# Patient Record
Sex: Female | Born: 1945 | Race: White | Hispanic: No | Marital: Married | Smoking: Former smoker
Health system: Southern US, Community
[De-identification: ages and names within clinical notes are randomized; demographics above are authoritative.]

## PROBLEM LIST (undated history)

## (undated) DIAGNOSIS — Z9911 Dependence on respirator [ventilator] status: Secondary | ICD-10-CM

## (undated) DIAGNOSIS — H409 Unspecified glaucoma: Secondary | ICD-10-CM

## (undated) DIAGNOSIS — J4489 Other specified chronic obstructive pulmonary disease: Secondary | ICD-10-CM

## (undated) DIAGNOSIS — Z8709 Personal history of other diseases of the respiratory system: Secondary | ICD-10-CM

## (undated) DIAGNOSIS — R0602 Shortness of breath: Secondary | ICD-10-CM

## (undated) DIAGNOSIS — I10 Essential (primary) hypertension: Secondary | ICD-10-CM

## (undated) DIAGNOSIS — J449 Chronic obstructive pulmonary disease, unspecified: Secondary | ICD-10-CM

## (undated) DIAGNOSIS — I4819 Other persistent atrial fibrillation: Secondary | ICD-10-CM

## (undated) DIAGNOSIS — H3530 Unspecified macular degeneration: Secondary | ICD-10-CM

## (undated) DIAGNOSIS — J189 Pneumonia, unspecified organism: Secondary | ICD-10-CM

## (undated) HISTORY — DX: Chronic obstructive pulmonary disease, unspecified: J44.9

## (undated) HISTORY — DX: Essential (primary) hypertension: I10

## (undated) HISTORY — DX: Other specified chronic obstructive pulmonary disease: J44.89

## (undated) HISTORY — DX: Other persistent atrial fibrillation: I48.19

---

## 1969-05-22 HISTORY — PX: DILATION AND CURETTAGE OF UTERUS: SHX78

## 1976-09-21 HISTORY — PX: VAGINAL HYSTERECTOMY: SUR661

## 1984-09-21 HISTORY — PX: OOPHORECTOMY: SHX86

## 1999-06-09 ENCOUNTER — Other Ambulatory Visit: Admission: RE | Admit: 1999-06-09 | Discharge: 1999-06-09 | Payer: Self-pay | Admitting: *Deleted

## 2000-08-16 ENCOUNTER — Other Ambulatory Visit: Admission: RE | Admit: 2000-08-16 | Discharge: 2000-08-16 | Payer: Self-pay | Admitting: *Deleted

## 2001-11-23 ENCOUNTER — Other Ambulatory Visit: Admission: RE | Admit: 2001-11-23 | Discharge: 2001-11-23 | Payer: Self-pay | Admitting: *Deleted

## 2003-03-19 ENCOUNTER — Other Ambulatory Visit: Admission: RE | Admit: 2003-03-19 | Discharge: 2003-03-19 | Payer: Self-pay | Admitting: *Deleted

## 2003-04-16 ENCOUNTER — Inpatient Hospital Stay (HOSPITAL_COMMUNITY): Admission: EM | Admit: 2003-04-16 | Discharge: 2003-04-27 | Payer: Self-pay | Admitting: Emergency Medicine

## 2003-04-16 ENCOUNTER — Encounter: Payer: Self-pay | Admitting: Emergency Medicine

## 2003-04-17 ENCOUNTER — Encounter (INDEPENDENT_AMBULATORY_CARE_PROVIDER_SITE_OTHER): Payer: Self-pay | Admitting: *Deleted

## 2003-04-20 ENCOUNTER — Encounter: Payer: Self-pay | Admitting: Thoracic Surgery

## 2003-04-20 ENCOUNTER — Encounter (INDEPENDENT_AMBULATORY_CARE_PROVIDER_SITE_OTHER): Payer: Self-pay | Admitting: Specialist

## 2003-04-24 ENCOUNTER — Encounter: Payer: Self-pay | Admitting: Pulmonary Disease

## 2004-03-25 ENCOUNTER — Other Ambulatory Visit: Admission: RE | Admit: 2004-03-25 | Discharge: 2004-03-25 | Payer: Self-pay | Admitting: *Deleted

## 2004-12-18 ENCOUNTER — Ambulatory Visit: Payer: Self-pay | Admitting: Critical Care Medicine

## 2004-12-23 ENCOUNTER — Ambulatory Visit: Payer: Self-pay | Admitting: Internal Medicine

## 2005-02-24 ENCOUNTER — Ambulatory Visit: Payer: Self-pay | Admitting: Internal Medicine

## 2005-07-06 ENCOUNTER — Ambulatory Visit: Payer: Self-pay | Admitting: Internal Medicine

## 2005-11-03 ENCOUNTER — Ambulatory Visit: Payer: Self-pay | Admitting: Internal Medicine

## 2006-05-04 ENCOUNTER — Ambulatory Visit: Payer: Self-pay | Admitting: Internal Medicine

## 2006-11-11 ENCOUNTER — Ambulatory Visit: Payer: Self-pay | Admitting: Internal Medicine

## 2007-03-23 ENCOUNTER — Ambulatory Visit: Payer: Self-pay | Admitting: Internal Medicine

## 2007-03-31 ENCOUNTER — Ambulatory Visit: Payer: Self-pay | Admitting: Internal Medicine

## 2007-04-03 ENCOUNTER — Ambulatory Visit: Payer: Self-pay | Admitting: Cardiovascular Disease

## 2007-04-03 ENCOUNTER — Ambulatory Visit: Payer: Self-pay | Admitting: Cardiology

## 2007-04-03 ENCOUNTER — Inpatient Hospital Stay (HOSPITAL_COMMUNITY): Admission: EM | Admit: 2007-04-03 | Discharge: 2007-04-11 | Payer: Self-pay | Admitting: Emergency Medicine

## 2007-04-03 ENCOUNTER — Ambulatory Visit: Payer: Self-pay | Admitting: Internal Medicine

## 2007-04-06 ENCOUNTER — Encounter (INDEPENDENT_AMBULATORY_CARE_PROVIDER_SITE_OTHER): Payer: Self-pay | Admitting: *Deleted

## 2007-04-06 ENCOUNTER — Encounter: Payer: Self-pay | Admitting: Internal Medicine

## 2007-04-18 ENCOUNTER — Ambulatory Visit: Payer: Self-pay | Admitting: Internal Medicine

## 2007-06-17 ENCOUNTER — Ambulatory Visit: Payer: Self-pay | Admitting: Internal Medicine

## 2007-09-05 ENCOUNTER — Telehealth: Payer: Self-pay | Admitting: Internal Medicine

## 2007-09-09 ENCOUNTER — Telehealth: Payer: Self-pay | Admitting: Internal Medicine

## 2007-09-12 ENCOUNTER — Ambulatory Visit: Payer: Self-pay | Admitting: Internal Medicine

## 2007-09-12 DIAGNOSIS — F17201 Nicotine dependence, unspecified, in remission: Secondary | ICD-10-CM

## 2007-09-12 DIAGNOSIS — J449 Chronic obstructive pulmonary disease, unspecified: Secondary | ICD-10-CM

## 2007-10-14 DIAGNOSIS — J9611 Chronic respiratory failure with hypoxia: Secondary | ICD-10-CM | POA: Insufficient documentation

## 2007-10-14 DIAGNOSIS — R942 Abnormal results of pulmonary function studies: Secondary | ICD-10-CM

## 2007-10-17 ENCOUNTER — Ambulatory Visit: Payer: Self-pay | Admitting: Internal Medicine

## 2008-01-08 ENCOUNTER — Emergency Department (HOSPITAL_COMMUNITY): Admission: EM | Admit: 2008-01-08 | Discharge: 2008-01-08 | Payer: Self-pay | Admitting: Family Medicine

## 2008-01-09 ENCOUNTER — Telehealth (INDEPENDENT_AMBULATORY_CARE_PROVIDER_SITE_OTHER): Payer: Self-pay | Admitting: *Deleted

## 2008-02-08 ENCOUNTER — Ambulatory Visit: Payer: Self-pay | Admitting: Internal Medicine

## 2008-03-24 ENCOUNTER — Telehealth: Payer: Self-pay | Admitting: Internal Medicine

## 2008-08-10 ENCOUNTER — Ambulatory Visit: Payer: Self-pay | Admitting: Internal Medicine

## 2008-09-10 ENCOUNTER — Telehealth (INDEPENDENT_AMBULATORY_CARE_PROVIDER_SITE_OTHER): Payer: Self-pay | Admitting: *Deleted

## 2008-10-19 ENCOUNTER — Encounter: Admission: RE | Admit: 2008-10-19 | Discharge: 2008-10-19 | Payer: Self-pay | Admitting: Family Medicine

## 2008-11-27 ENCOUNTER — Ambulatory Visit: Payer: Self-pay | Admitting: Internal Medicine

## 2008-12-06 IMAGING — CR DG CHEST 2V
2 series · 2 of 2 positions shown · non-contrast
Comparison: 05/04/06.

CLINICAL DATA: COPD.
 CHEST ? 2 VIEW:

[view not recorded (1 of 2)]
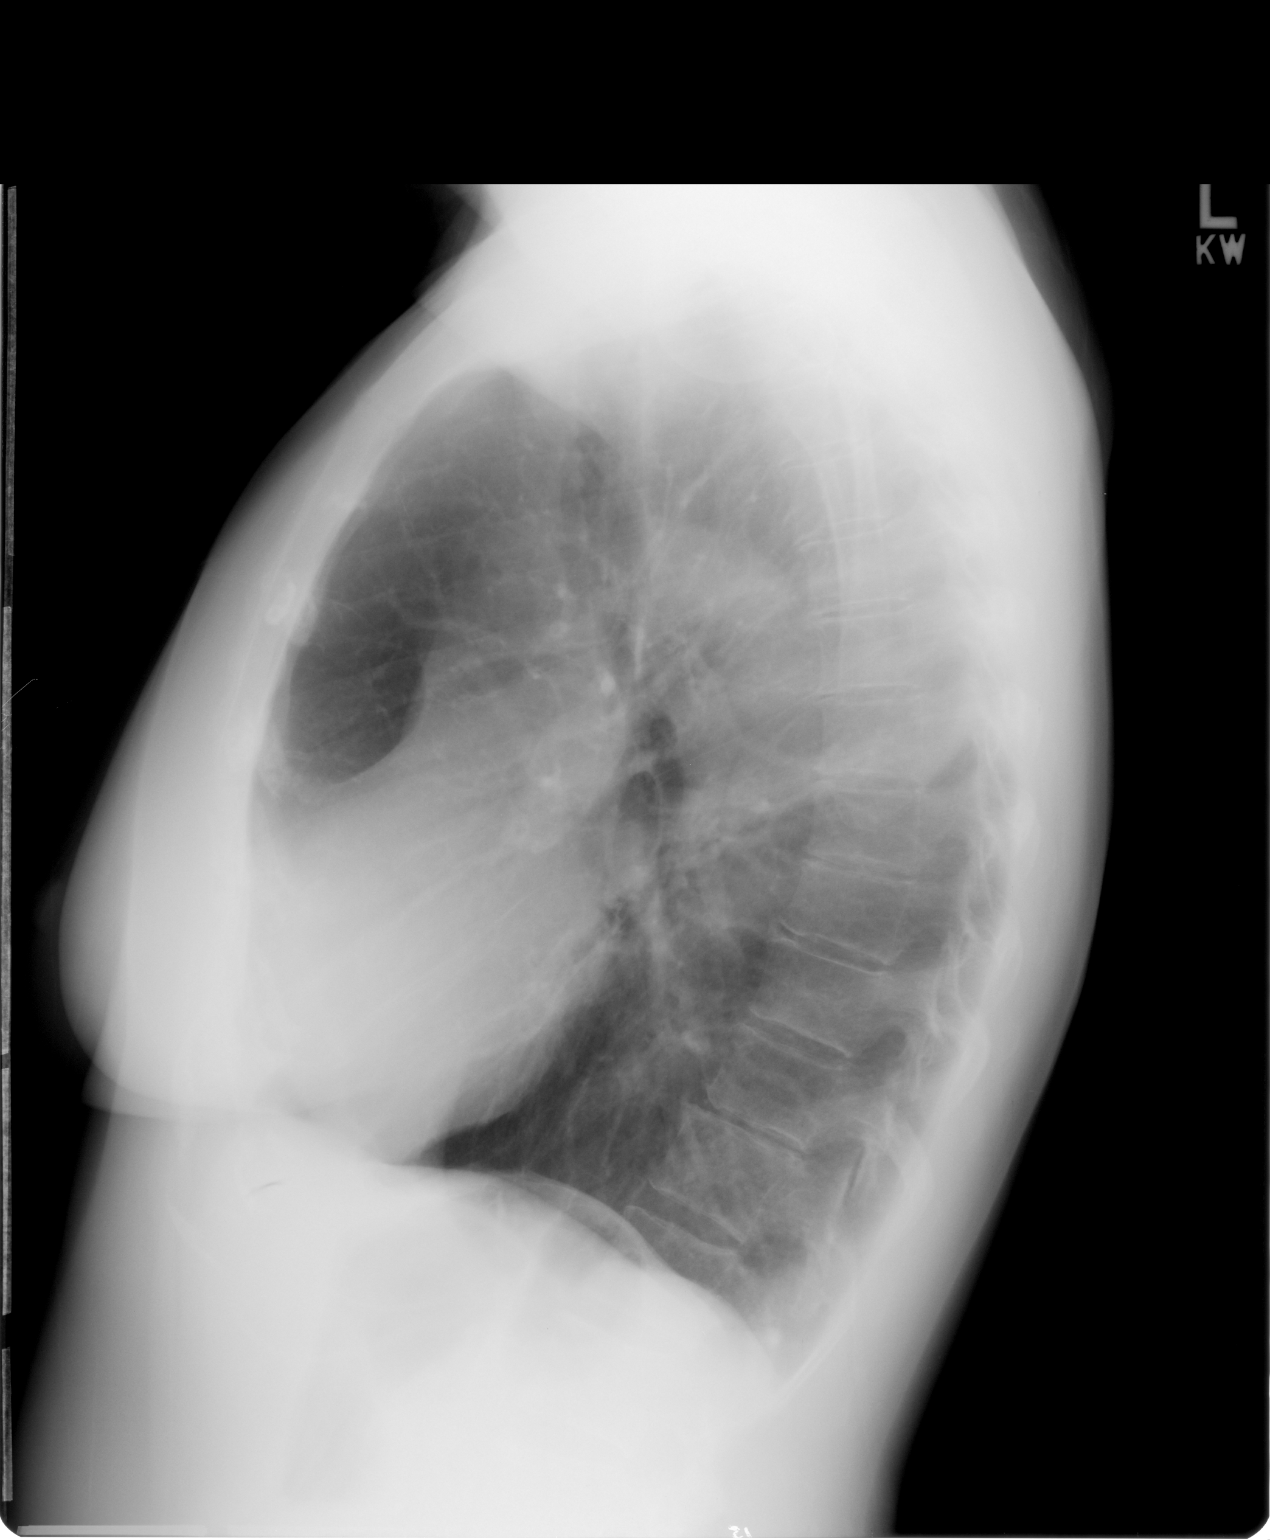

[view not recorded (2 of 2)]
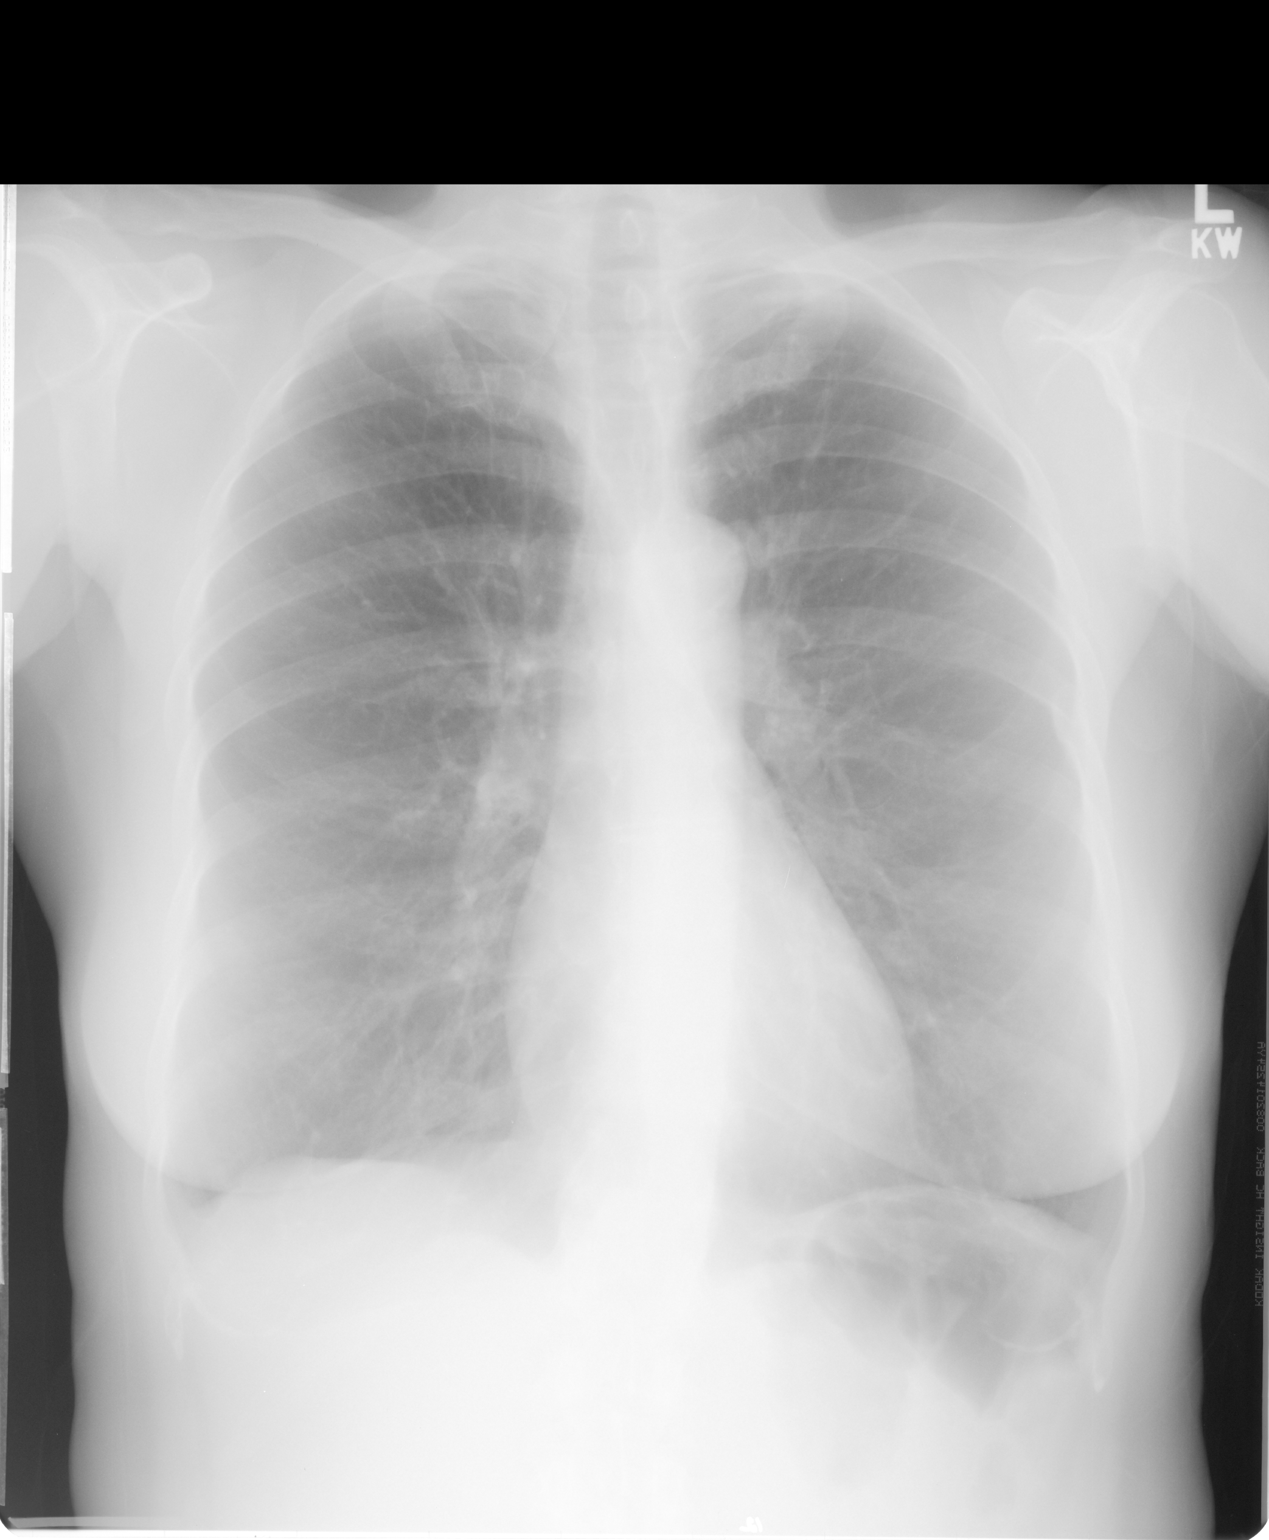

[2 of 2 positions shown; findings below may reference images not displayed]

FINDINGS: Stable changes of COPD.  No active process.  Heart is normal.  Old healed left seventh rib fracture again noted.
IMPRESSION: COPD ? no active disease.

## 2009-02-08 ENCOUNTER — Ambulatory Visit: Payer: Self-pay | Admitting: Internal Medicine

## 2009-02-08 LAB — CONVERTED CEMR LAB
Basophils Absolute: 0 10*3/uL (ref 0.0–0.1)
Eosinophils Absolute: 0.3 10*3/uL (ref 0.0–0.7)
Hemoglobin: 16.7 g/dL — ABNORMAL HIGH (ref 12.0–15.0)
Lymphocytes Relative: 26.9 % (ref 12.0–46.0)
MCHC: 36 g/dL (ref 30.0–36.0)
Neutro Abs: 5.9 10*3/uL (ref 1.4–7.7)
Platelets: 245 10*3/uL (ref 150.0–400.0)
RDW: 11.4 % — ABNORMAL LOW (ref 11.5–14.6)

## 2009-02-12 ENCOUNTER — Ambulatory Visit: Payer: Self-pay | Admitting: Internal Medicine

## 2009-02-12 ENCOUNTER — Telehealth (INDEPENDENT_AMBULATORY_CARE_PROVIDER_SITE_OTHER): Payer: Self-pay | Admitting: *Deleted

## 2009-03-21 ENCOUNTER — Ambulatory Visit: Payer: Self-pay | Admitting: Internal Medicine

## 2009-03-21 DIAGNOSIS — I10 Essential (primary) hypertension: Secondary | ICD-10-CM | POA: Insufficient documentation

## 2009-06-14 ENCOUNTER — Telehealth: Payer: Self-pay | Admitting: Internal Medicine

## 2009-06-17 ENCOUNTER — Ambulatory Visit: Payer: Self-pay | Admitting: Internal Medicine

## 2009-08-03 ENCOUNTER — Ambulatory Visit: Payer: Self-pay | Admitting: Internal Medicine

## 2009-08-03 ENCOUNTER — Inpatient Hospital Stay (HOSPITAL_COMMUNITY): Admission: EM | Admit: 2009-08-03 | Discharge: 2009-08-08 | Payer: Self-pay | Admitting: Emergency Medicine

## 2009-08-08 ENCOUNTER — Telehealth: Payer: Self-pay | Admitting: Internal Medicine

## 2009-08-09 ENCOUNTER — Telehealth: Payer: Self-pay | Admitting: Internal Medicine

## 2009-08-22 ENCOUNTER — Ambulatory Visit: Payer: Self-pay | Admitting: Internal Medicine

## 2009-08-23 ENCOUNTER — Telehealth (INDEPENDENT_AMBULATORY_CARE_PROVIDER_SITE_OTHER): Payer: Self-pay | Admitting: *Deleted

## 2009-09-25 ENCOUNTER — Telehealth: Payer: Self-pay | Admitting: Internal Medicine

## 2010-02-10 ENCOUNTER — Ambulatory Visit: Payer: Self-pay | Admitting: Internal Medicine

## 2010-02-14 ENCOUNTER — Telehealth (INDEPENDENT_AMBULATORY_CARE_PROVIDER_SITE_OTHER): Payer: Self-pay | Admitting: *Deleted

## 2010-05-08 ENCOUNTER — Telehealth: Payer: Self-pay | Admitting: Internal Medicine

## 2010-05-08 ENCOUNTER — Ambulatory Visit: Payer: Self-pay | Admitting: Internal Medicine

## 2010-05-09 ENCOUNTER — Telehealth (INDEPENDENT_AMBULATORY_CARE_PROVIDER_SITE_OTHER): Payer: Self-pay | Admitting: *Deleted

## 2010-06-10 ENCOUNTER — Ambulatory Visit: Payer: Self-pay | Admitting: Internal Medicine

## 2010-08-12 ENCOUNTER — Ambulatory Visit: Payer: Self-pay | Admitting: Internal Medicine

## 2010-09-11 ENCOUNTER — Telehealth (INDEPENDENT_AMBULATORY_CARE_PROVIDER_SITE_OTHER): Payer: Self-pay | Admitting: *Deleted

## 2010-10-02 ENCOUNTER — Telehealth (INDEPENDENT_AMBULATORY_CARE_PROVIDER_SITE_OTHER): Payer: Self-pay | Admitting: *Deleted

## 2010-10-03 ENCOUNTER — Ambulatory Visit
Admission: RE | Admit: 2010-10-03 | Discharge: 2010-10-03 | Payer: Self-pay | Source: Home / Self Care | Attending: Internal Medicine | Admitting: Internal Medicine

## 2010-10-14 ENCOUNTER — Ambulatory Visit: Admit: 2010-10-14 | Payer: Self-pay | Admitting: Internal Medicine

## 2010-10-21 NOTE — Progress Notes (Signed)
Summary: rx  Phone Note Call from Patient Call back at Home Phone [phone]   Caller: Patient Call For: young Reason for Call: Talk to Nurse Summary of Call: ? bronchitis, sob, coughing up plegm, thick clear, no fever.  Can you call something in? [facility] Initial call taken by: Eugene Gavia,  May 08, 2010 1:09 PM  Follow-up for Phone Call        called and spoke with pt and she is aware  of appt today with  MW at 4:15.

## 2010-10-21 NOTE — Assessment & Plan Note (Signed)
Summary: 4 months/apc   Primary Provider/Referring Provider:  [name]  CC:  Follow up , pt was given samples of benicar and dulera by Dr. Sherene Sires, and finished and pt started back on Advair and lisinopri;.  History of Present Illness: Feb 10, 2010- COPD, tobacco Last week had fever x 2 days. Had soreness left back for a couple of days, now easing off.   Occasional spells of coughing, less than before, but more over past 2 weeks.., She took aspirin, then ibuprofen. Uses Combivent about twice daily and contnues Advair. Denies nodes, rash, GI upset, new or different leg cramps, swelling, or blood. Denies hx DVT.  May 08, 2010 ov, still smoking  The patient c/o increased SOB and cough with thick clear to white mucus x1 week. never completely better from prev ov  now much worse not responding to rescue. Pt denies any significant sore throat, dysphagia, itching, sneezing,  nasal congestion or excess secretions,  fever, chills, sweats, unintended wt loss, pleuritic or exertional cp, hempoptysis,  orthopnea pnd or leg swelling   June 10, 2010- COPD, tobacco Saw Dr Sherene Sires in my absence in August for an acute visit. He changed her off lisnopril to Benicar samples and gave her Dulera 220-5 sample. She ran out of samples and went back on Lisinopril and Advair. she kept a cough through the Benicar sample without difference, but continued to smoke. She does notice and care about the cough. She feels better as she gets over the acute bronchitis. She is interested in trying Benicar and Dulera longer.     Preventive Screening-Counseling & Management  Alcohol-Tobacco     Smoking Status: current     Smoking Cessation Counseling: yes     Smoke Cessation Stage: precontemplative     Packs/Day: 1.0     Tobacco Counseling: to quit use of tobacco products  Current Medications (verified): 1)  Adult Aspirin Low Strength 81 Mg  Tbdp (Aspirin) .... Take 1 By Mouth Once Daily 2)  Celexa 20 Mg Tabs  (Citalopram Hydrobromide) .... Take 1 By Mouth Once Daily 3)  Combivent 103-18 Mcg/act Aero (Ipratropium-Albuterol) .... 2 Puffs Four Times A Day As Needed 4)  Lisinopril 20 Mg Tabs (Lisinopril) .Marland Kitchen.. 1 Once Daily 5)  Advair Diskus 250-50 Mcg/dose Aepb (Fluticasone-Salmeterol) .Marland Kitchen.. 1 Puff Two Times A Day  Allergies (verified): No Known Drug Allergies  Past History:  Past Medical History: Last updated: 05/08/2010 C O P D    - HFA 75% p coaching May 08, 2010  Bronchoscopy 04/06/07 for lavage of mucus plugs Acute ventilator dependent respiratory failure- copd exacerbation- 04/03/07 Hypertension   - Try off ACE May 08, 2010   Past Surgical History: Last updated: 10/17/2007 Hysterectomy Oophorectomy  Family History: Last updated: 02/28/2008 Mother liver cancer deceased Father emphysema deceased  Social History: Last updated: 06/17/2009 Patient is a current smoker.  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  The patient denies shortness of breath at rest, productive cough, coughing up blood, chest pain, irregular heartbeats, acid heartburn, indigestion, loss of appetite, weight change, abdominal pain, difficulty swallowing, sore throat, tooth/dental problems, headaches, nasal congestion/difficulty breathing through nose, and sneezing.  Dry cough wakes her and is worst on first rising.  Vital Signs:  Patient profile:   65 year old female Height:      62 inches Weight:      124.6 pounds BMI:     22.87 O2 Sat:      91 % on Room air Pulse rate:   86 / minute BP sitting:   138 / 76  (left arm)  Vitals Entered By: Renold Genta RCP, LPN (June 10, 2010 1:34 PM)  O2 Flow:   Room air CC: Follow up , pt was given samples of benicar and dulera by Dr. Sherene Sires, finished and pt started back on Advair and lisinopri; Comments Medications reviewed with patient Renold Genta RCP, LPN  June 10, 2010 1:35 PM    Physical Exam  Additional Exam:  SKIN: no rash, lesions NODES: no lymphadenopathy HEENT: [place]/AT, EOM- WNL, Conjuctivae- clear, PERRLA, TM-WNL, Nose- clear, Throat- clear and wnl, Mallampati  II, minimal geographic tongue NECK: Supple w/ fair ROM, JVD- none, normal carotid impulses w/o bruits Thyroid-  No stridor CHEST: distant w/ very minor wheeze across midback. 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 She continues to smoke. We again addressed the lisinopril issue and will let her try again with samples of Dulera and Benicar. Dr Gerri Spore treated for hyponatremia and changed Lisinopril HCT to plain Lisinopril. Flu vax discussed.  Problem # 2:  TOBACCO USER (ICD-305.1)  Encouraged to quit. She watched her husband die of tobacco disease, but can't make the step herself. Will remain supportive.  Orders: Est. Patient Level III (28413)  Medications Added to Medication List This Visit: 1)  Lisinopril 20 Mg Tabs (Lisinopril) .Marland Kitchen.. 1 once daily 2)  Advair Diskus 250-50 Mcg/dose Aepb (Fluticasone-salmeterol) .Marland Kitchen.. 1 puff two times a day  Other Orders: Flu Vaccine 70yrs + MEDICARE PATIENTS (K4401) Administration Flu vaccine - MCR (U2725)  Patient Instructions: 1)  Please schedule a follow-up appointment in 2 months. 2)  Try again to replace Lisinopril with Benicar 20 mg, 1 daily x 4 weeks. see if you notice a fall-off in the dry cough. 3)  Try again sample Dulera 200-5, 2 puffs and rinse mouth, twice daily. 4)  Flu vax     Flu Vaccine  Consent Questions     Do you have a history of severe allergic reactions to this vaccine? no    Any prior history of allergic reactions to egg and/or gelatin? no    Do you have a sensitivity to the preservative Thimersol? no    Do you have a past history of Guillan-Barre Syndrome? no    Do you currently have an acute febrile illness? no    Have you ever had a severe reaction to latex? no    Vaccine information given and explained to patient? yes    Are you currently pregnant? no    Lot Number:[id number]   Exp Date:03/21/2011   Site Given  Left Deltoid IMedflu  Boone Master CNA/MA  June 10, 2010 2:20 PM

## 2010-10-21 NOTE — Assessment & Plan Note (Signed)
Summary: rov 2 months///kp   Primary Provider/Referring Provider:  [name]  CC:  2 month follow up visit-COPD; using inhalers but still having SOB at times..  History of Present Illness: May 08, 2010 ov, still smoking  The patient c/o increased SOB and cough with thick clear to white mucus x1 week. never completely better from prev ov  now much worse not responding to rescue. Pt denies any significant sore throat, dysphagia, itching, sneezing,  nasal congestion or excess secretions,  fever, chills, sweats, unintended wt loss, pleuritic or exertional cp, hempoptysis,  orthopnea pnd or leg swelling   June 10, 2010- COPD, tobacco Saw Dr Sherene Sires in my absence in August for an acute visit. He changed her off lisnopril to Benicar samples and gave her Dulera 220-5 sample. She ran out of samples and went back on Lisinopril and Advair. she kept a cough through the Benicar sample without difference, but continued to smoke. She does notice and care about the cough. She feels better as she gets over the acute bronchitis. She is interested in trying Benicar and Dulera longer.   August 12, 2010- COPD, tobacco Nurse-CC: 2 month follow up visit-COPD; using inhalers but still having SOB at times. Morning cough with phlegm. Uses her Combivent about twice daily. She isn't sure Advair helps much- Dulera was no better. Combivent helps better than anything. Neb treatments don't help much.    Preventive Screening-Counseling & Management  Alcohol-Tobacco     Smoking Status: current     Smoking Cessation Counseling: yes     Smoke Cessation Stage: precontemplative     Packs/Day: 1.0     Year Started: 1975     Tobacco Counseling: to quit use of tobacco products  Current Medications (verified): 1)  Adult Aspirin Low Strength 81 Mg  Tbdp (Aspirin) .... Take 1 By Mouth Once Daily 2)  Combivent 103-18 Mcg/act Aero (Ipratropium-Albuterol) .... 2 Puffs Four Times A Day As Needed 3)  Lisinopril 20 Mg  Tabs (Lisinopril) .Marland Kitchen.. 1 Once Daily 4)  Advair Diskus 250-50 Mcg/dose Aepb (Fluticasone-Salmeterol) .Marland Kitchen.. 1 Puff Two Times A Day  Allergies (verified): No Known Drug Allergies  Past History:  Past Surgical History: Last updated: 10/17/2007 Hysterectomy Oophorectomy  Family History: Last updated: 02/11/08 Mother liver cancer deceased Father emphysema deceased  Social History: Last updated: 06/17/2009 Patient is a current smoker.  Husband was a smoker died - lung cancer She works as Lobbyist for a Customer service manager  Risk Factors: Smoking Status: current (08/12/2010) Packs/Day: 1.0 (08/12/2010)  Past Medical History: C Erskin Burnet D    - HFA 75% p coaching May 08, 2010  Bronchoscopy 04/06/07 for lavage of mucus plugs Acute ventilator dependent respiratory failure- copd exacerbation- 04/03/07 Hypertension   - Try off ACE May 08, 2010  Glaucoma  Review of Systems      See HPI       The patient complains of shortness of breath with activity and non-productive cough.  The patient denies shortness of breath at rest, productive cough, coughing up blood, chest pain, irregular heartbeats, acid heartburn, indigestion, loss of appetite, weight change, abdominal pain, difficulty swallowing, sore throat, tooth/dental problems, headaches, nasal congestion/difficulty breathing through nose, and sneezing.    Vital Signs:  Patient profile:   65 year old female Height:      62 inches Weight:      126.25 pounds BMI:     23.17 O2 Sat:      92 % on Room air Pulse rate:  99 / minute BP sitting:   122 / 74  (left arm) Cuff size:   regular  Vitals Entered By: Reynaldo Minium CMA (August 12, 2010 1:37 PM)  O2 Flow:  Room air CC: 2 month follow up visit-COPD; using inhalers but still having SOB at times.   Physical Exam  Additional Exam:  SKIN: no rash, lesions NODES: no lymphadenopathy HEENT: [place]/AT, EOM- WNL, Conjuctivae- clear, PERRLA, TM-WNL, Nose- clear, Throat- clear and wnl,  Mallampati  II,  NECK: Supple w/ fair ROM, JVD- none, normal carotid impulses w/o bruits Thyroid-  No stridor CHEST: distant , unlabored. Initial cough on arrival cleared with sips of water.  HEART: RRR, no m/g/r heard ABDOMEN trim ZOX:WRUE, nl pulses, no edema  NEURO: Grossly intact to observation      Impression & Recommendations:  Problem # 1:  C O P D (ICD-496) We are refilling xanax for anxiety. We will try oral albuterol tabs and we talked about this in detail. I hope we can get better drug delivery going through her bloodstream at times, to reach distal airways.   Problem # 2:  TOBACCO USER (ICD-305.1) She has not mad the effort to seriously attmept smoking cessation. We have discussed this again.  Problem # 3:  CT, CHEST, ABNORMAL (ICD-794.2)  Discussed last CXR in May. We will update.   Orders: Est. Patient Level IV (45409) T-2 View CXR (71020TC)  Medications Added to Medication List This Visit: 1)  Albuterol Sulfate 2 Mg Tabs (Albuterol sulfate) .Marland Kitchen.. 1 three times a day as needed 2)  Alprazolam 0.5 Mg Tabs (Alprazolam) .Marland Kitchen.. 1 three times a day as needed nervousness  Patient Instructions: 1)  Please schedule a follow-up appointment in 2 months. 2)  Try albuterol tabs as an extra help when chest is tight and breathing bad. 3)  Scripts for albuterol tabs and for alprazolam/ xanax 4)  A chest x-ray has been recommended.  Your imaging study may require preauthorization.  Prescriptions: ALPRAZOLAM 0.5 MG TABS (ALPRAZOLAM) 1 three times a day as needed nervousness  #50 x 5   Entered and Authorized by:   Waymon Budge MD   Signed by:   Waymon Budge MD on 08/12/2010   Method used:   Print then Give to Patient   RxID:   [id number] ALBUTEROL SULFATE 2 MG TABS (ALBUTEROL SULFATE) 1 three times a day as needed  #20 x prn   Entered and Authorized by:   Waymon Budge MD   Signed by:   Waymon Budge MD on 08/12/2010   Method used:   Print then Give to Patient    RxID:   [id number]

## 2010-10-21 NOTE — Assessment & Plan Note (Signed)
Summary: Pulmonary/ acute ov with hfa 75% p coaching   Visit Type:  Acute visit Primary Provider/Referring Provider:  [name]  CC:  Acute visit.  Dr. Maple Hudson patient.  The patient c/o increased SOB and cough with thick clear to white mucus x1 week.Marland Kitchen  History of Present Illness: 03/21/09- COPD, tobacco After last here, prednisone, antibiotics- she has done much better. Some nights her cough keeps her awake. Mostly dry cough without fever, chest pain, palpitation. Using combivent 2-3 x/ daily along with Advair. Has been on Lisinopril/ HCT  this brand since the winter We discussed substitution trial pending her return to Dr Gerri Spore. Very easily dyspneic with exertion.  06/17/09- COPD, tobacco Acute visit. Began nasal congestion and nose blowing 6 days ago. Progressive dyspnea over next few days.  Taking a sjower smothers her.Maybe one day of fever. Blowing out mostly clear from nose and chest. We called in augmentin and prednisone 3 days ago- still on those.   August 22, 2009- COPD, tobacco Post hosp f/u. Hosp for copd exacerb 11/13-11/18. She had some pneumonia cleared on cxr f/u. Discharged on Avelox, prednisone taper, and she was given some oxygen. She uses the oxygen still but off and on.  Hasn't smoked since hosp. Had flu vax. Back at work. CXR/CT had shown small area of apparent pneumonia sup seg LLL. On f/u cxr 11/17, just before discharge was no longer seen.  Feb 10, 2010- COPD, tobacco Last week had fever x 2 days. Had soreness left back for a couple of days, now easing off.   Occasional spells of coughing, less than before, but more over past 2 weeks.., She took aspirin, then ibuprofen. Uses Combivent about twice daily and contnues Advair. Denies nodes, rash, GI upset, new or different leg cramps, swelling, or blood. Denies hx DVT.  May 08, 2010 ov, still smoking  The patient c/o increased SOB and cough with thick clear to white mucus x1 week. never completely better from  prev ov  now much worse not responding to rescue. Pt denies any significant sore throat, dysphagia, itching, sneezing,  nasal congestion or excess secretions,  fever, chills, sweats, unintended wt loss, pleuritic or exertional cp, hempoptysis,  orthopnea pnd or leg swelling   Current Medications (verified): 1)  Advair Diskus 250-50 Mcg/dose  Misc (Fluticasone-Salmeterol) .... Inhale 1 Puff Two Times A Day 2)  Adult Aspirin Low Strength 81 Mg  Tbdp (Aspirin) .... Take 1 By Mouth Once Daily 3)  Combivent 103-18 Mcg/act Aero (Ipratropium-Albuterol) .... 2 Puffs Four Times A Day As Needed 4)  Lisinopril-Hydrochlorothiazide 20-12.5 Mg Tabs (Lisinopril-Hydrochlorothiazide) .... Take 1 By Mouth Once Daily 5)  Celexa 20 Mg Tabs (Citalopram Hydrobromide) .... Take 1 By Mouth Once Daily  Allergies (verified): No Known Drug Allergies  Past History:  Past Medical History: C Erskin Burnet D    - HFA 75% p coaching May 08, 2010  Bronchoscopy 04/06/07 for lavage of mucus plugs Acute ventilator dependent respiratory failure- copd exacerbation- 04/03/07 Hypertension   - Try off ACE May 08, 2010   Vital Signs:  Patient profile:   65 year old female Height:      62 inches (157.48 cm) Weight:      120.38 pounds (54.72 kg) BMI:     22.10 O2 Sat:      92 % on Room air Temp:     98.2 degrees F (36.78 degrees C) oral Pulse rate:   95 / minute BP sitting:   124 / 72  (  left arm) Cuff size:   regular  Vitals Entered By: Michel Bickers CMA (May 08, 2010 4:29 PM)  O2 Sat at Rest %:  92 O2 Flow:  Room air CC: Acute visit.  Dr. Maple Hudson patient.  The patient c/o increased SOB and cough with thick clear to white mucus x1 week. Comments Medications reviewed. Daytime phone verified. Michel Bickers CMA  May 08, 2010 4:29 PM   Physical Exam  Additional Exam:  amb anxious wf with congested sounding upper airway cough and classic pseudowheeze resolves with purse lip maneuver  SKIN: no rash, lesions NODES: no  lymphadenopathy HEENT: [place]/AT, EOM- WNL, Conjuctivae- clear, PERRLA, TM-WNL, Nose- clear, Throat- clear and wnl, Mallampati  II,  NECK: Supple w/ fair ROM, JVD- none, normal carotid impulses w/o bruits Thyroid-  No stridor CHEST: bilateral exp rhonchi with lots of transmitted upper airway "wheezing" HEART: RRR, no m/g/r heard ABDOMEN VHQ:IONG, nl pulses, no edema  NEURO: Grossly intact to observation      Impression & Recommendations:  Problem # 1:  C O P D (ICD-496) Never cleared after last ov now worse on ace and advair refractory to combivent   DDX of  difficult airways managment all start with A and  include Adherence, Ace Inhibitors, Acid Reflux, Active Sinus Disease, Alpha 1 Antitripsin deficiency, Anxiety masquerading as Airways dz,  ABPA,  allergy(esp in young), Aspiration (esp in elderly), Adverse effects of DPI,  Active smokers, plus one B  = Beta blocker use..    ? Ace see #2 ? Adverse effect of DPI > try  hfa.  I spent extra time with the patient today explaining optimal mdi  technique.  This improved from 50-75% so try dulera 200 x 1 month sample ?Active  Smoking see #3 ?Acid reflux: diet reviewed  Problem # 2:  HYPERTENSION (ICD-401.9)  . The following medications were removed from the medication list:    Lisinopril-hydrochlorothiazide 20-12.5 Mg Tabs (Lisinopril-hydrochlorothiazide) .Marland Kitchen... Take 1 by mouth once daily Her updated medication list for this problem includes:    Benicar 20 Mg Tabs (Olmesartan medoxomil) ..... One tablet by mouth daily   ACE inhibitors are problematic in  pts with airway complaints because  even experienced pulmonologists can't always distinguish ace effects from copd/asthma.  By themselves they don't actually cause a problem, much like oxygen can't by itself start a fire, but they certainly serve as a powerful catalyst or enhancer for any "fire"  or inflammatory process in the upper airway, be it caused by an ET  tube or more commonly reflux  (especially in the obese or pts with known GERD or who are on biphoshonates).  In the era of ARB near equivalency until we have a better handle on the reversibility of the airway problem, it just makes sense to avoid ace entirely in the short run and then decide later, having established a level of airway control using a reasonable limited regimen, whether to add back ace but even then being very careful to observe the pt for worsening airway control and number of meds used/ needed to control symptoms.    Orders: Est. Patient Level IV (29528)  Problem # 3:  TOBACCO USER (ICD-305.1)   I emphasized that although we never turn away smokers from the pulmonary clinic, we do ask that they understand that the recommendations that were made won't work nearly as well in the presence of continued cigarette exposure and we may reach a point where we can't help the patient if he/she  can't quit smoking.     I took this opportunity to emphasize to the pt  the consequences of smoking in airway disorders based on all the data we have from the multiple national lung health studies indicating that smoking cessation, not choice of inhalers or physicians, is the most important aspect of care.    Orders: Est. Patient Level IV (08657)  Medications Added to Medication List This Visit: 1)  Benicar 20 Mg Tabs (Olmesartan medoxomil) .... One tablet by mouth daily 2)  Dulera 200-5 Mcg/act Aero (Mometasone furo-formoterol fum) .... 2 puffs first thing  in am and 2 puffs again in pm about 12 hours later  Patient Instructions: 1)  Prednisone 4 each am x 2days, 2x2days, 1x2days and stop  2)  Stop advair 3)  Stop Lisnopril 4)  Dulera 200 2 puffs first thing  in am and 2 puffs again in pm about 12 hours later  5)  GERD (REFLUX)  is a common cause of respiratory symptoms. It commonly presents without heartburn and can be treated with medication, but also with lifestyle changes including avoidance of late meals, excessive  alcohol, smoking cessation, and avoid fatty foods, chocolate, peppermint, colas, red wine, and acidic juices such as orange juice. NO MINT OR MENTHOL PRODUCTS SO NO COUGH DROPS  6)  USE SUGARLESS CANDY INSTEAD (jolley ranchers/  7)  NO OIL BASED VITAMINS  8)  See Dr Maple Hudson in 4 weeks to check your response to these medication changes and PFT's  9)  NEEDS CXR ON RETURN 55  Appended Document: Pulmonary/ acute ov with hfa 75% p coaching Chart reviewed after she left indicating cxr needed to f/u last ov - see if she can come in for this today if not be sure one is done at the next ov  Appended Document: Pulmonary/ acute ov with hfa 75% p coaching called and spoke with pt and pt states she would like to wait until she comes back ins eptember to have a cxr done with her apt with Dr. Maple Hudson.

## 2010-10-21 NOTE — Progress Notes (Signed)
Summary: Prednisone x 10 mg x 14 pills taper off  Phone Note Call from Patient   Caller: Patient Call For: WERT Summary of Call: WOULD LIKE PREDNISONE CALLED TO PHARMACY FOR COUGH AND SOB [facility] Initial call taken by: Rickard Patience,  May 09, 2010 3:19 PM  Follow-up for Phone Call        Dr. Sherene Sires, per the OV note from yesterday patient is to have a pred taper but it was not sent.   would just like to verifiy the dosing.  prednisone 10mg  4 each am x 2days, 2x2days, 1x2days and stop, #14 no refills.  thanks! Follow-up by: Boone Master CNA/MA,  May 09, 2010 3:27 PM  Additional Follow-up for Phone Call Additional follow up Details #1::        that is correct Additional Follow-up by: Nyoka Cowden MD,  May 09, 2010 3:36 PM    Additional Follow-up for Phone Call Additional follow up Details #2::    RX sent and Mindy is making pt aware.Reynaldo Minium CMA  May 09, 2010 3:54 PM   New/Updated Medications: PREDNISONE 10 MG TABS (PREDNISONE) take 4 each am x 2 days, 2 each am x 2 days, 1 each am x 2 days Prescriptions: PREDNISONE 10 MG TABS (PREDNISONE) take 4 each am x 2 days, 2 each am x 2 days, 1 each am x 2 days  #14 x 0   Entered by:   Reynaldo Minium CMA   Authorized by:   Nyoka Cowden MD   Signed by:   Reynaldo Minium CMA on 05/09/2010   Method used:   Electronically to        [facility]  [phone]* (retail)       [address]       [place], [place]  [address]       Ph: [phone] or [phone]       Fax: [phone]   RxID:   [id number]

## 2010-10-21 NOTE — Progress Notes (Signed)
Summary: sick-LMCTB  Phone Note Call from Patient Call back at Work Phone [phone] Call back at [phone]   Caller: Patient Call For: Perfecto Purdy Summary of Call: pt is wanting a antibodic called in for a cold she just got out of the hosp 11/18 [facility] [address] Initial call taken by: Lacinda Axon,  September 25, 2009 12:16 PM  Follow-up for Phone Call        [facility]. Carron Curie CMA  September 25, 2009 12:39 PM  Pt c/o nasal congestion, sinus pressure, productive cough with yellow phlegm x 2 days. Please advise. Allergies: NKDA.Carron Curie CMA  September 25, 2009 3:11 PM   called and spoke with pt  and she is aware per CY that we will send into her pharmacy. Randell Loop CMA  September 25, 2009 3:54 PM     New/Updated Medications: DOXYCYCLINE HYCLATE 100 MG TABS (DOXYCYCLINE HYCLATE) take 2 tablets by mouth today then 1 daily until gone. Prescriptions: DOXYCYCLINE HYCLATE 100 MG TABS (DOXYCYCLINE HYCLATE) take 2 tablets by mouth today then 1 daily until gone.  #8 x 0   Entered by:   Randell Loop CMA   Authorized by:   Waymon Budge MD   Signed by:   Randell Loop CMA on 09/25/2009   Method used:   Electronically to        [facility]  [phone]* (retail)       [address]       [place], [place]  [address]       Ph: [phone] or [phone]       Fax: [phone]   RxID:   [id number]

## 2010-10-21 NOTE — Progress Notes (Signed)
Summary: cxr results- possible PNA, tx with zithromax  Phone Note Call from Patient   Caller: Patient Call For: young Summary of Call: calling for xray results Initial call taken by: Rickard Patience,  Feb 14, 2010 11:02 AM  Follow-up for Phone Call        CXR- possible pneumonia in left lung.  Recommend- Go ahead and take the Z pak script I gave yesterday. If not feeling better in a few days be sure to let me know.   Signed by Waymon Budge MD on 02/11/2010 at 9:08 AM  The above is append on cxr.  ATC pt x 2 and line was busy, WCB Vernie Murders  Feb 14, 2010 11:18 AM   Additional Follow-up for Phone Call Additional follow up Details #1::        Spoke with pt and advised of the above results/recs per Dr Maple Hudson.  Pt verbalized understanding and will go ahead and get zpack. Additional Follow-up by: Vernie Murders,  Feb 14, 2010 11:21 AM

## 2010-10-21 NOTE — Assessment & Plan Note (Signed)
Summary: rov 6 months///kp   Primary Provider/Referring Provider:  Carolin Ibarra  CC:  6 month follow up visit-COPD; "breathing okay and have good and bad days lately".Fever couple times last week and pain in Left upper back since Tuesday last week-pain with deep breathing. Marland Kitchen  History of Present Illness: 03/21/09- COPD, tobacco After last here, prednisone, antibiotics- she has done much better. Some nights her cough keeps her awake. Mostly dry cough without fever, chest pain, palpitation. Using combivent 2-3 x/ daily along with Advair. Has been on Lisinopril/ HCT  this brand since the winter We discussed substitution trial pending her return to Dr Gerri Spore. Very easily dyspneic with exertion.  06/17/09- COPD, tobacco Acute visit. Began nasal congestion and nose blowing 6 days ago. Progressive dyspnea over next few days.  Taking a sjower smothers her.Maybe one day of fever. Blowing out mostly clear from nose and chest. We called in augmentin and prednisone 3 days ago- still on those.   August 22, 2009- COPD, tobacco Post hosp f/u. Hosp for copd exacerb 11/13-11/18. She had some pneumonia cleared on cxr f/u. Discharged on Avelox, prednisone taper, and she was given some oxygen. She uses the oxygen still but off and on.  Hasn't smoked since hosp. Had flu vax. Back at work. CXR/CT had shown small area of apparent pneumonia sup seg LLL. On f/u cxr 11/17, just before discharge was no longer seen.  Feb 10, 2010- COPD, tobacco Last week had fever x 2 days. Had soreness left back for a couple of days, now easing off.   Occasional spells of coughing, less than before, but more over past 2 weeks.., She took aspirin, then ibuprofen. Uses Combivent about twice daily and contnues Advair. Denies nodes, rash, GI upset, new or different leg cramps, swelling, or blood. Denies hx DVT.  Current Medications (verified): 1)  Advair Diskus 250-50 Mcg/dose  Misc (Fluticasone-Salmeterol) .... Inhale 1 Puff Two  Times A Day 2)  Adult Aspirin Low Strength 81 Mg  Tbdp (Aspirin) .... Take 1 By Mouth Once Daily 3)  Combivent 103-18 Mcg/act Aero (Ipratropium-Albuterol) .... 2 Puffs Four Times A Day As Needed 4)  Lisinopril-Hydrochlorothiazide 20-12.5 Mg Tabs (Lisinopril-Hydrochlorothiazide) .... Take 1 By Mouth Once Daily 5)  Celexa 20 Mg Tabs (Citalopram Hydrobromide) .... Take 1 By Mouth Once Daily  Allergies (verified): No Known Drug Allergies  Past History:  Past Medical History: Last updated: 03/21/2009 C O P D Bronchoscopy 7/16/08for lavage of mucus plugs Acute ventilator dependent respiratory failure- copd exacerbation- 04/03/07 Hypertension  Past Surgical History: Last updated: 10/17/2007 Hysterectomy Oophorectomy  Family History: Last updated: 2008-02-10 Mother liver cancer deceased Father emphysema deceased  Social History: Last updated: 06/17/2009 Patient is a current smoker.  Husband was a smoker died - lung cancer She works as Lobbyist for a Customer service manager  Risk Factors: Smoking Status: current (03/21/2009) Packs/Day: 0.5 (03/21/2009)  Review of Systems      See HPI       The patient complains of shortness of breath with activity and productive cough.  The patient denies shortness of breath at rest, non-productive cough, coughing up blood, chest pain, irregular heartbeats, acid heartburn, indigestion, loss of appetite, weight change, abdominal pain, difficulty swallowing, sore throat, tooth/dental problems, headaches, nasal congestion/difficulty breathing through nose, and sneezing.    Vital Signs:  Patient profile:   66 year old female Height:      62 inches Weight:      125 pounds BMI:     22.95 O2  Sat:      95 % on Room air Pulse rate:   84 / minute BP sitting:   118 / 66  (left arm) Cuff size:   regular  Vitals Entered By: Reynaldo Minium CMA (Feb 10, 2010 2:27 PM)  O2 Flow:  Room air  Physical Exam  Additional Exam:  General: A/Ox3; pleasant and  cooperative, NAD, Resting saturation is 97%. SKIN: no rash, lesions NODES: no lymphadenopathy HEENT: [place]/AT, EOM- WNL, Conjuctivae- clear, PERRLA, TM-WNL, Nose- clear, Throat- clear and wnl, Mallampati  II,  NECK: Supple w/ fair ROM, JVD- none, normal carotid impulses w/o bruits Thyroid-  No stridor CHEST:clear, quiet and unlabored today. Mild shock tenederness left infrascapular area with no rales, rub or dullness HEART: RRR, no m/g/r heard ABDOMEN EAV:WUJW, nl pulses, no edema  NEURO: Grossly intact to observation      Impression & Recommendations:  Problem # 1:  C O P D (ICD-496) Acute bronchitis exacerbation- nonspecific. She will continue ibuprofen and heating pad as needed. We will give Z pak to hold. Tussive musculoskeletal back pain. Plan CXR  Problem # 2:  TOBACCO USER (ICD-305.1)  Averaging still about a pack per day. Encouraged again to stop. The following medications were removed from the medication list:    Nicotine 21 Mg/24hr Pt24 (Nicotine) .Marland Kitchen... Transderm daily, remove at night  Medications Added to Medication List This Visit: 1)  Lisinopril-hydrochlorothiazide 20-12.5 Mg Tabs (Lisinopril-hydrochlorothiazide) .... Take 1 by mouth once daily 2)  Zithromax Z-pak 250 Mg Tabs (Azithromycin) .... 2 today then one daily  Other Orders: Est. Patient Level IV (11914) Tobacco use cessation intermediate 3-10 minutes (78295) Prescription Created Electronically [phone]) T-2 View CXR (71020TC)  Patient Instructions: 1)  Please schedule a follow-up appointment in 4 months. 2)  A chest x-ray has been recommended.  Your imaging study may require preauthorization.  3)  heating pad and ibuprofen as needed for back pain 4)  Script to hold for Z pak in case you need antibiotic 5)  Refills sent for Advair and Combivent 6)  Please try to stop smoking. Make that your present to yourself. Prescriptions: ADVAIR DISKUS 250-50 MCG/DOSE  MISC (FLUTICASONE-SALMETEROL) Inhale 1 puff two  times a day  #60 Each x prn   Entered and Authorized by:   Waymon Budge MD   Signed by:   Waymon Budge MD on 02/10/2010   Method used:   Electronically to        [facility]  [phone]* (retail)       [address]       [place], [place]  [address]       Ph: [phone] or [phone]       Fax: [phone]   RxID:   [id number] COMBIVENT 103-18 MCG/ACT AERO (IPRATROPIUM-ALBUTEROL) 2 puffs four times a day as needed  #1 x prn   Entered and Authorized by:   Waymon Budge MD   Signed by:   Waymon Budge MD on 02/10/2010   Method used:   Electronically to        [facility]  [phone]* (retail)       [address]       [place], [place]  [address]       Ph: [phone] or [phone]       Fax: [phone]   RxID:   [id number] ZITHROMAX Z-PAK 250 MG  TABS (AZITHROMYCIN) 2 today then one daily  #1 pak x 0   Entered and Authorized by:   Waymon Budge MD   Signed by:   Waymon Budge MD on 02/10/2010   Method used:   Print then Give to Patient   RxID:   [id number]

## 2010-10-23 NOTE — Assessment & Plan Note (Signed)
Summary: acute visit/kcw   Primary Provider/Referring Provider:  [name]  CC:  Acute visit0-cough, Increased SOB, and was having some right side Back pain.Marland Kitchen  History of Present Illness: June 10, 2010- COPD, tobacco Saw Dr Sherene Sires in my absence in August for an acute visit. He changed her off lisnopril to Benicar samples and gave her Dulera 220-5 sample. She ran out of samples and went back on Lisinopril and Advair. she kept a cough through the Benicar sample without difference, but continued to smoke. She does notice and care about the cough. She feels better as she gets over the acute bronchitis. She is interested in trying Benicar and Dulera longer.   August 12, 2010- COPD, tobacco Nurse-CC: 2 month follow up visit-COPD; using inhalers but still having SOB at times. Morning cough with phlegm. Uses her Combivent about twice daily. She isn't sure Advair helps much- Dulera was no better. Combivent helps better than anything. Neb treatments don't help much.   October 03, 2010- COPD, tobacco Nurse-CC: Acute visit0-cough, Increased SOB, was having some right side Back pain. CXR 07/2010 showed clear COPD w/ reesolution of earlier pneumonia. We called in Z pak late December Acute visit- Starting 6 days ago malaise, fever, cough. Went to [facility]. Got Ceftin , Hycodan CS. Fever stopped 4 days ago. Cough remains very productive thick white, short of breath. Started prednisone taper 10/02/10. Sleeping in recliner. Had right sided back pain, now gone. Took mucinex 2 days.    Preventive Screening-Counseling & Management  Alcohol-Tobacco     Smoking Status: current     Smoking Cessation Counseling: yes     Smoke Cessation Stage: precontemplative     Packs/Day: 1.0     Year Started: 1975     Tobacco Counseling: to quit use of tobacco products  Current Medications (verified): 1)  Adult Aspirin Low Strength 81 Mg  Tbdp (Aspirin) .... Take 1 By Mouth Once Daily 2)  Combivent  103-18 Mcg/act Aero (Ipratropium-Albuterol) .... 2 Puffs Four Times A Day As Needed 3)  Lisinopril 20 Mg Tabs (Lisinopril) .Marland Kitchen.. 1 Once Daily 4)  Advair Diskus 250-50 Mcg/dose Aepb (Fluticasone-Salmeterol) .Marland Kitchen.. 1 Puff Two Times A Day 5)  Alprazolam 0.5 Mg Tabs (Alprazolam) .Marland Kitchen.. 1 Three Times A Day As Needed Nervousness 6)  Prednisone 10 Mg Tabs (Prednisone) .... Take 4 Tablets X 2 Days, 3 Tablets X 2 Days, 2 Tablets X 2days, 1 Tablet X 2 Days, Then Stop  Allergies (verified): No Known Drug Allergies  Past History:  Past Medical History: Last updated: 08/12/2010 C O P D    - HFA 75% p coaching May 08, 2010  Bronchoscopy 04/06/07 for lavage of mucus plugs Acute ventilator dependent respiratory failure- copd exacerbation- 04/03/07 Hypertension   - Try off ACE May 08, 2010  Glaucoma  Past Surgical History: Last updated: 10/17/2007 Hysterectomy Oophorectomy  Family History: Last updated: 22-Feb-2008 Mother liver cancer deceased Father emphysema deceased  Social History: Last updated: 10/03/2010 Patient is a current smoker.  Husband was a smoker died - lung cancer Retired  Lobbyist for a Customer service manager  Risk Factors: Smoking Status: current (10/03/2010) Packs/Day: 1.0 (10/03/2010)  Social History: Patient is a current smoker.  Husband was a smoker died - lung cancer Retired  Lobbyist for a Customer service manager  Review of Systems      See HPI       The patient complains of shortness of breath with activity, productive cough, and chest pain.  The patient denies shortness of  breath at rest, non-productive cough, coughing up blood, irregular heartbeats, acid heartburn, indigestion, loss of appetite, weight change, abdominal pain, difficulty swallowing, sore throat, tooth/dental problems, headaches, nasal congestion/difficulty breathing through nose, and sneezing.    Vital Signs:  Patient profile:   65 year old female Height:      62 inches Weight:      123.25  pounds BMI:     22.62 O2 Sat:      97 % on Room air Pulse rate:   86 / minute BP sitting:   138 / 88  (left arm) Cuff size:   regular  Vitals Entered By: Reynaldo Minium CMA (October 03, 2010 11:10 AM)  O2 Flow:  Room air CC: Acute visit0-cough, Increased SOB, was having some right side Back pain.   Physical Exam  Additional Exam:  SKIN: no rash, lesions NODES: no lymphadenopathy HEENT: [place]/AT, EOM- WNL, Conjuctivae- clear, PERRLA, TM-WNL, Nose- clear, Throat- clear and wnl, Mallampati  II,  NECK: Supple w/ fair ROM, JVD- none, normal carotid impulses w/o bruits Thyroid-  No stridor CHEST: distant , unlabored. Initial cough on arrival cleared with sips of water.  HEART: RRR, no m/g/r heard ABDOMEN trim ZOX:WRUE, nl pulses, no edema  NEURO: Grossly intact to observation      Impression & Recommendations:  Problem # 1:  C O P D (ICD-496) Acute exacerbation Suggested continue pred taper  Add doxy while finsishing the ceftin  neb today  Consider home neb  Problem # 2:  TOBACCO USER (ICD-305.1) We again took the opportunity to push her for some effort at smoking cessation.  Medications Added to Medication List This Visit: 1)  Doxycycline Hyclate 100 Mg Caps (Doxycycline hyclate) .... 2 today then one daily  Other Orders: Est. Patient Level III (45409) Albuterol Sulfate Sol 1mg  unit dose (W1191) Nebulizer Tx (47829)  Patient Instructions: 1)  Keep appointment as scheduled, but call sooner as needed.  2)  Script sent for doxycycline antibiotic. Start this on top of the cefuroxime antibiotic you are already taking. Suggest you take them after meals.  3)  neb a today 4)  Finish prednisone taper 5)  Lots of fluids and rest as much as you can.  Prescriptions: DOXYCYCLINE HYCLATE 100 MG CAPS (DOXYCYCLINE HYCLATE) 2 today then one daily  #8 x 0   Entered and Authorized by:   Waymon Budge MD   Signed by:   Waymon Budge MD on 10/03/2010   Method used:   Electronically  to        [facility]  [phone]* (retail)       [address]       [place], [place]  [address]       Ph: [phone] or [phone]       Fax: [phone]   RxID:   [id number]      Medication Administration  Medication # 1:    Medication: Albuterol Sulfate Sol 1mg  unit dose    Diagnosis: C O P D (ICD-496)    Dose: 1 vial    Route: inhaled    Exp Date: 02/13    Lot #: [id number]    Mfr: [employer]    Patient tolerated medication without complications    Given by: Zackery Barefoot CMA (October 03, 2010 12:07 PM)  Orders Added: 1)  Est. Patient Level III [84166] 2)  Albuterol Sulfate Sol 1mg  unit dose [A6301] 3)  Nebulizer Tx [  94640]  

## 2010-10-23 NOTE — Progress Notes (Signed)
Summary: requesting abx asap----rx for z pak   Phone Note Call from Patient Call back at [facility] Phone [phone] Call back at DR. YOUNG   Caller: Patient Summary of Call: Patient phoned and wanted to know if Dr. Maple Hudson would call her an antibiotic into [facility] [address] [phone]. She is wheezing and coughing up mucas. Patient can be reached at [phone] Initial call taken by: Vedia Coffer,  September 11, 2010 9:15 AM  Follow-up for Phone Call        Called, spoke with pt.  c/o wheezing, prod cough with thick white mucus and some yellow mixed in, some chest tightness, and increased SOB at rest and with activity.  Denies f/c/s.  [facility] [address] nkda  Requesting abx ASAP.  Dr. Maple Hudson, pls advise.  Thanks! Follow-up by: Gweneth Dimitri RN,  September 11, 2010 11:16 AM  Additional Follow-up for Phone Call Additional follow up Details #1::        per Cy, offer z pak.  Marland KitchenMarland KitchenMarland KitchenAundra Millet Reynolds LPN  September 11, 2010 11:38 AM   called and spoke with pt.  pt aware rx sent to pharmacy. Marland KitchenArman Filter LPN  September 11, 2010 11:39 AM     New/Updated Medications: ZITHROMAX Z-PAK 250 MG TABS (AZITHROMYCIN) take as directed Prescriptions: ZITHROMAX Z-PAK 250 MG TABS (AZITHROMYCIN) take as directed  #1 x 0   Entered by:   Arman Filter LPN   Authorized by:   Waymon Budge MD   Signed by:   Arman Filter LPN on 57/84/6962   Method used:   Electronically to        [facility]  [phone]* (retail)       [address]       [place], [place]  [address]       Ph: [phone] or [phone]       Fax: [phone]   RxID:   [id number]

## 2010-10-23 NOTE — Progress Notes (Signed)
Summary: appointmnet today  Phone Note Call from Patient Call back at Home Phone [phone]   Caller: Patient Call For: Dr. Maple Hudson Summary of Call: Patient phoned and thinks that she may have pneumonia she went to the [facility] clinic on Saturday and was given an antibiotic but she isn't any better. She is having a hard time breathing. She doesn't have a fever but doesn't think the antibiotic is doing any good. She would like to be seen today. She can be reached [phone] Initial call taken by: Vedia Coffer,  October 02, 2010 9:02 AM  Follow-up for Phone Call        Pt seen at [facility] clinic on Sat 09-27-10.  Given Ceftin 500 mg two times a day and Hycodan cough syrup.  Pt still c/o constant cough with thick white sputum, increased SOB, back hurting on right side, diff sleeping due to cough.  Fever is gone now but was running 103.  Not taking Mucinex or Hycodan now because she did not want to mask any symptoms becaused she has been hospitalized for mucus plugs in the past.  Please advise. Abigail Miyamoto RN  October 02, 2010 9:48 AM   Additional Follow-up for Phone Call Additional follow up Details #1::        Per CDY-worked in on Friday 10-03-10 at 1045am for acute vist and called Prednisone 10mg  #20 4 x 2days, 3 x 2 days, 2 x 2days, 1 x 2 days then stop no refills to [facility] [address] at patients request.Katie [facility] CMA  October 02, 2010 11:50 AM     New/Updated Medications: PREDNISONE 10 MG TABS (PREDNISONE) take 4 tablets x 2 days, 3 tablets x 2 days, 2 tablets x 2days, 1 tablet x 2 days, then stop Prescriptions: PREDNISONE 10 MG TABS (PREDNISONE) take 4 tablets x 2 days, 3 tablets x 2 days, 2 tablets x 2days, 1 tablet x 2 days, then stop  #20 x 0   Entered by:   Reynaldo Minium CMA   Authorized by:   Waymon Budge MD   Signed by:   Reynaldo Minium CMA on 10/02/2010   Method used:   Electronically to        [facility]  [phone]* (retail)       [address]  [place], [place]  [address]       Ph: [phone] or [phone]       Fax: [phone]   RxID:   [id number]

## 2010-12-09 ENCOUNTER — Encounter: Payer: Self-pay | Admitting: Internal Medicine

## 2010-12-16 ENCOUNTER — Ambulatory Visit: Payer: Self-pay | Admitting: Internal Medicine

## 2010-12-19 ENCOUNTER — Encounter: Payer: Self-pay | Admitting: Internal Medicine

## 2010-12-19 ENCOUNTER — Ambulatory Visit (INDEPENDENT_AMBULATORY_CARE_PROVIDER_SITE_OTHER): Payer: Medicare Other | Admitting: Internal Medicine

## 2010-12-19 VITALS — BP 116/62 | HR 87 | Ht 61.0 in | Wt 119.6 lb

## 2010-12-19 DIAGNOSIS — J449 Chronic obstructive pulmonary disease, unspecified: Secondary | ICD-10-CM

## 2010-12-19 DIAGNOSIS — F17200 Nicotine dependence, unspecified, uncomplicated: Secondary | ICD-10-CM

## 2010-12-19 DIAGNOSIS — R942 Abnormal results of pulmonary function studies: Secondary | ICD-10-CM

## 2010-12-19 MED ORDER — BUDESONIDE-FORMOTEROL FUMARATE 160-4.5 MCG/ACT IN AERO: 2.0000 | INHALATION_SPRAY | Freq: Two times a day (BID) | RESPIRATORY_TRACT | Status: DC

## 2010-12-19 MED ORDER — LEVALBUTEROL HCL 0.63 MG/3ML IN NEBU
0.6300 mg | INHALATION_SOLUTION | Freq: Once | RESPIRATORY_TRACT | Status: AC
  Administered 2010-12-19: 0.63 mg via RESPIRATORY_TRACT

## 2010-12-19 MED ORDER — METHYLPREDNISOLONE ACETATE 80 MG/ML IJ SUSP
80.0000 mg | Freq: Once | INTRAMUSCULAR | Status: AC
  Administered 2010-12-19: 80 mg via INTRAMUSCULAR

## 2010-12-19 MED ORDER — CEFDINIR 300 MG PO CAPS: 300.0000 mg | ORAL_CAPSULE | Freq: Two times a day (BID) | ORAL | Status: AC

## 2010-12-19 NOTE — Patient Instructions (Addendum)
Neb xop 0.63  Depo 80  Sample and script Symbicort 160 to try instead of Advair   2 puffs and rinse mouth, twice daily  Script to hold for antibiotic cefdinir. Use this if you aren't getting better or feel that you have an infection.   [facility] Holiday representative

## 2010-12-19 NOTE — Assessment & Plan Note (Addendum)
This will be managed initially as an exacerbation of COPD with bronchitis. I am not sure the mold exposure issue fits, and would be difficult to prove. Since abatement is being done, treatment will be nonspecific anyway. Seasonal pollens may be important, suggested by time of year, but without rhinitis. She questions efficacy of Advair so I will take opportunity to try replacing with Symbicort 160. I emphasized that as long as she continues to smoke, she can expect progressively more problems with time.  CXR 08/10/10 showed COPD with resolved pneumonia. I will repeat for update if cough persists. We will need to find opportunity to get PFT.

## 2010-12-19 NOTE — Progress Notes (Signed)
  Subjective:    Patient ID: [name], female    DOB: [birth year], 65 y.o.   MRN: [id number]  HPI 39 yoF smoker followed for COPD, tobacco dependence and cough. [employer] EMR reviewed for conversion to Epic.Has had extensive efforts at smoking counseling (husband died of tobacco induced lung cancer).  Comes today with increased cough since March 2, with concerns of relation to mold abatement being done in home. Refrigerator line leaked, later floor buckled and on removal, mold found beneath flooring. Thinks she may have coughed more since recent water leak onto kitchen floor. Denies fever, sore throat or obvious infection. Denies reflux. Has been more wheezey and short of breath Using her Advair and using Combivent 3 x/ day.   Review of Systems See HPI Constitutional:   No weight loss, night sweats,  Fevers, chills, fatigue, lassitude. HEENT:   No headaches,  Difficulty swallowing,  Tooth/dental problems,  Sore throat,                Some sneezing, itching, nasal congestion, post nasal drip,   CV:  No chest pain,  Orthopnea, PND, swelling in lower extremities, anasarca, dizziness, palpitations  GI  No heartburn, indigestion, abdominal pain, nausea, vomiting, diarrhea, change in bowel habits, loss of appetite  Resp:Increased shortness of breath with exertion and at rest.  No excess mucus, no productive cough,  Increased non-productive cough,  No coughing up of blood.  No change in color of mucus.  Some wheezing.   Skin: no rash or lesions.  GU: no dysuria, change in color of urine, no urgency or frequency.  No flank pain.  MS:  No joint pain or swelling.  No decreased range of motion.  No back pain.  Psych:  No change in mood or affect. No depression or anxiety.  No memory loss.     Objective:   Physical Exam General- Alert, Oriented, Affect-appropriate, Distress- none acute  Skin- rash-none, lesions- none, excoriation- none.    Small bruises on arms  Lymphadenopathy-  none  Head- atraumatic  Eyes- Gross vision intact, PERRLA, conjunctivae clear, secretions  Ears- Normal-earing, canals, Tm L ,   R ,  Nose- Clear, No- Septal dev, mucus, polyps, erosion, perforation   Throat- Mallampati III , mucosa clear , drainage- none, tonsils- atrophic  Neck- flexible , trachea midline, no stridor , thyroid nl, carotid no bruit  Chest - symmetrical excursion , unlabored     Heart/CV- RRR , no murmur , no gallop  , no rub, nl s1 s2                     - JVD- none , edema- none, stasis changes- none, varices- none     Lung- Slow bilateral expiratory wheeze, cough- none , dullness-none, rub- none, unlabored    Chest wall-   Abd- not obese  Br/ Gen/ Rectal- Not done, not indicated  Extrem- cyanosis- none, clubbing, none, atrophy- none, strength- nl  Neuro- grossly intact to observation        Assessment & Plan:

## 2010-12-21 ENCOUNTER — Encounter: Payer: Self-pay | Admitting: Internal Medicine

## 2010-12-21 NOTE — Assessment & Plan Note (Signed)
I spoke with her again about motivation to try quitting, available support. She is not really interested in trying.

## 2010-12-24 LAB — BASIC METABOLIC PANEL
BUN: 9 mg/dL (ref 6–23)
BUN: 6 mg/dL (ref 6–23)
BUN: 8 mg/dL (ref 6–23)
CO2: 23 mEq/L (ref 19–32)
CO2: 22 mEq/L (ref 19–32)
Calcium: 8.8 mg/dL (ref 8.4–10.5)
Calcium: 9.1 mg/dL (ref 8.4–10.5)
Creatinine, Ser: 0.66 mg/dL (ref 0.4–1.2)
Creatinine, Ser: 0.56 mg/dL (ref 0.4–1.2)
GFR calc Af Amer: >60 mL/min (ref >60)
GFR calc non Af Amer: >60 mL/min (ref >60)
GFR calc non Af Amer: >60 mL/min (ref >60)
Glucose, Bld: 151 mg/dL — ABNORMAL HIGH (ref 70–99)
Glucose, Bld: 106 mg/dL — ABNORMAL HIGH (ref 70–99)
Glucose, Bld: 133 mg/dL — ABNORMAL HIGH (ref 70–99)
Potassium: 4 mEq/L (ref 3.5–5.1)
Potassium: 4.3 mEq/L (ref 3.5–5.1)
Potassium: 4.6 mEq/L (ref 3.5–5.1)
Sodium: 134 mEq/L — ABNORMAL LOW (ref 135–145)

## 2010-12-24 LAB — CBC
Hemoglobin: 15 g/dL (ref 12.0–15.0)
MCHC: 35.2 g/dL (ref 30.0–36.0)
MCHC: 35.2 g/dL (ref 30.0–36.0)
MCV: 90.8 fL (ref 78.0–100.0)
MCV: 92.3 fL (ref 78.0–100.0)
Platelets: 333 10*3/uL (ref 150–400)
Platelets: 276 10*3/uL (ref 150–400)
RBC: 4.1 MIL/uL (ref 3.87–5.11)
RBC: 4.68 MIL/uL (ref 3.87–5.11)
RDW: 12.4 % (ref 11.5–15.5)
WBC: 14.5 10*3/uL — ABNORMAL HIGH (ref 4.0–10.5)
WBC: 13.8 10*3/uL — ABNORMAL HIGH (ref 4.0–10.5)

## 2010-12-24 LAB — COMPREHENSIVE METABOLIC PANEL
ALT: 16 U/L (ref 0–35)
AST: 21 U/L (ref 0–37)
Albumin: 3.6 g/dL (ref 3.5–5.2)
Calcium: 9.1 mg/dL (ref 8.4–10.5)
Creatinine, Ser: 0.64 mg/dL (ref 0.4–1.2)
GFR calc Af Amer: >60 mL/min (ref >60)
GFR calc non Af Amer: >60 mL/min (ref >60)
Sodium: 127 mEq/L — ABNORMAL LOW (ref 135–145)
Total Protein: 6.2 g/dL (ref 6.0–8.3)

## 2010-12-24 LAB — LEGIONELLA ANTIGEN, URINE: Legionella Antigen, Urine: NEGATIVE

## 2010-12-24 LAB — DIFFERENTIAL
Basophils Absolute: 0 10*3/uL (ref 0.0–0.1)
Basophils Relative: 1 % (ref 0–1)
Eosinophils Absolute: 0 10*3/uL (ref 0.0–0.7)
Eosinophils Absolute: 0.4 10*3/uL (ref 0.0–0.7)
Eosinophils Relative: 0 % (ref 0–5)
Lymphocytes Relative: 5 % — ABNORMAL LOW (ref 12–46)
Lymphocytes Relative: 11 % — ABNORMAL LOW (ref 12–46)
Lymphs Abs: 0.7 10*3/uL (ref 0.7–4.0)
Lymphs Abs: 0.8 10*3/uL (ref 0.7–4.0)
Lymphs Abs: 1.6 10*3/uL (ref 0.7–4.0)
Monocytes Absolute: 0.2 10*3/uL (ref 0.1–1.0)
Monocytes Absolute: 1.3 10*3/uL — ABNORMAL HIGH (ref 0.1–1.0)
Monocytes Relative: 4 % (ref 3–12)
Monocytes Relative: 9 % (ref 3–12)
Neutro Abs: 13.1 10*3/uL — ABNORMAL HIGH (ref 1.7–7.7)

## 2010-12-24 LAB — SODIUM, URINE, RANDOM: Sodium, Ur: <10 mEq/L

## 2010-12-24 LAB — GLUCOSE, CAPILLARY
Glucose-Capillary: 112 mg/dL — ABNORMAL HIGH (ref 70–99)
Glucose-Capillary: 118 mg/dL — ABNORMAL HIGH (ref 70–99)
Glucose-Capillary: 120 mg/dL — ABNORMAL HIGH (ref 70–99)
Glucose-Capillary: 121 mg/dL — ABNORMAL HIGH (ref 70–99)
Glucose-Capillary: 138 mg/dL — ABNORMAL HIGH (ref 70–99)
Glucose-Capillary: 134 mg/dL — ABNORMAL HIGH (ref 70–99)
Glucose-Capillary: 153 mg/dL — ABNORMAL HIGH (ref 70–99)
Glucose-Capillary: 153 mg/dL — ABNORMAL HIGH (ref 70–99)

## 2010-12-24 LAB — OSMOLALITY, URINE: Osmolality, Ur: 129 mOsm/kg — ABNORMAL LOW (ref 390–1090)

## 2010-12-24 LAB — CULTURE, BLOOD (ROUTINE X 2): Culture: NO GROWTH

## 2010-12-24 LAB — TSH: TSH: 0.4 u[IU]/mL (ref 0.350–4.500)

## 2010-12-24 LAB — POCT CARDIAC MARKERS
CKMB, poc: 1.6 ng/mL (ref 1.0–8.0)
Myoglobin, poc: 102 ng/mL (ref 12–200)

## 2011-02-03 NOTE — Assessment & Plan Note (Signed)
[facility]                             PULMONARY OFFICE NOTE   REEGAN, MCTIGHE                     MRN:          [id number]  DATE:04/18/2007                            DOB:          1946-04-20    PROBLEM:  1. Chronic obstructive pulmonary disease.  2. Tobacco abuse.  3. History of mucus plug with respiratory failure.   HISTORY:  She was hospitalized July 13th through 21st for ventilator-  dependent respiratory failure, secondary to exacerbation of COPD and  probably again had mucus plugs.  There were some left lower lobe density  on CT scan, and she needed bronchoscopy.  She is feeling  very much  better and wants to return to work tomorrow and feels able.  Spiriva was  stopped because of the mucus plugging.  She has not smoked in two weeks.  She wants to wait on cardiology followup for dobutamine stress test,  which was suggested in hospital, because of cost.  She feels  nervous/anxious and asks help for something with sleep.  We completed  her family leave act form.   MEDICATIONS:  Discharge medications:  1. Advair 250/50, one puff b.i.d.  2. Aspirin 81 mg.  3. Mucinex.  4. Albuterol inhaler, two puffs q.i.d. p.r.n.  5. Prednisone taper over 8 days.  6. Chantix 1 mg b.i.d.  7. Stool softener p.r.n.  8. Avelox to complete hospital course, then stop.  She is to stop      taking Spiriva as noted.   OBJECTIVE:  VITAL SIGNS:  Weight 118 pounds, BP 122/82, pulse 84, room  air saturation 99%.  CHEST:  Sounds clear today, with equal breath sounds, unlabored.  HEART:  Sounds regular without murmur.  No neck vein distention or  stridor.  NECK:  No adenopathy.  EXTREMITIES:  She is trim, no ankle edema.   IMPRESSION:  Chronic obstructive pulmonary disease, status post acute  exacerbation.  She will need a follow-up CT or chest x-ray to look again  at the left lower lobe, which may have been another mucus plug.   PLAN:  She is to remain  off of cigarettes, stay on Advair 250 and off of  Spiriva.  She will follow up with cardiology when able.  Try Temazepam  15 mg h.s. p.r.n for sleep with discussion.  Schedule return 6 weeks,  earlier p.r.n.     Clinton D. Maple Hudson, MD, Tonny Bollman, FACP  Electronically Signed    CDY/MedQ  DD: 04/21/2007  DT: 04/22/2007  Job #: [id number]   cc:   Otilio Connors. Gerri Spore, M.D.

## 2011-02-03 NOTE — Assessment & Plan Note (Signed)
[name]                             PULMONARY OFFICE NOTE   Kaitlyn, Ibarra                     MRN:          [id number]  DATE:03/23/2007                            DOB:          23-Sep-1945    PULMONARY FOLLOWUP:   PROBLEM LIST:  1. Chronic obstructive pulmonary disease.  2. Tobacco abuse.  3. History of mucus plug with respiratory failure.   HISTORY:  Her husband is our patient and now starting chemotherapy for  lung cancer.  She is concerned that she has increased scratchy cough,  and she does not want to risk giving him anything.  She is using Spiriva  daily, and it does seem to help.  She used up Foradil sample but never  got the prescription filled, and we discussed use of that as an  additional bronchodilator if needed.  She denies chest pain, fever,  blood, or adenopathy.   MEDICATIONS:  Multivitamin and calcium, Spiriva, Xalatan eye drops,  Fosamax, Foradil b.i.d. p.r.n. at this point, Mucinex.   No medication allergy.   OBJECTIVE:  VITAL SIGNS:  Weight 117 pounds, BP 124/68, pulse 93, room  air saturation 96%.  LUNGS:  Congested cough, without dullness or focal findings.  LYMPHATIC:  I do not find adenopathy.  HEART SOUNDS:  Regular, without murmur.  EXTREMITIES:  There is no edema.   IMPRESSION:  Chronic obstructive pulmonary disease exacerbation.  I  suspect that this an air quality problem currently.   PLAN:  1. Heavy emphasis on smoking cessation, particularly in light of her      husband's difficulty.  2. Z-PAK to hold.  3. Depo-Medrol 80 mg IM.  4. Xopenex 1.25 mg nebulizer treatment here.  5. Chest x-ray.  6. She has an appointment in August 2008 and will keep that, earlier      p.r.n.     Clinton D. Maple Hudson, MD, Tonny Bollman, FACP  Electronically Signed    CDY/MedQ  DD: 03/23/2007  DT: 03/24/2007  Job #: [id number]   cc:   Otilio Connors. Gerri Spore, M.D.

## 2011-02-03 NOTE — Op Note (Signed)
[name], [name]              ACCOUNT NO.:  [id number]   MEDICAL RECORD NO.:  [id number]          PATIENT TYPE:  INP   LOCATION:  1230                         FACILITY:  [facility]   PHYSICIAN:  Charlaine Dalton. Sherene Sires, MD, FCCPDATE OF BIRTH:  09-04-1946   DATE OF PROCEDURE:  04/06/2007  DATE OF DISCHARGE:                               OPERATIVE REPORT   PROCEDURE:  Therapeutic bronchoscopy with lavage.   HISTORY/INDICATIONS:  Please see progress notes.   The patient underwent an emergent bronchoscopy yesterday, which showed  copious retained secretions.  She has persistent mild atelectasis on  chest CT following bronchoscopy and is a candidate for extubation today.  Prior to extubation, I want to make sure all of the major plugs had been  removed, as she had not been able to cough this up prior to intubation.   The patient agreed to the procedure, therefore, after a full discussion  of risks, benefits and alternatives.   The procedure was performed in the ICU while the patient was on 100%  FiO2 through mechanical ventilation, and the bronchoscope was introduced  through an adaptor through her present endotracheal tube.   The patient had already been sedated by nursing and did not require  additional sedation throughout the procedure and maintained adequate  saturation throughout the procedure in sinus rhythm.   FINDINGS:  1. The endotracheal tube was in excellent position, about 4 cm above      the carina.  2. There was diffuse mild airway erythema.  3. There were copious mucoid secretions present through the left upper      lobe and lower lobe, but these were suctioned easily free.  There      were no true mucus plugs or casts seen.  4. All of the right-sided airways were unremarkable except for mild      erythema.   IMPRESSION:  1. Copious retained secretions have been removed, and the patient is      now a candidate for weaning and extubation if tolerated.  2. Hypoxemic  respiratory failure secondary to mucus plugs, resolved.      Charlaine Dalton. Sherene Sires, MD, [facility]  Electronically Signed     MBW/MEDQ  D:  04/08/2007  T:  04/08/2007  Job:  [id number]

## 2011-02-03 NOTE — Discharge Summary (Signed)
NAMELOIE, JAHR              ACCOUNT NO.:  [id number]   MEDICAL RECORD NO.:  [id number]          PATIENT TYPE:  INP   LOCATION:  1324                         FACILITY:  [facility]   PHYSICIAN:  Clinton D. Maple Hudson, MD, FCCP, FACPDATE OF BIRTH:  08-05-1946   DATE OF ADMISSION:  04/03/2007  DATE OF DISCHARGE:  04/11/2007                               DISCHARGE SUMMARY   DISCHARGE DIAGNOSIS:  1. Vent dependent hypoxic respiratory failure secondary to chronic      obstructive pulmonary disease exacerbation.  2. Acute tracheobronchitis with mucociliary dysfunction secondary to      smoking.  3. Atypical chest pain with negative cardiac workup.  4. Left lower lobe pulmonary density on CT scan, will need outpatient      CT follow up in six to eight weeks.   PROCEDURES:  1. Therapeutic bronchoscopy with lavage on July 16 by Dr. Nelda Bucks and on July 17 by Dr. Sandrea Hughs.  2. Right upper extremity PICC on July 16.  3. Oral intubation on July 16 with extubation on July 17.   BRIEF HISTORY OF PRESENT ILLNESS:  This is a 65 year old white female  patient of Dr. Maple Hudson who has a known history of COPD who continues to  smoke..  The patient presented to the emergency room with a 2-week  history of progressively worsening history of increased congestion and  shortness of breath.  One hour prior to arrival to the emergency room,  the patient complained of left-sided chest pain with radiation to the  left arm.  Previously the patient had been seen on an outpatient basis;  and treated with a Z-Pak and Omnicef  without any improvement in her  symptoms.   On arrival to the emergency room initial cardiac enzymes were negative.  The patient was hypoxic with ambulation; and the patient was admitted by  Dr. Sherene Sires.   LABS:  Chest x-ray showed left basilar atelectasis.  A subsequent CT  chest scan showed mucous plugging of the left lower lobe with a  parenchymal density in the left lower  lobe.  White blood cell count was  14,500, hemoglobin 12.8, hematocrit 37.2, platelets 246,000.  Sodium  139, potassium 3.7, chloride 110, carbon dioxide 25, BUN 16, creatinine  0.64, glucose 142.  Cardiac enzymes were negative x3.  Total cholesterol  222, LDL 96, HDL 87, triglycerides 197.  TSH was 0.248.  Sputum culture  revealed no organism seen with rare fungus isolated, felt to be possible  contaminant.  Sputum Gram stain was negative.  AFB negative to date --  final culture is pending.  Fungal smear showed rare yeast with  pseudohyphae.   HOSPITAL COURSE:  The patient is a 65 year old white female who was  admitted with present worsening dyspnea with activity and left-sided  chest pain.  Cardiac enzymes were negative x3. Initial chest x-ray  showed some left basilar atelectasis with no definite infiltrate.  The  patient was started on IV steroids and nebulized bronchodilators and  oral Avelox for suspected COPD exacerbation.  The patient was seen by  [facility]  cardiology, Dr. Excell Seltzer, and felt to have noncardiac chest pain.  But due to her risk factors with smoking, she was recommended to have an  outpatient follow-up.  A 2-D echo showed a normal left ventricular  function with an ejection fraction of 60-65%.  There was possible  hypokinesis in the inferior-posterior wall with some mild diastolic  dysfunction and dilated inferior vena cava..  The patient had had some  initial improvement; however, on 07/16 the patient's respiratory status  declined with hypoxemia; and the patient required oral intubation.  CT  scan showed some mucus plugging; and the patient underwent a  bronchoscopy with lavage on July 16 by Dr. Tyson Alias; and then repeated  on July 17 by Dr. Sherene Sires.   The patient was maintained on ventilatory support and was successfully  weaned on July 17.  The patient continued to improve and maintained  normal O2 saturations on room air with ambulation.  The patient reached   maximum hospital benefit on April 11, 2007, and was transitioned over to  oral steroids at 40 mg of prednisone.  During the patient's  hospitalization she was started on Chantix for smoking cessation. The  patient was discontinued off of her anticholinergic due to her repeated  mucous plugging and will need to stop her Spiriva on discharge.   DISCHARGE MEDICATIONS:  1. Advair 250/50 b.i.d.  2. Aspirin 81 mg daily.  3. Mucinex DM 2 b.i.d.  4. Prednisone 40 mg times two days, 30 mg times two days, 20 mg times      two days, 10 mg times two days and then stop.  5. Chantix 1 mg b.i.d.  6. Stool softener as needed  7. Avelox 400 mg times one day for a total of 7 days of therapy after      her last dose is completed today.  8. As above, the patient has been instructed to STOP her Spiriva and      avoid anticholinergics, in the future, due to her mucus plugging.   DISPOSITION:  The patient has been made an appointment with Dr. Jetty Duhamel at [facility] on April 18, 2007 for a 1 week follow up at  11:10 a.m.  She is instructed to call the office sooner if symptoms  worsen after discharge.  The patient also has been recommended to call  Dr. Excell Seltzer at [facility] for followup.  I have given her the  contact information for outpatient followup in the next 2 weeks as  recommended.  The patient is to increase her activity slowly; and do not  return back to work until seen back in the office by Dr. Maple Hudson.  Also  the patient will need to be set up an outpatient basis for a CT chest  for followup of her left lower lobe density in 6-8 weeks.  This will be  taken care of on her return to the office next week with Dr. Maple Hudson on  July 28.      Rubye Oaks, NP      Clinton D. Maple Hudson, MD, Tonny Bollman, FACP  Electronically Signed    TP/MEDQ  D:  04/11/2007  T:  04/11/2007  Job:  [id number]   cc:   Joni Fears D. Maple Hudson, MD, FCCP, FACP  [facility]-Pulmonary Dept  [address]  [address]  [place]  [place] [address]

## 2011-02-03 NOTE — H&P (Signed)
[name], [name]              ACCOUNT NO.:  [id number]   MEDICAL RECORD NO.:  [id number]          PATIENT TYPE:  EMS   LOCATION:  ED                           FACILITY:  [facility]   PHYSICIAN:  Charlaine Dalton. Sherene Sires, MD, FCCPDATE OF BIRTH:  22-Nov-1945   DATE OF ADMISSION:  04/03/2007  DATE OF DISCHARGE:                              HISTORY & PHYSICAL   CHIEF COMPLAINTS:  Dyspnea and cough.   HISTORY:  This is a 65 year old white female active smoker who says at  baseline she just takes Spiriva and occasional Foradil, with minimal  cough and dyspnea only with exertion.  However, about 2 weeks ago she  began having increasing cough that felt congested but actually never  coughed up purulent sputum.  In fact, she never coughed up excess mucus  at all.  The cough worsened, despite treatment as an outpatient with  Zithromax and then Omnicef.  She never ran fever, had pleuritic pain or  sputum production, but gradually became more short of breath and started  taking Foradil more regularly, with no benefit.  She came to the  emergency room overnight with an hour and a half episode of chest  discomfort radiating to her left arm, with a negative cardiac workup and  documented hypoxemia with walking, and I was asked to admit her by the  emergency room doctors.   The patient denies any overt sinus or reflux symptoms.  She says that  none of her inhalers are doing her any good. ( I do not believe she  actually has a rescue inhaler per se.)  She was very vague about her  medicines, and it took multiple questions to get the full story about  what exactly she is taking (see comments below).   PAST MEDICAL HISTORY:  Denies any major operations or hospitalizations.   ALLERGIES:  None.   MEDICATIONS:  Omnicef, prednisone, Spiriva, Mucinex, Tussionex, and  Foradil.  She was very vague about how she took any of these medicines,  and vague and indirect about disclosing all of these medicines.  She  denies having any rescue inhalers.   SOCIAL HISTORY:  She is an active smoker, as is her husband, who says,  but I smoke outside.  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   FAMILY HISTORY:  Positive for emphysema in her father and the fact that  both her brother and sister smoke but are healthy.  The mother had  liver cancer but she never smoked.Marland Kitchen   REVIEW OF SYSTEMS:  Taken in detail. Negative except as above.   PHYSICAL EXAMINATION:  GENERAL:  This is an anxious and depressed-  appearing white female, with a hopeless, helpless affect and attitude,  who failed to answer any questions asked her in a straightforward  manner.  VITAL SIGNS:  She is afebrile, with normal vital signs.  HEENT:  Unremarkable.  Oropharynx is clear.  NECK:  Supple without cervical adenopathy or tenderness.  Trachea is  midline, with no thyromegaly.  LUNG FIELDS:  Reveal pan-expiratory rhonchi, with prominent pseudo-  wheeze as well,  and severe coughing on expiratory maneuvers.  HEART:  There was a regular rate rhythm, without murmur, gallop, or rub.  S1 and S2 were diminished.  ABDOMEN:  Soft, benign with no palpable organomegaly, mass, or  tenderness.  EXTREMITIES:  Without calf tenderness, cyanosis, clubbing, or edema.   Chest x-ray showed no infiltrates or effusions.  WBC was 24,000 (on  prednisone).  Remainder of the CBC was normal.  Troponin was negative,  and BNP was only 36.  Chemistry profile was normal, except for  nonfasting blood sugar of 177.   IMPRESSION:  1. Atypical chronic obstructive pulmonary disease exacerbation, with      hypoxemic respiratory failure.  I am not sure if she actually does      have excess mucus, as she does not appear end-stage and yet cannot      cough up any of her secretions.  I think we are dealing with excess      inflammation in the upper and lower airway rather than excess      secretions or purulent secretions at this point.  Therefore, I  am      going to hold antibiotics and treat her vigorously with around-the-      clock bronchodilators and IV Solu-Medrol.  I am also going to      optimize treatment for cough with a flutter valve and Protonix as      well as Mucinex DM.  2. Fleeting chest pain of unclear etiology.  Will do another cycle of      enzymes and a 12-lead EKG.  She had never had exertional chest pain      before but is at risk based on her smoking history, if not her      family history, for ischemic heart disease.  3. Anxiety and depression may be making it difficult to both help her      stop smoking and also manage her as an outpatient, based on have      difficulty was to extract a history of medication use from this      patient.  I suspect she is noncompliant both with medications and      obviously with the cigarette issue.      Charlaine Dalton. Sherene Sires, MD, [facility]  Electronically Signed     MBW/MEDQ  D:  04/03/2007  T:  04/04/2007  Job:  [id number]

## 2011-02-03 NOTE — Assessment & Plan Note (Signed)
[facility]                             PULMONARY OFFICE NOTE   [name], [name]                     MRN:          [id number]  DATE:06/17/2007                            DOB:          [birth year]    PROBLEM LIST:  1. Chronic obstructive pulmonary disease.  2. Tobacco abuse.  3. History of mucous plug with respiratory failure.  4. Abnormal CT.   HISTORY:  She is smoking less because she only smokes at home.  Husband  is a continual strain for her because of his bad rheumatoid arthritis  and ongoing treatment for lung cancer.  She had tried Chantix but it  caused insomnia and nightmares and was discontinued.  No phlegm, no  chest pain or palpitation.   MEDICATIONS:  1. Advair 250/50.  2. Aspirin 81 mg.  3. Occasional temazepam.   No medication allergy.  Question intolerance to [facility].   She continues Xalatan eye drops.   OBJECTIVE:  Weight 125 pounds, BP 120/78, pulse 72, room air saturation  98%.  CHEST:  Sounds quiet and clear and she does not look labored.  There is  no cough.  Heart sounds are regular without murmur.  I find no adenopathy, no edema.   Chest CT July 17 raised concern of a mucous plug in the posterior  basilar segment of the left lower lobe, and showed some parenchymal  density in the left lower lobe which was nonspecific recommending  followup.  Chest x-ray is obtained today and shows hyperinflation  without acute or superimposed abnormality.  There is an old left 7th rib  fracture, heart size normal.   IMPRESSION:  1. Chronic obstructive pulmonary disease.  2. Tobacco use.   PLAN:  Flu vaccine was discussed and given.  Smoking cessation was  reinforced.  Schedule return 4 months, earlier p.r.n.    Clinton D. Maple Hudson, MD, Tonny Bollman, FACP  Electronically Signed   CDY/MedQ  DD: 06/17/2007  DT: 06/18/2007  Job #: [id number]

## 2011-02-03 NOTE — Assessment & Plan Note (Signed)
[facility]                             PULMONARY OFFICE NOTE   LAURAL, EILAND                     MRN:          [id number]  DATE:04/11/2007                            DOB:          1946/02/19    PULMONARY OFFICE FOLLOWUP   PROBLEMS:  1. Chronic obstructive pulmonary disease.  2. Tobacco abuse.  3. History of mucus plug with respiratory failure.   HISTORY:  She had been seen July 2, but says she really has not been  able to sleep because of cough with orthopnea over the past week.  Phenergan with codeine not strong enough for cough suppression.  Hard  coughing.  Dry weather bothers her, but not enough for her to quit  smoking, unfortunately, despite our counseling.  She did not try  Mucinex.   MEDICATIONS:  Multivitamins.  Calcium.  Spiriva.  Xalatan eye drops.  Fosamax.  Foradil used occasionally.  Mucinex.   OBJECTIVE:  Weight 117 pounds, BP 112/ 68, pulse 94, room air saturation  98%.  Diminished breath sounds.  Only a little coughing on end expiration  here.  No focal findings or dullness.  Heart sounds are regular.  Best heard at the xyphoid without murmur or  gallop.  I do not find adenopathy, cyanosis, or edema.   IMPRESSION:  Chronic obstructive pulmonary disease with exacerbation.   PLAN:  1. Smoking cessation.  2. Prednisone 8-day taper with steroid talk.  Tussionex 300 ml to use      5 ml q.12h p.r.n. occasional use for cough.  3. Omnicef 300 mg b.i.d. x7 days.  4. Keep scheduled appointment, earlier p.r.n.     Clinton D. Maple Hudson, MD, Tonny Bollman, FACP  Electronically Signed    CDY/MedQ  DD: 04/11/2007  DT: 04/12/2007  Job #: [id number]   cc:   Otilio Connors. Gerri Spore, M.D.

## 2011-02-03 NOTE — Op Note (Signed)
[name], [name]              ACCOUNT NO.:  [id number]   MEDICAL RECORD NO.:  [id number]          PATIENT TYPE:  INP   LOCATION:  1230                         FACILITY:  [facility]   PHYSICIAN:  Nelda Bucks, MD DATE OF BIRTH:  10/31/1945   DATE OF PROCEDURE:  DATE OF DISCHARGE:                               OPERATIVE REPORT   PROCEDURE:  Bronchoscopy with bronchoalveolar lavage.   CONSENT:  Consent was obtained from the husband.   ESTIMATED BLOOD LOSS:  Blood loss throughout the procedure is less than  0.1 mL.   MEDICATIONS USED:  Total of 4 of Versed in divided doses, 200 mcg of  fentanyl in divided doses and 90 mg of propofol in divided doses.  Also  used 3 mL of 1% lidocaine down the endotracheal tube.   INDICATIONS FOR PROCEDURE:  Left main collapse, total left lung  collapse, refractory hypoxemia.   This was an emergency procedure secondary to hypoxemia.  The patient has  a prior history of having a white mucus plug in the left main  approximately 4 years ago taken care of by Dr. Francella Solian and Dr. Dewayne Shorter.  She now presented with similar complaints.  The PEEP was  reduced to 5 to limit mean airway pressure throughout the procedure.  The patient was saturating 84% prior to the procedure on maximal 100%  FiO2.   ENDOSCOPIST:  Nelda Bucks, M.D.   DESCRIPTION OF PROCEDURE:  The bronchoscope was introduced and the  following findings were noted:  1. Complete left main collapse from a thick white hard mucus plug from      the left main all throughout the upper division including the      lingula and left lower lobe.  2. The right lung was not instrumented on purpose to avoid      contamination if this was an infectious process.  3. Post suctioning, extensive suctioning with approximately 120 of      normal saline throughout her bronchoscopy, there were no      endobronchial lesions noted that I could see on the mucosa,      although visualization was not  perfect throughout this procedure      given the emergency and limited bronchoscopic tool.  4. Status post bronchoalveolar lavage in the left lower lobe, chosen      secondary to the extensive amount of mucous plugging noted, knowing      that yield may be low in the left lower lobe.  Then 10 mL of normal      saline was instilled in the left lower lobe and approximately after      15 seconds suctioning intermittently took place into a trap.  The      bronchoalveolar lavage specimen appeared to be clear with hard      yellow sediment mucous plugging throughout.  5. Mild bronchoscopy trauma noted throughout the upper division with      some edema noted post procedure but no active bleeding.   Post procedure the entire left main upper division and left lower lobe,  lingula  and left upper lobe were all cleared from mucous plugging.  Most  of the saline instilled, except from the BAL was all suctioned back.  I  did instill approximately 8 mL of 20% 1:1 dilution in normal saline  Mucomyst into this area and then suctioned back after approximately 1  minute.  The patient will remain on a PEEP of 8 and portable chest x-ray  is pending at this time for my review.  The patient's saturation  improved drastically after the left main mucus plug was suctioned clear  and pretty much saturated 100% throughout the procedure.  The patient  remained normotension and tolerated the propofol and instrumenting  without any significant hemodynamic compromise, as well as her heart  rate improving.  I did speak to her husband and entire family post  procedure and they are aware of the findings that I have listed here.  I  will be sending a large number of particulate, hard mucus plug material  in formalin to pathology and cytopathology, as well as sending the  bronchoalveolar lavage for Gram stain culture and sensitivity, fungal  cultures, viral cultures, AFB, cell count and differential.      Nelda Bucks, MD  Electronically Signed     DJF/MEDQ  D:  04/06/2007  T:  04/07/2007  Job:  [id number]   cc:   Ines Bloomer, M.D.  [address]  [place]  [place] [address]   Charlaine Dalton. Sherene Sires, MD, FCCP  [address]  [place] [address]

## 2011-02-03 NOTE — Consult Note (Signed)
[name], [name]              ACCOUNT NO.:  [id number]   MEDICAL RECORD NO.:  [id number]          PATIENT TYPE:  INP   LOCATION:  1230                         FACILITY:  [facility]   PHYSICIAN:  Veverly Fells. Excell Seltzer, MD  DATE OF BIRTH:  Apr 07, 1946   DATE OF CONSULTATION:  04/06/2007  DATE OF DISCHARGE:                                 CONSULTATION   REQUESTING PHYSICIAN:  Charlaine Dalton. Sherene Sires, MD, FCCP.   REASON FOR CONSULTATION:  Chest pain.   HISTORY OF PRESENT ILLNESS:  [name] is a 65 year old woman who we  were asked to see by Dr. Sherene Sires for recurrent chest pain.  She was  admitted on April 03, 2007, with shortness of breath, a feeling of  congestion and an episode of chest discomfort.  She has been treated  for an atypical COPD exacerbation with hypoxemic respiratory failure.  She is receiving bronchodilators and IV Solu-Medrol as well as Protonix.  Early this morning, she developed a second episode of substernal chest  pain that radiated to the left arm.  The entire episode lasted 30  minutes.  This was similar to an episode that occurred on April 02, 2007.  Both episodes occurred at rest.  She denies any exertional symptoms.  She has no history of coronary disease or cardiac problems.  She is now  chest pain-free and has no other acute complaints.  Her breathing is a  little bit better but she continues to be dyspneic.  She has had no  orthopnea, PND or edema.  She has had no palpitations or syncope.   PAST MEDICAL HISTORY:  Includes the following.  1. History of the ventilatory dependent respiratory failure in 2004.  2. COPD.  3. Anxiety and depression.  4. Ongoing tobacco use abuse.  5. Glaucoma.  6. Remote hysterectomy.   MEDICATIONS:  Include Lovenox, Solu-Medrol, Mucinex, Ventolin, and  Atrovent nebulizer treatments, Protonix, Chantix and Avelox.   ALLERGIES:  NO KNOWN DRUG ALLERGIES.   SOCIAL HISTORY:  The patient lives in [place], [place], with  her husband.   She works as a Orthoptist.  She is a one-pack  per day smoker and has been for many years.  She drank alcohol on  occasion.  Her regular activities include yard work and walking without  much trouble.   FAMILY HISTORY:  Pertinent for COPD in siblings and her father.  Her  mother died of cancer.  There is no coronary artery disease in the  family.   REVIEW OF SYSTEMS:  Pertinent positives included headache, depression  and a recent upper respiratory tract infection.  All other systems were  reviewed and were negative.  A 12-point review of systems was performed.   PHYSICAL EXAMINATION:  GENERAL APPEARANCE:  The patient is alert and  oriented.  She is an anxious appearing white female in no acute  distress.  VITAL SIGNS:  Heart rate 86, respiratory rates 20, blood pressure  127/60.  Oxygen saturations 91% on 2 liters of oxygen per nasal cannula.  HEENT:  Normal.  NECK:  Normal carotid upstroke without bruits.  Jugular  venous pressure  was normal.  No thyromegaly or thyroid nodules.  LUNGS:  Clear to auscultation bilaterally.  CARDIOVASCULAR:  The apex is discreet and nondisplaced.  HEART:  Regular rate and rhythm without murmurs or gallops.  There is no  right ventricular heave or lift.  BACK:  There is no CVA tenderness.  ABDOMEN:  Soft, nontender, no organomegaly.  No abdominal bruits.  No  masses.  EXTREMITIES:  No clubbing, cyanosis or edema.  Peripheral pulses are 2+  and equal throughout.  There are no femoral artery bruits.  MUSCULOSKELETAL:  There is no joint deformities.  SKIN:  There is no rash or lesions.  NEUROLOGIC:  Cranial nerves II-XII are intact.  Strength 5/5 and equal  in the arms and legs.   Chest x-ray shows stable COPD pattern with streaking atelectasis.   EKG from this morning shows normal sinus rhythm with an unusual P axis  that I suspect is a ectopic atrial rhythm.  There are no ST-segment or T-  wave changes.   Cardiac biomarkers are  negative with troponin of 0.03, followed by 0.02.  D-dimer was less than 0.22.  BNP was normal at 36.  CK-MB was 2.8.  Creatinine 0.7.   ASSESSMENT:  This is a 65 year old female with a cardiovascular risk  factor of cigarette smoking, who presents with chest pain in the setting  of underlying chronic obstructive pulmonary disease with an acute  exacerbation.  She has now had two episodes of chest pain both at rest.  She has no objective findings of ischemia with an unremarkable EKG and  normal cardiac markers.  I do not think she has an acute coronary  syndrome.  I think it is worthwhile to start her on 81 mg aspirin but  would not treat her otherwise.  She has received nitroglycerin with no  symptomatic benefit.  After she recovers from her chronic obstructive  pulmonary disease exacerbation and hospitalization, would favor a  dobutamine Myoview stress study to rule out any significant underlying  ischemic heart disease.  We will make arrangements offer this to be  performed as an outpatient.  Will follow-up with another set of enzymes  in the morning as she had a recurrent episode of pain earlier today.  If  these are negative, then I would not pursue any further inpatient  cardiac workup.   Thank you for the opportunity to see Ms. Schiller.  Please feel free to  call at any time with questions regarding her care.      Veverly Fells. Excell Seltzer, MD  Electronically Signed     MDC/MEDQ  D:  04/06/2007  T:  04/07/2007  Job:  [id number]

## 2011-02-06 NOTE — Discharge Summary (Signed)
[name], [name]                        ACCOUNT NO.:  [id number]   MEDICAL RECORD NO.:  [id number]                   PATIENT TYPE:  INP   LOCATION:  5704                                 FACILITY:  [facility]   PHYSICIAN:  Charlaine Dalton. Sherene Sires, M.D. [facility]           DATE OF BIRTH:  1946-08-25   DATE OF ADMISSION:  04/16/2003  DATE OF DISCHARGE:  04/27/2003                                 DISCHARGE SUMMARY   DISCHARGE DIAGNOSES:  1. Acute hypoxemic respiratory failure requiring mechanical ventilation     secondary to mucous obstruction of the left mainstem bronchus.     a. Status post bronchoscopy on April 17, 2003.     b. Status post diagnostic/therapeutic repeat rigid and flexible        bronchoscopy on April 20, 2003, per Dr. Karle Plumber; diagnostic of a        mucous cast.  2. Chronic obstructive pulmonary disease, actively smoking prior to     admission.  3. Chronic anxiety.  4. Glaucoma.  5. Rash this admission, probably due to Protonix, resolved.   HISTORY OF PRESENT ILLNESS:  Please see dictated H&P. This is a 65 year old  white female, active smoker, with refractory symptoms of cough and dyspnea,  dating back at least several months.  She was last seen in our office by Dr.  Shelle Iron in May who recommended a two week followup visit.  She said she could  never schedule it when she could be off the same day that I was in the  office and had been seen in several walk-in clinics and given several rounds  of antibiotics (she could not remember the names) prior to presenting to the  emergency room on July 26, with worsening dyspnea and cough, but no sputum  production, fever, chills, or pleuritic pain.   Her chest x-ray suggested air space disease in the left lower lobe, but on  CT scan, she appeared to have obstruction of the left main stem bronchus  with postobstructive changes in the left lung. Because she had so many  different antibiotics, I elected not to treat her initially  with antibiotic  therapy, but rather pursue the reason that her left lower lobe appeared  obstructed.  She, therefore, agreed to bronchoscopy on the second hospital  day, July 27, which indicated what appeared to me to be a necrotic mass  almost completely obstructing the left lower lobe. However, biopsies simply  returned inflammatory debris.   HOSPITAL COURSE:  She deteriorated postbronchoscopy and required mechanical  ventilation on July 29, with new air space disease on the right consistent  with a hospital-acquired pneumonia.  She was treated initially with Avalox  on July 28, and then switched over on July 29, to Maxipime and vancomycin to  cover all of the possible hospital-acquired organisms.  However, all of the  cultures throughout hospitalization remained negative except for the late  growth of Aspergillus  from the therapeutic bronchoscopy by Dr. Edwyna Shell on  July 30.  These were felt to be saprophytic organisms, but could ABPA (see  below).   The patient had somewhat of a stormy course and required very heavy sedation  while on the ventilator, but eventually was weaned and extubated on August  3, and then moved to the floor where she was seen by physical therapy and  occupational therapy.  It was felt that she needed to be in a short care  rehabilitation unit or home with PT and she elected to go home. Prior to  discharge, she was ambulated on room air with saturations consistently above  88%. She remained weak and needing assistance to go to the bathroom, but  able to void on her own and did not require around-the-clock nebulizer  therapy.   In fact, this patient really never had significant bronchospasms with  wheezing documented; therefore, the possibility of ABDA with the culture  result may not be clinically relevant, but will be addressed as an  outpatient with followup IgE and eosinophil levels.  For now, she was  advised absolutely not to smoke and she will be seen in  the office within a  week for followup chest x-ray.   Chest x-ray done at the time of discharge showed marked improvement in  aeration in the bases.  There was significant, however, underlying residual  parenchymal change plus a possible pleural density seen on the lateral view.  This will be followed up with a chest x-ray in a week and perhaps a CT scan  if indicated.   DISCHARGE MEDICATIONS:  1. Nicotine patch 14 mg daily.  2. Pepcid 20 mg b.i.d.  3. Advair  250, one b.i.d.  4. Prednisone 20 mg x3 days and then 10 mg daily thereafter until seen in     the office.  5. Mucinex 600 mg two q.12 h. for wheezing.  6. She can use albuterol and Atrovent two puffs of each q.4 h.  7. She will continue to use her eyedrops as before.   CONDITION ON DISCHARGE:  Improved.   DISCHARGE INSTRUCTIONS:  She is to maintain a normal diet and see Dr. Sherene Sires  in a week for followup.                                                Charlaine Dalton. Sherene Sires, M.D. [facility]    MBW/MEDQ  D:  04/28/2003  T:  04/28/2003  Job:  [id number]

## 2011-02-06 NOTE — Op Note (Signed)
   NAMEADEN, YOUNGMAN                        ACCOUNT NO.:  [id number]   MEDICAL RECORD NO.:  [id number]                   PATIENT TYPE:  INP   LOCATION:  2316                                 FACILITY:  [facility]   PHYSICIAN:  Ines Bloomer, M.D.              DATE OF BIRTH:  [birth year]   DATE OF PROCEDURE:  04/20/2003  DATE OF DISCHARGE:                                 OPERATIVE REPORT   PREOPERATIVE DIAGNOSIS:  Respiratory distress, left bronchus, with mainstem  bronchus mass.   POSTOPERATIVE DIAGNOSIS:  Fibrotic plug, left mainstem bronchus, left lower  lobe and left superior segment of left lower lobe.   OPERATION PERFORMED:  Laser bronchoscopy.   After general anesthesia, the fiberoptic bronchoscope was passed through the  endotracheal tube.  This patient was in respiratory distress and was already  on a respirator.  The right upper lobe, right middle lobe and right lower  lobe orifices were normal.  The carina was in the midline.  The left  mainstem bronchus along with the distal left mainstem bronchus were almost  completely occluded with a whitish-looking tumor.  Biopsies were taken from  this area, and they seemed to be very necrotic.  Frozen section also  revealed necrotic debris.  Using the biopsy forceps, the tumor was gradually  removed and broken up with the biopsy forceps and with suction until the  left upper lobe was opened and then the debris lazed with the laser using 25  watts per second and the fiber stuck to the debris and further broke the  cast up such that we were able to move a long 2 cm cast of the left lower  lobe.  There was still a lot debris in the superior segment, and this was  gradually removed with the biopsy forceps.  I also pulled out with the #5  Fogarty balloon and multiple irrigations and suctioned until at least 95% of  all the necrotic debris was removed.  Her sats improved from 88% to 100% and  pictures were taken to document this and  then she returned to the recovery  room in stable condition.                                                Ines Bloomer, M.D.    DPB/MEDQ  D:  04/20/2003  T:  04/21/2003  Job:  [id number]   cc:   Casimiro Needle B. Sherene Sires, M.D. [facility]

## 2011-02-06 NOTE — Op Note (Signed)
NAMEAILED, DEFIBAUGH                        ACCOUNT NO.:  [id number]   MEDICAL RECORD NO.:  [id number]                   PATIENT TYPE:  INP   LOCATION:  5709                                 FACILITY:  [facility]   PHYSICIAN:  Charlaine Dalton. Sherene Sires, M.D. [facility]           DATE OF BIRTH:  Aug 06, 1946   DATE OF PROCEDURE:  04/17/2003  DATE OF DISCHARGE:                                 OPERATIVE REPORT   PROCEDURE PERFORMED:  Fiberoptic bronchoscopy with endobronchial biopsy of  the left mainstem bronchus.   HISTORY:  Please see dictated history and physical.  The patient agreed to  the procedure after full discussion of the risks, benefits and alternatives.   DESCRIPTION OF PROCEDURE:  The procedure was done in the bronchoscopy suite  with continuous monitoring by surface electrocardiogram and oximetry.  The  patient maintained oxygen saturations throughout the procedure and sinus  rhythm wearing nasal oxygen.  She received a total of 5 mg of IV Versed and  50 mg of IV Demerol for adequate sedation and cough suppression.   The right naris and oropharynx were liberally anesthetized with 10%  lidocaine spray and the right naris was additionally prepared with 2%  lidocaine jelly.  Using a standard flexible fiberoptic bronchoscope, the  right naris was easily cannulated with good visualization of the oropharynx  and larynx.  The cords moved normally and there were no apparent upper  airway lesions.   Using additional 1% lidocaine was needed, the entire tracheobronchial tree  was explored bilaterally with the following findings.   1. Trachea, carina and the right-sided airways were unremarkable.  2. The left-sided airways were markedly abnormal with a fungating mass     obstructing the distal third of the left mainstem bronchus not allowing     the bronchoscope to be passed.  There were mucoid secretions as well.  I     could not lavage any of them completely free to be able to see the airway  distally.   Using endobronchial forceps technique, four adequate endobronchial biopsies  were obtained with minimal bleeding.  Additionally, the  left mainstem  bronchus was lavaged.   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   RECOMMENDATIONS:  Await tissue confirmation.   SPECIMENS:  Samples were sent as follows:  1. Endobronchial biopsy of the left mainstem bronchus for histology and     special stains if indicated.  2. Bronchial lavage for cytology and Gram stain and culture.                                               Charlaine Dalton. Sherene Sires, M.D. [facility]    MBW/MEDQ  D:  04/17/2003  T:  04/18/2003  Job:  [id number]

## 2011-02-06 NOTE — Assessment & Plan Note (Signed)
[name]                             PULMONARY OFFICE NOTE   Kaitlyn, Ibarra                     MRN:          [id number]  DATE:11/11/2006                            DOB:          1945-10-18    PROBLEMS:  1. Chronic obstructive pulmonary disease.  2. Tobacco abuse.  3. History of mucus plug with respiratory failure.   HISTORY:  She returns for scheduled followup still smoking.  She has  gotten through this winter so far with no significant infection, but she  remains worried in the current flu outbreak.  She did not get a flu  vaccine.  She feels tired by the end of the day, and blames it on age.  Cough is occasionally a little productive but has not changed in  character and sputum is only white.  Mucinex sometimes helps and Spiriva  seems to help.  There have been no acute events.   MEDICATIONS:  1. Multivitamins and calcium.  2. Spiriva.  3. Xalatan eye drops.  4. Mucinex.   ALLERGIES:  No medication allergy.   OBJECTIVE:  Weight 118 pounds, BP 132/78, pulse regular 79, room air  saturation 98%.  Breath sounds are somewhat diminished, expiratory phase is slow but  there are no rales or rhonchi and no use of accessory muscles.  I do not find adenopathy, neck vein distention, stridor or edema.  Heart sounds are regular without murmur.   IMPRESSION:  Clinically stable chronic obstructive pulmonary disease.   PLAN:  1. Heavy emphasis on our discussion on smoking cessation, options,      assistance and encouragement.  2. Try again with Foradil 1 b.i.d. while continuing Spiriva.  3. Doxycycline prescription to hold, 2 for the first day then 1 daily      for a week.  4. Schedule return in 6 months, earlier p.r.n.     Clinton D. Maple Hudson, MD, Tonny Bollman, FACP  Electronically Signed   CDY/MedQ  DD: 11/11/2006  DT: 11/12/2006  Job #: [id number]   cc:   Otilio Connors. Gerri Spore, M.D.

## 2011-02-06 NOTE — H&P (Signed)
NAMERELENA, [name]                        ACCOUNT NO.:  [id number]   MEDICAL RECORD NO.:  [id number]                   PATIENT TYPE:  INP   LOCATION:  5709                                 FACILITY:  [facility]   PHYSICIAN:  Charlaine Dalton. Sherene Sires, M.D. [facility]           DATE OF BIRTH:  05/03/1946   DATE OF ADMISSION:  04/16/2003  DATE OF DISCHARGE:                                HISTORY & PHYSICAL   CHIEF COMPLAINT:  Cough and dyspnea.   HISTORY OF PRESENT ILLNESS:  This is a 65 year old white female, a long-term  smoker, whom I last apparently saw in the office in 2001, but has been seen  in our office twice in the last three months complaining of cough and  dyspnea for five days.  She was last seen by Dr. Shelle Iron on Feb 07, 2003,  and was supposed to see me in two weeks for a followup lung function test.  She was actively smoking at that time.  She states that she had trouble  getting a follow-up appointment and has been seen several times since that  time in several walk-in clinics, always with a complaint of cough, which she  says in retrospect really has been present for three months on a steady  basis, but does wax and wane depending on whether or not she receives a  course of antibiotics or prednisone.  Today she came to the emergency room  with coughing associated with worsening dyspnea and also chest discomfort  with coughing paroxysms located in the left parasternal and substernal  distribution.  A chest x-ray suggested left lower lobe pneumonia, but a CT  scan suggested possible obstruction of the left lower lobe bronchus.   The patient denies any recent purulent sputum or hemoptysis.  She complains  of dyspnea now just walking room to room, but no difficulty swallowing,  fever, chills, sweats, or unintended weight loss.   PAST MEDICAL HISTORY:  1. Intermittent asthmatic bronchitis attributed previously to smoking.  2. Possible history of depression.  3. History of hysterectomy  in 1997.   ALLERGIES:  None known.   MEDICATIONS:  She previously had been on Premarin one daily.   SOCIAL HISTORY:  Active smoking as noted above.  Denies any alcohol use.   FAMILY HISTORY:  Positive for emphysema in her father.  Positive for  arthritis in her mother.  No premature heart disease in her family nor  cancers in direct relatives.   REVIEW OF SYSTEMS:  Taken in detail and essentially negative, except as  noted above.   PHYSICAL EXAMINATION:  GENERAL APPEARANCE:  This is an anxious, chronically  ill-appearing, white female in no acute distress.  VITAL SIGNS:  Stable.  HEENT:  Unremarkable, clear.  NECK:  Supple without cervical adenopathy or thyromegaly.  LUNGS:  The lung fields reveal decreased breath sounds at the left base with  no localized or generalized wheezing.  Overall air movement  was adequate,  except in the left base.  HEART:  There was regular rate and rhythm without murmur, rub, or gallop  present.  ABDOMEN:  Soft and benign with no palpable organomegaly, masses, or  tenderness.  EXTREMITIES:  Warm without calf tenderness, cyanosis, or clubbing.   LABORATORY DATA:  The oxygen saturation was in the 80s on room air and was  improved to the mid 90s on 2 L.  The white count was 24,000 and hematocrit  was 42.5%.  The chemistry profile was unremarkable.  The chest x-ray and CT  scan were as outlined above.   IMPRESSION:  Chronic cough now dating back several months that has waxed and  waned in terms of intensity and has been partially responsive to  antibiotics.  Now associated with possible endobronchial disease involving  the left lower lobe.  I cannot appreciate any air at all move into the left  lower lobe.  On the other hand, she does not appear to have any clinical  evidence of classic post obstructive pneumonia.  She is short of breath  now with just minimal activity which is new for her and hypoxemic at rest so  I believe that she will need to be  admitted and treated as an inpatient.   PLAN:  My specific plan is to admit her and place her on supplemental  oxygen, IV steroids, and bronchodilators to see if we can improve the  aeration in the left base and then perform bronchoscopy and obtain  microscopic studies at the time of bronchoscopy, as well as an endobronchial  biopsy if a lesion is present.                                                Charlaine Dalton. Sherene Sires, M.D. [facility]    MBW/MEDQ  D:  04/17/2003  T:  04/17/2003  Job:  [id number]

## 2011-02-06 NOTE — Assessment & Plan Note (Signed)
[facility]                               PULMONARY OFFICE NOTE   LIANE, TRIBBEY                     MRN:          [id number]  DATE:05/04/2006                            DOB:          Dec 23, 1945    PROBLEMS:  1. Chronic obstructive pulmonary disease.  2. Tobacco abuse.  3. History of mucous plug with respiratory failure.   HISTORY:  The patient continues to smoke an average of a pack a day.  She  asks appropriately whether the Spiriva she likes might aggravate her  glaucoma.  She says neither Foradil nor a metered inhaler ever worked nearly  as well for her as Sherrye Payor has.  We discussed this and I asked her to go her  eye doctor for direct measurement rather than a gas.  Spiriva might  aggravate her glaucoma control and it would be up to her and her eye doctor  to decide whether that can be compensated for if needed.  In the past month  she has had increased cough with a little thick, white mucous which has been  non specific and possibly related tot he air quality.  There has been no  blood, pain or purulent discharge.   MEDICATIONS:  Limited to Spiriva, Xalatan and occasional Mucinex.   ALLERGIES:  NO MEDICATION ALLERGY.   OBJECTIVE:  Weight 120 pounds, blood pressure 130/72, pulse regular and 76,  room air saturation 98%.  Eyes, nose and throat were clear.  Heart sounds  regular without murmur or gallop.  I find no adenopathy.  Lungs are quiet  and unlabored without rales, rhonchi wheeze or dullness. There is no  clubbing, cyanosis or edema.   IMPRESSION:  COPD with mild bronchitis exacerbation, most likely due to air  quality.   PLAN:  1. Strong emphasis on smoking cessation in our discussion again today.      She was given a booklet and started a prescription for Chantix with      encouragement to try it.  2. Chest x-ray.  3. Refill Spiriva.  4. She is to have her eye doctor check her pressures while taking Spiriva.  5.  Schedule return in six months, earlier p.r.n.                                  Clinton D. Maple Hudson, MD, FCCP, FACP   CDY/MedQ  DD:  05/04/2006  DT:  05/05/2006  Job #:  [id number]   cc:   Otilio Connors. Gerri Spore, MD

## 2011-02-18 ENCOUNTER — Telehealth: Payer: Self-pay | Admitting: Internal Medicine

## 2011-02-18 NOTE — Telephone Encounter (Signed)
Yes- HC placard no problem

## 2011-02-18 NOTE — Telephone Encounter (Signed)
Dr young please advise if you are willing to do handicap placard for pt

## 2011-02-19 NOTE — Telephone Encounter (Signed)
Form completed and pt request it be mailed. Form placed in mail. Carron Curie, CMA

## 2011-02-19 NOTE — Telephone Encounter (Signed)
HC form placed on CY look-at to sign. Carron Curie, CMA

## 2011-03-18 ENCOUNTER — Other Ambulatory Visit: Payer: Self-pay | Admitting: Internal Medicine

## 2011-03-19 NOTE — Telephone Encounter (Signed)
Please advise if okay to refill RX? Thanks.  

## 2011-03-20 NOTE — Telephone Encounter (Signed)
OK to refill xanax till next ov

## 2011-03-23 ENCOUNTER — Telehealth: Payer: Self-pay | Admitting: Internal Medicine

## 2011-03-23 NOTE — Telephone Encounter (Signed)
Refill sen ton 03-18-11. I verified with pharmacy rx ready for pick-up. Pt aware. Carron Curie, CMA

## 2011-05-19 ENCOUNTER — Other Ambulatory Visit: Payer: Self-pay | Admitting: Internal Medicine

## 2011-06-23 ENCOUNTER — Ambulatory Visit (INDEPENDENT_AMBULATORY_CARE_PROVIDER_SITE_OTHER): Payer: Medicare Other | Admitting: Internal Medicine

## 2011-06-23 ENCOUNTER — Ambulatory Visit (INDEPENDENT_AMBULATORY_CARE_PROVIDER_SITE_OTHER)
Admission: RE | Admit: 2011-06-23 | Discharge: 2011-06-23 | Disposition: A | Discharge status: Home / Self Care | Payer: Medicare Other | Source: Ambulatory Visit | Attending: Internal Medicine | Admitting: Internal Medicine

## 2011-06-23 ENCOUNTER — Encounter: Payer: Self-pay | Admitting: Internal Medicine

## 2011-06-23 VITALS — BP 122/72 | HR 105 | Ht 62.0 in | Wt 119.2 lb

## 2011-06-23 DIAGNOSIS — I259 Chronic ischemic heart disease, unspecified: Secondary | ICD-10-CM

## 2011-06-23 DIAGNOSIS — R0789 Other chest pain: Secondary | ICD-10-CM

## 2011-06-23 DIAGNOSIS — J449 Chronic obstructive pulmonary disease, unspecified: Secondary | ICD-10-CM

## 2011-06-23 DIAGNOSIS — F17200 Nicotine dependence, unspecified, uncomplicated: Secondary | ICD-10-CM

## 2011-06-23 DIAGNOSIS — Z23 Encounter for immunization: Secondary | ICD-10-CM

## 2011-06-23 DIAGNOSIS — I2510 Atherosclerotic heart disease of native coronary artery without angina pectoris: Secondary | ICD-10-CM

## 2011-06-23 DIAGNOSIS — R079 Chest pain, unspecified: Secondary | ICD-10-CM

## 2011-06-23 NOTE — Assessment & Plan Note (Signed)
We will update PFT and CXR She is worried about stiffness in neck, nonexertional numb episodes in arms, sore spot left of sternum and tenderness in legs. I think some of this is arthritic, some may be early PAD(legs) Son had MI and she admits some anxiety about this issue- I offerred cardiology.  Plan- flu vax

## 2011-06-23 NOTE — Patient Instructions (Signed)
Order- PFT             CXR-    COPD  Flu vax  Try heat and soft collar on your stiff neck.  I think it would be good to see a cardiologist for risk assessment of coronary disease.

## 2011-06-23 NOTE — Progress Notes (Signed)
Patient ID: [name], female    DOB: 1946-03-20, 65 y.o.   MRN: [id number]  HPI 65 yoF smoker followed for COPD, tobacco dependence and cough. Centricity EMR reviewed for conversion to Epic.Has had extensive efforts at smoking counseling (husband died of tobacco induced lung cancer).  Comes today with increased cough since March 2, with concerns of relation to mold abatement being done in home. Refrigerator line leaked, later floor buckled and on removal, mold found beneath flooring. Thinks she may have coughed more since recent water leak onto kitchen floor. Denies fever, sore throat or obvious infection. Denies reflux. Has been more wheezey and short of breath Using her Advair and using Combivent 3 x/ day.   06/23/11- 64 yoF smoker followed for COPD, tobacco dependence and cough. She has had additional stress recently, son just had an MI and coronary stent. She has not tried to stop smoking despite counseling. She found Symbicort to be no better than Advair. Changing from a dry powder inhaler had no effect on her cough. Using rescue inhaler a little more. New complaint: Feels tight through the neck and unable to relax her neck and shoulders. A neck brace helps.  Review of Systems Constitutional:   No-   weight loss, night sweats, fevers, chills, fatigue, lassitude. HEENT:   No-  headaches, difficulty swallowing, tooth/dental problems, sore throat,       No-  sneezing, itching, ear ache, nasal congestion, post nasal drip,  CV:  + Shooting pains through sternum chest pain, orthopnea, PND, swelling in lower extremities, anasarca, dizziness, palpitations. + Arms get numb but not definitely exertional. Resp: No-acute   shortness of breath with exertion or at rest.              No- change  productive cough,  No non-productive cough,  No-  coughing up of blood.              No-   change in color of mucus.  No- wheezing.   Skin: No-   rash or lesions. GI:  No-   heartburn, indigestion,  abdominal pain, nausea, vomiting, diarrhea,                 change in bowel habits, loss of appetite GU: No-   dysuria, change in color of urine, no urgency or frequency.  No- flank pain. MS:  No-   joint pain or swelling.  No- decreased range of motion.  No- back pain. Neuro-  Psych:  No- change in mood or affect. No depression or anxiety.  No memory loss.      Objective:   Physical Exam General- Alert, Oriented, Affect-appropriate, Distress- none acute Skin- rash-none, lesions- none, excoriation- none Lymphadenopathy- none Head- atraumatic            Eyes- Gross vision intact, PERRLA, conjunctivae clear secretions            Ears- Hearing, canals-normal            Nose- Clear, no-Septal dev, mucus, polyps, erosion, perforation             Throat- Mallampati II , mucosa clear , drainage- none, tonsils- atrophic. Frequent throat clearing.  Neck- she sits supporting her chin on her hand and is somewhat hunched shoulders. , trachea midline, no stridor , thyroid nl, carotid no bruit. I get the impression she is using shoulder muscles to help keep her chest a little inflated, but there is also a mild thoracic kyphosis  developing Chest - symmetrical excursion , unlabored           Heart/CV- RRR , no murmur , no gallop  , no rub, nl s1 s2                           - JVD- none , edema- none, stasis changes- none, varices- none           Lung- distant, coarse breath sounds, wheeze- none, cough- none , dullness-none, rub- none           Chest wall-  Abd- tender-no, distended-no, bowel sounds-present, HSM- no Br/ Gen/ Rectal- Not done, not indicated Extrem- cyanosis- none, clubbing, none, atrophy- none, strength- nl Neuro- grossly intact to observation

## 2011-06-26 ENCOUNTER — Encounter: Payer: Self-pay | Admitting: Internal Medicine

## 2011-06-26 DIAGNOSIS — R0789 Other chest pain: Secondary | ICD-10-CM | POA: Insufficient documentation

## 2011-06-26 NOTE — Assessment & Plan Note (Signed)
She has made no effort to quit and has been on responsive to my efforts at education referral to smoking cessation. Husband died of lung cancer, son had MI

## 2011-06-26 NOTE — Assessment & Plan Note (Addendum)
Her description of chest pain is atypical. There is certainly a risk including smoking and family history, for cardiovascular disease. Differential I think includes cervical arthritis with nerve root compression. Plan-we will ask risk factor stratification by cardiology. She can try heat on her neck and a soft cervical collar for use at times at home. Physical therapy or massage may help. Her primary physician may want to x-ray her neck

## 2011-07-06 LAB — BLOOD GAS, ARTERIAL
Bicarbonate: 22.9
Bicarbonate: 23.8
Bicarbonate: 24.2 — ABNORMAL HIGH
FIO2: 0.5
O2 Saturation: 85.9
O2 Saturation: 89.1
PEEP: 7
PEEP: 8
RATE: 12
pCO2 arterial: 31.8 — ABNORMAL LOW
pCO2 arterial: 50.8 — ABNORMAL HIGH
pH, Arterial: 7.292 — ABNORMAL LOW
pH, Arterial: 7.351
pO2, Arterial: 143 — ABNORMAL HIGH
pO2, Arterial: 51.4 — ABNORMAL LOW
pO2, Arterial: 58.5 — ABNORMAL LOW

## 2011-07-06 LAB — CARDIAC PANEL(CRET KIN+CKTOT+MB+TROPI)
CK, MB: 4.3 — ABNORMAL HIGH
Relative Index: INVALID
Relative Index: INVALID
Total CK: 101
Total CK: 108
Total CK: 76
Total CK: 88
Troponin I: 0.02
Troponin I: 0.02
Troponin I: 0.04

## 2011-07-06 LAB — GRAM STAIN

## 2011-07-06 LAB — CBC
HCT: 37.2
Hemoglobin: 12.8
Hemoglobin: 14
MCHC: 34.7
RDW: 13.5
WBC: 14.5 — ABNORMAL HIGH

## 2011-07-06 LAB — BASIC METABOLIC PANEL
CO2: 27
Calcium: 8.2 — ABNORMAL LOW
GFR calc Af Amer: >60
GFR calc non Af Amer: >60
Glucose, Bld: 163 — ABNORMAL HIGH
Potassium: 3.7
Sodium: 139
Sodium: 139

## 2011-07-06 LAB — FUNGAL STAIN

## 2011-07-06 LAB — VIRUS CULTURE

## 2011-07-06 LAB — LIPID PANEL
LDL Cholesterol: 96
VLDL: 39

## 2011-07-06 LAB — AFB CULTURE WITH SMEAR (NOT AT ARMC): Acid Fast Smear: NONE SEEN

## 2011-07-06 LAB — BODY FLUID CELL COUNT WITH DIFFERENTIAL
Monocyte-Macrophage-Serous Fluid: 1 — ABNORMAL LOW
Total Nucleated Cell Count, Fluid: 2150 — ABNORMAL HIGH

## 2011-07-06 LAB — CULTURE, RESPIRATORY W GRAM STAIN

## 2011-07-07 ENCOUNTER — Ambulatory Visit (INDEPENDENT_AMBULATORY_CARE_PROVIDER_SITE_OTHER): Payer: Medicare Other | Admitting: Internal Medicine

## 2011-07-07 DIAGNOSIS — J449 Chronic obstructive pulmonary disease, unspecified: Secondary | ICD-10-CM

## 2011-07-07 LAB — CK TOTAL AND CKMB (NOT AT ARMC)
CK, MB: 2.8
Relative Index: 1.7
Total CK: 168

## 2011-07-07 LAB — BASIC METABOLIC PANEL
CO2: 24
GFR calc non Af Amer: >60
Glucose, Bld: 177 — ABNORMAL HIGH
Potassium: 3.6
Sodium: 137

## 2011-07-07 LAB — DIFFERENTIAL
Basophils Absolute: 0.1
Eosinophils Relative: 0
Lymphocytes Relative: 9 — ABNORMAL LOW
Monocytes Absolute: 0.6
Monocytes Relative: 3

## 2011-07-07 LAB — CBC
HCT: 43.1
Hemoglobin: 15
MCHC: 34.9
RDW: 13.1

## 2011-07-07 LAB — POCT CARDIAC MARKERS: CKMB, poc: 1.6

## 2011-07-07 LAB — PULMONARY FUNCTION TEST

## 2011-07-07 NOTE — Progress Notes (Signed)
PFT done today. 

## 2011-07-21 ENCOUNTER — Institutional Professional Consult (permissible substitution): Payer: Medicare Other | Admitting: Cardiovascular Disease

## 2011-08-10 ENCOUNTER — Telehealth: Payer: Self-pay | Admitting: Internal Medicine

## 2011-08-10 MED ORDER — AZITHROMYCIN 250 MG PO TABS: ORAL_TABLET | ORAL | Status: DC

## 2011-08-10 MED ORDER — PROMETHAZINE-CODEINE 6.25-10 MG/5ML PO SYRP: ORAL_SOLUTION | ORAL | Status: DC

## 2011-08-10 NOTE — Telephone Encounter (Signed)
Per CY-offer Zpak #1 take as directed no refills; and phenergan with codeine cough syurp #242ml 1 tsp every 6 hours prn no refills.

## 2011-08-10 NOTE — Telephone Encounter (Signed)
I spoke with [name] and she c/o cough w/ white phlem, sore throat, runny nose, increase SOB w/ activity and rest x 3 days. [name] states her cough keeps her up all night and not able to sleep. [name] denies any fever, nausea, vomiting. [name] states she has been taking OTC mucinex 600 mg 1 po BID. [name] is requesting further recs from Dr. Maple Hudson. Please advise Thanks  No Known Allergies  Carver Fila, CMA

## 2011-08-10 NOTE — Telephone Encounter (Signed)
I spoke with pt and is aware of cdy recs. Rx has been called into pharmacy for pt

## 2011-08-14 ENCOUNTER — Encounter: Payer: Self-pay | Admitting: Internal Medicine

## 2011-08-14 ENCOUNTER — Ambulatory Visit (INDEPENDENT_AMBULATORY_CARE_PROVIDER_SITE_OTHER): Payer: Medicare Other | Admitting: Internal Medicine

## 2011-08-14 ENCOUNTER — Ambulatory Visit (INDEPENDENT_AMBULATORY_CARE_PROVIDER_SITE_OTHER)
Admission: RE | Admit: 2011-08-14 | Discharge: 2011-08-14 | Disposition: A | Discharge status: Home / Self Care | Payer: Medicare Other | Source: Ambulatory Visit | Attending: Internal Medicine | Admitting: Internal Medicine

## 2011-08-14 ENCOUNTER — Telehealth: Payer: Self-pay | Admitting: Internal Medicine

## 2011-08-14 VITALS — BP 138/76 | HR 90 | Ht 62.0 in | Wt 120.4 lb

## 2011-08-14 DIAGNOSIS — J449 Chronic obstructive pulmonary disease, unspecified: Secondary | ICD-10-CM

## 2011-08-14 DIAGNOSIS — F17200 Nicotine dependence, unspecified, uncomplicated: Secondary | ICD-10-CM

## 2011-08-14 MED ORDER — COMPRESSOR/NEBULIZER MISC: 1.0000 | Freq: Four times a day (QID) | Status: DC | PRN

## 2011-08-14 MED ORDER — ALBUTEROL SULFATE (2.5 MG/3ML) 0.083% IN NEBU: 2.5000 mg | INHALATION_SOLUTION | Freq: Four times a day (QID) | RESPIRATORY_TRACT | Status: DC | PRN

## 2011-08-14 MED ORDER — LEVALBUTEROL HCL 0.63 MG/3ML IN NEBU
0.6300 mg | INHALATION_SOLUTION | Freq: Once | RESPIRATORY_TRACT | Status: AC
  Administered 2011-08-14: 0.63 mg via RESPIRATORY_TRACT

## 2011-08-14 MED ORDER — PREDNISONE 10 MG PO TABS: ORAL_TABLET | ORAL | Status: AC

## 2011-08-14 MED ORDER — AMOXICILLIN-POT CLAVULANATE 875-125 MG PO TABS: 1.0000 | ORAL_TABLET | Freq: Two times a day (BID) | ORAL | Status: AC

## 2011-08-14 MED ORDER — METHYLPREDNISOLONE ACETATE 80 MG/ML IJ SUSP
80.0000 mg | Freq: Once | INTRAMUSCULAR | Status: AC
  Administered 2011-08-14: 80 mg via INTRAMUSCULAR

## 2011-08-14 NOTE — Telephone Encounter (Signed)
I spoke with the patient and she states today will be her last dose of Zpak and she states her symptoms are worse.  She states she is having increased SOB since starting the zpak. She also states she is still having productive cough with yellow phlegm. She is having to use her albuterol inhaler every 4 hours on schedule.  Pt request an appt today. I advised there are no available appts today, but I will send a message to Dr. Maple Hudson for his recs.  Please advise. Carron Curie, CMA No Known Allergies

## 2011-08-14 NOTE — Telephone Encounter (Signed)
Suggest augmentin 875 mg, # 14, 1 twice daily                 Prednisone 10 mg, # 20, 4 X 2 DAYS, 3 X 2 DAYS, 2 X 2 DAYS, 1 X 2 DAYS

## 2011-08-14 NOTE — Progress Notes (Signed)
Patient ID: [name], female    DOB: 1945/12/01, 65 y.o.   MRN: [id number]  HPI 65 yoF smoker followed for COPD, tobacco dependence and cough. Centricity EMR reviewed for conversion to Epic.Has had extensive efforts at smoking counseling (husband died of tobacco induced lung cancer).  Comes today with increased cough since March 2, with concerns of relation to mold abatement being done in home. Refrigerator line leaked, later floor buckled and on removal, mold found beneath flooring. Thinks she may have coughed more since recent water leak onto kitchen floor. Denies fever, sore throat or obvious infection. Denies reflux. Has been more wheezey and short of breath Using her Advair and using Combivent 3 x/ day.   06/23/11- 65 yoF smoker followed for COPD, tobacco dependence and cough. She has had additional stress recently, son just had an MI and coronary stent. She has not tried to stop smoking despite counseling. She found Symbicort to be no better than Advair. Changing from a dry powder inhaler had no effect on her cough. Using rescue inhaler a little more. New complaint: Feels tight through the neck and unable to relax her neck and shoulders. A neck brace helps.  08/14/11 - 65 yoF smoker followed for COPD, tobacco dependence and cough. Acute visit- she is still smoking against advice. Has had flu vaccine. Now complains of acute illness for the past week. Chest tight, short of breath. Cough was productive but now is dry. Denies fever, chest pain blood or palpitation. 5 days ago and she accepted a prescription from [place] for Z-Pak, just finished today, but she says "it never helps". She is particularly concerned about recurrence of mucus plugging, since she understands that was the problem related to a sustained exacerbation with hospital stay in the past. She is using her rescue inhaler every 4 hours. CXR- 06/26/11- stable COPD, NAD.  Review of Systems Constitutional:   No-   weight loss,  night sweats, fevers, chills, fatigue, lassitude. HEENT:   No-  headaches, difficulty swallowing, tooth/dental problems, sore throat,       No-  sneezing, itching, ear ache, nasal congestion, post nasal drip,  CV:  No- chest pain, orthopnea, PND, swelling in lower extremities, anasarca, dizziness, palpitations.  Resp: Per HPI-   shortness of breath with exertion or at rest.              No- change  productive cough,  + non-productive cough,  No-  coughing up of blood.              No-   change in color of mucus.  + wheezing.   Skin: No-   rash or lesions. GI:  No-   heartburn, indigestion, abdominal pain, nausea, vomiting, diarrhea,                 change in bowel habits, loss of appetite GU: No-   dysuria, change in color of urine, no urgency or frequency.  No- flank pain. MS:  No-   joint pain or swelling.  No- decreased range of motion.  No- back pain. Neuro-  Psych:  No- change in mood or affect. No depression or anxiety.  No memory loss.      Objective:   Physical Exam General- Alert, Oriented, Affect-appropriate, Distress- none acute Skin- rash-none, lesions- none, excoriation- none Lymphadenopathy- none Head- atraumatic            Eyes- Gross vision intact, PERRLA, conjunctivae clear secretions  Ears- Hearing, canals-normal            Nose- Clear, no-Septal dev, mucus, polyps, erosion, perforation. Sniffing.             Throat- Mallampati II , mucosa clear , drainage- none, tonsils- atrophic. Frequent throat clearing.  Neck-  trachea midline, no stridor , thyroid nl, carotid no bruit. Chest - symmetrical excursion , unlabored           Heart/CV- RRR , no murmur , no gallop  , no rub, nl s1 s2                           - JVD- none , edema- none, stasis changes- none, varices- none           Lung- distant,tight wheeze, cough- none , dullness-none, rub- none           Chest wall-  Abd- tender-no, distended-no, bowel sounds-present, HSM- no Br/ Gen/ Rectal- Not done,  not indicated Extrem- cyanosis- none, clubbing, none, atrophy- none, strength- nl Neuro- grossly intact to observation

## 2011-08-14 NOTE — Patient Instructions (Signed)
Neb xop 0.63  Depo 80  Scripts printed for a nebulizer and med for it  Scripts sent for antibiotic and prednisone  Order CXR- dx exacerbation COPD

## 2011-08-14 NOTE — Telephone Encounter (Signed)
Patient called again for recs.  Advised CDY okayed augmentin and pred taper but patient stated that she really feels she should be seen today and does not want to go to the ER.  CDY had cancellation this afternoon at 3pm and CDY okayed for patient to be added on.  Pt okay with appt time.

## 2011-08-16 NOTE — Assessment & Plan Note (Signed)
I encouraged her again to take this opportunity to stop smoking permanently.

## 2011-08-16 NOTE — Assessment & Plan Note (Signed)
Acute bronchitic exacerbation of COPD. Her particular concern is with mucus plugging. Plan-Mucinex, fluids, nebulizer treatment here with steroid injection, prednisone taper, Augmentin, chest x-ray. We began discussion of home nebulizer and assessment for oxygen. She is not eager and will need some more discussion when she is feeling a little more stable.

## 2011-09-10 ENCOUNTER — Telehealth: Payer: Self-pay | Admitting: Internal Medicine

## 2011-09-10 NOTE — Telephone Encounter (Signed)
I advised pt 1 sample will be left upfront for p/u. Nothing further was needed

## 2011-09-14 ENCOUNTER — Telehealth: Payer: Self-pay | Admitting: Internal Medicine

## 2011-09-14 MED ORDER — PREDNISONE 10 MG PO TABS: ORAL_TABLET | ORAL | Status: DC

## 2011-09-14 NOTE — Telephone Encounter (Signed)
Called and spoke with pt and she stated that she started amoxicillin on sat and her breathing is just not better.  She is requesting rx for pred taper to be sent in to her pharmacy.  CY please advise. Thanks  No Known Allergies

## 2011-09-14 NOTE — Telephone Encounter (Signed)
Per CY-okay to give Prednisone 10mg  #32 take 60mg  x 2 days, 40mg  x 2 days, 30mg  x 2 days, 20mg  x 2 days, 10mg  x 2 days then stop no refills.

## 2011-09-14 NOTE — Telephone Encounter (Signed)
Called and spoke with pt and she is aware per CY recs that the prednisone taper has been sent to the pharmacy.  Pt voiced her understanding .

## 2011-10-01 ENCOUNTER — Encounter: Payer: Self-pay | Admitting: Internal Medicine

## 2011-10-01 ENCOUNTER — Ambulatory Visit (INDEPENDENT_AMBULATORY_CARE_PROVIDER_SITE_OTHER): Payer: Medicare Other | Admitting: Internal Medicine

## 2011-10-01 VITALS — BP 122/64 | HR 105 | Ht 62.0 in | Wt 121.2 lb

## 2011-10-01 DIAGNOSIS — F17200 Nicotine dependence, unspecified, uncomplicated: Secondary | ICD-10-CM

## 2011-10-01 DIAGNOSIS — R0989 Other specified symptoms and signs involving the circulatory and respiratory systems: Secondary | ICD-10-CM

## 2011-10-01 DIAGNOSIS — J449 Chronic obstructive pulmonary disease, unspecified: Secondary | ICD-10-CM

## 2011-10-01 DIAGNOSIS — R0600 Dyspnea, unspecified: Secondary | ICD-10-CM

## 2011-10-01 MED ORDER — IPRATROPIUM-ALBUTEROL 20-100 MCG/ACT IN AERS: 1.0000 | INHALATION_SPRAY | Freq: Four times a day (QID) | RESPIRATORY_TRACT | Status: DC | PRN

## 2011-10-01 MED ORDER — IPRATROPIUM-ALBUTEROL 0.5-2.5 (3) MG/3ML IN SOLN: 3.0000 mL | RESPIRATORY_TRACT | Status: DC | PRN

## 2011-10-01 NOTE — Progress Notes (Signed)
Patient ID: [name], female    DOB: May 31, 1946, 66 y.o.   MRN: [id number]  HPI 74 yoF smoker followed for COPD, tobacco dependence and cough. [employer] EMR reviewed for conversion to Epic.Has had extensive efforts at smoking counseling (husband died of tobacco induced lung cancer).  Comes today with increased cough since March 2, with concerns of relation to mold abatement being done in home. Refrigerator line leaked, later floor buckled and on removal, mold found beneath flooring. Thinks she may have coughed more since recent water leak onto kitchen floor. Denies fever, sore throat or obvious infection. Denies reflux. Has been more wheezey and short of breath Using her Advair and using Combivent 3 x/ day.   06/23/11- 64 yoF smoker followed for COPD, tobacco dependence and cough. She has had additional stress recently, son just had an MI and coronary stent. She has not tried to stop smoking despite counseling. She found Symbicort to be no better than Advair. Changing from a dry powder inhaler had no effect on her cough. Using rescue inhaler a little more. New complaint: Feels tight through the neck and unable to relax her neck and shoulders. A neck brace helps.  08/14/11 - 64 yoF smoker followed for COPD, tobacco dependence and cough. Acute visit- she is still smoking against advice. Has had flu vaccine. Now complains of acute illness for the past week. Chest tight, short of breath. Cough was productive but now is dry. Denies fever, chest pain blood or palpitation. 5 days ago and she accepted a prescription from [place] for Z-Pak, just finished today, but she says "it never helps". She is particularly concerned about recurrence of mucus plugging, since she understands that was the problem related to a sustained exacerbation with hospital stay in the past. She is using her rescue inhaler every 4 hours. CXR- 06/26/11- stable COPD, NAD.  10/01/11- 64 yoF smoker followed for COPD, tobacco dependence  and cough.  Increased SOB with walking or at rest-gotten worse since Christmas; prior to this was sick 08-14-2011-never regrouped from this. Wakes up at night gasping for air-has had to use rescue inhaler a few times when this happens. She finished prednisone and antibiotic but we had called in December 24 and has not been off of those for about 2 weeks. Stays for a short of breath as described. We discussed pending change of Combivent to the new delivery device. She is anxious about any change from familiar. Using Xanax about once daily. PFT- 07/07/11- severe obstructive airways disease with response to bronchodilator, hyperinflation, diffusion moderately reduced. FEV1 0.73/38%, FEV1/FVC 0.37, DLCO 45%. Loop configuration is emphysema.  Review of Systems Constitutional:   No-   weight loss, night sweats, fevers, chills, fatigue, lassitude. HEENT:   No-  headaches, difficulty swallowing, tooth/dental problems, sore throat,       No-  sneezing, itching, ear ache, nasal congestion, post nasal drip,  CV:  No- chest pain, orthopnea, PND, swelling in lower extremities, anasarca, dizziness, palpitations.  Resp: Per HPI-   shortness of breath with exertion or at rest.              No- change  productive cough,  + non-productive cough,  No-  coughing up of blood.              No-   change in color of mucus.  + wheezing.   Skin: No-   rash or lesions. GI:  No-   heartburn, indigestion, abdominal pain, nausea, vomiting, diarrhea,  change in bowel habits, loss of appetite GU: MS:  No-   joint pain or swelling.  No- decreased range of motion.  No- back pain. Neuro-  Psych:  No- change in mood or affect. No depression or anxiety.  No memory loss.      Objective:   Physical Exam General- Alert, Oriented, Affect-mildly anxious, Distress- none acute, conversational Skin- rash-none, lesions- none, excoriation- none Lymphadenopathy- none Head- atraumatic            Eyes- Gross vision  intact, PERRLA, conjunctivae clear secretions            Ears- Hearing, canals-normal            Nose- Clear, no-Septal dev, mucus, polyps, erosion, perforation. Sniffing.             Throat- Mallampati II , mucosa clear , drainage- none, tonsils- atrophic. Frequent throat clearing.  Neck-  trachea midline, no stridor , thyroid nl, carotid no bruit. Chest - symmetrical excursion , unlabored           Heart/CV- RRR , no murmur , no gallop  , no rub, nl s1 s2                           - JVD- none , edema- none, stasis changes- none, varices- none           Lung- wheeze w/ deep breath, unlabored, cough- none , dullness-none, rub- none           Chest wall-  Abd- Br/ Gen/ Rectal- Not done, not indicated Extrem- cyanosis- none, clubbing, none, atrophy- none, strength- nl Neuro- grossly intact to observation

## 2011-10-01 NOTE — Patient Instructions (Addendum)
Try sample Combivent Respimat  1 inhalation, twice daily, instead of Combivent  Try script for albuterol plus ipratropium nebulizer solution, instead of plain albuterol     Order- schedule 2 D echocardiogram     Dyspnea, question pulmonary hypertension  Order- DME- home oxygen saturation assessment- rest, exertion, sleep on room air    Dx COPD

## 2011-10-03 NOTE — Assessment & Plan Note (Addendum)
Severe COPD, mainly emphysema. There is a reactive component we can work with. Smoking cessation remains key, but she has not been willing to this point. We will update oxygen assessment and look for evidence of pulmonary hypertension on echocardiogram.

## 2011-10-03 NOTE — Assessment & Plan Note (Signed)
I pointed out again today that cigarette smoking is using about long she has left.

## 2011-10-06 ENCOUNTER — Ambulatory Visit (HOSPITAL_COMMUNITY): Payer: Medicare Other | Attending: Cardiovascular Disease

## 2011-10-06 DIAGNOSIS — J4489 Other specified chronic obstructive pulmonary disease: Secondary | ICD-10-CM | POA: Insufficient documentation

## 2011-10-06 DIAGNOSIS — R0609 Other forms of dyspnea: Secondary | ICD-10-CM | POA: Insufficient documentation

## 2011-10-06 DIAGNOSIS — R0600 Dyspnea, unspecified: Secondary | ICD-10-CM

## 2011-10-06 DIAGNOSIS — F17200 Nicotine dependence, unspecified, uncomplicated: Secondary | ICD-10-CM | POA: Insufficient documentation

## 2011-10-06 DIAGNOSIS — J449 Chronic obstructive pulmonary disease, unspecified: Secondary | ICD-10-CM | POA: Insufficient documentation

## 2011-10-06 DIAGNOSIS — R0989 Other specified symptoms and signs involving the circulatory and respiratory systems: Secondary | ICD-10-CM | POA: Insufficient documentation

## 2011-10-08 ENCOUNTER — Inpatient Hospital Stay (HOSPITAL_COMMUNITY)
Admission: EM | Admit: 2011-10-08 | Discharge: 2011-10-12 | DRG: 192 | Disposition: A | Discharge status: Home / Self Care | Payer: Medicare Other | Attending: Internal Medicine | Admitting: Internal Medicine

## 2011-10-08 ENCOUNTER — Emergency Department (HOSPITAL_COMMUNITY): Payer: Medicare Other

## 2011-10-08 ENCOUNTER — Encounter (HOSPITAL_COMMUNITY): Payer: Self-pay | Admitting: Emergency Medicine

## 2011-10-08 ENCOUNTER — Other Ambulatory Visit: Payer: Self-pay

## 2011-10-08 DIAGNOSIS — F17200 Nicotine dependence, unspecified, uncomplicated: Secondary | ICD-10-CM

## 2011-10-08 DIAGNOSIS — J9611 Chronic respiratory failure with hypoxia: Secondary | ICD-10-CM | POA: Insufficient documentation

## 2011-10-08 DIAGNOSIS — IMO0002 Reserved for concepts with insufficient information to code with codable children: Secondary | ICD-10-CM

## 2011-10-08 DIAGNOSIS — J441 Chronic obstructive pulmonary disease with (acute) exacerbation: Secondary | ICD-10-CM

## 2011-10-08 DIAGNOSIS — J449 Chronic obstructive pulmonary disease, unspecified: Secondary | ICD-10-CM | POA: Diagnosis present

## 2011-10-08 DIAGNOSIS — F411 Generalized anxiety disorder: Secondary | ICD-10-CM | POA: Diagnosis present

## 2011-10-08 DIAGNOSIS — F17201 Nicotine dependence, unspecified, in remission: Secondary | ICD-10-CM | POA: Diagnosis present

## 2011-10-08 DIAGNOSIS — R0602 Shortness of breath: Secondary | ICD-10-CM

## 2011-10-08 DIAGNOSIS — J156 Pneumonia due to other aerobic Gram-negative bacteria: Secondary | ICD-10-CM

## 2011-10-08 DIAGNOSIS — Z79899 Other long term (current) drug therapy: Secondary | ICD-10-CM

## 2011-10-08 DIAGNOSIS — I10 Essential (primary) hypertension: Secondary | ICD-10-CM | POA: Diagnosis present

## 2011-10-08 HISTORY — DX: Dependence on respirator (ventilator) status: Z99.11

## 2011-10-08 HISTORY — DX: Shortness of breath: R06.02

## 2011-10-08 HISTORY — DX: Pneumonia, unspecified organism: J18.9

## 2011-10-08 HISTORY — DX: Personal history of other diseases of the respiratory system: Z87.09

## 2011-10-08 LAB — CBC
HCT: 42.9 % (ref 36.0–46.0)
Hemoglobin: 13.9 g/dL (ref 12.0–15.0)
MCH: 30.5 pg (ref 26.0–34.0)
MCH: 30.6 pg (ref 26.0–34.0)
MCHC: 34.3 g/dL (ref 30.0–36.0)
MCV: 89.2 fL (ref 78.0–100.0)
Platelets: 253 10*3/uL (ref 150–400)
RBC: 4.54 MIL/uL (ref 3.87–5.11)
RDW: 12.4 % (ref 11.5–15.5)
WBC: 9 10*3/uL (ref 4.0–10.5)

## 2011-10-08 LAB — COMPREHENSIVE METABOLIC PANEL
BUN: 6 mg/dL (ref 6–23)
CO2: 28 mEq/L (ref 19–32)
Calcium: 9.7 mg/dL (ref 8.4–10.5)
Creatinine, Ser: 0.55 mg/dL (ref 0.50–1.10)
GFR calc Af Amer: >90 mL/min (ref >90)
GFR calc non Af Amer: >90 mL/min (ref >90)
Glucose, Bld: 99 mg/dL (ref 70–99)
Sodium: 137 mEq/L (ref 135–145)
Total Protein: 6.9 g/dL (ref 6.0–8.3)

## 2011-10-08 LAB — LEGIONELLA ANTIGEN, URINE

## 2011-10-08 LAB — CARDIAC PANEL(CRET KIN+CKTOT+MB+TROPI)
CK, MB: 4.7 ng/mL — ABNORMAL HIGH (ref 0.3–4.0)
Relative Index: INVALID (ref 0.0–2.5)
Total CK: 86 U/L (ref 7–177)
Troponin I: <0.3 ng/mL (ref <0.30)

## 2011-10-08 LAB — PROCALCITONIN: Procalcitonin: <0.1 ng/mL

## 2011-10-08 LAB — CREATININE, SERUM
Creatinine, Ser: 0.59 mg/dL (ref 0.50–1.10)
GFR calc Af Amer: >90 mL/min (ref >90)

## 2011-10-08 LAB — RAPID STREP SCREEN (MED CTR MEBANE ONLY): Streptococcus, Group A Screen (Direct): NEGATIVE

## 2011-10-08 MED ORDER — AMLODIPINE BESYLATE 5 MG PO TABS
5.0000 mg | ORAL_TABLET | Freq: Every day | ORAL | Status: DC
  Administered 2011-10-08 – 2011-10-12 (×5): 5 mg via ORAL
  Filled 2011-10-08 (×5): qty 1

## 2011-10-08 MED ORDER — ACETAMINOPHEN 325 MG PO TABS: 650.0000 mg | ORAL_TABLET | Freq: Four times a day (QID) | ORAL | Status: DC | PRN

## 2011-10-08 MED ORDER — ALBUTEROL SULFATE (5 MG/ML) 0.5% IN NEBU
5.0000 mg | INHALATION_SOLUTION | Freq: Once | RESPIRATORY_TRACT | Status: AC
  Administered 2011-10-08: 5 mg via RESPIRATORY_TRACT
  Filled 2011-10-08 (×2): qty 0.5

## 2011-10-08 MED ORDER — IPRATROPIUM BROMIDE 0.02 % IN SOLN
0.5000 mg | Freq: Four times a day (QID) | RESPIRATORY_TRACT | Status: DC
  Administered 2011-10-08 – 2011-10-12 (×13): 0.5 mg via RESPIRATORY_TRACT
  Filled 2011-10-08 (×13): qty 2.5

## 2011-10-08 MED ORDER — MOXIFLOXACIN HCL IN NACL 400 MG/250ML IV SOLN
400.0000 mg | INTRAVENOUS | Status: DC
  Administered 2011-10-08 – 2011-10-11 (×4): 400 mg via INTRAVENOUS
  Filled 2011-10-08 (×5): qty 250

## 2011-10-08 MED ORDER — LEVALBUTEROL HCL 0.63 MG/3ML IN NEBU
0.6300 mg | INHALATION_SOLUTION | RESPIRATORY_TRACT | Status: DC | PRN
  Administered 2011-10-09: 0.63 mg via RESPIRATORY_TRACT
  Filled 2011-10-08: qty 3

## 2011-10-08 MED ORDER — BUDESONIDE-FORMOTEROL FUMARATE 160-4.5 MCG/ACT IN AERO
2.0000 | INHALATION_SPRAY | Freq: Two times a day (BID) | RESPIRATORY_TRACT | Status: DC
  Administered 2011-10-08 – 2011-10-12 (×8): 2 via RESPIRATORY_TRACT
  Filled 2011-10-08: qty 6

## 2011-10-08 MED ORDER — IPRATROPIUM BROMIDE 0.02 % IN SOLN
0.5000 mg | RESPIRATORY_TRACT | Status: DC | PRN
  Administered 2011-10-09: 0.5 mg via RESPIRATORY_TRACT
  Filled 2011-10-08: qty 2.5

## 2011-10-08 MED ORDER — ONDANSETRON HCL 4 MG PO TABS: 4.0000 mg | ORAL_TABLET | Freq: Four times a day (QID) | ORAL | Status: DC | PRN

## 2011-10-08 MED ORDER — NICOTINE 14 MG/24HR TD PT24
14.0000 mg | MEDICATED_PATCH | Freq: Every day | TRANSDERMAL | Status: DC
  Administered 2011-10-08 – 2011-10-12 (×5): 14 mg via TRANSDERMAL
  Filled 2011-10-08 (×6): qty 1

## 2011-10-08 MED ORDER — LEVALBUTEROL HCL 0.63 MG/3ML IN NEBU
0.6300 mg | INHALATION_SOLUTION | Freq: Four times a day (QID) | RESPIRATORY_TRACT | Status: DC
  Administered 2011-10-08 – 2011-10-12 (×13): 0.63 mg via RESPIRATORY_TRACT
  Filled 2011-10-08 (×18): qty 3

## 2011-10-08 MED ORDER — ENOXAPARIN SODIUM 40 MG/0.4ML ~~LOC~~ SOLN
40.0000 mg | SUBCUTANEOUS | Status: DC
  Administered 2011-10-08 – 2011-10-11 (×4): 40 mg via SUBCUTANEOUS
  Filled 2011-10-08 (×5): qty 0.4

## 2011-10-08 MED ORDER — IPRATROPIUM BROMIDE 0.02 % IN SOLN: 0.5000 mg | RESPIRATORY_TRACT | Status: DC | PRN

## 2011-10-08 MED ORDER — ALUM & MAG HYDROXIDE-SIMETH 200-200-20 MG/5ML PO SUSP: 30.0000 mL | Freq: Four times a day (QID) | ORAL | Status: DC | PRN

## 2011-10-08 MED ORDER — ALPRAZOLAM 0.5 MG PO TABS
0.5000 mg | ORAL_TABLET | Freq: Two times a day (BID) | ORAL | Status: DC
  Administered 2011-10-08 – 2011-10-12 (×8): 0.5 mg via ORAL
  Filled 2011-10-08 (×8): qty 1

## 2011-10-08 MED ORDER — ONDANSETRON HCL 4 MG/2ML IJ SOLN: 4.0000 mg | Freq: Four times a day (QID) | INTRAMUSCULAR | Status: DC | PRN

## 2011-10-08 MED ORDER — HYDROCODONE-ACETAMINOPHEN 5-325 MG PO TABS: 1.0000 | ORAL_TABLET | ORAL | Status: DC | PRN

## 2011-10-08 MED ORDER — LEVALBUTEROL HCL 0.63 MG/3ML IN NEBU: 0.6300 mg | INHALATION_SOLUTION | RESPIRATORY_TRACT | Status: DC | PRN

## 2011-10-08 MED ORDER — GUAIFENESIN-DM 100-10 MG/5ML PO SYRP
5.0000 mL | ORAL_SOLUTION | ORAL | Status: DC | PRN
  Administered 2011-10-10 – 2011-10-11 (×2): 5 mL via ORAL
  Filled 2011-10-08 (×2): qty 5

## 2011-10-08 MED ORDER — ALBUTEROL SULFATE (5 MG/ML) 0.5% IN NEBU
5.0000 mg | INHALATION_SOLUTION | Freq: Once | RESPIRATORY_TRACT | Status: AC
  Administered 2011-10-08: 5 mg via RESPIRATORY_TRACT
  Filled 2011-10-08: qty 0.5

## 2011-10-08 MED ORDER — IOHEXOL 300 MG/ML  SOLN
100.0000 mL | Freq: Once | INTRAMUSCULAR | Status: AC | PRN
  Administered 2011-10-08: 100 mL via INTRAVENOUS

## 2011-10-08 MED ORDER — IPRATROPIUM BROMIDE 0.02 % IN SOLN
0.5000 mg | RESPIRATORY_TRACT | Status: AC
  Administered 2011-10-08: 0.5 mg via RESPIRATORY_TRACT
  Filled 2011-10-08: qty 2.5

## 2011-10-08 MED ORDER — HYDROMORPHONE HCL PF 1 MG/ML IJ SOLN: 1.0000 mg | INTRAMUSCULAR | Status: DC | PRN

## 2011-10-08 MED ORDER — ACETAMINOPHEN 650 MG RE SUPP: 650.0000 mg | Freq: Four times a day (QID) | RECTAL | Status: DC | PRN

## 2011-10-08 MED ORDER — METHYLPREDNISOLONE SODIUM SUCC 125 MG IJ SOLR
60.0000 mg | Freq: Four times a day (QID) | INTRAMUSCULAR | Status: DC
  Administered 2011-10-08 – 2011-10-10 (×6): 60 mg via INTRAVENOUS
  Filled 2011-10-08 (×12): qty 2

## 2011-10-08 MED ORDER — ASPIRIN 81 MG PO TABS
81.0000 mg | ORAL_TABLET | Freq: Every day | ORAL | Status: DC
  Administered 2011-10-09 – 2011-10-11 (×3): 81 mg via ORAL
  Filled 2011-10-08 (×7): qty 1

## 2011-10-08 MED ORDER — METHYLPREDNISOLONE SODIUM SUCC 125 MG IJ SOLR
125.0000 mg | Freq: Once | INTRAMUSCULAR | Status: AC
  Administered 2011-10-08: 125 mg via INTRAVENOUS
  Filled 2011-10-08: qty 2

## 2011-10-08 NOTE — ED Notes (Signed)
5509-01 Ready 

## 2011-10-08 NOTE — ED Notes (Signed)
5409-81 Ready

## 2011-10-08 NOTE — Progress Notes (Signed)
Quick Note:  LMTCB ______ 

## 2011-10-08 NOTE — Consult Note (Signed)
Name: [name] MRN: [id number] DOB: Sep 04, 1946    LOS: 0  PCCM CONSULTATION NOTE  History of Present Illness: 66 yo WF active smoker with COPD and history of mucus plugs and intubation presented to [facility] ED with worsening dyspnea x 1 week.  She reports wheezing, more frequent use of bronchodilators and inability to perform ADLs without significant shortness of breath.  Denies fever or change in amount, quantity or color of sputum.  No hemoptysis.  Lines / Drains: None  Cultures: 1/17  Blood  Antibiotics: 1/17  Avelox  Tests / Events: 1/17  CXR>>>chnages consistent with COPD, no infiltrates 1/17  Chest CTA>>>  Past Medical History  Diagnosis Date  . Chronic airway obstruction, not elsewhere classified   . Hypertension    Past Surgical History  Procedure Date  . Vaginal hysterectomy   . Oophorectomy    Prior to Admission medications   Medication Sig Start Date End Date Taking? Authorizing Provider  ALPRAZolam Prudy Feeler) 0.5 MG tablet TAKE ONE TABLET BY MOUTH THREE TIMES DAILY AS NEEDED FOR NERVOUSNESS 03/18/11  Yes Waymon Budge, MD  aspirin 81 MG tablet Take 81 mg by mouth daily.     Yes Historical Provider, MD  budesonide-formoterol (SYMBICORT) 160-4.5 MCG/ACT inhaler Inhale 2 puffs into the lungs 2 (two) times daily. Rinse mouth  12/19/10 12/19/11 Yes Waymon Budge, MD  COMBIVENT 18-103 MCG/ACT inhaler INHALE TWO PUFFS BY MOUTH 4 TIMES DAILY AS NEEDED 05/19/11  Yes Waymon Budge, MD  Ipratropium-Albuterol (COMBIVENT RESPIMAT) 20-100 MCG/ACT AERS Inhale 1 puff into the lungs 4 (four) times daily as needed. 10/01/11 09/30/12 Yes Clinton D Young, MD  ipratropium-albuterol (DUONEB) 0.5-2.5 (3) MG/3ML SOLN Take 3 mLs by nebulization every 4 (four) hours as needed. 10/01/11 09/30/12 Yes Clinton D Young, MD  lisinopril (PRINIVIL,ZESTRIL) 20 MG tablet Take 20 mg by mouth daily.     Yes Historical Provider, MD   Allergies No Known Allergies  Family History Family History    Problem Relation Age of Onset  . Liver cancer Mother   . Emphysema Father     Social History  reports that she has been smoking Cigarettes.  She has a 40 pack-year smoking history. She uses smokeless tobacco. She reports that she drinks alcohol. She reports that she does not use illicit drugs.  Review Of Systems  11 points review of systems is negative with an exception of listed in HPI.  Vital Signs: Temp:  [97.6 F (36.4 C)-98.2 F (36.8 C)] 97.6 F (36.4 C) (01/17 1824) Pulse Rate:  [84-105] 96  (01/17 1824) Resp:  [13-24] 17  (01/17 1824) BP: (113-165)/(50-106) 140/56 mmHg (01/17 1824) SpO2:  [2 %-100 %] 95 % (01/17 1824)    Physical Examination: General:  Anxious, no acute distress Neuro:  Awake, alert, cooperative   HEENT:  PERRL, moist membranes Neck:  No JVD   Cardiovascular:  RRR, no murmurs Lungs:  Bilateral air entry, very diminished air movement, no wheezing Abdomen:  Soft, nontender, nondistended Musculoskeletal:  No pedal edema Skin:  No rash  Ventilator settings:  None   Labs and Imaging:  Reviewed.  Please refer to the Assessment and Plan section for relevant results.  Assessment and Plan:  Acute exacerbation of COPD.  No evidence of pneumonia.  Doubt PE.  Elevated D-dimer is nonspecific, but CTA already ordered by ED.  No evidence of segmental / lobar collapse suggesting mucus plugging.  Lab 10/08/11 1536  WBC 8.6   -->  Solu-Medrol -->  Avelox -->  Bronchodilators -->  Blood Cx, Strep / legionella Ag, PCT, rapid flu -->  Follow CTA  Results -->  Repeat CXR in AM  History of hypertension, now normotensive -->  OK with Norvasc, though I do not think her cough is related to Lisinopril (ongoing smoking is most likely cause)  Tobacco abuse -->  Counseled -->  Nicotine patch  Best practices / Disposition -->  Floor status under [facility] -->  PCCM consulting -->  full code -->  Heparin for DVT Px -->  GI Px is not indicated -->  diet -->  Family  is not available  Orlean Bradford, M.D. Pulmonary and Critical Care Medicine [facility] Cell: [phone] Pager: [phone]  10/08/2011, 7:34 PM

## 2011-10-08 NOTE — ED Notes (Signed)
EKG completed and given to Dr. Yelverton along with OLD ekg. 

## 2011-10-08 NOTE — ED Notes (Addendum)
Sob x 7-10 days increasingly worse. Worse on exertion. Using inhaler more but not helping much. History COPD.pt states history of Mucous plugs.

## 2011-10-08 NOTE — ED Provider Notes (Signed)
History     CSN: [id number]  Arrival date & time 10/08/11  1430   First MD Initiated Contact with Patient 10/08/11 1504      Chief Complaint  Patient presents with  . Shortness of Breath    (Consider location/radiation/quality/duration/timing/severity/associated sxs/prior treatment) Patient is a 66 y.o. female presenting with shortness of breath. The history is provided by the patient.  Shortness of Breath  The current episode started more than 1 week ago. The onset was gradual. The problem occurs occasionally. The problem has been gradually worsening. The problem is moderate. The symptoms are aggravated by activity and a supine position. Associated symptoms include chest pressure, orthopnea, shortness of breath and wheezing. Pertinent negatives include no chest pain, no fever and no cough.    Past Medical History  Diagnosis Date  . Chronic airway obstruction, not elsewhere classified   . Hypertension     Past Surgical History  Procedure Date  . Vaginal hysterectomy   . Oophorectomy     Family History  Problem Relation Age of Onset  . Liver cancer Mother   . Emphysema Father     History  Substance Use Topics  . Smoking status: Current Everyday Smoker -- 1.0 packs/day for 40 years    Types: Cigarettes  . Smokeless tobacco: Current User  . Alcohol Use: Yes    OB History    Grav Para Term Preterm Abortions TAB SAB Ect Mult Living                  Review of Systems  Constitutional: Negative for fever and chills.  HENT: Negative for neck pain and neck stiffness.   Eyes: Negative for visual disturbance.  Respiratory: Positive for shortness of breath and wheezing. Negative for cough and chest tightness.   Cardiovascular: Positive for palpitations and orthopnea. Negative for chest pain and leg swelling.  Gastrointestinal: Negative for nausea, vomiting, abdominal pain and constipation.  Musculoskeletal: Negative for back pain, joint swelling and gait problem.  Skin:  Negative for color change, pallor and rash.  Neurological: Negative for dizziness, weakness and numbness.    Allergies  Review of patient's allergies indicates no known allergies.  Home Medications   Current Outpatient Rx  Name Route Sig Dispense Refill  . ALPRAZOLAM 0.5 MG PO TABS  TAKE ONE TABLET BY MOUTH THREE TIMES DAILY AS NEEDED FOR NERVOUSNESS 50 tablet 3  . ASPIRIN 81 MG PO TABS Oral Take 81 mg by mouth daily.      . BUDESONIDE-FORMOTEROL FUMARATE 160-4.5 MCG/ACT IN AERO Inhalation Inhale 2 puffs into the lungs 2 (two) times daily. Rinse mouth  1 Inhaler 12  . COMBIVENT 18-103 MCG/ACT IN AERO  INHALE TWO PUFFS BY MOUTH 4 TIMES DAILY AS NEEDED 15 g PRN  . IPRATROPIUM-ALBUTEROL 20-100 MCG/ACT IN AERS Inhalation Inhale 1 puff into the lungs 4 (four) times daily as needed. 1 Inhaler prn  . IPRATROPIUM-ALBUTEROL 0.5-2.5 (3) MG/3ML IN SOLN Nebulization Take 3 mLs by nebulization every 4 (four) hours as needed. 75 mL prn  . LISINOPRIL 20 MG PO TABS Oral Take 20 mg by mouth daily.        BP 165/96  Pulse 105  Temp(Src) 98.2 F (36.8 C) (Oral)  Resp 24  SpO2 94%  Physical Exam  Nursing note and vitals reviewed. Constitutional: She is oriented to person, place, and time. She appears well-developed and well-nourished. No distress.  HENT:  Head: Normocephalic and atraumatic.  Mouth/Throat: Oropharynx is clear and moist.  Eyes: EOM  are normal. Pupils are equal, round, and reactive to light.  Neck: Normal range of motion. Neck supple.  Cardiovascular: Normal rate and regular rhythm.   Pulmonary/Chest: Effort normal. No respiratory distress. She has wheezes. She has no rales.       End exp wheezing  Abdominal: Soft. Bowel sounds are normal.  Musculoskeletal: Normal range of motion. She exhibits no edema and no tenderness.  Neurological: She is alert and oriented to person, place, and time.  Skin: Skin is warm and dry. No rash noted. No erythema.  Psychiatric:       anxious     ED Course  Procedures (including critical care time)  Labs Reviewed  CARDIAC PANEL(CRET KIN+CKTOT+MB+TROPI) - Abnormal; Notable for the following:    CK, MB 4.7 (*)    All other components within normal limits  CBC  D-DIMER, QUANTITATIVE  COMPREHENSIVE METABOLIC PANEL  PRO B NATRIURETIC PEPTIDE   Dg Chest 2 View  10/08/2011  *RADIOLOGY REPORT*  Clinical Data: Shortness of breath  CHEST - 2 VIEW  Comparison: August 14, 2011  Findings: The lungs are hyperexpanded, consistent with an element of COPD.  There is chronic blunting of the right costophrenic angle.  The cardiac silhouette, mediastinum, and pulmonary vasculature are within normal limits.  No evidence of infiltrate or effusion.  IMPRESSION: COPD.  No acute findings.  Original Report Authenticated By: Brandon Melnick, M.D.     1. COPD exacerbation      Date: 10/08/2011  Rate: 82  Rhythm: normal sinus rhythm  QRS Axis: normal  Intervals: normal  ST/T Wave abnormalities: normal  Conduction Disutrbances:none  Narrative Interpretation:   Old EKG Reviewed: unchanged    MDM   Pt states she continues to be SOB after treatment. Will admit to [facility] for COPD exacerbation.         Loren Racer, MD 10/08/11 [phone]

## 2011-10-08 NOTE — ED Notes (Signed)
Attempt report to [place]. Nurse unable to take report.

## 2011-10-08 NOTE — ED Notes (Signed)
Patient undressed and in gown. Cardiac monitor, pulse ox, and blood pressure cuff on. 

## 2011-10-08 NOTE — ED Notes (Signed)
Attempt report. Nurse unable to take report.

## 2011-10-08 NOTE — H&P (Signed)
History and Physical       Hospital Admission Note Date: 10/08/2011  Patient name: [name] Medical record number: [id number] Date of birth: [birth year] Age: 66 y.o. Gender: female PCP: Elie Confer, MD, MD  Attending physician: Cathren Harsh, MD  Primary pulmonologist: Dr. Jetty Duhamel  Chief Complaint:  Shortness of breath worsening over last 1 week  HPI: Patient is a 67 year old female with history of COPD, continued nicotine abuse, hypertension presented to the ED with worsening shortness of breath over last 1 week. Patient states that she saw her pulmonologist Dr. Maple Hudson on 10/01/2011 and had just started wheezing at that time and continue to progressively worse in the last 1 week. She denied any fevers or chills, any nausea or vomiting chest pain. She says that the wheezing has been progressively worsening with exertion, she has been using her rescue inhaler in night in the last 1 week. Patient still continues to smoke tobacco, one pack per day. Patient is concerned about the recurrence of the mucous plugging and had intubations twice in the past.  Review of Systems:  Constitutional: Denies fever, chills, diaphoresis, appetite change and fatigue.  HEENT: Denies photophobia, eye pain, redness, hearing loss, ear pain, congestion, sore throat, rhinorrhea, sneezing, mouth sores, trouble swallowing, neck pain, neck stiffness and tinnitus.   Respiratory: See history of present illness  Cardiovascular: Denies chest pain, palpitations and leg swelling.  Gastrointestinal: Denies nausea, vomiting, abdominal pain, diarrhea, constipation, blood in stool and abdominal distention.  Genitourinary: Denies dysuria, urgency, frequency, hematuria, flank pain and difficulty urinating.  Musculoskeletal: Denies myalgias, back pain, joint swelling, arthralgias and gait problem.  Skin: Denies pallor, rash and wound.  Neurological: Denies  dizziness, seizures, syncope, weakness, light-headedness, numbness and headaches.  Hematological: Denies adenopathy. Easy bruising, personal or family bleeding history  Psychiatric/Behavioral: Denies suicidal ideation, mood changes, confusion, nervousness, sleep disturbance and agitation  Past Medical History: Past Medical History  Diagnosis Date  . Chronic airway obstruction, not elsewhere classified   . Hypertension    Past Surgical History  Procedure Date  . Vaginal hysterectomy   . Oophorectomy     Medications: Prior to Admission medications   Medication Sig Start Date End Date Taking? Authorizing Provider  ALPRAZolam Prudy Feeler) 0.5 MG tablet TAKE ONE TABLET BY MOUTH THREE TIMES DAILY AS NEEDED FOR NERVOUSNESS 03/18/11  Yes Waymon Budge, MD  aspirin 81 MG tablet Take 81 mg by mouth daily.     Yes Historical Provider, MD  budesonide-formoterol (SYMBICORT) 160-4.5 MCG/ACT inhaler Inhale 2 puffs into the lungs 2 (two) times daily. Rinse mouth  12/19/10 12/19/11 Yes Waymon Budge, MD  COMBIVENT 18-103 MCG/ACT inhaler INHALE TWO PUFFS BY MOUTH 4 TIMES DAILY AS NEEDED 05/19/11  Yes Waymon Budge, MD  Ipratropium-Albuterol (COMBIVENT RESPIMAT) 20-100 MCG/ACT AERS Inhale 1 puff into the lungs 4 (four) times daily as needed. 10/01/11 09/30/12 Yes Clinton D Young, MD  ipratropium-albuterol (DUONEB) 0.5-2.5 (3) MG/3ML SOLN Take 3 mLs by nebulization every 4 (four) hours as needed. 10/01/11 09/30/12 Yes Clinton D Young, MD  lisinopril (PRINIVIL,ZESTRIL) 20 MG tablet Take 20 mg by mouth daily.     Yes Historical Provider, MD    Allergies:  No Known Allergies  Social History:  reports that she has been smoking Cigarettes.  She has a 40 pack-year smoking history and still smoking one pack per day. She uses smokeless tobacco. She reports that she drinks alcohol. She reports that she does not use illicit drugs.  Family  History: Family History  Problem Relation Age of Onset  . Liver cancer Mother    . Emphysema Father     Physical Exam: Blood pressure 140/56, pulse 96, temperature 97.6 F (36.4 C), temperature source Oral, resp. rate 17, SpO2 95.00%. General: Alert, awake, oriented x3, in no acute distress. HEENT: anicteric sclera, pink conjunctiva, pupils equal and reactive to light and accomodation Neck: supple, no masses or lymphadenopathy, no goiter, no bruits  Heart: Regular rate and rhythm, without murmurs, rubs or gallops. Lungs: Bilateral wheezing with poor air entry Abdomen: Soft, nontender, nondistended, positive bowel sounds, no masses. Extremities: No clubbing, cyanosis or edema with positive pedal pulses. Neuro: Grossly intact, no focal neurological deficits, strength 5/5 upper and lower extremities bilaterally Psych: alert and oriented x 3, normal mood and affect Skin: no rashes or lesions, warm and dry   LABS on Admission:  Basic Metabolic Panel:  Lab 10/08/11 1610  NA 137  K 4.1  CL 99  CO2 28  GLUCOSE 99  BUN 6  CREATININE 0.55  CALCIUM 9.7  MG --  PHOS --   Liver Function Tests:  Lab 10/08/11 1536  AST 21  ALT 15  ALKPHOS 67  BILITOT 0.4  PROT 6.9  ALBUMIN 3.9   CBC:  Lab 10/08/11 1536  WBC 8.6  NEUTROABS --  HGB 14.7  HCT 42.9  MCV 89.0  PLT 263   Cardiac Enzymes:  Lab 10/08/11 1543  CKTOTAL 86  CKMB 4.7*  CKMBINDEX --  TROPONINI <0.30    Radiological Exams on Admission: Dg Chest 2 View  10/08/2011  *RADIOLOGY REPORT*  Clinical Data: Shortness of breath  CHEST - 2 VIEW  Comparison: August 14, 2011  Findings: The lungs are hyperexpanded, consistent with an element of COPD.  There is chronic blunting of the right costophrenic angle.  The cardiac silhouette, mediastinum, and pulmonary vasculature are within normal limits.  No evidence of infiltrate or effusion.  IMPRESSION: COPD.  No acute findings.  Original Report Authenticated By: Brandon Melnick, M.D.    Assessment/Plan Present on Admission:  . acute COPD exacerbation:    - Admit to telemetry floor, we'll place the patient on scheduled nebulizers with Xopenex and Atrovent, Symbicort, oxygen supplementation, and IV Avelox. - Patient was counseled strongly for nicotine cessation, monitor closely, pulse oximetry with history of intubations in the past  - Consulted pulmonology and discussed with Dr. Delford Field, keep low threshold of transferring to step down/ICU if develops any hypoxic respiratory failure.    Marland Kitchen HYPERTENSION: Patient is on lisinopril, however with her COPD and coughing, I will hold lisinopril and place on Norvasc.   . Nicotine abuse: Patient was counseled strongly for nicotine cessation, will place on nicotine patch   . Elevated d-dimer: Obtain CT angiogram of the chest to rule out any pulmonary embolism   DVT prophylaxis: Lovenox   CODE STATUS: Full Code   Further plan will depend as patient's clinical course evolves and further radiologic and laboratory data become available.   @Time  Spent on Admission: 1 hour Rishan Oyama M.D. [facility] 10/08/2011, 6:28 PM

## 2011-10-09 ENCOUNTER — Other Ambulatory Visit: Payer: Self-pay

## 2011-10-09 LAB — BASIC METABOLIC PANEL
CO2: 25 mEq/L (ref 19–32)
Calcium: 9.4 mg/dL (ref 8.4–10.5)
GFR calc non Af Amer: 89 mL/min — ABNORMAL LOW (ref >90)
Glucose, Bld: 146 mg/dL — ABNORMAL HIGH (ref 70–99)
Potassium: 4 mEq/L (ref 3.5–5.1)
Sodium: 136 mEq/L (ref 135–145)

## 2011-10-09 LAB — CBC
Hemoglobin: 13.5 g/dL (ref 12.0–15.0)
MCH: 30.3 pg (ref 26.0–34.0)
Platelets: 254 10*3/uL (ref 150–400)
RBC: 4.45 MIL/uL (ref 3.87–5.11)
WBC: 6.3 10*3/uL (ref 4.0–10.5)

## 2011-10-09 MED ORDER — PANTOPRAZOLE SODIUM 40 MG PO TBEC
40.0000 mg | DELAYED_RELEASE_TABLET | Freq: Two times a day (BID) | ORAL | Status: DC
  Administered 2011-10-09 – 2011-10-12 (×6): 40 mg via ORAL
  Filled 2011-10-09 (×6): qty 1

## 2011-10-09 NOTE — Progress Notes (Signed)
[name]  NWG:[id number]  DOB: 16-May-1946  DOA: 10/08/2011  PCP: Elie Confer, MD, MD  Subjective: Shortness of breath improving from yesterday  Objective: Weight change:   Intake/Output Summary (Last 24 hours) at 10/09/11 1155 Last data filed at 10/09/11 0900  Gross per 24 hour  Intake    358 ml  Output      0 ml  Net    358 ml   Blood pressure 101/68, pulse 91, temperature 97.6 F (36.4 C), temperature source Oral, resp. rate 20, SpO2 96.00%.  Physical Exam: General: Alert and awake, oriented x3, not in any acute distress. HEENT: anicteric sclera, pupils reactive to light and accommodation, EOMI CVS: S1-S2 clear, no murmur rubs or gallops Chest: Diminished air movement, no wheezing Abdomen: soft nontender, nondistended, normal bowel sounds, no organomegaly Extremities: no cyanosis, clubbing or edema noted bilaterally Neuro: Cranial nerves II-XII intact, no focal neurological deficits  Lab Results: Basic Metabolic Panel:  Lab 10/09/11 5784 10/08/11 2010 10/08/11 1536  NA 136 -- 137  K 4.0 -- 4.1  CL 100 -- 99  CO2 25 -- 28  GLUCOSE 146* -- 99  BUN 12 -- 6  CREATININE 0.68 0.59 --  CALCIUM 9.4 -- 9.7  MG -- -- --  PHOS -- -- --   Liver Function Tests:  Lab 10/08/11 1536  AST 21  ALT 15  ALKPHOS 67  BILITOT 0.4  PROT 6.9  ALBUMIN 3.9   CBC:  Lab 10/09/11 0525 10/08/11 2010  WBC 6.3 9.0  NEUTROABS -- --  HGB 13.5 13.9  HCT 39.3 40.5  MCV 88.3 89.2  PLT 254 253   Cardiac Enzymes:  Lab 10/08/11 1543  CKTOTAL 86  CKMB 4.7*  CKMBINDEX --  TROPONINI <0.30     Micro Results: Recent Results (from the past 240 hour(s))  RAPID STREP SCREEN     Status: Normal   Collection Time   10/08/11 11:20 PM      Component Value Range Status Comment   Streptococcus, Group A Screen (Direct) NEGATIVE  NEGATIVE  Final     Studies/Results: Dg Chest 2 View  10/08/2011  *RADIOLOGY REPORT*  Clinical Data: Shortness of breath  CHEST - 2 VIEW   Comparison: August 14, 2011  Findings: The lungs are hyperexpanded, consistent with an element of COPD.  There is chronic blunting of the right costophrenic angle.  The cardiac silhouette, mediastinum, and pulmonary vasculature are within normal limits.  No evidence of infiltrate or effusion.  IMPRESSION: COPD.  No acute findings.  Original Report Authenticated By: Brandon Melnick, M.D.   Ct Angio Chest W/cm &/or Wo Cm  10/08/2011  *RADIOLOGY REPORT*  Clinical Data: Shortness of breath.  Elevated D-dimer.  CT ANGIOGRAPHY CHEST  Technique:  Multidetector CT imaging of the chest using the standard protocol during bolus administration of intravenous contrast. Multiplanar reconstructed images including MIPs were obtained and reviewed to evaluate the vascular anatomy.  Contrast: OMNIPAQUE IOHEXOL 300 MG/ML IV SOLN  Comparison: Unenhanced CT of the chest dated 08/04/2009  Findings: The pulmonary arteries are adequately opacified.  There is no evidence of pulmonary embolism.  Lungs show stable emphysematous disease.  No evidence of focal infiltrate, mass, edema or pleural effusion.  The heart size is normal.  No pericardial fluid.  No enlarged lymph nodes.  The thoracic aorta is of normal caliber.  No bony abnormalities.  IMPRESSION: No evidence of pulmonary embolism or other acute findings.  Stable emphysematous lung disease.  Original Report  Authenticated By: Reola Calkins, M.D.    Medications: Scheduled Meds:   . albuterol  5 mg Nebulization Once  . albuterol  5 mg Nebulization Once  . ALPRAZolam  0.5 mg Oral BID  . amLODipine  5 mg Oral Daily  . aspirin  81 mg Oral Daily  . budesonide-formoterol  2 puff Inhalation BID  . enoxaparin  40 mg Subcutaneous Q24H  . ipratropium  0.5 mg Nebulization STAT  . ipratropium  0.5 mg Nebulization QID  . levalbuterol  0.63 mg Nebulization QID  . methylPREDNISolone (SOLU-MEDROL) injection  125 mg Intravenous Once  . methylPREDNISolone (SOLU-MEDROL)  injection  60 mg Intravenous Q6H  . moxifloxacin  400 mg Intravenous Q24H  . nicotine  14 mg Transdermal Daily  . pantoprazole  40 mg Oral BID AC   Continuous Infusions:    Assessment/Plan:  acute COPD exacerbation:  - Continue scheduled nebulizers with Xopenex and Atrovent, Symbicort, oxygen supplementation, and IV Avelox.  -  monitor closely, pulse oximetry with history of intubations in the past  - Pulmonology recommendations appreciated, keep low threshold of transferring to step down/ICU if develops any hypoxic respiratory failure.   Marland Kitchen HYPERTENSION: Continue Norvasc  . Nicotine abuse: Patient was counseled strongly for nicotine cessation, continue nicotine patch   . Elevated d-dimer:  CT angiogram of the chest negative for PE   DVT prophylaxis: Lovenox   CODE STATUS: Full Code   Disposition:  Not medically ready   LOS: 1 day   Kasiah Manka M.D. [facility] 10/09/2011, 11:55 AM

## 2011-10-09 NOTE — Progress Notes (Signed)
Pt's flu PCR was negative, so droplet precautions were discontinued.  Note: there was not an order for pt to be on droplet precautions.

## 2011-10-09 NOTE — Progress Notes (Signed)
Name: [name] MRN: [id number] DOB: 05/19/1946    LOS: 1  PCCM Progress  NOTE  Brief patient profile:  66 yo WF active smoker with COPD on ACEI  and history of mucus plugs and intubation presented to [facility] ED 1/17 with worsening dyspnea x 1 week.     Lines / Drains: None  Cultures: 1/17 Rapid strep > neg  Antibiotics: 1/17  Avelox (AECOPD) >>>  Tests / Events: 1/17  CXR>>>chnages consistent with COPD, no infiltrates 1/17  Chest CTA> No evidence of pulmonary embolism or other acute findings. Stable  emphysematous lung disease.  Overnight Very anxious that she has another mucus plug but nad   Vital Signs: Temp:  [97.4 F (36.3 C)-98.2 F (36.8 C)] 97.6 F (36.4 C) (01/18 0639) Pulse Rate:  [84-113] 91  (01/18 0639) Resp:  [13-24] 20  (01/18 0639) BP: (101-165)/(46-106) 101/68 mmHg (01/18 0639) SpO2:  [2 %-100 %] 96 % (01/18 0756)    Physical Examination: General:  Anxious, no acute distress Neuro:  Awake, alert, cooperative   HEENT:  PERRL, moist membranes Neck:  No JVD   Cardiovascular:  RRR, no murmurs Lungs:  Bilateral air entry, very diminished air movement, no wheezing Abdomen:  Soft, nontender, nondistended Musculoskeletal:  No pedal edema Skin:  No rash      Labs   Lab 10/09/11 0525 10/08/11 2010 10/08/11 1536  NA 136 -- 137  K 4.0 -- 4.1  CL 100 -- 99  CO2 25 -- 28  BUN 12 -- 6  CREATININE 0.68 0.59 0.55  GLUCOSE 146* -- 99    Lab 10/09/11 0525 10/08/11 2010 10/08/11 1536  HGB 13.5 13.9 14.7  HCT 39.3 40.5 42.9  WBC 6.3 9.0 8.6  PLT 254 253 263      Assessment and Plan:  Acute exacerbation of COPD.  No evidence of pneumonia or PE or mucus plugs Occurred on ACEI (see HBP) -->  Solu-Medrol -->  Avelox -->  Bronchodilators -->  Blood Cx, Strep / legionella Ag, PCT, rapid flu > NO MORE ACEI  History of hypertension, now normotensive -->  OK with Norvasc, though I do not think her cough is related to Lisinopril - ACE inhibitors  are problematic in  pts with airway complaints because  even experienced pulmonologists can't always distinguish ace effects from copd/asthma.  By themselves they don't actually cause a problem, much like oxygen can't by itself start a fire, but they certainly serve as a powerful catalyst or enhancer for any "fire"  or inflammatory process in the upper airway, be it caused by an ET  tube or more commonly reflux (especially in the obese or pts with known GERD or who are on biphoshonates).    In the era of ARB near equivalency until we have a better handle on the reversibility of the airway problem, it just makes sense to avoid ACEI  entirely in the short run and then decide later, having established a level of airway control using a reasonable limited regimen, whether to add back ace but even then being very careful to observe the pt for worsening airway control and number of meds used/ needed to control symptoms.   Given the instability of her symptoms chronically however I would avoid ACEI indefinitely  Tobacco abuse -->  Counseled -->  Nicotine patch  I emphasized that although we never turn away smokers from the pulmonary clinic, we do ask that they understand that the recommendations that we make  won't work  nearly as well in the presence of continued cigarette exposure.  In fact, we may very well  reach a point where we can't promise to help the patient if he/she can't quit smoking. (We can and will promise to try to help, we just can't promise what we recommend will really work)   [employer] / Disposition -->  Floor status under [facility] -->  PCCM consulting -->  full code -->  Heparin for DVT Px -->  GI Px  Is indicated on steroids with refractory atypical symptoms -->  diet -->  Family is not available    Sandrea Hughs, MD Pulmonary and Critical Care Medicine [facility] Cell [phone]

## 2011-10-09 NOTE — Progress Notes (Signed)
Utilization Review Completed.Andy Moye T11/04/2012   

## 2011-10-09 NOTE — Progress Notes (Signed)
Late Entry: 10/08/2011 21:00.  Pt admitted to the unit. Pt is alert and oriented. Pt oriented to room, staff, and call bell. Bed in lowest position. Full assessment to Epic. Call bell with in reach. Told to call for assists. Will continue to monitor.  [name] Edmon Crape Yaasir Menken

## 2011-10-10 MED ORDER — METHYLPREDNISOLONE SODIUM SUCC 40 MG IJ SOLR
40.0000 mg | Freq: Three times a day (TID) | INTRAMUSCULAR | Status: DC
  Administered 2011-10-10 – 2011-10-11 (×3): 40 mg via INTRAVENOUS
  Filled 2011-10-10 (×6): qty 1

## 2011-10-10 MED ORDER — ASPIRIN 81 MG PO CHEW
CHEWABLE_TABLET | ORAL | Status: AC
  Filled 2011-10-10: qty 1

## 2011-10-10 NOTE — Plan of Care (Signed)
Problem: ICU Phase Progression Outcomes Goal: Initial discharge plan identified Outcome: Completed/Met Date Met:  10/10/11 Pt to return home when medically cleared  Problem: Phase II Progression Outcomes Goal: Dyspnea controlled w/progressive activity Outcome: Progressing Pt continues to have SOB with activity

## 2011-10-10 NOTE — Progress Notes (Signed)
[name]  ZOX:[id number]  DOB: Jul 12, 1946  DOA: 10/08/2011  PCP: Elie Confer, MD, MD  Subjective: Shortness of breath and wheezing improving   Objective: Weight change:   Intake/Output Summary (Last 24 hours) at 10/10/11 0927 Last data filed at 10/10/11 0919  Gross per 24 hour  Intake    840 ml  Output      0 ml  Net    840 ml   Blood pressure 101/64, pulse 73, temperature 98 F (36.7 C), temperature source Oral, resp. rate 19, SpO2 98.00%.  Physical Exam: General: Alert and awake, oriented x3, not in any acute distress. HEENT: anicteric sclera, pupils reactive to light and accommodation, EOMI CVS: S1-S2 clear, no murmur rubs or gallops Chest: b/l wheezing improving Abdomen: soft nontender, nondistended, normal bowel sounds, no organomegaly Extremities: no cyanosis, clubbing or edema noted bilaterally Neuro: Cranial nerves II-XII intact, no focal neurological deficits  Lab Results: Basic Metabolic Panel:  Lab 10/09/11 8119 10/08/11 2010 10/08/11 1536  NA 136 -- 137  K 4.0 -- 4.1  CL 100 -- 99  CO2 25 -- 28  GLUCOSE 146* -- 99  BUN 12 -- 6  CREATININE 0.68 0.59 --  CALCIUM 9.4 -- 9.7  MG -- -- --  PHOS -- -- --   Liver Function Tests:  Lab 10/08/11 1536  AST 21  ALT 15  ALKPHOS 67  BILITOT 0.4  PROT 6.9  ALBUMIN 3.9   CBC:  Lab 10/09/11 0525 10/08/11 2010  WBC 6.3 9.0  NEUTROABS -- --  HGB 13.5 13.9  HCT 39.3 40.5  MCV 88.3 89.2  PLT 254 253   Cardiac Enzymes:  Lab 10/08/11 1543  CKTOTAL 86  CKMB 4.7*  CKMBINDEX --  TROPONINI <0.30     Micro Results: Recent Results (from the past 240 hour(s))  RAPID STREP SCREEN     Status: Normal   Collection Time   10/08/11 11:20 PM      Component Value Range Status Comment   Streptococcus, Group A Screen (Direct) NEGATIVE  NEGATIVE  Final     Studies/Results: Dg Chest 2 View  10/08/2011  *RADIOLOGY REPORT*  Clinical Data: Shortness of breath  CHEST - 2 VIEW  Comparison: August 14, 2011  Findings: The lungs are hyperexpanded, consistent with an element of COPD.  There is chronic blunting of the right costophrenic angle.  The cardiac silhouette, mediastinum, and pulmonary vasculature are within normal limits.  No evidence of infiltrate or effusion.  IMPRESSION: COPD.  No acute findings.  Original Report Authenticated By: Brandon Melnick, M.D.   Ct Angio Chest W/cm &/or Wo Cm  10/08/2011  *RADIOLOGY REPORT*  Clinical Data: Shortness of breath.  Elevated D-dimer.  CT ANGIOGRAPHY CHEST  Technique:  Multidetector CT imaging of the chest using the standard protocol during bolus administration of intravenous contrast. Multiplanar reconstructed images including MIPs were obtained and reviewed to evaluate the vascular anatomy.  Contrast: OMNIPAQUE IOHEXOL 300 MG/ML IV SOLN  Comparison: Unenhanced CT of the chest dated 08/04/2009  Findings: The pulmonary arteries are adequately opacified.  There is no evidence of pulmonary embolism.  Lungs show stable emphysematous disease.  No evidence of focal infiltrate, mass, edema or pleural effusion.  The heart size is normal.  No pericardial fluid.  No enlarged lymph nodes.  The thoracic aorta is of normal caliber.  No bony abnormalities.  IMPRESSION: No evidence of pulmonary embolism or other acute findings.  Stable emphysematous lung disease.  Original Report Authenticated  By: Reola Calkins, M.D.    Medications: Scheduled Meds:    . ALPRAZolam  0.5 mg Oral BID  . amLODipine  5 mg Oral Daily  . aspirin      . aspirin  81 mg Oral Daily  . budesonide-formoterol  2 puff Inhalation BID  . enoxaparin  40 mg Subcutaneous Q24H  . ipratropium  0.5 mg Nebulization QID  . levalbuterol  0.63 mg Nebulization QID  . methylPREDNISolone (SOLU-MEDROL) injection  40 mg Intravenous Q8H  . moxifloxacin  400 mg Intravenous Q24H  . nicotine  14 mg Transdermal Daily  . pantoprazole  40 mg Oral BID AC  . DISCONTD: methylPREDNISolone (SOLU-MEDROL)  injection  60 mg Intravenous Q6H   Continuous Infusions:    Assessment/Plan:  acute COPD exacerbation: Improving - Continue scheduled nebulizers with Xopenex and Atrovent, Symbicort, oxygen supplementation, and IV Avelox.  - Taper IV Solu-Medrol, transitioned to by mouth prednisone tomorrow - monitor closely, pulse oximetry with history of intubations in the past  - Pulmonology recommendations appreciated   . HYPERTENSION: Continue Norvasc  . Nicotine abuse: Patient was counseled strongly for nicotine cessation, continue nicotine patch   . Elevated d-dimer:  CT angiogram of the chest negative for PE   DVT prophylaxis: Lovenox   CODE STATUS: Full Code   Disposition: Likely DC in 24-48 hours   LOS: 2 days   Toia Micale M.D. [facility] 10/10/2011, 9:27 AM

## 2011-10-11 MED ORDER — PREDNISONE 50 MG PO TABS
60.0000 mg | ORAL_TABLET | Freq: Every day | ORAL | Status: DC
  Administered 2011-10-11 – 2011-10-12 (×2): 60 mg via ORAL
  Filled 2011-10-11 (×3): qty 1

## 2011-10-11 NOTE — Progress Notes (Signed)
[name] O2 sat 95% at rest. Pt ambulated in hall on room air patient's O2 sat dropped to 84%. O2  2L [place] reapplied pts 02 sat up to 90%,  pt SOB and ambulated back to room,  pt O2 sat returned to 94% on 2L at rest. [name] [facility] 10/11/2011  5:11 PM

## 2011-10-11 NOTE — Progress Notes (Signed)
[name]  ZOX:[id number]  DOB: Mar 09, 1946  DOA: 10/08/2011  PCP: Elie Confer, MD, MD  Subjective: Shortness of breath and wheezing improving, still somewhat short of breath on exertion. No chest pain   Objective: Weight change:   Intake/Output Summary (Last 24 hours) at 10/11/11 1119 Last data filed at 10/11/11 1043  Gross per 24 hour  Intake    960 ml  Output   2250 ml  Net  -1290 ml   Blood pressure 114/60, pulse 86, temperature 97.7 F (36.5 C), temperature source Oral, resp. rate 20, height 5\' 2"  (1.575 m), weight 56.5 kg (124 lb 9 oz), SpO2 96.00%.  Physical Exam: General: Alert and awake, oriented x3, not in any acute distress. HEENT: anicteric sclera, pupils reactive to light and accommodation, EOMI CVS: S1-S2 clear, no murmur rubs or gallops Chest: b/l wheezing improving Abdomen: soft nontender, nondistended, normal bowel sounds, no organomegaly Extremities: no cyanosis, clubbing or edema noted bilaterally Neuro: Cranial nerves II-XII intact, no focal neurological deficits  Lab Results: Basic Metabolic Panel:  Lab 10/09/11 8119 10/08/11 2010 10/08/11 1536  NA 136 -- 137  K 4.0 -- 4.1  CL 100 -- 99  CO2 25 -- 28  GLUCOSE 146* -- 99  BUN 12 -- 6  CREATININE 0.68 0.59 --  CALCIUM 9.4 -- 9.7  MG -- -- --  PHOS -- -- --   Liver Function Tests:  Lab 10/08/11 1536  AST 21  ALT 15  ALKPHOS 67  BILITOT 0.4  PROT 6.9  ALBUMIN 3.9   CBC:  Lab 10/09/11 0525 10/08/11 2010  WBC 6.3 9.0  NEUTROABS -- --  HGB 13.5 13.9  HCT 39.3 40.5  MCV 88.3 89.2  PLT 254 253   Cardiac Enzymes:  Lab 10/08/11 1543  CKTOTAL 86  CKMB 4.7*  CKMBINDEX --  TROPONINI <0.30     Micro Results: Recent Results (from the past 240 hour(s))  RAPID STREP SCREEN     Status: Normal   Collection Time   10/08/11 11:20 PM      Component Value Range Status Comment   Streptococcus, Group A Screen (Direct) NEGATIVE  NEGATIVE  Final     Studies/Results: Dg Chest 2  View  10/08/2011  *RADIOLOGY REPORT*  Clinical Data: Shortness of breath  CHEST - 2 VIEW  Comparison: August 14, 2011  Findings: The lungs are hyperexpanded, consistent with an element of COPD.  There is chronic blunting of the right costophrenic angle.  The cardiac silhouette, mediastinum, and pulmonary vasculature are within normal limits.  No evidence of infiltrate or effusion.  IMPRESSION: COPD.  No acute findings.  Original Report Authenticated By: Brandon Melnick, M.D.   Ct Angio Chest W/cm &/or Wo Cm  10/08/2011  *RADIOLOGY REPORT*  Clinical Data: Shortness of breath.  Elevated D-dimer.  CT ANGIOGRAPHY CHEST  Technique:  Multidetector CT imaging of the chest using the standard protocol during bolus administration of intravenous contrast. Multiplanar reconstructed images including MIPs were obtained and reviewed to evaluate the vascular anatomy.  Contrast: OMNIPAQUE IOHEXOL 300 MG/ML IV SOLN  Comparison: Unenhanced CT of the chest dated 08/04/2009  Findings: The pulmonary arteries are adequately opacified.  There is no evidence of pulmonary embolism.  Lungs show stable emphysematous disease.  No evidence of focal infiltrate, mass, edema or pleural effusion.  The heart size is normal.  No pericardial fluid.  No enlarged lymph nodes.  The thoracic aorta is of normal caliber.  No bony abnormalities.  IMPRESSION: No  evidence of pulmonary embolism or other acute findings.  Stable emphysematous lung disease.  Original Report Authenticated By: Reola Calkins, M.D.    Medications: Scheduled Meds:    . ALPRAZolam  0.5 mg Oral BID  . amLODipine  5 mg Oral Daily  . aspirin  81 mg Oral Daily  . budesonide-formoterol  2 puff Inhalation BID  . enoxaparin  40 mg Subcutaneous Q24H  . ipratropium  0.5 mg Nebulization QID  . levalbuterol  0.63 mg Nebulization QID  . moxifloxacin  400 mg Intravenous Q24H  . nicotine  14 mg Transdermal Daily  . pantoprazole  40 mg Oral BID AC  . predniSONE  60 mg  Oral Q breakfast  . DISCONTD: methylPREDNISolone (SOLU-MEDROL) injection  40 mg Intravenous Q8H   Continuous Infusions:    Assessment/Plan:  acute COPD exacerbation: Improving - Continue scheduled nebulizers with Xopenex and Atrovent, Symbicort, oxygen supplementation, and IV Avelox. Pulmonology signed off. No pulmonary embolism noted on the CT angiogram chest. 2-D echocardiogram done EF 55-65%, normal wall motion. - DC'd IV Solu-Medrol, started on by mouth prednisone. - Pulmonology recommendations appreciated - Will check home O2 evaluation at rest, exertion and sleep.    Marland Kitchen HYPERTENSION: Continue Norvasc  . Nicotine abuse: Patient was counseled strongly for nicotine cessation, continue nicotine patch   . Elevated d-dimer:  CT angiogram of the chest negative for PE   DVT prophylaxis: Lovenox   CODE STATUS: Full Code   Disposition: Likely DC tomorrow    LOS: 3 days   Sharronda Schweers M.D. [facility] 10/11/2011, 11:19 AM

## 2011-10-12 MED ORDER — IPRATROPIUM-ALBUTEROL 0.5-2.5 (3) MG/3ML IN SOLN: 3.0000 mL | Freq: Three times a day (TID) | RESPIRATORY_TRACT | Status: DC

## 2011-10-12 MED ORDER — NICOTINE 14 MG/24HR TD PT24: 1.0000 | MEDICATED_PATCH | Freq: Every day | TRANSDERMAL | Status: AC

## 2011-10-12 MED ORDER — LEVALBUTEROL HCL 0.63 MG/3ML IN NEBU
0.6300 mg | INHALATION_SOLUTION | Freq: Three times a day (TID) | RESPIRATORY_TRACT | Status: DC
  Administered 2011-10-12: 0.63 mg via RESPIRATORY_TRACT
  Filled 2011-10-12 (×3): qty 3

## 2011-10-12 MED ORDER — MOXIFLOXACIN HCL 400 MG PO TABS: 400.0000 mg | ORAL_TABLET | Freq: Every day | ORAL | Status: AC

## 2011-10-12 MED ORDER — AMLODIPINE BESYLATE 10 MG PO TABS: 10.0000 mg | ORAL_TABLET | Freq: Every day | ORAL | Status: DC

## 2011-10-12 MED ORDER — PREDNISONE (PAK) 10 MG PO TABS: 10.0000 mg | ORAL_TABLET | Freq: Every day | ORAL | Status: AC

## 2011-10-12 MED ORDER — IPRATROPIUM BROMIDE 0.02 % IN SOLN
0.5000 mg | Freq: Three times a day (TID) | RESPIRATORY_TRACT | Status: DC
  Administered 2011-10-12: 0.5 mg via RESPIRATORY_TRACT
  Filled 2011-10-12: qty 2.5

## 2011-10-12 MED ORDER — ASPIRIN EC 81 MG PO TBEC
81.0000 mg | DELAYED_RELEASE_TABLET | Freq: Every day | ORAL | Status: DC
  Administered 2011-10-12: 81 mg via ORAL
  Filled 2011-10-12: qty 1

## 2011-10-12 MED ORDER — PANTOPRAZOLE SODIUM 40 MG PO TBEC: 40.0000 mg | DELAYED_RELEASE_TABLET | Freq: Two times a day (BID) | ORAL | Status: DC

## 2011-10-12 MED ORDER — ALPRAZOLAM 0.5 MG PO TABS: 0.5000 mg | ORAL_TABLET | Freq: Three times a day (TID) | ORAL | Status: DC | PRN

## 2011-10-12 NOTE — Progress Notes (Signed)
10/12/11 1430  D/C instructions and prescriptions explained and given to pt.;pt. seem upset that she's being d/c'd-has questions about why on norvasc rather than lisinopril and on protonix. Paged Dr. Isidoro Donning and explanations for meds given and explained to pt. Told pt. If she or daughter have any other questions please ask. Resp. therapy with pt. and answering questions about inhalers.  Leandrew Koyanagi Manuela Halbur,RN

## 2011-10-12 NOTE — Progress Notes (Addendum)
[name] O2 sat 94% at rest. Pt ambulated in hall on room air patient's O2 sat dropped to 85%. O2 2L [place] reapplied pts 02 sat up to 92%, pt SOB and ambulated back to room, pt O2 sat returned to 94% on 2L at rest. [name] [facility]

## 2011-10-12 NOTE — Progress Notes (Signed)
Name: [name] MRN: [id number] DOB: 12-06-1945    LOS: 4  PCCM Progress  NOTE  Brief patient profile:  66 yo WF active smoker with COPD on ACEI  and history of mucus plugs and intubation presented to [facility] ED 1/17 with worsening dyspnea x 1 week.     Lines / Drains: None  Cultures: 1/17 Rapid strep > neg  Antibiotics: 1/17  Avelox (AECOPD) >>>  Tests / Events: 1/17  CXR>>>chnages consistent with COPD, no infiltrates 1/17  Chest CTA> No evidence of pulmonary embolism or other acute findings. Stable  emphysematous lung disease.  Overnight Anxious.  Feels better but continues to feel tight.    Vital Signs: Temp:  [97.3 F (36.3 C)-97.6 F (36.4 C)] 97.3 F (36.3 C) (01/21 0535) Pulse Rate:  [73-101] 73  (01/21 0535) Resp:  [20-21] 21  (01/21 0535) BP: (130-154)/(73-79) 130/79 mmHg (01/21 0535) SpO2:  [92 %-98 %] 94 % (01/21 0749) I/O last 3 completed shifts: In: 2000 [P.O.:1800; IV Piggyback:200] Out: 2750 [Urine:2750]  Physical Examination: General:  Anxious, no acute distress Neuro:  Awake, alert, cooperative   HEENT:  PERRL, moist membranes Neck:  No JVD   Cardiovascular:  RRR, no murmurs Lungs:  Bilateral air entry, improved air entry, no wheezing Abdomen:  Soft, nontender, nondistended Musculoskeletal:  No pedal edema Skin:  No rash      Labs   Lab 10/09/11 0525 10/08/11 2010 10/08/11 1536  NA 136 -- 137  K 4.0 -- 4.1  CL 100 -- 99  CO2 25 -- 28  BUN 12 -- 6  CREATININE 0.68 0.59 0.55  GLUCOSE 146* -- 99    Lab 10/09/11 0525 10/08/11 2010 10/08/11 1536  HGB 13.5 13.9 14.7  HCT 39.3 40.5 42.9  WBC 6.3 9.0 8.6  PLT 254 253 263      Assessment and Plan:  Acute exacerbation of COPD.  No evidence of pneumonia or PE or mucus plugs Occurred on ACEI (see HBP) -->  Solu-Medrol-->slow pred taper -->  Avelox -->  Bronchodilators -->  Blood Cx, Strep / legionella Ag, PCT, rapid flu all negative -->  NO MORE ACEI  History of hypertension, now  normotensive -->  Per [facility]    Tobacco abuse -->  Counseled, encouraged smoking cessation.  -->  Nicotine patch  [name] / Disposition -->  Floor status under [facility] -->  PCCM consulting -->  full code -->  Heparin for DVT Px -->  GI Px  Is indicated on steroids with refractory atypical symptoms -->  diet  Likely can be d/c home per [facility].  Symptoms improved.  Follow up scheduled in office with Dr. Maple Hudson.   Canary Brim, NP-C [facility] Pgr: [phone]   Attending Addendum:  I have seen the patient, discussed the issues, test results and plans with B. Veleta Miners, NP. I agree with the Assessment and Plans as outlined above.   Kaitlyn Pupa, MD, PhD 10/12/2011, 12:18 PM [place] [phone] or if no answer [phone]

## 2011-10-12 NOTE — Progress Notes (Signed)
During night shift 7p-7a starting the 20th of January the patient at rest would sat 97-98% on 2L.  During ambulation, the patient's sats dropped down to the range of 89-91%. Traci Gafford, Joan Mayans, RN

## 2011-10-12 NOTE — Progress Notes (Signed)
[name] discharged Home per MD order.  CN reviewed and discussed with the patient , all questions and concerns answered. Copy of instructions and scripts given to patient.   [name], [name]  Home Medication Instructions ZOX:[id number]   Printed on:10/12/11 1654  Medication Information                    aspirin 81 MG tablet Take 81 mg by mouth daily.             budesonide-formoterol (SYMBICORT) 160-4.5 MCG/ACT inhaler Inhale 2 puffs into the lungs 2 (two) times daily. Rinse mouth            COMBIVENT 18-103 MCG/ACT inhaler INHALE TWO PUFFS BY MOUTH 4 TIMES DAILY AS NEEDED           Ipratropium-Albuterol (COMBIVENT RESPIMAT) 20-100 MCG/ACT AERS Inhale 1 puff into the lungs 4 (four) times daily as needed.           ALPRAZolam (XANAX) 0.5 MG tablet Take 1 tablet (0.5 mg total) by mouth 3 (three) times daily as needed for sleep or anxiety.           amLODipine (NORVASC) 10 MG tablet Take 1 tablet (10 mg total) by mouth daily.           ipratropium-albuterol (DUONEB) 0.5-2.5 (3) MG/3ML SOLN Take 3 mLs by nebulization 3 (three) times daily.           nicotine (NICODERM CQ - DOSED IN MG/24 HOURS) 14 mg/24hr patch Place 1 patch onto the skin daily.           pantoprazole (PROTONIX) 40 MG tablet Take 1 tablet (40 mg total) by mouth 2 (two) times daily before a meal.           moxifloxacin (AVELOX) 400 MG tablet Take 1 tablet (400 mg total) by mouth daily.           predniSONE (STERAPRED UNI-PAK) 10 MG tablet Take 1 tablet (10 mg total) by mouth daily. Prednisone dosing: Take  Prednisone 40mg  (4 tabs) x 3 days, then taper to 30mg  (3 tabs) x 3 days, then 20mg  (2 tabs) x 3days, then 10mg  (1 tab) x 3days, then OFF.  Dispense:  30 tabs, refills: None             Patients skin is clean, dry and intact, no evidence of skin break down. IV site discontinued and catheter remains intact. Site without signs and symptoms of complications. Dressing and pressure applied.  Patient  escorted to car by NT in a wheelchair,  no distress noted upon discharge.  Julien Nordmann [facility] 10/12/2011 4:54 PM

## 2011-10-12 NOTE — Progress Notes (Signed)
Pt has neb machine through [place].  Home O2 ordered and documents faxed to [facility] as requested.

## 2011-10-12 NOTE — Progress Notes (Signed)
O2 saturations reviewed with ambulation.  Will need home O2 at 2L per [place] continuous for discharge.  TRH working on disposition.

## 2011-10-12 NOTE — Discharge Summary (Signed)
Physician Discharge Summary  Patient ID: [name] MRN: [id number] DOB/AGE: October 21, 1945 66 y.o.  Admit date: 10/08/2011 Discharge date: 10/12/2011  Primary Care Physician:  Elie Confer, MD, MD  Discharge Diagnoses:    .TOBACCO USER .HYPERTENSION . nicotine abuse  . acute COPD exacerbation   Anxiety  Consults:  Pulmonology   Discharge Medications: Medication List  As of 10/12/2011  4:57 PM   STOP taking these medications         lisinopril 20 MG tablet         TAKE these medications         ALPRAZolam 0.5 MG tablet   Commonly known as: XANAX   Take 1 tablet (0.5 mg total) by mouth 3 (three) times daily as needed for sleep or anxiety.      amLODipine 10 MG tablet   Commonly known as: NORVASC   Take 1 tablet (10 mg total) by mouth daily.      aspirin 81 MG tablet   Take 81 mg by mouth daily.      budesonide-formoterol 160-4.5 MCG/ACT inhaler   Commonly known as: SYMBICORT   Inhale 2 puffs into the lungs 2 (two) times daily. Rinse mouth        COMBIVENT 18-103 MCG/ACT inhaler   Generic drug: albuterol-ipratropium   INHALE TWO PUFFS BY MOUTH 4 TIMES DAILY AS NEEDED      Ipratropium-Albuterol 20-100 MCG/ACT Aers   Inhale 1 puff into the lungs 4 (four) times daily as needed.      ipratropium-albuterol 0.5-2.5 (3) MG/3ML Soln   Commonly known as: DUONEB   Take 3 mLs by nebulization 3 (three) times daily.      moxifloxacin 400 MG tablet   Commonly known as: AVELOX   Take 1 tablet (400 mg total) by mouth daily.      nicotine 14 mg/24hr patch   Commonly known as: NICODERM CQ - dosed in mg/24 hours   Place 1 patch onto the skin daily.      pantoprazole 40 MG tablet   Commonly known as: PROTONIX   Take 1 tablet (40 mg total) by mouth 2 (two) times daily before a meal.      predniSONE 10 MG tablet   Commonly known as: STERAPRED UNI-PAK   Take 1 tablet (10 mg total) by mouth daily. Prednisone dosing: Take  Prednisone 40mg  (4 tabs) x 3 days, then  taper to 30mg  (3 tabs) x 3 days, then 20mg  (2 tabs) x 3days, then 10mg  (1 tab) x 3days, then OFF.    Dispense:  30 tabs, refills: None             Brief H and P: For complete details please refer to admission H and P, but in brief patient is a 66 year old female with history of COPD, continued nicotine abuse, hypertension presented to the ED with worsening shortness of breath over last 1 week. Patient states that she saw her pulmonologist Dr. Maple Hudson on 10/01/2011 and had just started wheezing at that time and continue to progressively worse in the last 1 week. She denied any fevers or chills, any nausea or vomiting chest pain. She says that the wheezing has been progressively worsening with exertion, she has been using her rescue inhaler in night in the last 1 week.   Hospital Course:  Principal Problem:  *COPD exacerbation - Patient admitted to the telemetry floor and placed on scheduled nebulizer treatment, Symbicort, oxygen supplementation and IV Avelox. Patient was transitioned to by mouth  prednisone at the time of discharge. [facility] pulmonology was consulted given patient's history of intubations in the past. Patient did well during the hospitalization. Home O2 evaluation was done at the time of discharge and patient qualified for 2 L of home O2 with an ambulation. CT angiogram done to rule out PE for hypoxic respiratory failure was negative for pulmonary embolism or any pneumonia. 2-D echocardiogram was done EF 55-65%, normal wall motion. Patient will followup with Dr. Maple Hudson as outpatient per her appointment.  Active Problems:  TOBACCO abuse: patient was strongly counseled to quit smoking and nicotine patches were prescribed at discharge   HYPERTENSION: Lisinopril was discontinued due to cough and COPD and placed on Norvasc      Day of Discharge BP 130/79  Pulse 73  Temp(Src) 97.3 F (36.3 C) (Oral)  Resp 21  Ht 5\' 2"  (1.575 m)  Wt 56.5 kg (124 lb 9 oz)  BMI 22.78 kg/m2  SpO2  94%  Physical Exam: General: Alert and awake oriented x3 not in any acute distress. HEENT: anicteric sclera, pupils reactive to light and accommodation CVS: S1-S2 clear no murmur rubs or gallops Chest: clear to auscultation bilaterally, no wheezing rales or rhonchi Abdomen: soft nontender, nondistended, normal bowel sounds, no organomegaly Extremities: no cyanosis, clubbing or edema noted bilaterally Neuro: Cranial nerves II-XII intact, no focal neurological deficits   The results of significant diagnostics from this hospitalization (including imaging, microbiology, ancillary and laboratory) are listed below for reference.    LAB RESULTS: Basic Metabolic Panel:  Lab 10/09/11 1610 10/08/11 2010 10/08/11 1536  NA 136 -- 137  K 4.0 -- 4.1  CL 100 -- 99  CO2 25 -- 28  GLUCOSE 146* -- 99  BUN 12 -- 6  CREATININE 0.68 0.59 --  CALCIUM 9.4 -- 9.7  MG -- -- --  PHOS -- -- --   Liver Function Tests:  Lab 10/08/11 1536  AST 21  ALT 15  ALKPHOS 67  BILITOT 0.4  PROT 6.9  ALBUMIN 3.9   CBC:  Lab 10/09/11 0525 10/08/11 2010  WBC 6.3 9.0  NEUTROABS -- --  HGB 13.5 13.9  HCT 39.3 40.5  MCV 88.3 --  PLT 254 253   Cardiac Enzymes:  Lab 10/08/11 1543  CKTOTAL 86  CKMB 4.7*  CKMBINDEX --  TROPONINI <0.30    Significant Diagnostic Studies:  Dg Chest 2 View  10/08/2011  *RADIOLOGY REPORT*  Clinical Data: Shortness of breath  CHEST - 2 VIEW  Comparison: August 14, 2011  Findings: The lungs are hyperexpanded, consistent with an element of COPD.  There is chronic blunting of the right costophrenic angle.  The cardiac silhouette, mediastinum, and pulmonary vasculature are within normal limits.  No evidence of infiltrate or effusion.  IMPRESSION: COPD.  No acute findings.  Original Report Authenticated By: Brandon Melnick, M.D.   Ct Angio Chest W/cm &/or Wo Cm  10/08/2011  *RADIOLOGY REPORT*  Clinical Data: Shortness of breath.  Elevated D-dimer.  CT ANGIOGRAPHY CHEST   Technique:  Multidetector CT imaging of the chest using the standard protocol during bolus administration of intravenous contrast. Multiplanar reconstructed images including MIPs were obtained and reviewed to evaluate the vascular anatomy.  Contrast: OMNIPAQUE IOHEXOL 300 MG/ML IV SOLN  Comparison: Unenhanced CT of the chest dated 08/04/2009  Findings: The pulmonary arteries are adequately opacified.  There is no evidence of pulmonary embolism.  Lungs show stable emphysematous disease.  No evidence of focal infiltrate, mass, edema or pleural effusion.  The heart size is normal.  No pericardial fluid.  No enlarged lymph nodes.  The thoracic aorta is of normal caliber.  No bony abnormalities.  IMPRESSION: No evidence of pulmonary embolism or other acute findings.  Stable emphysematous lung disease.  Original Report Authenticated By: Reola Calkins, M.D.     Disposition and Follow-up: Discharge Orders    Future Appointments: Provider: Department: Dept Phone: Center:   10/21/2011 11:15 AM Waymon Budge, MD [facility] [phone] None   01/29/2012 1:45 PM Waymon Budge, MD [facility] [phone] None       DISPOSITION: Home   DIET: Heart healthy   ACTIVITY: As tolerated   DISCHARGE FOLLOW-UP Follow-up Information    Follow up with Waymon Budge, MD on 10/21/2011. (Appt at 11am.  Call Katie when you get home to discuss ECHO results .  [phone])    Contact information:   [address] [employer] [place] [address] [phone]       Follow up with Elie Confer, MD. Schedule an appointment as soon as possible for a visit in 2 weeks.   Contact information:   [address] [place] [address] [phone]          Time spent on Discharge: 45 minutes   Signed:  Aija Scarfo M.D. [facility] 10/12/2011, 4:57 PM

## 2011-10-13 ENCOUNTER — Telehealth: Payer: Self-pay | Admitting: Internal Medicine

## 2011-10-13 NOTE — Progress Notes (Signed)
Quick Note:  Spoke with pt and notified of results per Dr. Young. Pt verbalized understanding and denied any questions.  ______ 

## 2011-10-13 NOTE — Telephone Encounter (Signed)
Spoke with pt. She is requesting ECHO results. I advised her of the results per CDY and she verbalized understanding. She states really does not want to consult with cards b/c they advised her in the hospital that her heart was fine. I advised that she should keep planned HFU with CDY for 10/21/11 and she can discuss her concerns with him. Pt verbalized understanding and states nothing further needed.

## 2011-10-21 ENCOUNTER — Ambulatory Visit (INDEPENDENT_AMBULATORY_CARE_PROVIDER_SITE_OTHER): Payer: Medicare Other | Admitting: Internal Medicine

## 2011-10-21 ENCOUNTER — Encounter: Payer: Self-pay | Admitting: Internal Medicine

## 2011-10-21 VITALS — BP 130/78 | HR 111 | Ht 62.0 in | Wt 120.2 lb

## 2011-10-21 DIAGNOSIS — J449 Chronic obstructive pulmonary disease, unspecified: Secondary | ICD-10-CM

## 2011-10-21 DIAGNOSIS — F17200 Nicotine dependence, unspecified, uncomplicated: Secondary | ICD-10-CM

## 2011-10-21 DIAGNOSIS — J441 Chronic obstructive pulmonary disease with (acute) exacerbation: Secondary | ICD-10-CM

## 2011-10-21 NOTE — Assessment & Plan Note (Signed)
She is now using NicoDerm patches and trying to stay off of cigarettes since recent hospital stay. This was supported and discussed.

## 2011-10-21 NOTE — Assessment & Plan Note (Signed)
Severe emphysema with recent exacerbation probably due to a viral chest infection. She is slowly recovering. I'm concerned that she continues to live alone. I discussed this issue with her daughter in the room and we discussed smoking cessation as well. Plan-evaluate for like portable oxygen system. Recommend oximeter so she can keep her own oxygen saturation over 88%

## 2011-10-21 NOTE — Patient Instructions (Signed)
Order- DME/[facility]-  Evaluate for light portable O2 system  Continuous and portable O2 2-3 L/M  Dx COPD

## 2011-10-21 NOTE — Progress Notes (Signed)
Patient ID: [name], female    DOB: 1945/10/16, 66 y.o.   MRN: [id number]  HPI [age] yoF smoker followed for COPD, tobacco dependence and cough. Centricity EMR reviewed for conversion to Epic.Has had extensive efforts at smoking counseling (husband died of tobacco induced lung cancer).  Comes today with increased cough since March 2, with concerns of relation to mold abatement being done in home. Refrigerator line leaked, later floor buckled and on removal, mold found beneath flooring. Thinks she may have coughed more since recent water leak onto kitchen floor. Denies fever, sore throat or obvious infection. Denies reflux. Has been more wheezey and short of breath Using her Advair and using Combivent 3 x/ day.   06/23/11- 64 yoF smoker followed for COPD, tobacco dependence and cough. She has had additional stress recently, son just had an MI and coronary stent. She has not tried to stop smoking despite counseling. She found Symbicort to be no better than Advair. Changing from a dry powder inhaler had no effect on her cough. Using rescue inhaler a little more. New complaint: Feels tight through the neck and unable to relax her neck and shoulders. A neck brace helps.  08/14/11 - 64 yoF smoker followed for COPD, tobacco dependence and cough. Acute visit- she is still smoking against advice. Has had flu vaccine. Now complains of acute illness for the past week. Chest tight, short of breath. Cough was productive but now is dry. Denies fever, chest pain blood or palpitation. 5 days ago and she accepted a prescription from us for Z-Pak, just finished today, but she says "it never helps". She is particularly concerned about recurrence of mucus plugging, since she understands that was the problem related to a sustained exacerbation with hospital stay in the past. She is using her rescue inhaler every 4 hours. CXR- 06/26/11- stable COPD, NAD.  10/01/11- 64 yoF smoker followed for COPD, tobacco dependence  and cough.  Increased SOB with walking or at rest-gotten worse since Christmas; prior to this was sick 08-14-2011-never regrouped from this. Wakes up at night gasping for air-has had to use rescue inhaler a few times when this happens. She finished prednisone and antibiotic that we had called in December 24 and has now been off of those for about 2 weeks. Stays short of breath as described. We discussed pending change of Combivent to the new delivery device. She is anxious about any change from familiar. Using Xanax about once daily. PFT- 07/07/11- severe obstructive airways disease with response to bronchodilator, hyperinflation, diffusion moderately reduced. FEV1 0.73/38%, FEV1/FVC 0.37, DLCO 45%. Loop configuration is emphysema.  10/21/11- 64 yoF smoker followed for COPD, tobacco dependence and cough. Post hospital-  Pt states increase SOB upon activity ,wheezing,denies any cough.     Daughter [name] is here Hospitalized in January 17 through 10/12/2011. She got progressively worse after last visit here and went to the emergency room. Treated for COPD exacerbation. Now she still is not quite back to her baseline but says she has not smoked since leaving the hospital. I picked on this and try to reinforce it with support. Especially short of breath with any exertion. She is wearing oxygen but once the office so she can continue to live alone and do her own yard work. October I could not, as we could get her back off. Lisinopril was changed to Norvasc and I discussed potential for lisinopril to be associated with dry cough and some people. She is tapering off of prednisone. Echocardiogram  reviewed and discussed-pulmonary artery systolic 43  Review of Systems-see HPI Constitutional:   No-   weight loss, night sweats, fevers, chills, fatigue, lassitude. HEENT:   No-  headaches, difficulty swallowing, tooth/dental problems, sore throat,       No-  sneezing, itching, ear ache, nasal congestion, post nasal  drip,  CV:  No- chest pain, orthopnea, PND, swelling in lower extremities, anasarca, dizziness, palpitations.  Resp: Per HPI-     +shortness of breath with exertion or at rest.              Little  productive cough,  + non-productive cough,  No-  coughing up of blood.              No-   change in color of mucus.  + wheezing.   Skin: No-   rash or lesions. GI:  No-   heartburn, indigestion, abdominal pain, nausea, vomiting, diarrhea,                 change in bowel habits, loss of appetite GU: MS:  No-   joint pain or swelling.  No- decreased range of motion.  No- back pain. Neuro-  Psych:  No- change in mood or affect. + depression or anxiety.  No memory loss.   Objective:   Physical Exam General- Alert, Oriented, Affect-mildly anxious, Distress- none acute, conversational, O2 2 L-sat 93% Skin- rash-none, lesions- none, excoriation- none Lymphadenopathy- none Head- atraumatic            Eyes- Gross vision intact, PERRLA, conjunctivae clear secretions            Ears- Hearing, canals-normal            Nose- Clear, no-Septal dev, mucus, polyps, erosion, perforation. Sniffing.             Throat- Mallampati II , mucosa clear , drainage- none, tonsils- atrophic. Frequent throat clearing.  Neck-  trachea midline, no stridor , thyroid nl, carotid no bruit. Chest - symmetrical excursion , unlabored           Heart/CV- RRR , no murmur , no gallop  , no rub, nl s1 s2                           - JVD- none , edema- none, stasis changes- none, varices- none           Lung- wheeze w/ deep breath, unlabored, cough- mild , dullness-none, rub- none           Chest wall-  Abd- Br/ Gen/ Rectal- Not done, not indicated Extrem- cyanosis- none, clubbing, none, atrophy- none, strength- nl Neuro- grossly intact to observation

## 2011-12-03 ENCOUNTER — Ambulatory Visit (INDEPENDENT_AMBULATORY_CARE_PROVIDER_SITE_OTHER): Payer: Medicare Other | Admitting: Internal Medicine

## 2011-12-03 ENCOUNTER — Encounter: Payer: Self-pay | Admitting: Internal Medicine

## 2011-12-03 VITALS — BP 122/72 | HR 87 | Ht 62.0 in | Wt 125.6 lb

## 2011-12-03 DIAGNOSIS — F419 Anxiety disorder, unspecified: Secondary | ICD-10-CM

## 2011-12-03 DIAGNOSIS — J449 Chronic obstructive pulmonary disease, unspecified: Secondary | ICD-10-CM

## 2011-12-03 DIAGNOSIS — F17200 Nicotine dependence, unspecified, uncomplicated: Secondary | ICD-10-CM

## 2011-12-03 DIAGNOSIS — F411 Generalized anxiety disorder: Secondary | ICD-10-CM

## 2011-12-03 MED ORDER — ALPRAZOLAM 0.5 MG PO TABS: 0.5000 mg | ORAL_TABLET | Freq: Three times a day (TID) | ORAL | Status: DC | PRN

## 2011-12-03 NOTE — Progress Notes (Signed)
Patient ID: [name], female    DOB: 1945/10/16, 66 y.o.   MRN: [id number]  HPI [age] yoF smoker followed for COPD, tobacco dependence and cough. Centricity EMR reviewed for conversion to Epic.Has had extensive efforts at smoking counseling (husband died of tobacco induced lung cancer).  Comes today with increased cough since March 2, with concerns of relation to mold abatement being done in home. Refrigerator line leaked, later floor buckled and on removal, mold found beneath flooring. Thinks she may have coughed more since recent water leak onto kitchen floor. Denies fever, sore throat or obvious infection. Denies reflux. Has been more wheezey and short of breath Using her Advair and using Combivent 3 x/ day.   06/23/11- 64 yoF smoker followed for COPD, tobacco dependence and cough. She has had additional stress recently, son just had an MI and coronary stent. She has not tried to stop smoking despite counseling. She found Symbicort to be no better than Advair. Changing from a dry powder inhaler had no effect on her cough. Using rescue inhaler a little more. New complaint: Feels tight through the neck and unable to relax her neck and shoulders. A neck brace helps.  08/14/11 - 64 yoF smoker followed for COPD, tobacco dependence and cough. Acute visit- she is still smoking against advice. Has had flu vaccine. Now complains of acute illness for the past week. Chest tight, short of breath. Cough was productive but now is dry. Denies fever, chest pain blood or palpitation. 5 days ago and she accepted a prescription from us for Z-Pak, just finished today, but she says "it never helps". She is particularly concerned about recurrence of mucus plugging, since she understands that was the problem related to a sustained exacerbation with hospital stay in the past. She is using her rescue inhaler every 4 hours. CXR- 06/26/11- stable COPD, NAD.  10/01/11- 64 yoF smoker followed for COPD, tobacco dependence  and cough.  Increased SOB with walking or at rest-gotten worse since Christmas; prior to this was sick 08-14-2011-never regrouped from this. Wakes up at night gasping for air-has had to use rescue inhaler a few times when this happens. She finished prednisone and antibiotic that we had called in December 24 and has now been off of those for about 2 weeks. Stays short of breath as described. We discussed pending change of Combivent to the new delivery device. She is anxious about any change from familiar. Using Xanax about once daily. PFT- 07/07/11- severe obstructive airways disease with response to bronchodilator, hyperinflation, diffusion moderately reduced. FEV1 0.73/38%, FEV1/FVC 0.37, DLCO 45%. Loop configuration is emphysema.  10/21/11- 64 yoF smoker followed for COPD, tobacco dependence and cough. Post hospital-  Pt states increase SOB upon activity ,wheezing,denies any cough.     Daughter [name] is here Hospitalized in January 17 through 10/12/2011. She got progressively worse after last visit here and went to the emergency room. Treated for COPD exacerbation. Now she still is not quite back to her baseline but says she has not smoked since leaving the hospital. I picked on this and try to reinforce it with support. Especially short of breath with any exertion. She is wearing oxygen but once the office so she can continue to live alone and do her own yard work. October I could not, as we could get her back off. Lisinopril was changed to Norvasc and I discussed potential for lisinopril to be associated with dry cough and some people. She is tapering off of prednisone. Echocardiogram  reviewed and discussed-pulmonary artery systolic   12/03/11- 64 yoF smoker followed for COPD, tobacco dependence and cough. She  still needs oxygen sometimes during the day. We discussed oxygen delivery devices. Little cough or phlegm. Had Pneumovax.  Review of Systems-see HPI Constitutional:   No-   weight loss,  night sweats, fevers, chills, fatigue, lassitude. HEENT:   No-  headaches, difficulty swallowing, tooth/dental problems, sore throat,       No-  sneezing, itching, ear ache, nasal congestion, post nasal drip,  CV:  No- chest pain, orthopnea, PND, swelling in lower extremities, anasarca, dizziness, palpitations.  Resp: Per HPI-     +shortness of breath with exertion or at rest.              Little  productive cough,  + non-productive cough,  No-  coughing up of blood.              No-   change in color of mucus.  + wheezing.   Skin: No-   rash or lesions. GI:  No-   heartburn, indigestion, abdominal pain, nausea, vomiting,  GU: MS:  No-   joint pain or swelling.   Neuro-  Psych:  No- change in mood or affect. + depression or anxiety.  No memory loss.   Objective:   Physical Exam General- Alert, Oriented, Affect-mildly anxious, Distress- none acute, conversational, O2 2 L-sat 95% Skin- rash-none, lesions- none, excoriation- none Lymphadenopathy- none Head- atraumatic            Eyes- Gross vision intact, PERRLA, conjunctivae clear secretions            Ears- Hearing, canals-normal            Nose- Clear, no-Septal dev, mucus, polyps, erosion, perforation. Sniffing.             Throat- Mallampati II , mucosa clear , drainage- none, tonsils- atrophic.   Neck-  trachea midline, no stridor , thyroid nl, carotid no bruit. Chest - symmetrical excursion , unlabored           Heart/CV- RRR , no murmur , no gallop  , no rub, nl s1 s2                           - JVD- none , edema- none, stasis changes- none, varices- none           Lung- distant/ clear with bronchitic cough, unlabored, cough- mild , dullness-none, rub- none           Chest wall-  Abd- Br/ Gen/ Rectal- Not done, not indicated Extrem- cyanosis- none, clubbing, none, atrophy- none, strength- nl Neuro- grossly intact to observation

## 2011-12-03 NOTE — Patient Instructions (Signed)
Refill script for Xanax

## 2011-12-06 ENCOUNTER — Encounter: Payer: Self-pay | Admitting: Internal Medicine

## 2011-12-06 DIAGNOSIS — F419 Anxiety disorder, unspecified: Secondary | ICD-10-CM | POA: Insufficient documentation

## 2011-12-06 NOTE — Assessment & Plan Note (Signed)
I attempted to motivate her to stay off of cigarettes. She had come close .

## 2011-12-06 NOTE — Assessment & Plan Note (Signed)
Situational anxiety and depression. Plan-refill Xanax.

## 2011-12-06 NOTE — Assessment & Plan Note (Addendum)
Nearing baseline. Chronic hypoxic respiratory failure.

## 2011-12-22 ENCOUNTER — Other Ambulatory Visit: Payer: Self-pay | Admitting: Internal Medicine

## 2011-12-28 ENCOUNTER — Telehealth: Payer: Self-pay | Admitting: Internal Medicine

## 2011-12-28 NOTE — Telephone Encounter (Signed)
Pt states insurance called her randomly and asked her if she had ever spoken to [facility] about an exercise program for her; she stated no and the recording stated that its something she should talk to CY about-pt has appt in July-okay to keep that appt.

## 2012-01-13 ENCOUNTER — Telehealth: Payer: Self-pay | Admitting: Internal Medicine

## 2012-01-13 MED ORDER — IPRATROPIUM-ALBUTEROL 0.5-2.5 (3) MG/3ML IN SOLN: 3.0000 mL | Freq: Two times a day (BID) | RESPIRATORY_TRACT | Status: DC

## 2012-01-13 NOTE — Telephone Encounter (Signed)
I spoke with pt and she needed a new rx for her duoneb sent to [facility] w/ dx on it. I advised will send new rx. Also pt requesting a sample of the symbicort. I have left 1 upfront for pick up. Nothing further was needed

## 2012-01-18 ENCOUNTER — Telehealth: Payer: Self-pay | Admitting: Internal Medicine

## 2012-01-18 MED ORDER — PREDNISONE 10 MG PO TABS: ORAL_TABLET | ORAL | Status: DC

## 2012-01-18 NOTE — Telephone Encounter (Signed)
I spoke with [name] and she c/o increase SOB, nasal congestion, head congestion, PND, wheezing, chest tx, cough w. Thick white phlem x Friday. Denies any f/c/s/n/v. [name] is requesting to be seen but nothing available. [name] has not tried anything OTC. Please advise Dr. Maple Hudson, thanks  No Known Allergies   [facility]

## 2012-01-18 NOTE — Telephone Encounter (Signed)
Per CY-give patient Presnisone 10 mg #20 take 4 x 2 days, 3 x 2 days, 2 x 2 days, 1 x 1 days then stop no refills.

## 2012-01-18 NOTE — Telephone Encounter (Signed)
I spoke with pt and is aware of CDY recs. She voiced her understanding and rx has been sent to the pharmacy 

## 2012-01-21 ENCOUNTER — Telehealth: Payer: Self-pay | Admitting: Internal Medicine

## 2012-01-21 MED ORDER — AMOXICILLIN-POT CLAVULANATE 875-125 MG PO TABS: 1.0000 | ORAL_TABLET | Freq: Two times a day (BID) | ORAL | Status: DC

## 2012-01-21 NOTE — Telephone Encounter (Signed)
Per CY-okay to give patient Augmentin 875 mg #14 take 1 po bid no refills.  

## 2012-01-21 NOTE — Telephone Encounter (Signed)
I spoke with pt and she c/o increase SOB, nasal congestion, head congestion, PND, wheezing, chest tx, cough w. Thick white phlem, ears popping x Friday. Denies any f/c/s/n/v. Pt is still taking prednisone given to her on Monday. Pt is requesting an abx. Please advise Dr. Maple Hudson, thanks  No Known Allergies   [facility]

## 2012-01-21 NOTE — Telephone Encounter (Signed)
Called and spoke with pt. Pt aware rx sent to pharmacy.   

## 2012-01-22 ENCOUNTER — Telehealth: Payer: Self-pay | Admitting: Internal Medicine

## 2012-01-22 NOTE — Telephone Encounter (Signed)
Pred taper given on 4.29.13 > called spoke with patient, breathing is no better; unable to take a shower.  Currently taking 2 tabs daily on the taper.  Began taking the augmentin last PM.    Using neb, rescue inhaler.  Feels like she is "smothering inside", prod cough with thick white mucus, wheezing, tightness in chest, felt like she has been running a fever w/ chills yesterday.  CT chest done 09/2011.  appt scheduled with CDY 5.6.13 @ 2:15pm but pt is requesting further recs or work-in appt today.  Dr Maple Hudson please advise, thanks.

## 2012-01-22 NOTE — Telephone Encounter (Signed)
I called pt at approx 12:45 and informed her of below per Dr. Maple Hudson.  She verbalized understanding of Dr. Roxy Cedar recs.    NOTE:  This is a late entry as the computer system went down during my call with Ms. Belinsky.

## 2012-01-22 NOTE — Telephone Encounter (Signed)
Per Cy-no openings; pt will need to go to ER.

## 2012-01-25 ENCOUNTER — Ambulatory Visit (INDEPENDENT_AMBULATORY_CARE_PROVIDER_SITE_OTHER): Payer: Medicare Other | Admitting: Internal Medicine

## 2012-01-25 ENCOUNTER — Encounter: Payer: Self-pay | Admitting: Internal Medicine

## 2012-01-25 VITALS — BP 120/78 | HR 82 | Ht 62.0 in | Wt 126.0 lb

## 2012-01-25 DIAGNOSIS — R0602 Shortness of breath: Secondary | ICD-10-CM

## 2012-01-25 DIAGNOSIS — J441 Chronic obstructive pulmonary disease with (acute) exacerbation: Secondary | ICD-10-CM

## 2012-01-25 MED ORDER — PHENYLEPHRINE HCL 1 % NA SOLN
1.0000 [drp] | Freq: Once | NASAL | Status: AC
  Administered 2012-01-25: 1 [drp] via NASAL

## 2012-01-25 MED ORDER — METHYLPREDNISOLONE ACETATE 80 MG/ML IJ SUSP
80.0000 mg | Freq: Once | INTRAMUSCULAR | Status: AC
  Administered 2012-01-25: 80 mg via INTRAMUSCULAR

## 2012-01-25 NOTE — Progress Notes (Signed)
Patient ID: Kaitlyn MoralesFrances L Ibarra, female    DOB: 1945/10/16, 66 y.o.   MRN: [id number]  HPI [age] yoF smoker followed for COPD, tobacco dependence and cough. Centricity EMR reviewed for conversion to Epic.Has had extensive efforts at smoking counseling (husband died of tobacco induced lung cancer).  Comes today with increased cough since March 2, with concerns of relation to mold abatement being done in home. Refrigerator line leaked, later floor buckled and on removal, mold found beneath flooring. Thinks she may have coughed more since recent water leak onto kitchen floor. Denies fever, sore throat or obvious infection. Denies reflux. Has been more wheezey and short of breath Using her Advair and using Combivent 3 x/ day.   06/23/11- 64 yoF smoker followed for COPD, tobacco dependence and cough. She has had additional stress recently, son just had an MI and coronary stent. She has not tried to stop smoking despite counseling. She found Symbicort to be no better than Advair. Changing from a dry powder inhaler had no effect on her cough. Using rescue inhaler a little more. New complaint: Feels tight through the neck and unable to relax her neck and shoulders. A neck brace helps.  08/14/11 - 64 yoF smoker followed for COPD, tobacco dependence and cough. Acute visit- she is still smoking against advice. Has had flu vaccine. Now complains of acute illness for the past week. Chest tight, short of breath. Cough was productive but now is dry. Denies fever, chest pain blood or palpitation. 5 days ago and she accepted a prescription from us for Z-Pak, just finished today, but she says "it never helps". She is particularly concerned about recurrence of mucus plugging, since she understands that was the problem related to a sustained exacerbation with hospital stay in the past. She is using her rescue inhaler every 4 hours. CXR- 06/26/11- stable COPD, NAD.  10/01/11- 64 yoF smoker followed for COPD, tobacco dependence  and cough.  Increased SOB with walking or at rest-gotten worse since Christmas; prior to this was sick 08-14-2011-never regrouped from this. Wakes up at night gasping for air-has had to use rescue inhaler a few times when this happens. She finished prednisone and antibiotic that we had called in December 24 and has now been off of those for about 2 weeks. Stays short of breath as described. We discussed pending change of Combivent to the new delivery device. She is anxious about any change from familiar. Using Xanax about once daily. PFT- 07/07/11- severe obstructive airways disease with response to bronchodilator, hyperinflation, diffusion moderately reduced. FEV1 0.73/38%, FEV1/FVC 0.37, DLCO 45%. Loop configuration is emphysema.  10/21/11- 64 yoF smoker followed for COPD, tobacco dependence and cough. Post hospital-  Pt states increase SOB upon activity ,wheezing,denies any cough.     Daughter [name] is here Hospitalized in January 17 through 10/12/2011. She got progressively worse after last visit here and went to the emergency room. Treated for COPD exacerbation. Now she still is not quite back to her baseline but says she has not smoked since leaving the hospital. I picked on this and try to reinforce it with support. Especially short of breath with any exertion. She is wearing oxygen but once the office so she can continue to live alone and do her own yard work. October I could not, as we could get her back off. Lisinopril was changed to Norvasc and I discussed potential for lisinopril to be associated with dry cough and some people. She is tapering off of prednisone. Echocardiogram  reviewed and discussed-pulmonary artery systolic   12/03/11- 64 yoF smoker followed for COPD, tobacco dependence and cough. She  still needs oxygen sometimes during the day. We discussed oxygen delivery devices. Little cough or phlegm. Had Pneumovax.  01/25/12- 64 yoF smoker followed for COPD, tobacco dependence and  cough.  Daughter here Acute visit-SOB getting worse in the past week; Prednisone was called in last week(took last pill this am); had head congestion, ears popping last week-called in ABX/ augmentin-not working. Friday called and was told to go to ER-pt did not go. Coughing-productive-looks like slight yellow in color and thick-feels better once getting it up; having to wear O2 several times during the day.   Review of Systems-see HPI Constitutional:   No-   weight loss, night sweats, fevers, chills, fatigue, lassitude. HEENT:   No-  headaches, difficulty swallowing, tooth/dental problems, sore throat,       +  sneezing, itching, ear ache, nasal congestion, post nasal drip,  CV:  No- chest pain, orthopnea, PND, swelling in lower extremities, anasarca, dizziness, palpitations.  Resp: Per HPI-     +shortness of breath with exertion or at rest.              +  productive cough,  + non-productive cough,  No-  coughing up of blood.              +  change in color of mucus.  + wheezing.   Skin: No-   rash or lesions. GI:  No-   heartburn, indigestion, abdominal pain, nausea, vomiting,  GU: MS:  No-   joint pain or swelling.   Neuro-  Psych:  No- change in mood or affect. + depression or anxiety.  No memory loss.   Objective:   Physical Exam General- Alert, Oriented, Affect-mildly anxious, Distress- none acute, conversational, O2 2 L-sat 95% Skin- rash-none, lesions- none, excoriation- none Lymphadenopathy- none Head- atraumatic            Eyes- Gross vision intact, PERRLA, conjunctivae clear secretions            Ears- Hearing, canals-normal            Nose- Clear, no-Septal dev, mucus, polyps, erosion, perforation. +Sniffing.             Throat- Mallampati II , mucosa clear , drainage- none, tonsils- atrophic.   Neck-  trachea midline, no stridor , thyroid nl, carotid no bruit. Chest - symmetrical excursion , unlabored           Heart/CV- RRR , no murmur , no gallop  , no rub, nl s1 s2                            - JVD- none , edema- none, stasis changes- none, varices- none           Lung- mild wheeze with bronchitic cough, unlabored, cough- mild , dullness-none, rub- none           Chest wall-  Abd- Br/ Gen/ Rectal- Not done, not indicated Extrem- cyanosis- none, clubbing, none, atrophy- none, strength- nl Neuro- grossly intact to observation

## 2012-01-25 NOTE — Patient Instructions (Signed)
Finish augmentin/ amoxacillin  Neb neo nasal  Depo 80  Sample/ script Daliresp  Airway anti-inflammatory      Start with 1/2 tab after a meal, every other day. After 2 weeks if tolerated, then try 1/2 tab daily. Gradually work your way up to 1 pill, once daily.

## 2012-01-27 NOTE — Assessment & Plan Note (Addendum)
3 slow progress with active bronchitis component and a residual URI as she finishes Augmentin and prednisone. We will try to work from top down -- Plan- nasal nebulizer decongestant, Depo-Medrol, finish amoxicillin and then call for update.Daliresp

## 2012-01-29 ENCOUNTER — Ambulatory Visit: Payer: Medicare Other | Admitting: Internal Medicine

## 2012-02-02 ENCOUNTER — Other Ambulatory Visit: Payer: Self-pay | Admitting: Internal Medicine

## 2012-02-02 NOTE — Telephone Encounter (Signed)
Please advise if okay to refill. Thanks.  

## 2012-02-02 NOTE — Telephone Encounter (Signed)
Ok to refilll

## 2012-02-17 ENCOUNTER — Other Ambulatory Visit: Payer: Self-pay | Admitting: *Deleted

## 2012-02-17 MED ORDER — IPRATROPIUM-ALBUTEROL 0.5-2.5 (3) MG/3ML IN SOLN: 3.0000 mL | Freq: Two times a day (BID) | RESPIRATORY_TRACT | Status: DC

## 2012-02-26 ENCOUNTER — Telehealth: Payer: Self-pay | Admitting: Internal Medicine

## 2012-02-26 NOTE — Telephone Encounter (Signed)
Spoke with pt and notified that 1 sample symbicort up front for pick up. Pt verbalized understanding and states nothing further needed.

## 2012-03-22 ENCOUNTER — Telehealth: Payer: Self-pay | Admitting: Internal Medicine

## 2012-03-22 NOTE — Telephone Encounter (Signed)
Papers in CY look at folder to sign and return to me ASAP

## 2012-03-22 NOTE — Telephone Encounter (Signed)
Received faxed request from [facility] requesting addition medicare information for pt's duoneb.  Is requesting < 5 refills to be medicare compliant, a diagnosis code and physician signature and date.    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  This information was written on the faxed request and placed on CY's cart to be signed/dated.  Copy of this message placed with the request.  Last ov 01-25-12, next 04-12-12.

## 2012-03-23 NOTE — Telephone Encounter (Signed)
This has been faxed back as requested.

## 2012-03-29 ENCOUNTER — Telehealth: Payer: Self-pay | Admitting: Internal Medicine

## 2012-03-29 NOTE — Telephone Encounter (Signed)
I spoke with pt and is aware she will need to bring in her jury duty letter. She states she has an apt with CDY next week and will bring it at her next OV. She needed nothing further

## 2012-04-04 ENCOUNTER — Other Ambulatory Visit: Payer: Self-pay | Admitting: Internal Medicine

## 2012-04-06 NOTE — Telephone Encounter (Signed)
Please advise if okay to refill. 

## 2012-04-06 NOTE — Telephone Encounter (Signed)
Ok to refill 

## 2012-04-07 ENCOUNTER — Ambulatory Visit: Payer: Medicare Other | Admitting: Internal Medicine

## 2012-04-12 ENCOUNTER — Encounter: Payer: Self-pay | Admitting: Internal Medicine

## 2012-04-12 ENCOUNTER — Ambulatory Visit (INDEPENDENT_AMBULATORY_CARE_PROVIDER_SITE_OTHER): Payer: Medicare Other | Admitting: Internal Medicine

## 2012-04-12 VITALS — BP 122/70 | HR 92 | Ht 62.0 in | Wt 122.8 lb

## 2012-04-12 DIAGNOSIS — J4489 Other specified chronic obstructive pulmonary disease: Secondary | ICD-10-CM

## 2012-04-12 DIAGNOSIS — J449 Chronic obstructive pulmonary disease, unspecified: Secondary | ICD-10-CM

## 2012-04-12 DIAGNOSIS — F17200 Nicotine dependence, unspecified, uncomplicated: Secondary | ICD-10-CM

## 2012-04-12 MED ORDER — IPRATROPIUM-ALBUTEROL 20-100 MCG/ACT IN AERS: 1.0000 | INHALATION_SPRAY | Freq: Four times a day (QID) | RESPIRATORY_TRACT | Status: DC | PRN

## 2012-04-12 NOTE — Patient Instructions (Addendum)
This would be a great time to stop smoking  The Combivent Respimat dose is 1 puff (not 2) up to 4 times daily if needed as a rescue inhaler.  I will write and send your jury duty letter

## 2012-04-12 NOTE — Progress Notes (Signed)
Patient ID: [name], female    DOB: 1945/10/16, 66 y.o.   MRN: [id number]  HPI [age] yoF smoker followed for COPD, tobacco dependence and cough. Centricity EMR reviewed for conversion to Epic.Has had extensive efforts at smoking counseling (husband died of tobacco induced lung cancer).  Comes today with increased cough since March 2, with concerns of relation to mold abatement being done in home. Refrigerator line leaked, later floor buckled and on removal, mold found beneath flooring. Thinks she may have coughed more since recent water leak onto kitchen floor. Denies fever, sore throat or obvious infection. Denies reflux. Has been more wheezey and short of breath Using her Advair and using Combivent 3 x/ day.   06/23/11- 64 yoF smoker followed for COPD, tobacco dependence and cough. She has had additional stress recently, son just had an MI and coronary stent. She has not tried to stop smoking despite counseling. She found Symbicort to be no better than Advair. Changing from a dry powder inhaler had no effect on her cough. Using rescue inhaler a little more. New complaint: Feels tight through the neck and unable to relax her neck and shoulders. A neck brace helps.  08/14/11 - 64 yoF smoker followed for COPD, tobacco dependence and cough. Acute visit- she is still smoking against advice. Has had flu vaccine. Now complains of acute illness for the past week. Chest tight, short of breath. Cough was productive but now is dry. Denies fever, chest pain blood or palpitation. 5 days ago and she accepted a prescription from us for Z-Pak, just finished today, but she says "it never helps". She is particularly concerned about recurrence of mucus plugging, since she understands that was the problem related to a sustained exacerbation with hospital stay in the past. She is using her rescue inhaler every 4 hours. CXR- 06/26/11- stable COPD, NAD.  10/01/11- 64 yoF smoker followed for COPD, tobacco dependence  and cough.  Increased SOB with walking or at rest-gotten worse since Christmas; prior to this was sick 08-14-2011-never regrouped from this. Wakes up at night gasping for air-has had to use rescue inhaler a few times when this happens. She finished prednisone and antibiotic that we had called in December 24 and has now been off of those for about 2 weeks. Stays short of breath as described. We discussed pending change of Combivent to the new delivery device. She is anxious about any change from familiar. Using Xanax about once daily. PFT- 07/07/11- severe obstructive airways disease with response to bronchodilator, hyperinflation, diffusion moderately reduced. FEV1 0.73/38%, FEV1/FVC 0.37, DLCO 45%. Loop configuration is emphysema.  10/21/11- 64 yoF smoker followed for COPD, tobacco dependence and cough. Post hospital-  Pt states increase SOB upon activity ,wheezing,denies any cough.     Daughter Kaitlyn RombergDrenda is here Hospitalized in January 17 through 10/12/2011. She got progressively worse after last visit here and went to the emergency room. Treated for COPD exacerbation. Now she still is not quite back to her baseline but says she has not smoked since leaving the hospital. I picked on this and try to reinforce it with support. Especially short of breath with any exertion. She is wearing oxygen but once the office so she can continue to live alone and do her own yard work. October I could not, as we could get her back off. Lisinopril was changed to Norvasc and I discussed potential for lisinopril to be associated with dry cough and some people. She is tapering off of prednisone. Echocardiogram  reviewed and discussed-pulmonary artery systolic   12/03/11- 64 yoF smoker followed for COPD, tobacco dependence and cough. She  still needs oxygen sometimes during the day. We discussed oxygen delivery devices. Little cough or phlegm. Had Pneumovax.  01/25/12- 64 yoF smoker followed for COPD, tobacco dependence and  cough.  Daughter here Acute visit-SOB getting worse in the past week; Prednisone was called in last week(took last pill this am); had head congestion, ears popping last week-called in ABX/ augmentin-not working. Friday called and was told to go to ER-pt did not go. Coughing-productive-looks like slight yellow in color and thick-feels better once getting it up; having to wear O2 several times during the day.   04/12/12-  64 yoF smoker followed for COPD, tobacco dependence and cough.   States about the same. c/o difficulty breathing and using oxygen more often due to the hot weather. Also occassional cough and wheezing.  COPD assessment test (CAT) score 20/40 Shee has a jury summons which we discussed. She is oxygen dependent and cannot count on her portable oxygen to last the length of a jury obligation. She stays indoors to avoid the heat and humidity, getting others to mow her yard.   Review of Systems-see HPI Constitutional:   No-   weight loss, night sweats, fevers, chills, fatigue, lassitude. HEENT:   No-  headaches, difficulty swallowing, tooth/dental problems, sore throat,       +  sneezing, itching, ear ache, nasal congestion, post nasal drip,  CV:  No- chest pain, orthopnea, PND, swelling in lower extremities, anasarca, dizziness, palpitations.  Resp: Per HPI-     +shortness of breath with exertion or at rest.              +  productive cough,  + non-productive cough,  No-  coughing up of blood.              +  change in color of mucus.  + wheezing.   Skin: No-   rash or lesions. GI:  No-   heartburn, indigestion, abdominal pain, nausea, vomiting,  GU: MS:  No-   joint pain or swelling.   Neuro-  Psych:  No- change in mood or affect. + depression or anxiety.  No memory loss.   Objective:   Physical Exam General- Alert, Oriented, Affect-not anxious, Distress- none acute, conversational, O2 2 L-sat 95% Skin- rash-none, lesions- none, excoriation- none Lymphadenopathy- none Head-  atraumatic            Eyes- Gross vision intact, PERRLA, conjunctivae clear secretions            Ears- Hearing, canals-normal            Nose- Clear, no-Septal dev, mucus, polyps, erosion, perforation. +Sniffing.             Throat- Mallampati II , mucosa clear , drainage- none, tonsils- atrophic.   Neck-  trachea midline, no stridor , thyroid nl, carotid no bruit. Chest - symmetrical excursion , unlabored           Heart/CV- RRR , no murmur , no gallop  , no rub, nl s1 s2                           - JVD- none , edema- none, stasis changes- none, varices- none           Lung- decreased but clear, unlabored, cough- mild , dullness-none, rub- none  Chest wall-  Abd- Br/ Gen/ Rectal- Not done, not indicated Extrem- cyanosis- none, clubbing, none, atrophy- none, strength- nl Neuro- grossly intact to observation

## 2012-04-16 NOTE — Assessment & Plan Note (Signed)
Chronic hypoxic respiratory failure. She is doing as well now as I think she likely will.

## 2012-04-16 NOTE — Assessment & Plan Note (Signed)
Through all of this, she really is not making any effort to stop smoking. I have again offered her support and suggestions.

## 2012-04-19 NOTE — Letter (Addendum)
April 18, 2012   Trial [employer] Administrator Attention:  Lonn Georgia Request [address] [place], [place] [address].  RE:  Kaitlyn, Ibarra MRN:  [id number]  /  DOB:  08-Feb-1946  To Whom It May Concern:  [name] is a 66 year old woman under my active medical care, most recently seen on April 12, 2012.  She is markedly limited by severe chronic obstructive pulmonary disease.  She is dependent on supplemental oxygen and her portable oxygen system will not last for the potential duration of a Cytogeneticist responsibility.  I am recommending permanent excusal. Her medical condition will not improve.   Sincerely,     Kramer Hanrahan D. Maple Hudson, MD, Tonny Bollman, FACP Electronically Signed   CDY/MedQ  DD: 04/18/2012  DT: 04/19/2012  Job #: [id number]

## 2012-04-20 ENCOUNTER — Telehealth: Payer: Self-pay | Admitting: Internal Medicine

## 2012-04-20 NOTE — Telephone Encounter (Signed)
I think that letter has been signed and sent with form- Katie please let patient know.

## 2012-04-20 NOTE — Telephone Encounter (Signed)
I spoke with pt and advised her will have to check with Dr. Maple Hudson and Florentina Addison to see if this has been done. She is aware they both are out of the office this afternoon and is fine with a call back tomorrow. Show's letter has been completed but not sure if it has been mailed out yet or not. Please advise Dr. Maple Hudson and Florentina Addison thanks

## 2012-04-21 NOTE — Telephone Encounter (Signed)
Please let patient know that the letter has been sent already.

## 2012-04-21 NOTE — Telephone Encounter (Signed)
Called, spoke with pt.  Informed letter has been sent. She verbalized understanding of this.  She is requesting we mail a copy of this letter to her home address incase she needs it for a reference -- I did verify mailing address with her.  She is aware copy will not be on letterhead and is ok with this.

## 2012-05-09 ENCOUNTER — Telehealth: Payer: Self-pay | Admitting: Internal Medicine

## 2012-05-09 NOTE — Telephone Encounter (Signed)
Sample at front and pt aware

## 2012-05-26 ENCOUNTER — Telehealth: Payer: Self-pay | Admitting: Internal Medicine

## 2012-05-26 NOTE — Telephone Encounter (Signed)
1 sample left at front. Pt is aware.Carron Curie, CMA

## 2012-06-02 ENCOUNTER — Other Ambulatory Visit: Payer: Self-pay | Admitting: Internal Medicine

## 2012-06-08 ENCOUNTER — Telehealth: Payer: Self-pay | Admitting: Internal Medicine

## 2012-06-08 NOTE — Telephone Encounter (Signed)
Spoke with patient-aware that RX has been called to pharmacy.

## 2012-06-08 NOTE — Telephone Encounter (Signed)
Pt called back again. She says that this was requested last week and she wants this called in today. Doesn't understand why she is having to wait. [name]

## 2012-06-08 NOTE — Telephone Encounter (Signed)
Ok to refill 

## 2012-06-08 NOTE — Telephone Encounter (Signed)
:  ast seen 04/12/12. Pending appt 07/14/12. Pls advise if okay to send refills on Alprazolam.

## 2012-06-08 NOTE — Telephone Encounter (Signed)
Please advise if okay to refill. Thanks.  

## 2012-06-08 NOTE — Telephone Encounter (Signed)
Last OV 04/12/12, next OV 07/14/12. Pt is requesting a refill on xanax. Last fill on 04-02-12. Please advise if ok to refill. Carron Curie, CMA

## 2012-06-23 ENCOUNTER — Telehealth: Payer: Self-pay | Admitting: Internal Medicine

## 2012-06-23 MED ORDER — BUDESONIDE-FORMOTEROL FUMARATE 160-4.5 MCG/ACT IN AERO: 2.0000 | INHALATION_SPRAY | Freq: Two times a day (BID) | RESPIRATORY_TRACT | Status: DC

## 2012-06-23 NOTE — Telephone Encounter (Signed)
Yes okay to give 1 sample; please see if patient is having a hard time buying medication. If so please suggest Pt assistance program.

## 2012-06-23 NOTE — Telephone Encounter (Signed)
Pt aware of sample and will pick this plater today. I have also placed a pt assistance form in with the sample and told the pt to fill this out and return to Dr. Sinclair Ship nurse to see if she qualify's for assistance.

## 2012-06-23 NOTE — Telephone Encounter (Signed)
Please advise if okay to give sample.thanks

## 2012-07-14 ENCOUNTER — Encounter: Payer: Self-pay | Admitting: Internal Medicine

## 2012-07-14 ENCOUNTER — Ambulatory Visit (INDEPENDENT_AMBULATORY_CARE_PROVIDER_SITE_OTHER): Payer: Medicare Other | Admitting: Internal Medicine

## 2012-07-14 VITALS — BP 132/78 | HR 89 | Ht 62.0 in | Wt 124.2 lb

## 2012-07-14 DIAGNOSIS — F17200 Nicotine dependence, unspecified, uncomplicated: Secondary | ICD-10-CM

## 2012-07-14 DIAGNOSIS — J449 Chronic obstructive pulmonary disease, unspecified: Secondary | ICD-10-CM

## 2012-07-14 MED ORDER — PREDNISONE 10 MG PO TABS: ORAL_TABLET | ORAL | Status: DC

## 2012-07-14 MED ORDER — AMOXICILLIN-POT CLAVULANATE 875-125 MG PO TABS: 1.0000 | ORAL_TABLET | Freq: Two times a day (BID) | ORAL | Status: DC

## 2012-07-14 NOTE — Patient Instructions (Addendum)
Sample Symbicort 80    2 puffs and rinse mouth twice daily.  When this sample runs out, go back to your previous Symbicort 160. See if there is any difference between these for your breathing or skin-bruising.   Please call as needed  Scripts for prednisone taper and augmentin to hold for this winter

## 2012-07-14 NOTE — Progress Notes (Signed)
Patient ID: [name], female    DOB: 1945/10/16, 66 y.o.   MRN: [id number]  HPI [age] yoF smoker followed for COPD, tobacco dependence and cough. [employer]ity EMR reviewed for conversion to Epic.Has had extensive efforts at smoking counseling (husband died of tobacco induced lung cancer).  Comes today with increased cough since March 2, with concerns of relation to mold abatement being done in home. Refrigerator line leaked, later floor buckled and on removal, mold found beneath flooring. Thinks she may have coughed more since recent water leak onto kitchen floor. Denies fever, sore throat or obvious infection. Denies reflux. Has been more wheezey and short of breath Using her Advair and using Combivent 3 x/ day.   06/23/11- 64 yoF smoker followed for COPD, tobacco dependence and cough. She has had additional stress recently, son just had an MI and coronary stent. She has not tried to stop smoking despite counseling. She found Symbicort to be no better than Advair. Changing from a dry powder inhaler had no effect on her cough. Using rescue inhaler a little more. New complaint: Feels tight through the neck and unable to relax her neck and shoulders. A neck brace helps.  08/14/11 - 64 yoF smoker followed for COPD, tobacco dependence and cough. Acute visit- she is still smoking against advice. Has had flu vaccine. Now complains of acute illness for the past week. Chest tight, short of breath. Cough was productive but now is dry. Denies fever, chest pain blood or palpitation. 5 days ago and she accepted a prescription from us for Z-Pak, just finished today, but she says "it never helps". She is particularly concerned about recurrence of mucus plugging, since she understands that was the problem related to a sustained exacerbation with hospital stay in the past. She is using her rescue inhaler every 4 hours. CXR- 06/26/11- stable COPD, NAD.  10/01/11- 64 yoF smoker followed for COPD, tobacco dependence  and cough.  Increased SOB with walking or at rest-gotten worse since Christmas; prior to this was sick 08-14-2011-never regrouped from this. Wakes up at night gasping for air-has had to use rescue inhaler a few times when this happens. She finished prednisone and antibiotic that we had called in December 24 and has now been off of those for about 2 weeks. Stays short of breath as described. We discussed pending change of Combivent to the new delivery device. She is anxious about any change from familiar. Using Xanax about once daily. PFT- 07/07/11- severe obstructive airways disease with response to bronchodilator, hyperinflation, diffusion moderately reduced. FEV1 0.73/38%, FEV1/FVC 0.37, DLCO 45%. Loop configuration is emphysema.  10/21/11- 64 yoF smoker followed for COPD, tobacco dependence and cough. Post hospital-  Pt states increase SOB upon activity ,wheezing,denies any cough.     Daughter [name] is here Hospitalized in January 17 through 10/12/2011. She got progressively worse after last visit here and went to the emergency room. Treated for COPD exacerbation. Now she still is not quite back to her baseline but says she has not smoked since leaving the hospital. I picked on this and try to reinforce it with support. Especially short of breath with any exertion. She is wearing oxygen but once the office so she can continue to live alone and do her own yard work. October I could not, as we could get her back off. Lisinopril was changed to Norvasc and I discussed potential for lisinopril to be associated with dry cough and some people. She is tapering off of prednisone. Echocardiogram  reviewed and discussed-pulmonary artery systolic   12/03/11- 64 yoF smoker followed for COPD, tobacco dependence and cough. She  still needs oxygen sometimes during the day. We discussed oxygen delivery devices. Little cough or phlegm. Had Pneumovax.  01/25/12- 64 yoF smoker followed for COPD, tobacco dependence and  cough.  Daughter here Acute visit-SOB getting worse in the past week; Prednisone was called in last week(took last pill this am); had head congestion, ears popping last week-called in ABX/ augmentin-not working. Friday called and was told to go to ER-pt did not go. Coughing-productive-looks like slight yellow in color and thick-feels better once getting it up; having to wear O2 several times during the day.   04/12/12-  64 yoF smoker followed for COPD, tobacco dependence and cough.   States about the same. c/o difficulty breathing and using oxygen more often due to the hot weather. Also occassional cough and wheezing.  COPD assessment test (CAT) score 20/40 She has a jury summons which we discussed. She is oxygen dependent and cannot count on her portable oxygen to last the length of a jury obligation. She stays indoors to avoid the heat and humidity, getting others to mow her yard.   07/14/12- 64 yoF smoker followed for COPD, tobacco dependence and cough.   Good and bad days with breathing; no more than usual SOB. COPD assessment test (CAT) score 15/40 Has had flu shot. Weather changes are bothering her. Avoids using her oxygen in the house if she is comfortable but sleeps with it every night- 2L/[employer]. Using metered inhaler once daily. Joined "Silver sneakers" exercise program. Mentions burning occipital pain and I suggested she look into evaluation of her cervical spine. Complains of easy bruising without blood. On baby aspirin.  Review of Systems-see HPI Constitutional:   No-   weight loss, night sweats, fevers, chills, fatigue, lassitude. HEENT:   No-  headaches, difficulty swallowing, tooth/dental problems, sore throat,       Less sneezing, itching, ear ache, nasal congestion, post nasal drip,  CV:  No- chest pain, orthopnea, PND, swelling in lower extremities, anasarca, dizziness, palpitations.  Resp: Per HPI-     +shortness of breath with exertion or at rest.              +  productive  cough,  + non-productive cough,  No-  coughing up of blood.              No-  change in color of mucus.  + wheezing.   Skin: No-   rash or lesions. GI:  No-   heartburn, indigestion, abdominal pain, nausea, vomiting,  GU: MS:  + Neck/occipital burning pain   Neuro-  Psych:  No- change in mood or affect. + depression or anxiety.  No memory loss.   Objective:   Physical Exam BP 132/78  Pulse 89  Ht 5\' 2"  (1.575 m)  Wt 124 lb 3.2 oz (56.337 kg)  BMI 22.72 kg/m2  SpO2 94% General- Alert, Oriented, Affect-not anxious, Distress- none acute, conversational off oxygen Skin- +mild bruising Lymphadenopathy- none Head- atraumatic            Eyes- Gross vision intact, PERRLA, conjunctivae clear secretions            Ears- Hearing, canals-normal            Nose- Clear, no-Septal dev, mucus, polyps, erosion, perforation. +Sniffing.             Throat- Mallampati II , mucosa clear , drainage- none,  tonsils- atrophic.   Neck-  trachea midline, no stridor , thyroid nl, carotid no bruit. Chest - symmetrical excursion , unlabored           Heart/CV- RRR , no murmur , no gallop  , no rub, nl s1 s2                           - JVD- none , edema- none, stasis changes- none, varices- none           Lung- decreased but clear, unlabored, cough- mild , dullness-none, rub- none           Chest wall-  Abd- Br/ Gen/ Rectal- Not done, not indicated Extrem- cyanosis- none, clubbing, none, atrophy- none, strength- nl Neuro- grossly intact to observation

## 2012-07-25 ENCOUNTER — Encounter: Payer: Self-pay | Admitting: Internal Medicine

## 2012-07-25 NOTE — Assessment & Plan Note (Signed)
I tried to bring this topic up again. She is not trying to quit.

## 2012-07-25 NOTE — Assessment & Plan Note (Signed)
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 Currently doing a little better.

## 2012-08-03 ENCOUNTER — Telehealth: Payer: Self-pay | Admitting: Internal Medicine

## 2012-08-03 NOTE — Telephone Encounter (Signed)
I spoke with pt and she calling to let Dr. Maple Hudson know she started taking Augmentin and prednisone on Monday-she had on hand for the winter. She was having increase SOB, cough w/ clear phlem, wheezing, chest tx, and chills that had started on Friday. No f/n/v. Pt stated she is feeling little better today. She will call back if she worsens or does not improve. Will forward to Dr. Maple Hudson as an Lorain Childes.

## 2012-08-03 NOTE — Telephone Encounter (Signed)
Noted  

## 2012-09-16 ENCOUNTER — Telehealth: Payer: Self-pay | Admitting: Internal Medicine

## 2012-09-16 MED ORDER — DOXYCYCLINE HYCLATE 100 MG PO TABS: ORAL_TABLET | ORAL | Status: DC

## 2012-09-16 MED ORDER — PREDNISONE 10 MG PO TABS: ORAL_TABLET | ORAL | Status: DC

## 2012-09-16 NOTE — Telephone Encounter (Signed)
Per CY - Doxy 100mg  #8 2 tabs today then 1 daily until gone.

## 2012-09-16 NOTE — Telephone Encounter (Signed)
Per CY: pred 8 day taper also #20 o refill  Spoke with patient patient aware rxs have been sent into [facility] and nothing further needed at this time.

## 2012-09-16 NOTE — Telephone Encounter (Signed)
Spoke with patient, c/o prod cough with thick white mucus, feeling short winded, and felt feverish (took ibuprofen last night) and has had no sleep d/t continuous cough x3days. Patient states she has taken promethezine-codeine cough syrup with little improvement. Patient offered OV but states she would like to hold off and try an antibiotic and prednisone first.  Dr. Maple Hudson please advise, thank you. Nicolette Bang-- [facility]  Last OV:07/14/12  No Known Allergies

## 2012-09-23 ENCOUNTER — Ambulatory Visit (INDEPENDENT_AMBULATORY_CARE_PROVIDER_SITE_OTHER)
Admission: RE | Admit: 2012-09-23 | Discharge: 2012-09-23 | Disposition: A | Discharge status: Home / Self Care | Payer: Medicare Other | Source: Ambulatory Visit | Attending: Internal Medicine | Admitting: Internal Medicine

## 2012-09-23 ENCOUNTER — Encounter: Payer: Self-pay | Admitting: Internal Medicine

## 2012-09-23 ENCOUNTER — Other Ambulatory Visit (INDEPENDENT_AMBULATORY_CARE_PROVIDER_SITE_OTHER): Payer: Medicare Other

## 2012-09-23 ENCOUNTER — Ambulatory Visit (INDEPENDENT_AMBULATORY_CARE_PROVIDER_SITE_OTHER): Payer: Medicare Other | Admitting: Internal Medicine

## 2012-09-23 VITALS — BP 118/70 | HR 103 | Ht 62.0 in | Wt 127.0 lb

## 2012-09-23 DIAGNOSIS — J441 Chronic obstructive pulmonary disease with (acute) exacerbation: Secondary | ICD-10-CM

## 2012-09-23 DIAGNOSIS — J069 Acute upper respiratory infection, unspecified: Secondary | ICD-10-CM

## 2012-09-23 DIAGNOSIS — J310 Chronic rhinitis: Secondary | ICD-10-CM

## 2012-09-23 LAB — CBC WITH DIFFERENTIAL/PLATELET
Basophils Relative: 0.2 % (ref 0.0–3.0)
Eosinophils Absolute: 0 10*3/uL (ref 0.0–0.7)
Eosinophils Relative: 0.2 % (ref 0.0–5.0)
HCT: 42.2 % (ref 36.0–46.0)
Lymphs Abs: 1.9 10*3/uL (ref 0.7–4.0)
MCHC: 33.8 g/dL (ref 30.0–36.0)
MCV: 88 fl (ref 78.0–100.0)
Monocytes Absolute: 0.5 10*3/uL (ref 0.1–1.0)
Neutro Abs: 13.3 10*3/uL — ABNORMAL HIGH (ref 1.4–7.7)
Neutrophils Relative %: 84.5 % — ABNORMAL HIGH (ref 43.0–77.0)
RBC: 4.79 Mil/uL (ref 3.87–5.11)
WBC: 15.7 10*3/uL — ABNORMAL HIGH (ref 4.5–10.5)

## 2012-09-23 MED ORDER — METHYLPREDNISOLONE ACETATE 80 MG/ML IJ SUSP
80.0000 mg | Freq: Once | INTRAMUSCULAR | Status: AC
  Administered 2012-09-23: 80 mg via INTRAMUSCULAR

## 2012-09-23 MED ORDER — PHENYLEPHRINE HCL 1 % NA SOLN
3.0000 [drp] | Freq: Once | NASAL | Status: AC
  Administered 2012-09-23: 3 [drp] via NASAL

## 2012-09-23 NOTE — Progress Notes (Signed)
Patient ID: Kaitlyn MoralesFrances L Ibarra, female    DOB: 1945/10/16, 67 y.o.   MRN: [id number]  HPI [age] yoF smoker followed for COPD, tobacco dependence and cough. Centricity EMR reviewed for conversion to Epic.Has had extensive efforts at smoking counseling (husband died of tobacco induced lung cancer).  Comes today with increased cough since March 2, with concerns of relation to mold abatement being done in home. Refrigerator line leaked, later floor buckled and on removal, mold found beneath flooring. Thinks she may have coughed more since recent water leak onto kitchen floor. Denies fever, sore throat or obvious infection. Denies reflux. Has been more wheezey and short of breath Using her Advair and using Combivent 3 x/ day.   06/23/11- 64 yoF smoker followed for COPD, tobacco dependence and cough. She has had additional stress recently, son just had an MI and coronary stent. She has not tried to stop smoking despite counseling. She found Symbicort to be no better than Advair. Changing from a dry powder inhaler had no effect on her cough. Using rescue inhaler a little more. New complaint: Feels tight through the neck and unable to relax her neck and shoulders. A neck brace helps.  08/14/11 - 64 yoF smoker followed for COPD, tobacco dependence and cough. Acute visit- she is still smoking against advice. Has had flu vaccine. Now complains of acute illness for the past week. Chest tight, short of breath. Cough was productive but now is dry. Denies fever, chest pain blood or palpitation. 5 days ago and she accepted a prescription from us for Z-Pak, just finished today, but she says "it never helps". She is particularly concerned about recurrence of mucus plugging, since she understands that was the problem related to a sustained exacerbation with hospital stay in the past. She is using her rescue inhaler every 4 hours. CXR- 06/26/11- stable COPD, NAD.  10/01/11- 64 yoF smoker followed for COPD, tobacco dependence  and cough.  Increased SOB with walking or at rest-gotten worse since Christmas; prior to this was sick 08-14-2011-never regrouped from this. Wakes up at night gasping for air-has had to use rescue inhaler a few times when this happens. She finished prednisone and antibiotic that we had called in December 24 and has now been off of those for about 2 weeks. Stays short of breath as described. We discussed pending change of Combivent to the new delivery device. She is anxious about any change from familiar. Using Xanax about once daily. PFT- 07/07/11- severe obstructive airways disease with response to bronchodilator, hyperinflation, diffusion moderately reduced. FEV1 0.73/38%, FEV1/FVC 0.37, DLCO 45%. Loop configuration is emphysema.  10/21/11- 64 yoF smoker followed for COPD, tobacco dependence and cough. Post hospital-  Pt states increase SOB upon activity ,wheezing,denies any cough.     Daughter Kaitlyn RombergDrenda is here Hospitalized in January 17 through 10/12/2011. She got progressively worse after last visit here and went to the emergency room. Treated for COPD exacerbation. Now she still is not quite back to her baseline but says she has not smoked since leaving the hospital. I picked on this and try to reinforce it with support. Especially short of breath with any exertion. She is wearing oxygen but once the office so she can continue to live alone and do her own yard work. October I could not, as we could get her back off. Lisinopril was changed to Norvasc and I discussed potential for lisinopril to be associated with dry cough and some people. She is tapering off of prednisone. Echocardiogram  reviewed and discussed-pulmonary artery systolic   12/03/11- 64 yoF smoker followed for COPD, tobacco dependence and cough. She  still needs oxygen sometimes during the day. We discussed oxygen delivery devices. Little cough or phlegm. Had Pneumovax.  01/25/12- 64 yoF smoker followed for COPD, tobacco dependence and  cough.  Daughter here Acute visit-SOB getting worse in the past week; Prednisone was called in last week(took last pill this am); had head congestion, ears popping last week-called in ABX/ augmentin-not working. Friday called and was told to go to ER-pt did not go. Coughing-productive-looks like slight yellow in color and thick-feels better once getting it up; having to wear O2 several times during the day.   04/12/12-  64 yoF smoker followed for COPD, tobacco dependence and cough.   States about the same. c/o difficulty breathing and using oxygen more often due to the hot weather. Also occassional cough and wheezing.  COPD assessment test (CAT) score 20/40 She has a jury summons which we discussed. She is oxygen dependent and cannot count on her portable oxygen to last the length of a jury obligation. She stays indoors to avoid the heat and humidity, getting others to mow her yard.   07/14/12- 64 yoF smoker followed for COPD, tobacco dependence and cough.   Good and bad days with breathing; no more than usual SOB. COPD assessment test (CAT) score 15/40 Has had flu shot. Weather changes are bothering her. Avoids using her oxygen in the house if she is comfortable but sleeps with it every night- 2L/[employer]. Using metered inhaler once daily. Joined "Silver sneakers" exercise program. Mentions burning occipital pain and I suggested she look into evaluation of her cervical spine. Complains of easy bruising without blood. On baby aspirin.  09/23/12-66 yoF smoker followed for COPD, tobacco dependence and cough.  ACUTE VISIT: Increased SOB than normal; cough. (started feeling bad last week-was given Predn taper and Doxy RX) not feeling any better. Head gets "full" after coughing so much and able to small amount of mucus up. She has not felt well since December. In the last week we had called in prednisone and doxycycline which ended today without benefit. Short of breath, chest congestion, dry cough, full in  head, ears and nose. No fever now but feels weak. She only admits to smoking 2 or 3 cigarettes daily but that is still too much as explained to her. CT chest 10/08/11 IMPRESSION:  No evidence of pulmonary embolism or other acute findings. Stable  emphysematous lung disease.  Original Report Authenticated By: Reola Calkins, M.D.   Review of Systems-see HPI Constitutional:   No-   weight loss, night sweats, fevers, chills, fatigue, lassitude. HEENT:   No-  headaches, difficulty swallowing, tooth/dental problems, sore throat,       Less sneezing, itching, +ear ache, nasal congestion, post nasal drip,  CV:  No- chest pain, orthopnea, PND, swelling in lower extremities, anasarca, dizziness, palpitations.  Resp: Per HPI-     +shortness of breath with exertion or at rest.              +  productive cough,  + non-productive cough,  No-  coughing up of blood.              No-  change in color of mucus.  + wheezing.   Skin: No-   rash or lesions. GI:  No-   heartburn, indigestion, abdominal pain, nausea, vomiting,  GU: MS:  + Neck/occipital burning pain   Neuro-  Psych:  No- change in mood or affect. + depression or anxiety.  No memory loss.   Objective:   Physical Exam BP 118/70  Pulse 103  Ht 5\' 2"  (1.575 m)  Wt 127 lb (57.607 kg)  BMI 23.23 kg/m2  SpO2 92% General- Alert, Oriented, Affect-not anxious, Distress- none acute, conversational off oxygen Skin- +mild bruising Lymphadenopathy- none Head- atraumatic            Eyes- Gross vision intact, PERRLA, conjunctivae clear secretions            Ears- Hearing, canals-normal            Nose- + steady sniffing and turbinate edema, no-Septal dev, m polyps, erosion, perforation. +Sniffing.             Throat- Mallampati II , mucosa clear , drainage- none, tonsils- atrophic.   Neck-  trachea midline, no stridor , thyroid nl, carotid no bruit. Chest - symmetrical excursion , unlabored           Heart/CV- RRR , no murmur , no gallop  , no  rub, nl s1 s2                           - JVD- none , edema- none, stasis changes- none, varices- none           Lung- decreased but clear, unlabored, +cough- dry , dullness-none, rub- none           Chest wall-  Abd- Br/ Gen/ Rectal- Not done, not indicated Extrem- cyanosis- none, clubbing, none, atrophy- none, strength- nl Neuro- grossly intact to observation

## 2012-09-23 NOTE — Patient Instructions (Addendum)
Order- CXR  Acute exacerbation of COPD, URI               Lab- CBC  Run your oxygen on 3 L till you are feeling better  Neb neo nasal  Depo 80  Mucinex may help loosen the mucus  If you get worse, you may need to go to the ER this weekend

## 2012-10-04 DIAGNOSIS — J309 Allergic rhinitis, unspecified: Secondary | ICD-10-CM | POA: Insufficient documentation

## 2012-10-04 NOTE — Assessment & Plan Note (Signed)
This  remained after treatment for sinusitis following upper respiratory infection which has led to bronchitis Plan-nasal decongestant nebulizer, Depo-Medrol, Mucinex

## 2012-10-04 NOTE — Assessment & Plan Note (Signed)
Severe COPD with sustained exacerbation. We will check chest x-ray but I doubt an exotic explanation. Plan- nebulizer Depo-Medrol Mucinex, CBC, chest x-ray

## 2012-10-26 ENCOUNTER — Other Ambulatory Visit: Payer: Self-pay | Admitting: Internal Medicine

## 2012-11-21 ENCOUNTER — Telehealth: Payer: Self-pay | Admitting: Internal Medicine

## 2012-11-21 NOTE — Telephone Encounter (Signed)
Pt is aware of CY's recommendation.

## 2012-11-21 NOTE — Telephone Encounter (Signed)
Per CY: take 1/2 tablet x 2 days the stop.

## 2012-11-21 NOTE — Telephone Encounter (Signed)
Called and spoke with pt and she stated that her family doctor wanted her to stop her aspirin.  Pt has a place on her leg and she has spots on her arms and they think that this is due to the aspirin.  She has appt next week with PVD doctor.  Pt stated that she did not feel comfortable just stopping the aspirin since she has been on this for years.  Pt is requesting recs from CY.  Please advise. Thanks  Last ov--10/21/2012 Next ov--01/12/2013  No Known Allergies

## 2013-01-12 ENCOUNTER — Ambulatory Visit (INDEPENDENT_AMBULATORY_CARE_PROVIDER_SITE_OTHER): Payer: Medicare Other | Admitting: Internal Medicine

## 2013-01-12 ENCOUNTER — Encounter: Payer: Self-pay | Admitting: Internal Medicine

## 2013-01-12 VITALS — BP 122/78 | HR 95 | Ht 60.5 in | Wt 122.2 lb

## 2013-01-12 DIAGNOSIS — J439 Emphysema, unspecified: Secondary | ICD-10-CM

## 2013-01-12 DIAGNOSIS — J438 Other emphysema: Secondary | ICD-10-CM

## 2013-01-12 DIAGNOSIS — J309 Allergic rhinitis, unspecified: Secondary | ICD-10-CM

## 2013-01-12 DIAGNOSIS — H1010 Acute atopic conjunctivitis, unspecified eye: Secondary | ICD-10-CM

## 2013-01-12 DIAGNOSIS — F17200 Nicotine dependence, unspecified, uncomplicated: Secondary | ICD-10-CM

## 2013-01-12 MED ORDER — BUDESONIDE-FORMOTEROL FUMARATE 160-4.5 MCG/ACT IN AERO: 2.0000 | INHALATION_SPRAY | Freq: Two times a day (BID) | RESPIRATORY_TRACT | Status: DC

## 2013-01-12 NOTE — Progress Notes (Signed)
Patient ID: Kaitlyn MoralesFrances L Ibarra, female    DOB: 1945/10/16, 67 y.o.   MRN: [id number]  HPI [age] yoF smoker followed for COPD, tobacco dependence and cough. Centricity EMR reviewed for conversion to Epic.Has had extensive efforts at smoking counseling (husband died of tobacco induced lung cancer).  Comes today with increased cough since March 2, with concerns of relation to mold abatement being done in home. Refrigerator line leaked, later floor buckled and on removal, mold found beneath flooring. Thinks she may have coughed more since recent water leak onto kitchen floor. Denies fever, sore throat or obvious infection. Denies reflux. Has been more wheezey and short of breath Using her Advair and using Combivent 3 x/ day.   06/23/11- 64 yoF smoker followed for COPD, tobacco dependence and cough. She has had additional stress recently, son just had an MI and coronary stent. She has not tried to stop smoking despite counseling. She found Symbicort to be no better than Advair. Changing from a dry powder inhaler had no effect on her cough. Using rescue inhaler a little more. New complaint: Feels tight through the neck and unable to relax her neck and shoulders. A neck brace helps.  08/14/11 - 64 yoF smoker followed for COPD, tobacco dependence and cough. Acute visit- she is still smoking against advice. Has had flu vaccine. Now complains of acute illness for the past week. Chest tight, short of breath. Cough was productive but now is dry. Denies fever, chest pain blood or palpitation. 5 days ago and she accepted a prescription from us for Z-Pak, just finished today, but she says "it never helps". She is particularly concerned about recurrence of mucus plugging, since she understands that was the problem related to a sustained exacerbation with hospital stay in the past. She is using her rescue inhaler every 4 hours. CXR- 06/26/11- stable COPD, NAD.  10/01/11- 64 yoF smoker followed for COPD, tobacco dependence  and cough.  Increased SOB with walking or at rest-gotten worse since Christmas; prior to this was sick 08-14-2011-never regrouped from this. Wakes up at night gasping for air-has had to use rescue inhaler a few times when this happens. She finished prednisone and antibiotic that we had called in December 24 and has now been off of those for about 2 weeks. Stays short of breath as described. We discussed pending change of Combivent to the new delivery device. She is anxious about any change from familiar. Using Xanax about once daily. PFT- 07/07/11- severe obstructive airways disease with response to bronchodilator, hyperinflation, diffusion moderately reduced. FEV1 0.73/38%, FEV1/FVC 0.37, DLCO 45%. Loop configuration is emphysema.  10/21/11- 64 yoF smoker followed for COPD, tobacco dependence and cough. Post hospital-  Pt states increase SOB upon activity ,wheezing,denies any cough.     Daughter [name] is here Hospitalized in January 17 through 10/12/2011. She got progressively worse after last visit here and went to the emergency room. Treated for COPD exacerbation. Now she still is not quite back to her baseline but says she has not smoked since leaving the hospital. I picked on this and try to reinforce it with support. Especially short of breath with any exertion. She is wearing oxygen but once the office so she can continue to live alone and do her own yard work. October I could not, as we could get her back off. Lisinopril was changed to Norvasc and I discussed potential for lisinopril to be associated with dry cough and some people. She is tapering off of prednisone. Echocardiogram  reviewed and discussed-pulmonary artery systolic   12/03/11- 64 yoF smoker followed for COPD, tobacco dependence and cough. She  still needs oxygen sometimes during the day. We discussed oxygen delivery devices. Little cough or phlegm. Had Pneumovax.  01/25/12- 64 yoF smoker followed for COPD, tobacco dependence and  cough.  Daughter here Acute visit-SOB getting worse in the past week; Prednisone was called in last week(took last pill this am); had head congestion, ears popping last week-called in ABX/ augmentin-not working. Friday called and was told to go to ER-pt did not go. Coughing-productive-looks like slight yellow in color and thick-feels better once getting it up; having to wear O2 several times during the day.   04/12/12-  64 yoF smoker followed for COPD, tobacco dependence and cough.   States about the same. c/o difficulty breathing and using oxygen more often due to the hot weather. Also occassional cough and wheezing.  COPD assessment test (CAT) score 20/40 She has a jury summons which we discussed. She is oxygen dependent and cannot count on her portable oxygen to last the length of a jury obligation. She stays indoors to avoid the heat and humidity, getting others to mow her yard.   07/14/12- 64 yoF smoker followed for COPD, tobacco dependence and cough.   Good and bad days with breathing; no more than usual SOB. COPD assessment test (CAT) score 15/40 Has had flu shot. Weather changes are bothering her. Avoids using her oxygen in the house if she is comfortable but sleeps with it every night- 2L/[employer]. Using metered inhaler once daily. Joined "Silver sneakers" exercise program. Mentions burning occipital pain and I suggested she look into evaluation of her cervical spine. Complains of easy bruising without blood. On baby aspirin.  09/23/12-66 yoF smoker followed for COPD, tobacco dependence and cough.  ACUTE VISIT: Increased SOB than normal; cough. (started feeling bad last week-was given Predn taper and Doxy RX) not feeling any better. Head gets "full" after coughing so much and able to small amount of mucus up. She has not felt well since December. In the last week we had called in prednisone and doxycycline which ended today without benefit. Short of breath, chest congestion, dry cough, full in  head, ears and nose. No fever now but feels weak. She only admits to smoking 2 or 3 cigarettes daily but that is still  CT chest 10/08/11 IMPRESSION:  No evidence of pulmonary embolism or other acute findings. Stable  emphysematous lung disease.  Original Report Authenticated By: Reola Calkins, M.D.   01/12/13- 19 yoF smoker followed for COPD, tobacco dependence and cough.  FOLLOWS FOR: feeling better since 09/2012 visit; Denies any wheezing or SOB. States allergies are flaring up-mowed the yard on Tuesday. Eyes burn and itch, blamed on pollen No real change in her chest or shortness of breath with exertion. Felt much better after Depo-Medrol last visit. Using oxygen only to sleep. Using Combivent inhaler one time daily.and Symbicort twice daily. Exercises at gym 4 days per week. Complains that calves tender and ache. [facility] did peripheral vascular evaluation, negative. Echocardiogram 10/06/2011-grade 2 diastolic dysfunction CXR 09/23/12 IMPRESSION:  COPD/chronic changes. No active cardiopulmonary disease.  Original Report Authenticated By: Charlett Nose, M.D.   Review of Systems-see HPI Constitutional:   No-   weight loss, night sweats, fevers, chills, fatigue, lassitude. HEENT:   No-  headaches, difficulty swallowing, tooth/dental problems, sore throat,       + sneezing, itching, no-ear ache, +nasal congestion, post nasal drip,  CV:  No- chest pain, orthopnea, PND, swelling in lower extremities, anasarca, dizziness, palpitations.  Resp: Per HPI-     +shortness of breath with exertion or at rest.              +  productive cough,  + non-productive cough,  No-  coughing up of blood.              No-  change in color of mucus.  + wheezing.   Skin: No-   rash or lesions. GI:  No-   heartburn, indigestion, abdominal pain, nausea, vomiting,  GU: MS:  + Neck/occipital burning pain   Neuro-  Psych:  No- change in mood or affect. + depression or anxiety.  No memory loss.   Objective:   Physical  Exam  General- Alert, Oriented, Affect-not anxious, Distress- none acute, conversational off oxygen Skin- +mild bruising Lymphadenopathy- none Head- atraumatic            Eyes- Gross vision intact, PERRLA, conjunctivae clear secretions            Ears- Hearing, canals-normal            Nose- + steady sniffing and turbinate edema, no-Septal dev, m polyps, erosion, perforation. +Sniffing.             Throat- Mallampati II , mucosa clear , drainage- none, tonsils- atrophic.   Neck-  trachea midline, no stridor , thyroid nl, carotid no bruit. Chest - symmetrical excursion , unlabored           Heart/CV- RRR , no murmur , no gallop  , no rub, nl s1 s2                           - JVD- none , edema+ sock-line, stasis changes- none, varices- none           Lung- decreased but clear, unlabored, +cough- dry , dullness-none, rub- none           Chest wall-  Abd- Br/ Gen/ Rectal- Not done, not indicated Extrem- cyanosis- none, clubbing, none, atrophy- none, strength- nl. Homan's Neg. Neuro- grossly intact to observation

## 2013-01-12 NOTE — Patient Instructions (Addendum)
Pay attention as you elevate your legs or wear support hose, to see if it helps the ache/ tenderness in your legs  Please call as needed  Your heart muscle is a little stiff as it relaxes to fill (diastolic dysfunction). I will pass the report from your echocardiogram to your primary doctor.

## 2013-01-19 NOTE — Assessment & Plan Note (Signed)
Controlled at present time. Peripheral edema may reflect developing cor pulmonale.

## 2013-01-19 NOTE — Assessment & Plan Note (Signed)
I have not been able to motivate her to completely stop smoking. We discussed at each visit.

## 2013-01-19 NOTE — Assessment & Plan Note (Signed)
Seasonal exacerbation. Manage with antihistamines and consider steroid nasal spray.

## 2013-01-20 ENCOUNTER — Other Ambulatory Visit: Payer: Self-pay | Admitting: Internal Medicine

## 2013-01-20 NOTE — Telephone Encounter (Signed)
Please advise if okay to refill, Thanks.

## 2013-01-22 NOTE — Telephone Encounter (Signed)
Ok to refill 

## 2013-01-25 ENCOUNTER — Telehealth: Payer: Self-pay | Admitting: Internal Medicine

## 2013-01-25 NOTE — Telephone Encounter (Signed)
Pt alprazolam last refilled 06/02/12 #60 x 5 refills. Last OV 01/12/13 Pending 07/13/13 Please advise Dr. Maple Hudson thanks

## 2013-01-25 NOTE — Telephone Encounter (Signed)
Per CY: ok to refill  Rx telephoned to [facility] - pharmacist Heather Alprazolam 0.5mg  #60 1poTIDprn x5 refills  Pt is aware Nothing further needed; will sign off

## 2013-01-31 ENCOUNTER — Telehealth: Payer: Self-pay | Admitting: Internal Medicine

## 2013-01-31 MED ORDER — PREDNISONE 10 MG PO TABS: ORAL_TABLET | ORAL | Status: DC

## 2013-01-31 NOTE — Telephone Encounter (Signed)
Per CDY:  Ok to give prednisone 10 mg #20 take 4 x 2 days, 3 x 2 days, 2 x 2 days, 1 x 2 days then stop.  No refills.  -------  Called, spoke with pt.  Informed her of above per CDY.  She is aware rx sent to [facility] and to call back if symptoms do not improve or worsen and to seek emergency care if needed.  She verbalized understanding of instructions and voiced no further questions or concerns at this time.

## 2013-01-31 NOTE — Telephone Encounter (Signed)
Patient states she has been having a diff time w her breathing Having a prod cough w clear thick mucus Feels Very short winded x4 days Requesting a Rx for prednisone or patient also states that she received a depo shot "one time" and that seemed to work very well\ Dr. Maple Hudson please advise, thank you!  Pharmacy: [facility]  No Known Allergies  Last OV: 01/12/13 w 6 month f/u

## 2013-03-20 ENCOUNTER — Ambulatory Visit (INDEPENDENT_AMBULATORY_CARE_PROVIDER_SITE_OTHER): Payer: Medicare Other | Admitting: Internal Medicine

## 2013-03-20 ENCOUNTER — Telehealth: Payer: Self-pay | Admitting: Internal Medicine

## 2013-03-20 ENCOUNTER — Encounter: Payer: Self-pay | Admitting: Internal Medicine

## 2013-03-20 VITALS — BP 122/74 | HR 117 | Ht 60.5 in | Wt 120.0 lb

## 2013-03-20 DIAGNOSIS — F17200 Nicotine dependence, unspecified, uncomplicated: Secondary | ICD-10-CM

## 2013-03-20 DIAGNOSIS — J441 Chronic obstructive pulmonary disease with (acute) exacerbation: Secondary | ICD-10-CM

## 2013-03-20 MED ORDER — PREDNISONE 10 MG PO TABS: ORAL_TABLET | ORAL | Status: DC

## 2013-03-20 MED ORDER — METHYLPREDNISOLONE ACETATE 80 MG/ML IJ SUSP
80.0000 mg | Freq: Once | INTRAMUSCULAR | Status: AC
  Administered 2013-03-20: 80 mg via INTRAMUSCULAR

## 2013-03-20 MED ORDER — LEVALBUTEROL HCL 0.63 MG/3ML IN NEBU
0.6300 mg | INHALATION_SOLUTION | Freq: Once | RESPIRATORY_TRACT | Status: AC
  Administered 2013-03-20: 0.63 mg via RESPIRATORY_TRACT

## 2013-03-20 MED ORDER — IPRATROPIUM-ALBUTEROL 0.5-2.5 (3) MG/3ML IN SOLN: 3.0000 mL | Freq: Two times a day (BID) | RESPIRATORY_TRACT | Status: DC

## 2013-03-20 NOTE — Assessment & Plan Note (Signed)
Reinforced smoking cessation message and support

## 2013-03-20 NOTE — Patient Instructions (Addendum)
Neb xop 0.63  Depo 80  Script for prednisone taper to take if needed

## 2013-03-20 NOTE — Telephone Encounter (Signed)
Pt c/o being very SOB (pt was SOB talking on phone). She stated it has been worse x the weekend. She has a dry cough Denies any wheezing, no chest tx. She has been using her neb and symbicort. Pt is requesting to be seen today. Please advise Dr. Maple Hudson thanks Last OV 01/12/13 Pending 07/13/13 No Known Allergies

## 2013-03-20 NOTE — Progress Notes (Signed)
Patient ID: [name], female    DOB: 1945/10/16, 67 y.o.   MRN: [id number]  HPI [age] yoF smoker followed for COPD, tobacco dependence and cough. Centricity EMR reviewed for conversion to Epic.Has had extensive efforts at smoking counseling (husband died of tobacco induced lung cancer).  Comes today with increased cough since March 2, with concerns of relation to mold abatement being done in home. Refrigerator line leaked, later floor buckled and on removal, mold found beneath flooring. Thinks she may have coughed more since recent water leak onto kitchen floor. Denies fever, sore throat or obvious infection. Denies reflux. Has been more wheezey and short of breath Using her Advair and using Combivent 3 x/ day.   06/23/11- 67 yoF smoker followed for COPD, tobacco dependence and cough. She has had additional stress recently, son just had an MI and coronary stent. She has not tried to stop smoking despite counseling. She found Symbicort to be no better than Advair. Changing from a dry powder inhaler had no effect on her cough. Using rescue inhaler a little more. New complaint: Feels tight through the neck and unable to relax her neck and shoulders. A neck brace helps.  08/14/11 - 67 yoF smoker followed for COPD, tobacco dependence and cough. Acute visit- she is still smoking against advice. Has had flu vaccine. Now complains of acute illness for the past week. Chest tight, short of breath. Cough was productive but now is dry. Denies fever, chest pain blood or palpitation. 5 days ago and she accepted a prescription from us for Z-Pak, just finished today, but she says "it never helps". She is particularly concerned about recurrence of mucus plugging, since she understands that was the problem related to a sustained exacerbation with hospital stay in the past. She is using her rescue inhaler every 4 hours. CXR- 06/26/11- stable COPD, NAD.  10/01/11- 67 yoF smoker followed for COPD, tobacco dependence  and cough.  Increased SOB with walking or at rest-gotten worse since Christmas; prior to this was sick 08-14-2011-never regrouped from this. Wakes up at night gasping for air-has had to use rescue inhaler a few times when this happens. She finished prednisone and antibiotic that we had called in December 24 and has now been off of those for about 2 weeks. Stays short of breath as described. We discussed pending change of Combivent to the new delivery device. She is anxious about any change from familiar. Using Xanax about once daily. PFT- 07/07/11- severe obstructive airways disease with response to bronchodilator, hyperinflation, diffusion moderately reduced. FEV1 0.73/38%, FEV1/FVC 0.37, DLCO 45%. Loop configuration is emphysema.  10/21/11- 67 yoF smoker followed for COPD, tobacco dependence and cough. Post hospital-  Pt states increase SOB upon activity ,wheezing,denies any cough.     Daughter [name] is here Hospitalized in January 17 through 10/12/2011. She got progressively worse after last visit here and went to the emergency room. Treated for COPD exacerbation. Now she still is not quite back to her baseline but says she has not smoked since leaving the hospital. I picked on this and try to reinforce it with support. Especially short of breath with any exertion. She is wearing oxygen but once the office so she can continue to live alone and do her own yard work. October I could not, as we could get her back off. Lisinopril was changed to Norvasc and I discussed potential for lisinopril to be associated with dry cough and some people. She is tapering off of prednisone. Echocardiogram  reviewed and discussed-pulmonary artery systolic   12/03/11- 67 yoF smoker followed for COPD, tobacco dependence and cough. She  still needs oxygen sometimes during the day. We discussed oxygen delivery devices. Little cough or phlegm. Had Pneumovax.  01/25/12- 67 yoF smoker followed for COPD, tobacco dependence and  cough.  Daughter here Acute visit-SOB getting worse in the past week; Prednisone was called in last week(took last pill this am); had head congestion, ears popping last week-called in ABX/ augmentin-not working. Friday called and was told to go to ER-pt did not go. Coughing-productive-looks like slight yellow in color and thick-feels better once getting it up; having to wear O2 several times during the day.   04/12/12-  67 yoF smoker followed for COPD, tobacco dependence and cough.   States about the same. c/o difficulty breathing and using oxygen more often due to the hot weather. Also occassional cough and wheezing.  COPD assessment test (CAT) score 20/40 She has a jury summons which we discussed. She is oxygen dependent and cannot count on her portable oxygen to last the length of a jury obligation. She stays indoors to avoid the heat and humidity, getting others to mow her yard.   07/14/12- 67 yoF smoker followed for COPD, tobacco dependence and cough.   Good and bad days with breathing; no more than usual SOB. COPD assessment test (CAT) score 15/40 Has had flu shot. Weather changes are bothering her. Avoids using her oxygen in the house if she is comfortable but sleeps with it every night- 2L/[employer]. Using metered inhaler once daily. Joined "Silver sneakers" exercise program. Mentions burning occipital pain and I suggested she look into evaluation of her cervical spine. Complains of easy bruising without blood. On baby aspirin.  09/23/12-67 yoF smoker followed for COPD, tobacco dependence and cough.  ACUTE VISIT: Increased SOB than normal; cough. (started feeling bad last week-was given Predn taper and Doxy RX) not feeling any better. Head gets "full" after coughing so much and able to small amount of mucus up. She has not felt well since December. In the last week we had called in prednisone and doxycycline which ended today without benefit. Short of breath, chest congestion, dry cough, full in  head, ears and nose. No fever now but feels weak. She only admits to smoking 2 or 3 cigarettes daily but that is still  CT chest 10/08/11 IMPRESSION:  No evidence of pulmonary embolism or other acute findings. Stable  emphysematous lung disease.  Original Report Authenticated By: Reola CalkinsGLENN T. YAMAGATA, M.D.   01/12/13- [age] yoF smoker followed for COPD, tobacco dependence and cough.  FOLLOWS FOR: feeling better since 09/2012 visit; Denies any wheezing or SOB. States allergies are flaring up-mowed the yard on Tuesday. Eyes burn and itch, blamed on pollen No real change in her chest or shortness of breath with exertion. Felt much better after Depo-Medrol last visit. Using oxygen only to sleep. Using Combivent inhaler one time daily.and Symbicort twice daily. Exercises at gym 4 days per week. Complains that calves tender and ache. [facility] did peripheral vascular evaluation, negative. Echocardiogram 10/06/2011-grade 2 diastolic dysfunction CXR 09/23/12 IMPRESSION:  COPD/chronic changes. No active cardiopulmonary disease.  Original Report Authenticated By: Charlett NoseKevin Dover, M.D.  03/20/13-  [age] yoF smoker followed for COPD, tobacco dependence and cough ACUTE VISIT: PT states she is unable to breath that she cant even get in the shower mowed yard-started having increased SOB since then-got worse over the weeked. Continues to use O2, nebulizer, and inhalers-slight increase in use.  Still smoking against repeated counseling Ok at baseline until riding mower dusty 6 days ago. Progressive SOB through week, clear mucus, scant. Needs to stay on O2 more of time. Using neb 1-3x/ day.  Review of Systems-see HPI Constitutional:   No-   weight loss, night sweats, fevers, chills, fatigue, lassitude. HEENT:   No-  headaches, difficulty swallowing, tooth/dental problems, sore throat,       + sneezing, itching, no-ear ache, +nasal congestion, post nasal drip,  CV:  No- chest pain, orthopnea, PND, swelling in lower extremities,  anasarca, dizziness, palpitations.  Resp: Per HPI-     +shortness of breath with exertion or at rest.              +  productive cough,  non-productive cough,  No-  coughing up of blood.              No-  change in color of mucus.  + wheezing.   Skin: No-   rash or lesions. GI:  No-   heartburn, indigestion, abdominal pain, nausea, vomiting,  GU: MS:  No acute arthralgias Neuro-  Psych:  No- change in mood or affect. depression or anxiety.  No memory loss.   Objective:   Physical Exam  General- Alert, Oriented, Affect-not anxious, Distress- none acute, conversational on O2, wheelchair Skin- no rash Lymphadenopathy- none Head- atraumatic            Eyes- Gross vision intact, PERRLA, conjunctivae clear secretions            Ears- Hearing, canals-normal            Nose- + steady sniffing and turbinate edema, no-Septal dev, m polyps, erosion, perforation. .             Throat- Mallampati II , mucosa clear , drainage- none, tonsils- atrophic.   Neck-  trachea midline, no stridor , thyroid nl, carotid no bruit. Chest - symmetrical excursion , unlabored           Heart/CV- RRR , no murmur , no gallop  , no rub, nl s1 s2                           - JVD- none , edema+ sock-line, stasis changes- none, varices- none           Lung- distant bilateral wheeze, unlabored, +cough- dry , dullness-none, rub- none           Chest wall-  Abd- Br/ Gen/ Rectal- Not done, not indicated Extrem- cyanosis- none, clubbing, none, atrophy- none, strength- nl. . Neuro- grossly intact to observation

## 2013-03-20 NOTE — Telephone Encounter (Signed)
Please have patient come in today at 2:30pm to be seen by CY and be treated accordingly.

## 2013-03-20 NOTE — Telephone Encounter (Signed)
I spoke with pt and is scheduled to come in at that time. Nothing further was needed

## 2013-03-20 NOTE — Assessment & Plan Note (Signed)
Severe COPD with acute exacerbation after dust exposure while mowing. Doubt infection. Plan- neb xopenex, Depomedrol, refill neb solution. Smoking cessation education again.

## 2013-03-22 ENCOUNTER — Telehealth: Payer: Self-pay | Admitting: Internal Medicine

## 2013-03-22 MED ORDER — AMOXICILLIN-POT CLAVULANATE 500-125 MG PO TABS: 1.0000 | ORAL_TABLET | Freq: Two times a day (BID) | ORAL | Status: DC

## 2013-03-22 NOTE — Telephone Encounter (Signed)
Last ov w/ CY 6.30.14: Patient Instructions    Neb xop 0.63  Depo 80  Script for prednisone taper to take if needed   Called spoke with patient who c/o wheezing, increased SOB, chest congestion, some cough with minimal clear mucus, chest tightness, chest feels "so scratchy inside."  Denies chills/sweats.  Reports doesn't feel much better since Monday w/ the depo.  Daughter is to pick up the pred taper today.  Pt believes she needs an abx.  Advised pt that with her symptoms she needs to go ahead and start the prednisone, but pt unable to get this rx herself and will have to wait for her daughter.  Dr Maple Hudson please advise, thank you. [facility] - verified

## 2013-03-22 NOTE — Telephone Encounter (Signed)
Per CDY:  Offer pt to take augmentin 500 mg #14 1 bid no refills.  ----  Called, spoke with pt.  Informed her of CDY's recs.  She verbalized understanding and aware rx sent to [facility].  She is to call back if symptoms do not improve or worsens and is to seek emergency care if needed. She verbalized understanding of recs and is in agreement with this plan. Nothing further needed at this time.

## 2013-05-02 ENCOUNTER — Other Ambulatory Visit: Payer: Self-pay | Admitting: Internal Medicine

## 2013-07-13 ENCOUNTER — Encounter: Payer: Self-pay | Admitting: Internal Medicine

## 2013-07-13 ENCOUNTER — Ambulatory Visit (INDEPENDENT_AMBULATORY_CARE_PROVIDER_SITE_OTHER): Payer: Medicare Other | Admitting: Internal Medicine

## 2013-07-13 VITALS — BP 142/62 | HR 97 | Ht 60.0 in | Wt 122.0 lb

## 2013-07-13 DIAGNOSIS — F17200 Nicotine dependence, unspecified, uncomplicated: Secondary | ICD-10-CM

## 2013-07-13 DIAGNOSIS — J441 Chronic obstructive pulmonary disease with (acute) exacerbation: Secondary | ICD-10-CM

## 2013-07-13 MED ORDER — METHYLPREDNISOLONE ACETATE 80 MG/ML IJ SUSP
80.0000 mg | Freq: Once | INTRAMUSCULAR | Status: AC
  Administered 2013-07-13: 80 mg via INTRAMUSCULAR

## 2013-07-13 MED ORDER — PREDNISONE 10 MG PO TABS: ORAL_TABLET | ORAL | Status: DC

## 2013-07-13 MED ORDER — FLUTICASONE FUROATE-VILANTEROL 100-25 MCG/INH IN AEPB: 1.0000 | INHALATION_SPRAY | Freq: Every day | RESPIRATORY_TRACT | Status: DC

## 2013-07-13 MED ORDER — DOXYCYCLINE HYCLATE 100 MG PO TABS: ORAL_TABLET | ORAL | Status: DC

## 2013-07-13 MED ORDER — LEVALBUTEROL HCL 0.63 MG/3ML IN NEBU
0.6300 mg | INHALATION_SOLUTION | Freq: Once | RESPIRATORY_TRACT | Status: AC
  Administered 2013-07-13: 0.63 mg via RESPIRATORY_TRACT

## 2013-07-13 NOTE — Progress Notes (Signed)
Patient ID: [name], female    DOB: 1945/10/16, 67 y.o.   MRN: [id number]  HPI [age] yoF smoker followed for COPD, tobacco dependence and cough. Centricity EMR reviewed for conversion to Epic.Has had extensive efforts at smoking counseling (husband died of tobacco induced lung cancer).  Comes today with increased cough since March 2, with concerns of relation to mold abatement being done in home. Refrigerator line leaked, later floor buckled and on removal, mold found beneath flooring. Thinks she may have coughed more since recent water leak onto kitchen floor. Denies fever, sore throat or obvious infection. Denies reflux. Has been more wheezey and short of breath Using her Advair and using Combivent 3 x/ day.   06/23/11- 67 yoF smoker followed for COPD, tobacco dependence and cough. She has had additional stress recently, son just had an MI and coronary stent. She has not tried to stop smoking despite counseling. She found Symbicort to be no better than Advair. Changing from a dry powder inhaler had no effect on her cough. Using rescue inhaler a little more. New complaint: Feels tight through the neck and unable to relax her neck and shoulders. A neck brace helps.  08/14/11 - 67 yoF smoker followed for COPD, tobacco dependence and cough. Acute visit- she is still smoking against advice. Has had flu vaccine. Now complains of acute illness for the past week. Chest tight, short of breath. Cough was productive but now is dry. Denies fever, chest pain blood or palpitation. 5 days ago and she accepted a prescription from us for Z-Pak, just finished today, but she says "it never helps". She is particularly concerned about recurrence of mucus plugging, since she understands that was the problem related to a sustained exacerbation with hospital stay in the past. She is using her rescue inhaler every 4 hours. CXR- 06/26/11- stable COPD, NAD.  10/01/11- 67 yoF smoker followed for COPD, tobacco dependence  and cough.  Increased SOB with walking or at rest-gotten worse since Christmas; prior to this was sick 08-14-2011-never regrouped from this. Wakes up at night gasping for air-has had to use rescue inhaler a few times when this happens. She finished prednisone and antibiotic that we had called in December 24 and has now been off of those for about 2 weeks. Stays short of breath as described. We discussed pending change of Combivent to the new delivery device. She is anxious about any change from familiar. Using Xanax about once daily. PFT- 07/07/11- severe obstructive airways disease with response to bronchodilator, hyperinflation, diffusion moderately reduced. FEV1 0.73/38%, FEV1/FVC 0.37, DLCO 45%. Loop configuration is emphysema.  10/21/11- 67 yoF smoker followed for COPD, tobacco dependence and cough. Post hospital-  Pt states increase SOB upon activity ,wheezing,denies any cough.     Daughter [name] is here Hospitalized in January 17 through 10/12/2011. She got progressively worse after last visit here and went to the emergency room. Treated for COPD exacerbation. Now she still is not quite back to her baseline but says she has not smoked since leaving the hospital. I picked on this and try to reinforce it with support. Especially short of breath with any exertion. She is wearing oxygen but once the office so she can continue to live alone and do her own yard work. October I could not, as we could get her back off. Lisinopril was changed to Norvasc and I discussed potential for lisinopril to be associated with dry cough and some people. She is tapering off of prednisone. Echocardiogram  reviewed and discussed-pulmonary artery systolic   12/03/11- 67 yoF smoker followed for COPD, tobacco dependence and cough. She  still needs oxygen sometimes during the day. We discussed oxygen delivery devices. Little cough or phlegm. Had Pneumovax.  01/25/12- 67 yoF smoker followed for COPD, tobacco dependence and  cough.  Daughter here Acute visit-SOB getting worse in the past week; Prednisone was called in last week(took last pill this am); had head congestion, ears popping last week-called in ABX/ augmentin-not working. Friday called and was told to go to ER-pt did not go. Coughing-productive-looks like slight yellow in color and thick-feels better once getting it up; having to wear O2 several times during the day.   04/12/12-  67 yoF smoker followed for COPD, tobacco dependence and cough.   States about the same. c/o difficulty breathing and using oxygen more often due to the hot weather. Also occassional cough and wheezing.  COPD assessment test (CAT) score 20/40 She has a jury summons which we discussed. She is oxygen dependent and cannot count on her portable oxygen to last the length of a jury obligation. She stays indoors to avoid the heat and humidity, getting others to mow her yard.   07/14/12- 67 yoF smoker followed for COPD, tobacco dependence and cough.   Good and bad days with breathing; no more than usual SOB. COPD assessment test (CAT) score 15/40 Has had flu shot. Weather changes are bothering her. Avoids using her oxygen in the house if she is comfortable but sleeps with it every night- 2L/[employer]. Using metered inhaler once daily. Joined "[employer]" exercise program. Mentions burning occipital pain and I suggested she look into evaluation of her cervical spine. Complains of easy bruising without blood. On baby aspirin.  09/23/12-67 yoF smoker followed for COPD, tobacco dependence and cough.  ACUTE VISIT: Increased SOB than normal; cough. (started feeling bad last week-was given Predn taper and Doxy RX) not feeling any better. Head gets "full" after coughing so much and able to small amount of mucus up. She has not felt well since December. In the last week we had called in prednisone and doxycycline which ended today without benefit. Short of breath, chest congestion, dry cough, full in  head, ears and nose. No fever now but feels weak. She only admits to smoking 2 or 3 cigarettes daily but that is still  CT chest 10/08/11 IMPRESSION:  No evidence of pulmonary embolism or other acute findings. Stable  emphysematous lung disease.  Original Report Authenticated By: Reola CalkinsGLENN T. YAMAGATA, M.D.   01/12/13- [age] yoF smoker followed for COPD, tobacco dependence and cough.  FOLLOWS FOR: feeling better since 09/2012 visit; Denies any wheezing or SOB. States allergies are flaring up-mowed the yard on Tuesday. Eyes burn and itch, blamed on pollen No real change in her chest or shortness of breath with exertion. Felt much better after Depo-Medrol last visit. Using oxygen only to sleep. Using Combivent inhaler one time daily.and Symbicort twice daily. Exercises at gym 4 days per week. Complains that calves tender and ache. [facility] did peripheral vascular evaluation, negative. Echocardiogram 10/06/2011-grade 2 diastolic dysfunction CXR 09/23/12 IMPRESSION:  COPD/chronic changes. No active cardiopulmonary disease.  Original Report Authenticated By: Charlett NoseKevin Dover, M.D.  03/20/13-  [age] yoF smoker followed for COPD, tobacco dependence and cough ACUTE VISIT: PT states she is unable to breath that she cant even get in the shower mowed yard-started having increased SOB since then-got worse over the weeked. Continues to use O2, nebulizer, and inhalers-slight increase in use.  Still smoking against repeated counseling Ok at baseline until riding mower dusty 6 days ago. Progressive SOB through week, clear mucus, scant. Needs to stay on O2 more of time. Using neb 1-3x/ day.  07/13/13- 67 yoF smoker followed for COPD, tobacco dependence and cough FOLLOWS FOR: having cough(started feeling bad on Sunday). SOB and wheezing. Increased shortness of breath with dyspnea on exertion especially in the last 4 days. Doesn't feel well. Cough interfering with sleep in recliner this week. Might have low-grade fever. Sore throat last  week. Unfortunately still smokes despite counseling. Has been going to [employer] for exercise and has usually felt comfortable off of oxygen during the daytime until this week.  Review of Systems-see HPI Constitutional:   No-   weight loss, night sweats, fevers, chills, fatigue, lassitude. HEENT:   No-  headaches, difficulty swallowing, tooth/dental problems, sore throat,       No- sneezing, itching, no-ear ache, +nasal congestion, post nasal drip,  CV:  No- chest pain, orthopnea, PND, swelling in lower extremities, anasarca, dizziness, palpitations.  Resp: Per HPI-     +shortness of breath with exertion or at rest.              +  productive cough,  non-productive cough,  No-  coughing up of blood.              No-  change in color of mucus.  + wheezing.   Skin: No-   rash or lesions. GI:  No-   heartburn, indigestion, abdominal pain, nausea, vomiting,  GU: MS:  No acute arthralgias Neuro-  Psych:  No- change in mood or affect. depression or anxiety.  No memory loss.   Objective:   Physical Exam  General- Alert, Oriented, Affect-not anxious, Distress- none acute, conversational on O2,  Skin- no rash. + Feels warm Lymphadenopathy- none Head- atraumatic            Eyes- Gross vision intact, PERRLA, conjunctivae clear secretions            Ears- Hearing, canals-normal            Nose- + steady sniffing and turbinate edema, no-Septal dev, m polyps, erosion, perforation. .             Throat- Mallampati II , mucosa clear , drainage- none, tonsils- atrophic.   Neck-  trachea midline, no stridor , thyroid nl, carotid no bruit. Chest - symmetrical excursion , unlabored           Heart/CV- RRR , no murmur , no gallop  , no rub, nl s1 s2                           - JVD- none , edema-none, stasis changes- none, varices- none           Lung- distant , wheeze- none, unlabored, +cough- dry , dullness-none, rub- none           Chest wall-  Abd- Br/ Gen/ Rectal- Not done, not  indicated Extrem- cyanosis- none, clubbing, none, atrophy- none, strength- nl. . Neuro- grossly intact to observation

## 2013-07-13 NOTE — Patient Instructions (Addendum)
Neb xop 0.63  Depo 80  Scripts printed for prednisone taper and doxycycline  Get enough fluids and avoid getting chilled  Sample Breo Ellipta   1 puff then rinse mouth ONE TIME, DAILY

## 2013-07-30 ENCOUNTER — Encounter: Payer: Self-pay | Admitting: Internal Medicine

## 2013-07-30 NOTE — Assessment & Plan Note (Signed)
I pointed out that her ongoing smoking can only make this worse. 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

## 2013-07-30 NOTE — Assessment & Plan Note (Addendum)
Acute exacerbation consistent with viral infection Plan smoking cessation, nebulizer Xopenex, Depo-Medrol, prescription for doxycycline and prednisone to hold, fluids, try sample [facility]

## 2013-08-24 ENCOUNTER — Other Ambulatory Visit: Payer: Self-pay | Admitting: Internal Medicine

## 2013-08-28 ENCOUNTER — Other Ambulatory Visit: Payer: Self-pay | Admitting: Internal Medicine

## 2013-08-30 NOTE — Telephone Encounter (Signed)
Please advise if okay to refill. Thanks.  

## 2013-08-31 NOTE — Telephone Encounter (Signed)
Ok to refill xanax for total 6 months

## 2013-08-31 NOTE — Telephone Encounter (Signed)
Called refills to pharmacy voicemail.  

## 2013-09-11 ENCOUNTER — Telehealth: Payer: Self-pay | Admitting: Internal Medicine

## 2013-09-11 MED ORDER — PREDNISONE 10 MG PO TABS: ORAL_TABLET | ORAL | Status: DC

## 2013-09-11 NOTE — Telephone Encounter (Signed)
Pt aware of recs. rx sent. Nothing further needed 

## 2013-09-11 NOTE — Telephone Encounter (Signed)
Called spoke with patient who denies any current symptoms but is requesting a prescription for prednisone "just in case" because her daughter and granddaughter have been ill w/ bronchitis/fever.    Last ov 10.23.14 w/ CDY: Patient Instructions     Neb xop 0.63  Depo 80  Scripts printed for prednisone taper and doxycycline  Get enough fluids and avoid getting chilled  Sample Breo Ellipta 1 puff then rinse mouth ONE TIME, DAILY   Pt stated that she did fill the prednisone and doxycycline from last ov, but only ended up taking the pred taper.   Pt is requesting another rx for prednisone Dr Maple Hudson please advise, thank you.  [facility] NKDA - verified

## 2013-09-11 NOTE — Telephone Encounter (Signed)
Per CY-okay to give Prednisone 10 mg #20 take 4 x 2 days, 3 x 2 days, 2 x 2 days, 1 x 2 days, then stop no refills. Thanks.

## 2013-11-14 ENCOUNTER — Ambulatory Visit: Payer: Medicare Other | Admitting: Internal Medicine

## 2013-12-12 ENCOUNTER — Ambulatory Visit: Payer: Medicare HMO | Admitting: Internal Medicine

## 2013-12-12 ENCOUNTER — Other Ambulatory Visit: Payer: Self-pay | Admitting: Internal Medicine

## 2014-01-16 ENCOUNTER — Encounter: Payer: Self-pay | Admitting: Internal Medicine

## 2014-01-16 ENCOUNTER — Ambulatory Visit (INDEPENDENT_AMBULATORY_CARE_PROVIDER_SITE_OTHER): Payer: Medicare HMO | Admitting: Internal Medicine

## 2014-01-16 ENCOUNTER — Ambulatory Visit (INDEPENDENT_AMBULATORY_CARE_PROVIDER_SITE_OTHER)
Admission: RE | Admit: 2014-01-16 | Discharge: 2014-01-16 | Disposition: A | Discharge status: Home / Self Care | Payer: Medicare HMO | Source: Ambulatory Visit | Attending: Internal Medicine | Admitting: Internal Medicine

## 2014-01-16 VITALS — BP 120/82 | HR 101 | Ht 60.0 in | Wt 121.6 lb

## 2014-01-16 DIAGNOSIS — M542 Cervicalgia: Secondary | ICD-10-CM

## 2014-01-16 DIAGNOSIS — J441 Chronic obstructive pulmonary disease with (acute) exacerbation: Secondary | ICD-10-CM

## 2014-01-16 DIAGNOSIS — J438 Other emphysema: Secondary | ICD-10-CM

## 2014-01-16 DIAGNOSIS — J309 Allergic rhinitis, unspecified: Secondary | ICD-10-CM

## 2014-01-16 DIAGNOSIS — J439 Emphysema, unspecified: Secondary | ICD-10-CM

## 2014-01-16 DIAGNOSIS — F17200 Nicotine dependence, unspecified, uncomplicated: Secondary | ICD-10-CM

## 2014-01-16 DIAGNOSIS — H1010 Acute atopic conjunctivitis, unspecified eye: Secondary | ICD-10-CM

## 2014-01-16 MED ORDER — BUDESONIDE-FORMOTEROL FUMARATE 160-4.5 MCG/ACT IN AERO: INHALATION_SPRAY | RESPIRATORY_TRACT | Status: DC

## 2014-01-16 NOTE — Progress Notes (Signed)
Patient ID: [name], female    DOB: 1945/10/16, 68 y.o.   MRN: [id number]  HPI [age] yoF smoker followed for COPD, tobacco dependence and cough. Centricity EMR reviewed for conversion to Epic.Has had extensive efforts at smoking counseling (husband died of tobacco induced lung cancer).  Comes today with increased cough since March 2, with concerns of relation to mold abatement being done in home. Refrigerator line leaked, later floor buckled and on removal, mold found beneath flooring. Thinks she may have coughed more since recent water leak onto kitchen floor. Denies fever, sore throat or obvious infection. Denies reflux. Has been more wheezey and short of breath Using her Advair and using Combivent 3 x/ day.   06/23/11- 64 yoF smoker followed for COPD, tobacco dependence and cough. She has had additional stress recently, son just had an MI and coronary stent. She has not tried to stop smoking despite counseling. She found Symbicort to be no better than Advair. Changing from a dry powder inhaler had no effect on her cough. Using rescue inhaler a little more. New complaint: Feels tight through the neck and unable to relax her neck and shoulders. A neck brace helps.  08/14/11 - 64 yoF smoker followed for COPD, tobacco dependence and cough. Acute visit- she is still smoking against advice. Has had flu vaccine. Now complains of acute illness for the past week. Chest tight, short of breath. Cough was productive but now is dry. Denies fever, chest pain blood or palpitation. 5 days ago and she accepted a prescription from us for Z-Pak, just finished today, but she says "it never helps". She is particularly concerned about recurrence of mucus plugging, since she understands that was the problem related to a sustained exacerbation with hospital stay in the past. She is using her rescue inhaler every 4 hours. CXR- 06/26/11- stable COPD, NAD.  10/01/11- 64 yoF smoker followed for COPD, tobacco dependence  and cough.  Increased SOB with walking or at rest-gotten worse since Christmas; prior to this was sick 08-14-2011-never regrouped from this. Wakes up at night gasping for air-has had to use rescue inhaler a few times when this happens. She finished prednisone and antibiotic that we had called in December 24 and has now been off of those for about 2 weeks. Stays short of breath as described. We discussed pending change of Combivent to the new delivery device. She is anxious about any change from familiar. Using Xanax about once daily. PFT- 07/07/11- severe obstructive airways disease with response to bronchodilator, hyperinflation, diffusion moderately reduced. FEV1 0.73/38%, FEV1/FVC 0.37, DLCO 45%. Loop configuration is emphysema.  10/21/11- 64 yoF smoker followed for COPD, tobacco dependence and cough. Post hospital-  Pt states increase SOB upon activity ,wheezing,denies any cough.     Daughter Kaitlyn RombergDrenda is here Hospitalized in January 17 through 10/12/2011. She got progressively worse after last visit here and went to the emergency room. Treated for COPD exacerbation. Now she still is not quite back to her baseline but says she has not smoked since leaving the hospital. I picked on this and try to reinforce it with support. Especially short of breath with any exertion. She is wearing oxygen but once the office so she can continue to live alone and do her own yard work. October I could not, as we could get her back off. Lisinopril was changed to Norvasc and I discussed potential for lisinopril to be associated with dry cough and some people. She is tapering off of prednisone. Echocardiogram  reviewed and discussed-pulmonary artery systolic   12/03/11- 64 yoF smoker followed for COPD, tobacco dependence and cough. She  still needs oxygen sometimes during the day. We discussed oxygen delivery devices. Little cough or phlegm. Had Pneumovax.  01/25/12- 64 yoF smoker followed for COPD, tobacco dependence and  cough.  Daughter here Acute visit-SOB getting worse in the past week; Prednisone was called in last week(took last pill this am); had head congestion, ears popping last week-called in ABX/ augmentin-not working. Friday called and was told to go to ER-pt did not go. Coughing-productive-looks like slight yellow in color and thick-feels better once getting it up; having to wear O2 several times during the day.   04/12/12-  64 yoF smoker followed for COPD, tobacco dependence and cough.   States about the same. c/o difficulty breathing and using oxygen more often due to the hot weather. Also occassional cough and wheezing.  COPD assessment test (CAT) score 20/40 She has a jury summons which we discussed. She is oxygen dependent and cannot count on her portable oxygen to last the length of a jury obligation. She stays indoors to avoid the heat and humidity, getting others to mow her yard.   07/14/12- 64 yoF smoker followed for COPD, tobacco dependence and cough.   Good and bad days with breathing; no more than usual SOB. COPD assessment test (CAT) score 15/40 Has had flu shot. Weather changes are bothering her. Avoids using her oxygen in the house if she is comfortable but sleeps with it every night- 2L/[employer]. Using metered inhaler once daily. Joined "[employer]" exercise program. Mentions burning occipital pain and I suggested she look into evaluation of her cervical spine. Complains of easy bruising without blood. On baby aspirin.  09/23/12-66 yoF smoker followed for COPD, tobacco dependence and cough.  ACUTE VISIT: Increased SOB than normal; cough. (started feeling bad last week-was given Predn taper and Doxy RX) not feeling any better. Head gets "full" after coughing so much and able to small amount of mucus up. She has not felt well since December. In the last week we had called in prednisone and doxycycline which ended today without benefit. Short of breath, chest congestion, dry cough, full in  head, ears and nose. No fever now but feels weak. She only admits to smoking 2 or 3 cigarettes daily but that is still  CT chest 10/08/11 IMPRESSION:  No evidence of pulmonary embolism or other acute findings. Stable  emphysematous lung disease.  Original Report Authenticated By: Reola CalkinsGLENN T. YAMAGATA, M.D.   01/12/13- [age] yoF smoker followed for COPD, tobacco dependence and cough.  FOLLOWS FOR: feeling better since 09/2012 visit; Denies any wheezing or SOB. States allergies are flaring up-mowed the yard on Tuesday. Eyes burn and itch, blamed on pollen No real change in her chest or shortness of breath with exertion. Felt much better after Depo-Medrol last visit. Using oxygen only to sleep. Using Combivent inhaler one time daily.and Symbicort twice daily. Exercises at gym 4 days per week. Complains that calves tender and ache. [facility] did peripheral vascular evaluation, negative. Echocardiogram 10/06/2011-grade 2 diastolic dysfunction CXR 09/23/12 IMPRESSION:  COPD/chronic changes. No active cardiopulmonary disease.  Original Report Authenticated By: Charlett NoseKevin Dover, M.D.  03/20/13-  [age] yoF smoker followed for COPD, tobacco dependence and cough ACUTE VISIT: PT states she is unable to breath that she cant even get in the shower mowed yard-started having increased SOB since then-got worse over the weeked. Continues to use O2, nebulizer, and inhalers-slight increase in use.  Still smoking against repeated counseling Ok at baseline until riding mower dusty 6 days ago. Progressive SOB through week, clear mucus, scant. Needs to stay on O2 more of time. Using neb 1-3x/ day.  07/13/13- 67 yoF smoker followed for COPD, tobacco dependence and cough FOLLOWS FOR: having cough(started feeling bad on Sunday). SOB and wheezing. Increased shortness of breath with dyspnea on exertion especially in the last 4 days. Doesn't feel well. Cough interfering with sleep in recliner this week. Might have low-grade fever. Sore throat last  week. Unfortunately still smokes despite counseling. Has been going to [employer] for exercise and has usually felt comfortable off of oxygen during the daytime until this week.  01/16/14- 67 yoF smoker followed for COPD, tobacco dependence and cough FOLLOWS FOR:  Breathing doing well.  Some days better than others Complains of neck pain -Incidental.  Took left over prednisone in early March for her breathing, none since. Chronic cough and dyspnea without chest pain or discolored sputum.  Review of Systems-see HPI Constitutional:   No-   weight loss, night sweats, fevers, chills, fatigue, lassitude. HEENT:   No-  headaches, difficulty swallowing, tooth/dental problems, sore throat,       No- sneezing, itching, no-ear ache, +nasal congestion, post nasal drip,  CV:  No- chest pain, orthopnea, PND, swelling in lower extremities, anasarca, dizziness, palpitations.  Resp: Per HPI-     +shortness of breath with exertion or at rest.              +  productive cough,  non-productive cough,  No-  coughing up of blood.              No-  change in color of mucus.  + wheezing.   Skin: No-   rash or lesions. GI:  No-   heartburn, indigestion, abdominal pain, nausea, vomiting,  GU: MS:  + neck pain w/o radicular symptoms Neuro-  Psych:  No- change in mood or affect. depression or anxiety.  No memory loss.   Objective:   Physical Exam  General- Alert, Oriented, Affect-not anxious, Distress- none acute, conversational on O2,  Skin- no rash. + Feels warm Lymphadenopathy- none Head- atraumatic            Eyes- Gross vision intact, PERRLA, conjunctivae clear secretions            Ears- Hearing, canals-normal            Nose- + steady sniffing and turbinate edema, no-Septal dev, m polyps, erosion, perforation. .             Throat- Mallampati II , mucosa clear , drainage- none, tonsils- atrophic.   Neck-  trachea midline, no stridor , thyroid nl, carotid no bruit.+ cervical kyphosis Chest -  symmetrical excursion , unlabored           Heart/CV- RRR , no murmur , no gallop  , no rub, nl s1 s2                           - JVD- none , edema-none, stasis changes- none, varices- none           Lung- distant , wheeze- none, unlabored, +cough- dry , dullness-none, rub- none           Chest wall-  Abd- Br/ Gen/ Rectal- Not done, not indicated Extrem- +scab on R lower leg- slipped on stairs Neuro- grossly intact to observation

## 2014-01-16 NOTE — Patient Instructions (Signed)
Order- CXR dx COPD  Script sent refilling Symbicort

## 2014-01-17 NOTE — Progress Notes (Signed)
Quick Note:  Spoke with pt, she is aware of results and recs. Nothing further needed. ______ 

## 2014-02-12 DIAGNOSIS — M542 Cervicalgia: Secondary | ICD-10-CM | POA: Insufficient documentation

## 2014-02-12 NOTE — Assessment & Plan Note (Signed)
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 Assume she has degenerative disc disease. Plan-I asked her to speak to  her primary physician about this.

## 2014-02-12 NOTE — Assessment & Plan Note (Signed)
Near baseline with chronic bronchitis Plan-stop smoking, refill Symbicort, chest x-ray

## 2014-02-12 NOTE — Assessment & Plan Note (Signed)
controlled 

## 2014-02-12 NOTE — Assessment & Plan Note (Signed)
She still admits to smoking 45 cigarettes a day and I cannot get her lower. Encouragement again given.

## 2014-03-08 ENCOUNTER — Ambulatory Visit: Payer: Self-pay | Admitting: Podiatry

## 2014-03-12 ENCOUNTER — Ambulatory Visit (INDEPENDENT_AMBULATORY_CARE_PROVIDER_SITE_OTHER): Payer: Medicare HMO | Admitting: Internal Medicine

## 2014-03-12 ENCOUNTER — Other Ambulatory Visit: Payer: Medicare HMO

## 2014-03-12 ENCOUNTER — Encounter: Payer: Self-pay | Admitting: Internal Medicine

## 2014-03-12 ENCOUNTER — Telehealth: Payer: Self-pay | Admitting: Internal Medicine

## 2014-03-12 VITALS — BP 122/86 | HR 106 | Ht 60.0 in | Wt 120.0 lb

## 2014-03-12 DIAGNOSIS — M79609 Pain in unspecified limb: Secondary | ICD-10-CM

## 2014-03-12 DIAGNOSIS — M79662 Pain in left lower leg: Secondary | ICD-10-CM

## 2014-03-12 DIAGNOSIS — J441 Chronic obstructive pulmonary disease with (acute) exacerbation: Secondary | ICD-10-CM

## 2014-03-12 DIAGNOSIS — R0989 Other specified symptoms and signs involving the circulatory and respiratory systems: Secondary | ICD-10-CM

## 2014-03-12 DIAGNOSIS — R0600 Dyspnea, unspecified: Secondary | ICD-10-CM

## 2014-03-12 DIAGNOSIS — F17200 Nicotine dependence, unspecified, uncomplicated: Secondary | ICD-10-CM

## 2014-03-12 DIAGNOSIS — R0609 Other forms of dyspnea: Secondary | ICD-10-CM

## 2014-03-12 LAB — D-DIMER, QUANTITATIVE (NOT AT ARMC): D DIMER QUANT: 0.37 ug{FEU}/mL (ref 0.00–0.48)

## 2014-03-12 MED ORDER — METHYLPREDNISOLONE ACETATE 80 MG/ML IJ SUSP
80.0000 mg | Freq: Once | INTRAMUSCULAR | Status: AC
  Administered 2014-03-12: 80 mg via INTRAMUSCULAR

## 2014-03-12 MED ORDER — LEVALBUTEROL HCL 0.63 MG/3ML IN NEBU
0.6300 mg | INHALATION_SOLUTION | Freq: Once | RESPIRATORY_TRACT | Status: AC
Start: 2014-03-12 — End: 2014-03-12
  Administered 2014-03-12: 0.63 mg via RESPIRATORY_TRACT

## 2014-03-12 NOTE — Telephone Encounter (Signed)
Called spoke with pt. She is requesting an appt with CDY this afternoon. She was having hard time talking on the phone d/t her SOB.  Pt saw PCP last week and was giving prednisone and ABX. Pt not feeling any better.  Please advise Dr. Maple HudsonYoung thanks  No Known Allergies   Current Outpatient Prescriptions on File Prior to Visit  Medication Sig Dispense Refill  . ALPRAZolam (XANAX) 0.5 MG tablet TAKE ONE TABLET BY MOUTH THREE TIMES DAILY AS NEEDED FOR ANXIETY OR  SLEEP  60 tablet  5  . aspirin 81 MG tablet Take 81 mg by mouth daily.        . budesonide-formoterol (SYMBICORT) 160-4.5 MCG/ACT inhaler 2 puffs then rinse mouth, twice daily  1 Inhaler  prn  . COMBIVENT RESPIMAT 20-100 MCG/ACT AERS respimat INHALE ONE PUFF INTO THE LUNGS EVERY 6 HOURS AS NEEDED FOR WHEEZE OR SHORTNESS OF BREATH.  1 Inhaler  2  . Fluticasone Furoate-Vilanterol (BREO ELLIPTA) 100-25 MCG/INH AEPB Inhale 1 puff into the lungs daily.  1 each  0  . ipratropium-albuterol (DUONEB) 0.5-2.5 (3) MG/3ML SOLN Take 3 mLs by nebulization 2 (two) times daily. DX 496  180 mL  PRN  . lisinopril (PRINIVIL,ZESTRIL) 20 MG tablet Take 20 mg by mouth daily.      . [DISCONTINUED] amLODipine (NORVASC) 10 MG tablet Take 1 tablet (10 mg total) by mouth daily.  30 tablet  3  . [DISCONTINUED] pantoprazole (PROTONIX) 40 MG tablet Take 1 tablet (40 mg total) by mouth 2 (two) times daily before a meal.  60 tablet  3   No current facility-administered medications on file prior to visit.

## 2014-03-12 NOTE — Telephone Encounter (Signed)
ATC pt line rang numerous times and no VM WCB

## 2014-03-12 NOTE — Progress Notes (Signed)
Patient ID: [name], female    DOB: 1945/10/16, 68 y.o.   MRN: [id number]  HPI [age] yoF smoker followed for COPD, tobacco dependence and cough. [employer] EMR reviewed for conversion to Epic.Has had extensive efforts at smoking counseling (husband died of tobacco induced lung cancer).  Comes today with increased cough since March 2, with concerns of relation to mold abatement being done in home. Refrigerator line leaked, later floor buckled and on removal, mold found beneath flooring. Thinks she may have coughed more since recent water leak onto kitchen floor. Denies fever, sore throat or obvious infection. Denies reflux. Has been more wheezey and short of breath Using her Advair and using Combivent 3 x/ day.   06/23/11- 64 yoF smoker followed for COPD, tobacco dependence and cough. She has had additional stress recently, son just had an MI and coronary stent. She has not tried to stop smoking despite counseling. She found Symbicort to be no better than Advair. Changing from a dry powder inhaler had no effect on her cough. Using rescue inhaler a little more. New complaint: Feels tight through the neck and unable to relax her neck and shoulders. A neck brace helps.  08/14/11 - 64 yoF smoker followed for COPD, tobacco dependence and cough. Acute visit- she is still smoking against advice. Has had flu vaccine. Now complains of acute illness for the past week. Chest tight, short of breath. Cough was productive but now is dry. Denies fever, chest pain blood or palpitation. 5 days ago and she accepted a prescription from us for Z-Pak, just finished today, but she says "it never helps". She is particularly concerned about recurrence of mucus plugging, since she understands that was the problem related to a sustained exacerbation with hospital stay in the past. She is using her rescue inhaler every 4 hours. CXR- 06/26/11- stable COPD, NAD.  10/01/11- 64 yoF smoker followed for COPD, tobacco dependence  and cough.  Increased SOB with walking or at rest-gotten worse since Christmas; prior to this was sick 08-14-2011-never regrouped from this. Wakes up at night gasping for air-has had to use rescue inhaler a few times when this happens. She finished prednisone and antibiotic that we had called in December 24 and has now been off of those for about 2 weeks. Stays short of breath as described. We discussed pending change of Combivent to the new delivery device. She is anxious about any change from familiar. Using Xanax about once daily. PFT- 07/07/11- severe obstructive airways disease with response to bronchodilator, hyperinflation, diffusion moderately reduced. FEV1 0.73/38%, FEV1/FVC 0.37, DLCO 45%. Loop configuration is emphysema.  10/21/11- 64 yoF smoker followed for COPD, tobacco dependence and cough. Post hospital-  Pt states increase SOB upon activity ,wheezing,denies any cough.     Daughter [name] is here Hospitalized in January 17 through 10/12/2011. She got progressively worse after last visit here and went to the emergency room. Treated for COPD exacerbation. Now she still is not quite back to her baseline but says she has not smoked since leaving the hospital. I picked on this and try to reinforce it with support. Especially short of breath with any exertion. She is wearing oxygen but once the office so she can continue to live alone and do her own yard work. October I could not, as we could get her back off. Lisinopril was changed to Norvasc and I discussed potential for lisinopril to be associated with dry cough and some people. She is tapering off of prednisone. Echocardiogram  reviewed and discussed-pulmonary artery systolic   12/03/11- 64 yoF smoker followed for COPD, tobacco dependence and cough. She  still needs oxygen sometimes during the day. We discussed oxygen delivery devices. Little cough or phlegm. Had Pneumovax.  01/25/12- 64 yoF smoker followed for COPD, tobacco dependence and  cough.  Daughter here Acute visit-SOB getting worse in the past week; Prednisone was called in last week(took last pill this am); had head congestion, ears popping last week-called in ABX/ augmentin-not working. Friday called and was told to go to ER-pt did not go. Coughing-productive-looks like slight yellow in color and thick-feels better once getting it up; having to wear O2 several times during the day.   04/12/12-  64 yoF smoker followed for COPD, tobacco dependence and cough.   States about the same. c/o difficulty breathing and using oxygen more often due to the hot weather. Also occassional cough and wheezing.  COPD assessment test (CAT) score 20/40 She has a jury summons which we discussed. She is oxygen dependent and cannot count on her portable oxygen to last the length of a jury obligation. She stays indoors to avoid the heat and humidity, getting others to mow her yard.   07/14/12- 64 yoF smoker followed for COPD, tobacco dependence and cough.   Good and bad days with breathing; no more than usual SOB. COPD assessment test (CAT) score 15/40 Has had flu shot. Weather changes are bothering her. Avoids using her oxygen in the house if she is comfortable but sleeps with it every night- 2L/[employer]. Using metered inhaler once daily. Joined "[employer]" exercise program. Mentions burning occipital pain and I suggested she look into evaluation of her cervical spine. Complains of easy bruising without blood. On baby aspirin.  09/23/12-66 yoF smoker followed for COPD, tobacco dependence and cough.  ACUTE VISIT: Increased SOB than normal; cough. (started feeling bad last week-was given Predn taper and Doxy RX) not feeling any better. Head gets "full" after coughing so much and able to small amount of mucus up. She has not felt well since December. In the last week we had called in prednisone and doxycycline which ended today without benefit. Short of breath, chest congestion, dry cough, full in  head, ears and nose. No fever now but feels weak. She only admits to smoking 2 or 3 cigarettes daily but that is still  CT chest 10/08/11 IMPRESSION:  No evidence of pulmonary embolism or other acute findings. Stable  emphysematous lung disease.  Original Report Authenticated By: Reola CalkinsGLENN T. YAMAGATA, M.D.   01/12/13- [age] yoF smoker followed for COPD, tobacco dependence and cough.  FOLLOWS FOR: feeling better since 09/2012 visit; Denies any wheezing or SOB. States allergies are flaring up-mowed the yard on Tuesday. Eyes burn and itch, blamed on pollen No real change in her chest or shortness of breath with exertion. Felt much better after Depo-Medrol last visit. Using oxygen only to sleep. Using Combivent inhaler one time daily.and Symbicort twice daily. Exercises at gym 4 days per week. Complains that calves tender and ache. [facility] did peripheral vascular evaluation, negative. Echocardiogram 10/06/2011-grade 2 diastolic dysfunction CXR 09/23/12 IMPRESSION:  COPD/chronic changes. No active cardiopulmonary disease.  Original Report Authenticated By: Charlett NoseKevin Dover, M.D.  03/20/13-  [age] yoF smoker followed for COPD, tobacco dependence and cough ACUTE VISIT: PT states she is unable to breath that she cant even get in the shower mowed yard-started having increased SOB since then-got worse over the weeked. Continues to use O2, nebulizer, and inhalers-slight increase in use.  Still smoking against repeated counseling Ok at baseline until riding mower dusty 6 days ago. Progressive SOB through week, clear mucus, scant. Needs to stay on O2 more of time. Using neb 1-3x/ day.  07/13/13- 67 yoF smoker followed for COPD, tobacco dependence and cough FOLLOWS FOR: having cough(started feeling bad on Sunday). SOB and wheezing. Increased shortness of breath with dyspnea on exertion especially in the last 4 days. Doesn't feel well. Cough interfering with sleep in recliner this week. Might have low-grade fever. Sore throat last  week. Unfortunately still smokes despite counseling. Has been going to [employer] for exercise and has usually felt comfortable off of oxygen during the daytime until this week.  01/16/14- 67 yoF smoker followed for COPD, tobacco dependence and cough FOLLOWS FOR:  Breathing doing well.  Some days better than others Complains of neck pain -Incidental.  Took left over prednisone in early March for her breathing, none since. Chronic cough and dyspnea without chest pain or discolored sputum.  03/12/14- 67 yoF smoker followed for COPD, tobacco dependence and cough ACUTE VISIT:  Increased sob, tightness in chest and some cough x1 week.  On antibiotics 6 days no relief 2 weks ago had sore throat then nose blowing, increased SOB. Took left-over prednisone. PCP gave levaquin 750 mg day 6/10, depomedrol. Has another prednisone taper to take if needed. L calf hurts with standing.  O2 2L CXR 01/16/14 IMPRESSION:  No evidence of acute cardiopulmonary disease.  Electronically Signed  By: Charline BillsSriyesh Krishnan M.D.  On: 01/16/2014 16:47   Review of Systems-see HPI Constitutional:   No-   weight loss, night sweats, fevers, chills, fatigue, lassitude. HEENT:   No-  headaches, difficulty swallowing, tooth/dental problems, sore throat,       No- sneezing, itching, no-ear ache, +nasal congestion, post nasal drip,  CV:  No- chest pain, orthopnea, PND, swelling in lower extremities, anasarca, dizziness, palpitations.  Resp: Per HPI-     +shortness of breath with exertion or at rest.              +  productive cough,  +-productive cough,  No-  coughing up of blood.              No-  change in color of mucus.  + wheezing.   Skin: No-   rash or lesions. GI:  No-   heartburn, indigestion, abdominal pain, nausea, vomiting,  GU: MS:  + neck pain w/o radicular symptoms Neuro-  Psych:  No- change in mood or affect. depression or anxiety.  No memory loss.   Objective:   Physical Exam  General- Alert,  Oriented, Affect-not anxious, Distress- none acute, conversational on O2,  Skin- no rash. + Feels warm Lymphadenopathy- none Head- atraumatic            Eyes- Gross vision intact, PERRLA, conjunctivae clear secretions            Ears- Hearing, canals-normal            Nose- + steady sniffing and turbinate edema, no-Septal dev,  polyps, erosion, perforation. .             Throat- Mallampati II , mucosa clear , drainage- none, tonsils- atrophic.   Neck-  trachea midline, no stridor , thyroid nl, carotid no bruit.+ cervical kyphosis Chest - symmetrical excursion , unlabored           Heart/CV- RRR , no murmur , no gallop  , no rub, nl s1 s2                           -  JVD- none , edema-none, stasis changes- none, varices- none           Lung- distant , wheeze+expiratory, unlabored, +cough- dry , dullness-none, rub- none           Chest wall-  Abd- Br/ Gen/ Rectal- Not done, not indicated Extrem- +wheelchair. Calves soft, NEG Homan's Neuro- grossly intact to observation

## 2014-03-12 NOTE — Telephone Encounter (Signed)
Per CY: Ok to have pt come in today at 4:15 pm  Called, spoke with pt. Pt can come in at 4:15 pm today. Appt scheduled - pt aware and voiced no further questions or concerns at this time.

## 2014-03-12 NOTE — Patient Instructions (Signed)
Neb xop 0.63  Depo 80  Order- lab- D dimer    Dx dyspnea  Finish the levaquin antibiotic and continue your routine meds  Keep appointment in October unless needed sooner

## 2014-03-14 ENCOUNTER — Telehealth: Payer: Self-pay | Admitting: Internal Medicine

## 2014-03-14 ENCOUNTER — Other Ambulatory Visit: Payer: Self-pay | Admitting: Internal Medicine

## 2014-03-14 MED ORDER — IPRATROPIUM-ALBUTEROL 0.5-2.5 (3) MG/3ML IN SOLN: 3.0000 mL | Freq: Two times a day (BID) | RESPIRATORY_TRACT | Status: DC

## 2014-03-14 NOTE — Telephone Encounter (Signed)
Ok to refill 

## 2014-03-14 NOTE — Telephone Encounter (Signed)
CY, Please advise if okay to refill. Thanks.  

## 2014-03-14 NOTE — Telephone Encounter (Signed)
Spoke with the pt  She states needing new rx for Duoneb faxed to [facility]  Rx was faxed Nothing further needed

## 2014-03-16 ENCOUNTER — Telehealth: Payer: Self-pay | Admitting: Internal Medicine

## 2014-03-16 DIAGNOSIS — M79662 Pain in left lower leg: Secondary | ICD-10-CM | POA: Insufficient documentation

## 2014-03-16 MED ORDER — PREDNISONE 10 MG PO TABS: ORAL_TABLET | ORAL | Status: DC

## 2014-03-16 MED ORDER — AMOXICILLIN 500 MG PO TABS: 500.0000 mg | ORAL_TABLET | Freq: Two times a day (BID) | ORAL | Status: DC

## 2014-03-16 MED ORDER — ALPRAZOLAM 0.5 MG PO TABS: ORAL_TABLET | ORAL | Status: DC

## 2014-03-16 NOTE — Telephone Encounter (Signed)
Per OV 03/12/14; Patient Instructions      Neb xop 0.63 Depo 3480 Order- lab- D dimer    Dx dyspnea Finish the levaquin antibiotic and continue your routine meds Keep appointment in October unless needed sooner  ---  Called spoke with pt. She reports she is still having a terrible time with her breathing. She took her last ABX today. She wants to know if she needs another round and round prednisone.  Also wants refill on alprazolam. Last refilled 08/28/13 #60 x 5 refills Please advise Dr. Maple HudsonYoung thanks  No Known Allergies   Current Outpatient Prescriptions on File Prior to Visit  Medication Sig Dispense Refill  . ALPRAZolam (XANAX) 0.5 MG tablet TAKE ONE TABLET BY MOUTH THREE TIMES DAILY AS NEEDED FOR ANXIETY OR  SLEEP  60 tablet  5  . aspirin 81 MG tablet Take 81 mg by mouth daily.        . budesonide-formoterol (SYMBICORT) 160-4.5 MCG/ACT inhaler 2 puffs then rinse mouth, twice daily  1 Inhaler  prn  . COMBIVENT RESPIMAT 20-100 MCG/ACT AERS respimat INHALE ONE PUFF INTO THE LUNGS EVERY 6 HOURS AS NEEDED FOR WHEEZE OR SHORTNESS OF BREATH.  1 Inhaler  2  . ipratropium-albuterol (DUONEB) 0.5-2.5 (3) MG/3ML SOLN Take 3 mLs by nebulization 2 (two) times daily. DX 496  180 mL  11  . levofloxacin (LEVAQUIN) 750 MG tablet Take 750 mg by mouth daily.      Marland Kitchen. lisinopril (PRINIVIL,ZESTRIL) 20 MG tablet Take 20 mg by mouth daily.      . [DISCONTINUED] amLODipine (NORVASC) 10 MG tablet Take 1 tablet (10 mg total) by mouth daily.  30 tablet  3  . [DISCONTINUED] pantoprazole (PROTONIX) 40 MG tablet Take 1 tablet (40 mg total) by mouth 2 (two) times daily before a meal.  60 tablet  3   No current facility-administered medications on file prior to visit.

## 2014-03-16 NOTE — Telephone Encounter (Signed)
Per CDY: Ok to [facility]ill [phone] Pt aware. Nothing further needed.

## 2014-03-16 NOTE — Assessment & Plan Note (Signed)
Acute exacerbation Plan- depomedrol, neb xop, finish levaquin

## 2014-03-16 NOTE — Assessment & Plan Note (Signed)
Doubt DVT but will check D-dimer

## 2014-03-16 NOTE — Assessment & Plan Note (Signed)
Slowing down but needs to quit- reinforced

## 2014-03-16 NOTE — Telephone Encounter (Signed)
Pt aware of recs. RX'se sent. Please advise regarding alprazolam refill thanks

## 2014-03-16 NOTE — Telephone Encounter (Signed)
Would repeat prednisone taper- I think she had one, or send pred 10 mg, # 20, 4 X 2 DAYS, 3 X 2 DAYS, 2 X 2 DAYS, 1 X 2 DAYS Suggest we change abx to amox 500 mg, # 14, 1 twice daily

## 2014-03-26 NOTE — Telephone Encounter (Signed)
Ok to refill 6 months 

## 2014-03-26 NOTE — Telephone Encounter (Signed)
Called refill to pharmacy voicemail.  

## 2014-05-04 ENCOUNTER — Telehealth: Payer: Self-pay | Admitting: *Deleted

## 2014-05-04 NOTE — Telephone Encounter (Signed)
Forms signed by CY and faxed back.  Placed forms on Katie's desk and will forward message to her to follow up on PA.

## 2014-05-04 NOTE — Telephone Encounter (Signed)
PA forms for symbicort and combivent have been filled out and placed on CY cart to be signed.

## 2014-05-11 NOTE — Telephone Encounter (Signed)
CY, please advise on Symbicort----was denied through insurance. Combivent did not need PA-will pay co-pay as stated.

## 2014-05-11 NOTE — Telephone Encounter (Signed)
We should have her find out what her insurance prefers instead of Symbicort- Advair, Dulera or Breo, and go with what they will cover.

## 2014-05-16 NOTE — Telephone Encounter (Signed)
Spoke with rep at [facility]- states this is for a tier exception not a PA-pt wants to get medications lowered to lower cost tier so she can afford them. Pt made aware of this and states that she spoke with someone at [facility] as well yesterday and they were putting the medications back through for another tier exception. [employer] rep confirmed that Symbicort can not go down in tier as their are no alternatives. They can try again for Combivent. Pt is aware that she will have to continue paying co-pay each month for Symbicort as any of the alternatives CY listed are on Tier 3 as well.   Rep with [employer] transferred me to Clinical review department to see if a tier exception can be done over the phone for Combivent. [employer] will fax paper to have CY fill out and fax back for this.

## 2014-05-17 NOTE — Telephone Encounter (Signed)
Patient calling back.  [phone]

## 2014-05-17 NOTE — Telephone Encounter (Signed)
Spoke with patient-she is aware that we are waiting for approval/denial of tier exception from [facility]. Pt aware that I will call her with updates.  CY did suggest if tier is denied then we could look at giving Duoneb solution for patient.

## 2014-05-30 MED ORDER — ALBUTEROL SULFATE HFA 108 (90 BASE) MCG/ACT IN AERS: 1.0000 | INHALATION_SPRAY | Freq: Four times a day (QID) | RESPIRATORY_TRACT | Status: DC | PRN

## 2014-05-30 NOTE — Telephone Encounter (Signed)
We got denial from [facility] to make Tier exception; she is aware that she can/should use Duoneb that she has at home to take the place of Connivent as she is in the donut hole and states she can not afford the Combivent. Pt then stated she would like to have a basic rescue inhaler to use as needed when she is outside of the home.   CY states its okay to send Rx for Ventolin HFA #1 2 puffs QID prn SOB or wheezing with prn refills.  I have sent Rx to [facility] and patient is aware. Nothing more needed at this time.

## 2014-06-05 ENCOUNTER — Encounter: Payer: Self-pay | Admitting: Internal Medicine

## 2014-06-05 ENCOUNTER — Ambulatory Visit (INDEPENDENT_AMBULATORY_CARE_PROVIDER_SITE_OTHER): Payer: Medicare HMO | Admitting: Internal Medicine

## 2014-06-05 ENCOUNTER — Telehealth: Payer: Self-pay | Admitting: Internal Medicine

## 2014-06-05 VITALS — BP 144/78 | HR 93 | Ht 60.0 in | Wt 120.0 lb

## 2014-06-05 DIAGNOSIS — J439 Emphysema, unspecified: Secondary | ICD-10-CM

## 2014-06-05 DIAGNOSIS — J438 Other emphysema: Secondary | ICD-10-CM

## 2014-06-05 DIAGNOSIS — F17200 Nicotine dependence, unspecified, uncomplicated: Secondary | ICD-10-CM

## 2014-06-05 DIAGNOSIS — J441 Chronic obstructive pulmonary disease with (acute) exacerbation: Secondary | ICD-10-CM

## 2014-06-05 MED ORDER — PREDNISONE 10 MG PO TABS: ORAL_TABLET | ORAL | Status: DC

## 2014-06-05 MED ORDER — PROMETHAZINE-CODEINE 6.25-10 MG/5ML PO SYRP: 5.0000 mL | ORAL_SOLUTION | Freq: Four times a day (QID) | ORAL | Status: DC | PRN

## 2014-06-05 MED ORDER — METHYLPREDNISOLONE ACETATE 80 MG/ML IJ SUSP
80.0000 mg | Freq: Once | INTRAMUSCULAR | Status: AC
  Administered 2014-06-05: 80 mg via INTRAMUSCULAR

## 2014-06-05 MED ORDER — LEVALBUTEROL HCL 0.63 MG/3ML IN NEBU
0.6300 mg | INHALATION_SOLUTION | Freq: Once | RESPIRATORY_TRACT | Status: AC
  Administered 2014-06-05: 0.63 mg via RESPIRATORY_TRACT

## 2014-06-05 NOTE — Telephone Encounter (Signed)
Pt was scheduled to see CY this afternoon at 4:15pm; nothing more needed at this time.

## 2014-06-05 NOTE — Progress Notes (Signed)
Patient ID: Lissa MoralesFrances L Conchas, female    DOB: 1945/10/16, 68 y.o.   MRN: [id number]  HPI [age] yoF smoker followed for COPD, tobacco dependence and cough. [employer] EMR reviewed for conversion to Epic.Has had extensive efforts at smoking counseling (husband died of tobacco induced lung cancer).  Comes today with increased cough since March 2, with concerns of relation to mold abatement being done in home. Refrigerator line leaked, later floor buckled and on removal, mold found beneath flooring. Thinks she may have coughed more since recent water leak onto kitchen floor. Denies fever, sore throat or obvious infection. Denies reflux. Has been more wheezey and short of breath Using her Advair and using Combivent 3 x/ day.   06/23/11- 64 yoF smoker followed for COPD, tobacco dependence and cough. She has had additional stress recently, son just had an MI and coronary stent. She has not tried to stop smoking despite counseling. She found Symbicort to be no better than Advair. Changing from a dry powder inhaler had no effect on her cough. Using rescue inhaler a little more. New complaint: Feels tight through the neck and unable to relax her neck and shoulders. A neck brace helps.  08/14/11 - 64 yoF smoker followed for COPD, tobacco dependence and cough. Acute visit- she is still smoking against advice. Has had flu vaccine. Now complains of acute illness for the past week. Chest tight, short of breath. Cough was productive but now is dry. Denies fever, chest pain blood or palpitation. 5 days ago and she accepted a prescription from us for Z-Pak, just finished today, but she says "it never helps". She is particularly concerned about recurrence of mucus plugging, since she understands that was the problem related to a sustained exacerbation with hospital stay in the past. She is using her rescue inhaler every 4 hours. CXR- 06/26/11- stable COPD, NAD.  10/01/11- 64 yoF smoker followed for COPD, tobacco dependence  and cough.  Increased SOB with walking or at rest-gotten worse since Christmas; prior to this was sick 08-14-2011-never regrouped from this. Wakes up at night gasping for air-has had to use rescue inhaler a few times when this happens. She finished prednisone and antibiotic that we had called in December 24 and has now been off of those for about 2 weeks. Stays short of breath as described. We discussed pending change of Combivent to the new delivery device. She is anxious about any change from familiar. Using Xanax about once daily. PFT- 07/07/11- severe obstructive airways disease with response to bronchodilator, hyperinflation, diffusion moderately reduced. FEV1 0.73/38%, FEV1/FVC 0.37, DLCO 45%. Loop configuration is emphysema.  10/21/11- 64 yoF smoker followed for COPD, tobacco dependence and cough. Post hospital-  Pt states increase SOB upon activity ,wheezing,denies any cough.     [name] is here Hospitalized in January 17 through 10/12/2011. She got progressively worse after last visit here and went to the emergency room. Treated for COPD exacerbation. Now she still is not quite back to her baseline but says she has not smoked since leaving the hospital. I picked on this and try to reinforce it with support. Especially short of breath with any exertion. She is wearing oxygen but once the office so she can continue to live alone and do her own yard work. October I could not, as we could get her back off. Lisinopril was changed to Norvasc and I discussed potential for lisinopril to be associated with dry cough and some people. She is tapering off of prednisone. Echocardiogram  reviewed and discussed-pulmonary artery systolic   12/03/11- 64 yoF smoker followed for COPD, tobacco dependence and cough. She  still needs oxygen sometimes during the day. We discussed oxygen delivery devices. Little cough or phlegm. Had Pneumovax.  01/25/12- 64 yoF smoker followed for COPD, tobacco dependence and  cough.  Daughter here Acute visit-SOB getting worse in the past week; Prednisone was called in last week(took last pill this am); had head congestion, ears popping last week-called in ABX/ augmentin-not working. Friday called and was told to go to ER-pt did not go. Coughing-productive-looks like slight yellow in color and thick-feels better once getting it up; having to wear O2 several times during the day.   04/12/12-  64 yoF smoker followed for COPD, tobacco dependence and cough.   States about the same. c/o difficulty breathing and using oxygen more often due to the hot weather. Also occassional cough and wheezing.  COPD assessment test (CAT) score 20/40 She has a jury summons which we discussed. She is oxygen dependent and cannot count on her portable oxygen to last the length of a jury obligation. She stays indoors to avoid the heat and humidity, getting others to mow her yard.   07/14/12- 64 yoF smoker followed for COPD, tobacco dependence and cough.   Good and bad days with breathing; no more than usual SOB. COPD assessment test (CAT) score 15/40 Has had flu shot. Weather changes are bothering her. Avoids using her oxygen in the house if she is comfortable but sleeps with it every night- 2L/[employer]. Using metered inhaler once daily. Joined "[employer]" exercise program. Mentions burning occipital pain and I suggested she look into evaluation of her cervical spine. Complains of easy bruising without blood. On baby aspirin.  09/23/12-66 yoF smoker followed for COPD, tobacco dependence and cough.  ACUTE VISIT: Increased SOB than normal; cough. (started feeling bad last week-was given Predn taper and Doxy RX) not feeling any better. Head gets "full" after coughing so much and able to small amount of mucus up. She has not felt well since December. In the last week we had called in prednisone and doxycycline which ended today without benefit. Short of breath, chest congestion, dry cough, full in  head, ears and nose. No fever now but feels weak. She only admits to smoking 2 or 3 cigarettes daily but that is still  CT chest 10/08/11 IMPRESSION:  No evidence of pulmonary embolism or other acute findings. Stable  emphysematous lung disease.  Original Report Authenticated By: Reola CalkinsGLENN T. YAMAGATA, M.D.   01/12/13- [age] yoF smoker followed for COPD, tobacco dependence and cough.  FOLLOWS FOR: feeling better since 09/2012 visit; Denies any wheezing or SOB. States allergies are flaring up-mowed the yard on Tuesday. Eyes burn and itch, blamed on pollen No real change in her chest or shortness of breath with exertion. Felt much better after Depo-Medrol last visit. Using oxygen only to sleep. Using Combivent inhaler one time daily.and Symbicort twice daily. Exercises at gym 4 days per week. Complains that calves tender and ache. [facility] did peripheral vascular evaluation, negative. Echocardiogram 10/06/2011-grade 2 diastolic dysfunction CXR 09/23/12 IMPRESSION:  COPD/chronic changes. No active cardiopulmonary disease.  Original Report Authenticated By: Charlett NoseKevin Dover, M.D.  03/20/13-  [age] yoF smoker followed for COPD, tobacco dependence and cough ACUTE VISIT: PT states she is unable to breath that she cant even get in the shower mowed yard-started having increased SOB since then-got worse over the weeked. Continues to use O2, nebulizer, and inhalers-slight increase in use.  Still smoking against repeated counseling Ok at baseline until riding mower dusty 6 days ago. Progressive SOB through week, clear mucus, scant. Needs to stay on O2 more of time. Using neb 1-3x/ day.  07/13/13- 67 yoF smoker followed for COPD, tobacco dependence and cough FOLLOWS FOR: having cough(started feeling bad on Sunday). SOB and wheezing. Increased shortness of breath with dyspnea on exertion especially in the last 4 days. Doesn't feel well. Cough interfering with sleep in recliner this week. Might have low-grade fever. Sore throat last  week. Unfortunately still smokes despite counseling. Has been going to [employer] for exercise and has usually felt comfortable off of oxygen during the daytime until this week.  01/16/14- 67 yoF smoker followed for COPD, tobacco dependence and cough FOLLOWS FOR:  Breathing doing well.  Some days better than others Complains of neck pain -Incidental.  Took left over prednisone in early March for her breathing, none since. Chronic cough and dyspnea without chest pain or discolored sputum.  03/12/14- 67 yoF smoker followed for COPD, tobacco dependence and cough ACUTE VISIT:  Increased sob, tightness in chest and some cough x1 week.  On antibiotics 6 days no relief 2 weks ago had sore throat then nose blowing, increased SOB. Took left-over prednisone. PCP gave levaquin 750 mg day 6/10, depomedrol. Has another prednisone taper to take if needed. L calf hurts with standing.  O2 2L CXR 01/16/14 IMPRESSION:  No evidence of acute cardiopulmonary disease.  Electronically Signed  By: Charline Bills M.D.  On: 01/16/2014 16:47  06/05/14- 69 yoF smoker followed for COPD, tobacco dependence and cough FOLLOWS FOR: Pt c/o increase in SOB, intermittent cough with some mucus production x 2days. Pt denies CP/tightness and f/c/s. Pt states she has been using her flutter valve to help with the chest congestion.  And admits to not feeling well for 2-3 weeks with increase chest tightness gradually worse. She took amoxicillin and prednisone we have given her to hold, but did not help. Breathing is not disturbing her sleep. Little sputum, nothing purulent and no fever or chest pain. Using nebulizer twice daily. Likes Symbicort. Albuterol substituted for Combivent because of insurance.  Review of Systems-see HPI Constitutional:   No-   weight loss, night sweats, fevers, chills, fatigue, lassitude. HEENT:   No-  headaches, difficulty swallowing, tooth/dental problems, sore throat,       No- sneezing, itching,  no-ear ache, +nasal congestion, post nasal drip,  CV:  No- chest pain, orthopnea, PND, swelling in lower extremities, anasarca, dizziness, palpitations.  Resp: Per HPI-     +shortness of breath with exertion or at rest.              +  productive cough,  +-productive cough,  No-  coughing up of blood.              No-  change in color of mucus.  + wheezing.   Skin: No-   rash or lesions. GI:  No-   heartburn, indigestion, abdominal pain, nausea, vomiting,  GU: MS:  + neck pain w/o radicular symptoms Neuro-  Psych:  No- change in mood or affect. depression or anxiety.  No memory loss.   Objective:   Physical Exam  General- Alert, Oriented, Affect-not anxious, Distress- none acute, conversational on O2,  Skin- no rash. + Feels warm Lymphadenopathy- none Head- atraumatic            Eyes- Gross vision intact, PERRLA, conjunctivae clear secretions  Ears- Hearing, canals-normal            Nose- + steady sniffing and turbinate edema, no-Septal dev,  polyps, erosion, perforation. .             Throat- Mallampati II , mucosa clear , drainage- none, tonsils- atrophic.   Neck-  trachea midline, no stridor , thyroid nl, carotid no bruit.+ cervical kyphosis Chest - symmetrical excursion , unlabored           Heart/CV- RRR , no murmur , no gallop  , no rub, nl s1 s2                           - JVD- none , edema-none, stasis changes- none, varices- none           Lung- distant , wheeze+, + Shortn sentences, +cough- dry , dullness-none, rub- none           Chest wall-  Abd- Br/ Gen/ Rectal- Not done, not indicated Extrem- +wheelchair. Neuro- grossly intact to observation And ainand in and

## 2014-06-05 NOTE — Assessment & Plan Note (Signed)
Still smokes at least a couple of cigarettes daily-discussed with her again

## 2014-06-05 NOTE — Telephone Encounter (Signed)
Spoke with the pt  She is c/o increased SOB, wheezing and non prod cough for the past 2 wks  She had pred taper and amox 500 on hand that CD[facility] gave her back in June and took this with no relief She is asking for appt but nothing open  Please advise, thanks! Last ov here 03/12/14  Ov pending 07/19/14  No Known Allergies Current Outpatient Prescriptions on File Prior to Visit  Medication Sig Dispense Refill  . albuterol (PROVENTIL HFA;VENTOLIN HFA) 108 (90 BASE) MCG/ACT inhaler Inhale 1-2 puffs into the lungs every 6 (six) hours as needed for wheezing or shortness of breath.  1 Inhaler  6  . ALPRAZolam (XANAX) 0.5 MG tablet TAKE ONE TABLET BY MOUTH THREE TIMES DAILY AS NEEDED FOR ANXIETY OR SLEEP.  60 tablet  5  . amoxicillin (AMOXIL) 500 MG tablet Take 1 tablet (500 mg total) by mouth 2 (two) times daily.  14 tablet  0  . aspirin 81 MG tablet Take 81 mg by mouth daily.        . budesonide-formoterol (SYMBICORT) 160-4.5 MCG/ACT inhaler 2 puffs then rinse mouth, twice daily  1 Inhaler  prn  . ipratropium-albuterol (DUONEB) 0.5-2.5 (3) MG/3ML SOLN Take 3 mLs by nebulization 2 (two) times daily. DX 496  180 mL  11  . levofloxacin (LEVAQUIN) 750 MG tablet Take 750 mg by mouth daily.      Marland Kitchen lisinopril (PRINIVIL,ZESTRIL) 20 MG tablet Take 20 mg by mouth daily.      . predniSONE (DELTASONE) 10 MG tablet 4 X 2 DAYS, 3 X 2 DAYS, 2 X 2 DAYS, 1 X 2 DAYS  20 tablet  0  . [DISCONTINUED] amLODipine (NORVASC) 10 MG tablet Take 1 tablet (10 mg total) by mouth daily.  30 tablet  3  . [DISCONTINUED] pantoprazole (PROTONIX) 40 MG tablet Take 1 tablet (40 mg total) by mouth 2 (two) times daily before a meal.  60 tablet  3   No current facility-administered medications on file prior to visit.

## 2014-06-05 NOTE — Assessment & Plan Note (Signed)
Acute bronchitic exacerbation. She asks about ragweed pollen but this would be more likely a viral infection. Plan-nebulizer treatment Xopenex, Depo-Medrol, longer prednisone taper from 60 mg, keep October appointment

## 2014-06-05 NOTE — Patient Instructions (Addendum)
Neb xop 0.63  Depo 80  Script sent for prednisone taper  Script printed for cough syrup  We will see how you find you do with the albuterol rescue inhaler for now, and change if we need to.

## 2014-07-17 ENCOUNTER — Ambulatory Visit: Payer: Medicare HMO | Admitting: Internal Medicine

## 2014-07-19 ENCOUNTER — Encounter: Payer: Self-pay | Admitting: Podiatry

## 2014-07-19 ENCOUNTER — Ambulatory Visit (INDEPENDENT_AMBULATORY_CARE_PROVIDER_SITE_OTHER): Payer: Commercial Managed Care - HMO

## 2014-07-19 ENCOUNTER — Encounter: Payer: Self-pay | Admitting: Internal Medicine

## 2014-07-19 ENCOUNTER — Ambulatory Visit (INDEPENDENT_AMBULATORY_CARE_PROVIDER_SITE_OTHER): Payer: Medicare HMO | Admitting: Internal Medicine

## 2014-07-19 ENCOUNTER — Ambulatory Visit (INDEPENDENT_AMBULATORY_CARE_PROVIDER_SITE_OTHER): Payer: Commercial Managed Care - HMO | Admitting: Podiatry

## 2014-07-19 VITALS — BP 132/70 | HR 104 | Ht 60.0 in | Wt 120.2 lb

## 2014-07-19 VITALS — BP 117/62 | HR 86 | Resp 16

## 2014-07-19 DIAGNOSIS — J439 Emphysema, unspecified: Secondary | ICD-10-CM

## 2014-07-19 DIAGNOSIS — M898X9 Other specified disorders of bone, unspecified site: Secondary | ICD-10-CM

## 2014-07-19 DIAGNOSIS — F17200 Nicotine dependence, unspecified, uncomplicated: Secondary | ICD-10-CM

## 2014-07-19 DIAGNOSIS — M779 Enthesopathy, unspecified: Secondary | ICD-10-CM

## 2014-07-19 DIAGNOSIS — Z23 Encounter for immunization: Secondary | ICD-10-CM

## 2014-07-19 DIAGNOSIS — Z720 Tobacco use: Secondary | ICD-10-CM

## 2014-07-19 MED ORDER — PREDNISONE 10 MG PO TABS: ORAL_TABLET | ORAL | Status: DC

## 2014-07-19 MED ORDER — AMOXICILLIN-POT CLAVULANATE 875-125 MG PO TABS: 1.0000 | ORAL_TABLET | Freq: Two times a day (BID) | ORAL | Status: AC

## 2014-07-19 MED ORDER — ALBUTEROL SULFATE HFA 108 (90 BASE) MCG/ACT IN AERS: INHALATION_SPRAY | RESPIRATORY_TRACT | Status: DC

## 2014-07-19 MED ORDER — TRIAMCINOLONE ACETONIDE 10 MG/ML IJ SUSP
10.0000 mg | Freq: Once | INTRAMUSCULAR | Status: AC
  Administered 2014-07-19: 10 mg

## 2014-07-19 NOTE — Patient Instructions (Addendum)
Script printed to try Proair as a rescue inhaler  Scripts sent for augmentin and prednisone to keep on hand  Sample x 3 Symbicort 160   2 puffs then rinse mouth, twice daily- maintenance inhaler  Flu vax

## 2014-07-19 NOTE — Progress Notes (Signed)
   Subjective:    Patient ID: [name], female    DOB: [birth year], 68 y.o.   MRN: [id number]  HPI Comments: "I have a place in between my toes"  Patient c/o aching, throbbing between 4th and 5th toes right foot for about 1 year. The area is moist, macerated and has a tiny hole on 5th toe interdigitally. Some redness. She has been using lamisil and mupirocin ointment. Not better.     Review of Systems  HENT: Positive for hearing loss.   Respiratory: Positive for shortness of breath.   Hematological: Bruises/bleeds easily.  All other systems reviewed and are negative.      Objective:   Physical Exam        Assessment & Plan:

## 2014-07-19 NOTE — Progress Notes (Signed)
Subjective:     Patient ID: [name], female   DOB: 1946-01-23, 68 y.o.   MRN: [id number]  Toe Pain    patient presents stating I have had a breakdown of tissue between the fourth and fifth toes on my left foot for about 1 year. I had tried pads and antifungals which have not helped and it is sore with shoe gear   Review of Systems  All other systems reviewed and are negative.      Objective:   Physical Exam  Vitals reviewed. Constitutional: She is oriented to person, place, and time.  Cardiovascular: Intact distal pulses.   Musculoskeletal: Normal range of motion.  Neurological: She is oriented to person, place, and time.  Skin: Skin is warm.   neurovascular status found to be intact with muscle strength adequate range of motion within normal limits subtalar and midtarsal joint. Patient is noted to have area of irritation between the fourth and fifth toes on the left foot with inflammation and fluid buildup noted and keratotic tissue noted. Digits are well perfused patient's well oriented 3 with no equinus condition noted     Assessment:     Soft tissue lesion fourth interspace left with inflammatory capsulitis and keratotic tissue formation    Plan:     H&P was performed and today I injected the metacarpophalangeal capsule fourth with 5 mg dexamethasone Kenalog 10 mg Xylocaine and after appropriate numbness debrided lesions and applied dressing. We will see her back and I discussed with her she may ultimately require digital arthroplasty with soft tissue syndactylization procedure

## 2014-07-19 NOTE — Progress Notes (Signed)
Patient ID: Kaitlyn MoralesFrances L Ibarra, female    DOB: 1945/10/16, 68 y.o.   MRN: [id number]  HPI [age] yoF smoker followed for COPD, tobacco dependence and cough. [employer] EMR reviewed for conversion to Epic.Has had extensive efforts at smoking counseling (husband died of tobacco induced lung cancer).  Comes today with increased cough since March 2, with concerns of relation to mold abatement being done in home. Refrigerator line leaked, later floor buckled and on removal, mold found beneath flooring. Thinks she may have coughed more since recent water leak onto kitchen floor. Denies fever, sore throat or obvious infection. Denies reflux. Has been more wheezey and short of breath Using her Advair and using Combivent 3 x/ day.   06/23/11- 64 yoF smoker followed for COPD, tobacco dependence and cough. She has had additional stress recently, son just had an MI and coronary stent. She has not tried to stop smoking despite counseling. She found Symbicort to be no better than Advair. Changing from a dry powder inhaler had no effect on her cough. Using rescue inhaler a little more. New complaint: Feels tight through the neck and unable to relax her neck and shoulders. A neck brace helps.  08/14/11 - 64 yoF smoker followed for COPD, tobacco dependence and cough. Acute visit- she is still smoking against advice. Has had flu vaccine. Now complains of acute illness for the past week. Chest tight, short of breath. Cough was productive but now is dry. Denies fever, chest pain blood or palpitation. 5 days ago and she accepted a prescription from us for Z-Pak, just finished today, but she says "it never helps". She is particularly concerned about recurrence of mucus plugging, since she understands that was the problem related to a sustained exacerbation with hospital stay in the past. She is using her rescue inhaler every 4 hours. CXR- 06/26/11- stable COPD, NAD.  10/01/11- 64 yoF smoker followed for COPD, tobacco dependence  and cough.  Increased SOB with walking or at rest-gotten worse since Christmas; prior to this was sick 08-14-2011-never regrouped from this. Wakes up at night gasping for air-has had to use rescue inhaler a few times when this happens. She finished prednisone and antibiotic that we had called in December 24 and has now been off of those for about 2 weeks. Stays short of breath as described. We discussed pending change of Combivent to the new delivery device. She is anxious about any change from familiar. Using Xanax about once daily. PFT- 07/07/11- severe obstructive airways disease with response to bronchodilator, hyperinflation, diffusion moderately reduced. FEV1 0.73/38%, FEV1/FVC 0.37, DLCO 45%. Loop configuration is emphysema.  10/21/11- 64 yoF smoker followed for COPD, tobacco dependence and cough. Post hospital-  Pt states increase SOB upon activity ,wheezing,denies any cough.     Daughter [name] is here Hospitalized in January 17 through 10/12/2011. She got progressively worse after last visit here and went to the emergency room. Treated for COPD exacerbation. Now she still is not quite back to her baseline but says she has not smoked since leaving the hospital. I picked on this and try to reinforce it with support. Especially short of breath with any exertion. She is wearing oxygen but once the office so she can continue to live alone and do her own yard work. October I could not, as we could get her back off. Lisinopril was changed to Norvasc and I discussed potential for lisinopril to be associated with dry cough and some people. She is tapering off of prednisone. Echocardiogram  reviewed and discussed-pulmonary artery systolic   12/03/11- 64 yoF smoker followed for COPD, tobacco dependence and cough. She  still needs oxygen sometimes during the day. We discussed oxygen delivery devices. Little cough or phlegm. Had Pneumovax.  01/25/12- 64 yoF smoker followed for COPD, tobacco dependence and  cough.  Daughter here Acute visit-SOB getting worse in the past week; Prednisone was called in last week(took last pill this am); had head congestion, ears popping last week-called in ABX/ augmentin-not working. Friday called and was told to go to ER-pt did not go. Coughing-productive-looks like slight yellow in color and thick-feels better once getting it up; having to wear O2 several times during the day.   04/12/12-  64 yoF smoker followed for COPD, tobacco dependence and cough.   States about the same. c/o difficulty breathing and using oxygen more often due to the hot weather. Also occassional cough and wheezing.  COPD assessment test (CAT) score 20/40 She has a jury summons which we discussed. She is oxygen dependent and cannot count on her portable oxygen to last the length of a jury obligation. She stays indoors to avoid the heat and humidity, getting others to mow her yard.   07/14/12- 64 yoF smoker followed for COPD, tobacco dependence and cough.   Good and bad days with breathing; no more than usual SOB. COPD assessment test (CAT) score 15/40 Has had flu shot. Weather changes are bothering her. Avoids using her oxygen in the house if she is comfortable but sleeps with it every night- 2L/[employer]. Using metered inhaler once daily. Joined "[employer]" exercise program. Mentions burning occipital pain and I suggested she look into evaluation of her cervical spine. Complains of easy bruising without blood. On baby aspirin.  09/23/12-66 yoF smoker followed for COPD, tobacco dependence and cough.  ACUTE VISIT: Increased SOB than normal; cough. (started feeling bad last week-was given Predn taper and Doxy RX) not feeling any better. Head gets "full" after coughing so much and able to small amount of mucus up. She has not felt well since December. In the last week we had called in prednisone and doxycycline which ended today without benefit. Short of breath, chest congestion, dry cough, full in  head, ears and nose. No fever now but feels weak. She only admits to smoking 2 or 3 cigarettes daily but that is still  CT chest 10/08/11 IMPRESSION:  No evidence of pulmonary embolism or other acute findings. Stable  emphysematous lung disease.  Original Report Authenticated By: Reola CalkinsGLENN T. YAMAGATA, M.D.   01/12/13- [age] yoF smoker followed for COPD, tobacco dependence and cough.  FOLLOWS FOR: feeling better since 09/2012 visit; Denies any wheezing or SOB. States allergies are flaring up-mowed the yard on Tuesday. Eyes burn and itch, blamed on pollen No real change in her chest or shortness of breath with exertion. Felt much better after Depo-Medrol last visit. Using oxygen only to sleep. Using Combivent inhaler one time daily.and Symbicort twice daily. Exercises at gym 4 days per week. Complains that calves tender and ache. [facility] did peripheral vascular evaluation, negative. Echocardiogram 10/06/2011-grade 2 diastolic dysfunction CXR 09/23/12 IMPRESSION:  COPD/chronic changes. No active cardiopulmonary disease.  Original Report Authenticated By: Charlett NoseKevin Dover, M.D.  03/20/13-  [age] yoF smoker followed for COPD, tobacco dependence and cough ACUTE VISIT: PT states she is unable to breath that she cant even get in the shower mowed yard-started having increased SOB since then-got worse over the weeked. Continues to use O2, nebulizer, and inhalers-slight increase in use.  Still smoking against repeated counseling Ok at baseline until riding mower dusty 6 days ago. Progressive SOB through week, clear mucus, scant. Needs to stay on O2 more of time. Using neb 1-3x/ day.  07/13/13- 67 yoF smoker followed for COPD, tobacco dependence and cough FOLLOWS FOR: having cough(started feeling bad on Sunday). SOB and wheezing. Increased shortness of breath with dyspnea on exertion especially in the last 4 days. Doesn't feel well. Cough interfering with sleep in recliner this week. Might have low-grade fever. Sore throat last  week. Unfortunately still smokes despite counseling. Has been going to [employer] for exercise and has usually felt comfortable off of oxygen during the daytime until this week.  01/16/14- 67 yoF smoker followed for COPD, tobacco dependence and cough FOLLOWS FOR:  Breathing doing well.  Some days better than others Complains of neck pain -Incidental.  Took left over prednisone in early March for her breathing, none since. Chronic cough and dyspnea without chest pain or discolored sputum.  03/12/14- 67 yoF smoker followed for COPD, tobacco dependence and cough ACUTE VISIT:  Increased sob, tightness in chest and some cough x1 week.  On antibiotics 6 days no relief 2 weks ago had sore throat then nose blowing, increased SOB. Took left-over prednisone. PCP gave levaquin 750 mg day 6/10, depomedrol. Has another prednisone taper to take if needed. L calf hurts with standing.  O2 2L CXR 01/16/14 IMPRESSION:  No evidence of acute cardiopulmonary disease.  Electronically Signed  By: Charline BillsSriyesh Krishnan M.D.  On: 01/16/2014 16:47  06/05/14- [age] yoF smoker followed for COPD, tobacco dependence and cough FOLLOWS FOR: Pt c/o increase in SOB, intermittent cough with some mucus production x 2days. Pt denies CP/tightness and f/c/s. Pt states she has been using her flutter valve to help with the chest congestion.  And admits to not feeling well for 2-3 weeks with increase chest tightness gradually worse. She took amoxicillin and prednisone we have given her to hold, but did not help. Breathing is not disturbing her sleep. Little sputum, nothing purulent and no fever or chest pain. Using nebulizer twice daily. Likes Symbicort. Albuterol substituted for Combivent because of insurance.  07/19/14- 68 yoF smoker followed for COPD, tobacco dependence and cough FOLLOW FOR: COPD  She still has cough and sob. She denies chest congestion. She has not been using the flutter valve. Concerned about relative cost of  albuterol inhaler and Combivent inhaler-prefers Combivent.  Review of Systems-see HPI Constitutional:   No-   weight loss, night sweats, fevers, chills, fatigue, lassitude. HEENT:   No-  headaches, difficulty swallowing, tooth/dental problems, sore throat,       No- sneezing, itching, no-ear ache, +nasal congestion, post nasal drip,  CV:  No- chest pain, orthopnea, PND, swelling in lower extremities, anasarca, dizziness, palpitations.  Resp: Per HPI-     +shortness of breath with exertion or at rest.              +  productive cough,  +-productive cough,  No-  coughing up of blood.              No-  change in color of mucus.  + wheezing.   Skin: No-   rash or lesions. GI:  No-   heartburn, indigestion, abdominal pain, nausea, vomiting,  GU: MS:  + neck pain w/o radicular symptoms Neuro-  Psych:  No- change in mood or affect. depression or anxiety.  No memory loss.   Objective:   Physical Exam  General-  Alert, Oriented, Affect-not anxious, Distress- none acute, conversational on O2,  Skin- no rash. + Feels warm Lymphadenopathy- none Head- atraumatic            Eyes- Gross vision intact, PERRLA, conjunctivae clear secretions            Ears- Hearing, canals-normal            Nose- + steady sniffing and turbinate edema, no-Septal dev,  polyps, erosion, perforation. .             Throat- Mallampati II , mucosa clear , drainage- none, tonsils- atrophic.   Neck-  trachea midline, no stridor , thyroid nl, carotid no bruit.+ cervical kyphosis Chest - symmetrical excursion , unlabored           Heart/CV- RRR , no murmur , no gallop  , no rub, nl s1 s2                           - JVD- none , edema-none, stasis changes- none, varices- none           Lung- distant , wheeze-none, cough+ dry , dullness-none, rub- none           Chest wall-  Abd- Br/ Gen/ Rectal- Not done, not indicated Extrem- +wheelchair. Neuro- grossly intact to observation And ainand in and

## 2014-07-22 ENCOUNTER — Encounter: Payer: Self-pay | Admitting: Internal Medicine

## 2014-07-22 NOTE — Assessment & Plan Note (Signed)
counseling 

## 2014-07-22 NOTE — Assessment & Plan Note (Signed)
No real chance of improvement, especially when she continues to smoke some despite counseling. Educated about medications and choices. Plan-prescriptions to hold for antibiotic and prednisone as discussed. Refill rescue inhaler, samples of Symbicort, flu vaccine

## 2014-08-02 ENCOUNTER — Other Ambulatory Visit: Payer: Self-pay | Admitting: *Deleted

## 2014-08-02 MED ORDER — BUDESONIDE-FORMOTEROL FUMARATE 160-4.5 MCG/ACT IN AERO: INHALATION_SPRAY | RESPIRATORY_TRACT | Status: DC

## 2014-08-07 ENCOUNTER — Telehealth: Payer: Self-pay | Admitting: Internal Medicine

## 2014-08-09 NOTE — Telephone Encounter (Signed)
Forms and message on CY's cart.

## 2014-08-10 NOTE — Telephone Encounter (Signed)
Pt aware that I have faxed patient assistance forms back to [facility] and me. I also made copy for our records and mailed original to patient. Nothing more needed at this time.

## 2014-08-31 ENCOUNTER — Other Ambulatory Visit: Payer: Self-pay | Admitting: Internal Medicine

## 2014-08-31 MED ORDER — IPRATROPIUM-ALBUTEROL 20-100 MCG/ACT IN AERS
1.0000 | INHALATION_SPRAY | Freq: Four times a day (QID) | RESPIRATORY_TRACT | Status: DC
Start: 2014-08-31 — End: 2014-09-05

## 2014-09-05 ENCOUNTER — Other Ambulatory Visit: Payer: Self-pay | Admitting: Internal Medicine

## 2014-09-05 MED ORDER — IPRATROPIUM-ALBUTEROL 20-100 MCG/ACT IN AERS: 1.0000 | INHALATION_SPRAY | Freq: Four times a day (QID) | RESPIRATORY_TRACT | Status: DC

## 2014-09-06 ENCOUNTER — Encounter: Payer: Self-pay | Admitting: Internal Medicine

## 2014-09-06 ENCOUNTER — Telehealth: Payer: Self-pay | Admitting: Internal Medicine

## 2014-09-06 ENCOUNTER — Ambulatory Visit (INDEPENDENT_AMBULATORY_CARE_PROVIDER_SITE_OTHER): Payer: Medicare HMO | Admitting: Internal Medicine

## 2014-09-06 VITALS — BP 138/86 | HR 93 | Ht 60.0 in | Wt 120.6 lb

## 2014-09-06 DIAGNOSIS — J441 Chronic obstructive pulmonary disease with (acute) exacerbation: Secondary | ICD-10-CM

## 2014-09-06 MED ORDER — METHYLPREDNISOLONE ACETATE 80 MG/ML IJ SUSP
80.0000 mg | Freq: Once | INTRAMUSCULAR | Status: AC
  Administered 2014-09-06: 80 mg via INTRAMUSCULAR

## 2014-09-06 MED ORDER — AMOXICILLIN-POT CLAVULANATE 875-125 MG PO TABS: 1.0000 | ORAL_TABLET | Freq: Two times a day (BID) | ORAL | Status: DC

## 2014-09-06 NOTE — Telephone Encounter (Signed)
Per CY-have patient come in for acute visit and get Depo. Pt is aware and has been placed in 4:30pm slot; pt is on her way to the office now. Nothing more needed at this time.

## 2014-09-06 NOTE — Telephone Encounter (Signed)
Called and informed pt that we will call when Dr. Maple HudsonYoung responds. Pt verbalized understanding.

## 2014-09-06 NOTE — Progress Notes (Signed)
Patient ID: [name], female    DOB: 1945/10/16, 68 y.o.   MRN: [id number]  HPI 68 yoF smoker followed for COPD, tobacco dependence and cough. [employer] EMR reviewed for conversion to Epic.Has had extensive efforts at smoking counseling (husband died of tobacco induced lung cancer).  Comes today with increased cough since March 2, with concerns of relation to mold abatement being done in home. Refrigerator line leaked, later floor buckled and on removal, mold found beneath flooring. Thinks she may have coughed more since recent water leak onto kitchen floor. Denies fever, sore throat or obvious infection. Denies reflux. Has been more wheezey and short of breath Using her Advair and using Combivent 3 x/ day.   06/23/11- 68 yoF smoker followed for COPD, tobacco dependence and cough. She has had additional stress recently, son just had an MI and coronary stent. She has not tried to stop smoking despite counseling. She found Symbicort to be no better than Advair. Changing from a dry powder inhaler had no effect on her cough. Using rescue inhaler a little more. New complaint: Feels tight through the neck and unable to relax her neck and shoulders. A neck brace helps.  08/14/11 - 68 yoF smoker followed for COPD, tobacco dependence and cough. Acute visit- she is still smoking against advice. Has had flu vaccine. Now complains of acute illness for the past week. Chest tight, short of breath. Cough was productive but now is dry. Denies fever, chest pain blood or palpitation. 5 days ago and she accepted a prescription from us for Z-Pak, just finished today, but she says "it never helps". She is particularly concerned about recurrence of mucus plugging, since she understands that was the problem related to a sustained exacerbation with hospital stay in the past. She is using her rescue inhaler every 4 hours. CXR- 06/26/11- stable COPD, NAD.  10/01/11- 64 yoF smoker followed for COPD, tobacco dependence  and cough.  Increased SOB with walking or at rest-gotten worse since Christmas; prior to this was sick 08-14-2011-never regrouped from this. Wakes up at night gasping for air-has had to use rescue inhaler a few times when this happens. She finished prednisone and antibiotic that we had called in December 24 and has now been off of those for about 2 weeks. Stays short of breath as described. We discussed pending change of Combivent to the new delivery device. She is anxious about any change from familiar. Using Xanax about once daily. PFT- 07/07/11- severe obstructive airways disease with response to bronchodilator, hyperinflation, diffusion moderately reduced. FEV1 0.73/38%, FEV1/FVC 0.37, DLCO 45%. Loop configuration is emphysema.  10/21/11- 64 yoF smoker followed for COPD, tobacco dependence and cough. Post hospital-  Pt states increase SOB upon activity ,wheezing,denies any cough.     Daughter [name] is here Hospitalized in January 17 through 10/12/2011. She got progressively worse after last visit here and went to the emergency room. Treated for COPD exacerbation. Now she still is not quite back to her baseline but says she has not smoked since leaving the hospital. I picked on this and try to reinforce it with support. Especially short of breath with any exertion. She is wearing oxygen but once the office so she can continue to live alone and do her own yard work. October I could not, as we could get her back off. Lisinopril was changed to Norvasc and I discussed potential for lisinopril to be associated with dry cough and some people. She is tapering off of prednisone. Echocardiogram  reviewed and discussed-pulmonary artery systolic   12/03/11- 68 yoF smoker followed for COPD, tobacco dependence and cough. She  still needs oxygen sometimes during the day. We discussed oxygen delivery devices. Little cough or phlegm. Had Pneumovax.  01/25/12- 68 yoF smoker followed for COPD, tobacco dependence and  cough.  Daughter here Acute visit-SOB getting worse in the past week; Prednisone was called in last week(took last pill this am); had head congestion, ears popping last week-called in ABX/ augmentin-not working. Friday called and was told to go to ER-pt did not go. Coughing-productive-looks like slight yellow in color and thick-feels better once getting it up; having to wear O2 several times during the day.   04/12/12-  68 yoF smoker followed for COPD, tobacco dependence and cough.   States about the same. c/o difficulty breathing and using oxygen more often due to the hot weather. Also occassional cough and wheezing.  COPD assessment test (CAT) score 20/40 She has a jury summons which we discussed. She is oxygen dependent and cannot count on her portable oxygen to last the length of a jury obligation. She stays indoors to avoid the heat and humidity, getting others to mow her yard.   07/14/12- 68 yoF smoker followed for COPD, tobacco dependence and cough.   Good and bad days with breathing; no more than usual SOB. COPD assessment test (CAT) score 15/40 Has had flu shot. Weather changes are bothering her. Avoids using her oxygen in the house if she is comfortable but sleeps with it every night- 2L/[employer]. Using metered inhaler once daily. Joined "[employer]" exercise program. Mentions burning occipital pain and I suggested she look into evaluation of her cervical spine. Complains of easy bruising without blood. On baby aspirin.  09/23/12-68 yoF smoker followed for COPD, tobacco dependence and cough.  ACUTE VISIT: Increased SOB than normal; cough. (started feeling bad last week-was given Predn taper and Doxy RX) not feeling any better. Head gets "full" after coughing so much and able to small amount of mucus up. She has not felt well since December. In the last week we had called in prednisone and doxycycline which ended today without benefit. Short of breath, chest congestion, dry cough, full in  head, ears and nose. No fever now but feels weak. She only admits to smoking 2 or 3 cigarettes daily but that is still  CT chest 10/08/11 IMPRESSION:  No evidence of pulmonary embolism or other acute findings. Stable  emphysematous lung disease.  Original Report Authenticated By: Reola CalkinsGLENN T. YAMAGATA, M.D.   01/12/13- [age] yoF smoker followed for COPD, tobacco dependence and cough.  FOLLOWS FOR: feeling better since 09/2012 visit; Denies any wheezing or SOB. States allergies are flaring up-mowed the yard on Tuesday. Eyes burn and itch, blamed on pollen No real change in her chest or shortness of breath with exertion. Felt much better after Depo-Medrol last visit. Using oxygen only to sleep. Using Combivent inhaler one time daily.and Symbicort twice daily. Exercises at gym 4 days per week. Complains that calves tender and ache. [facility] did peripheral vascular evaluation, negative. Echocardiogram 10/06/2011-grade 2 diastolic dysfunction CXR 09/23/12 IMPRESSION:  COPD/chronic changes. No active cardiopulmonary disease.  Original Report Authenticated By: Charlett NoseKevin Dover, M.D.  03/20/13-  [age] yoF smoker followed for COPD, tobacco dependence and cough ACUTE VISIT: PT states she is unable to breath that she cant even get in the shower mowed yard-started having increased SOB since then-got worse over the weeked. Continues to use O2, nebulizer, and inhalers-slight increase in use.  Still smoking against repeated counseling Ok at baseline until riding mower dusty 6 days ago. Progressive SOB through week, clear mucus, scant. Needs to stay on O2 more of time. Using neb 1-3x/ day.  07/13/13- 67 yoF smoker followed for COPD, tobacco dependence and cough FOLLOWS FOR: having cough(started feeling bad on Sunday). SOB and wheezing. Increased shortness of breath with dyspnea on exertion especially in the last 4 days. Doesn't feel well. Cough interfering with sleep in recliner this week. Might have low-grade fever. Sore throat last  week. Unfortunately still smokes despite counseling. Has been going to [employer] for exercise and has usually felt comfortable off of oxygen during the daytime until this week.  01/16/14- 67 yoF smoker followed for COPD, tobacco dependence and cough FOLLOWS FOR:  Breathing doing well.  Some days better than others Complains of neck pain -Incidental.  Took left over prednisone in early March for her breathing, none since. Chronic cough and dyspnea without chest pain or discolored sputum.  03/12/14- 67 yoF smoker followed for COPD, tobacco dependence and cough ACUTE VISIT:  Increased sob, tightness in chest and some cough x1 week.  On antibiotics 6 days no relief 2 weks ago had sore throat then nose blowing, increased SOB. Took left-over prednisone. PCP gave levaquin 750 mg day 6/10, depomedrol. Has another prednisone taper to take if needed. L calf hurts with standing.  O2 2L CXR 01/16/14 IMPRESSION:  No evidence of acute cardiopulmonary disease.  Electronically Signed  By: Charline BillsSriyesh Krishnan M.D.  On: 01/16/2014 16:47  06/05/14- [age] yoF smoker followed for COPD, tobacco dependence and cough FOLLOWS FOR: Pt c/o increase in SOB, intermittent cough with some mucus production x 2days. Pt denies CP/tightness and f/c/s. Pt states she has been using her flutter valve to help with the chest congestion.  And admits to not feeling well for 2-3 weeks with increase chest tightness gradually worse. She took amoxicillin and prednisone we have given her to hold, but did not help. Breathing is not disturbing her sleep. Little sputum, nothing purulent and no fever or chest pain. Using nebulizer twice daily. Likes Symbicort. Albuterol substituted for Combivent because of insurance.  07/19/14- 68 yoF smoker followed for COPD, tobacco dependence and cough FOLLOW FOR: COPD  She still has cough and sob. She denies chest congestion. She has not been using the flutter valve. Concerned about relative cost of  albuterol inhaler and Combivent inhaler-prefers Combivent.  09/06/14- acute visit. Quick work in because she asked to come for Depo-Medrol injection.  Review of Systems-see HPI Constitutional:   No-   weight loss, night sweats, fevers, chills, fatigue, lassitude. HEENT:   No-  headaches, difficulty swallowing, tooth/dental problems, sore throat,       No- sneezing, itching, no-ear ache, +nasal congestion, post nasal drip,  CV:  No- chest pain, orthopnea, PND, swelling in lower extremities, anasarca, dizziness, palpitations.  Resp: Per HPI-     +shortness of breath with exertion or at rest.              +  productive cough,  +-productive cough,  No-  coughing up of blood.              No-  change in color of mucus.  + wheezing.   Skin: No-   rash or lesions. GI:  No-   heartburn, indigestion, abdominal pain, nausea, vomiting,  GU: MS:  + neck pain w/o radicular symptoms Neuro-  Psych:  No- change in mood or affect. depression  or anxiety.  No memory loss.   Objective:   Physical Exam  General- Alert, Oriented, Affect-not anxious, Distress- none acute, conversational on O2,  Skin- no rash. + Feels warm Lymphadenopathy- none Head- atraumatic            Eyes- Gross vision intact, PERRLA, conjunctivae clear secretions            Ears- Hearing, canals-normal            Nose- + steady sniffing and turbinate edema, no-Septal dev,  polyps, erosion, perforation. .             Throat- Mallampati II , mucosa clear , drainage- none, tonsils- atrophic.   Neck-  trachea midline, no stridor , thyroid nl, carotid no bruit.+ cervical kyphosis Chest - symmetrical excursion , unlabored           Heart/CV- RRR , no murmur , no gallop  , no rub, nl s1 s2                           - JVD- none , edema-none, stasis changes- none, varices- none           Lung- distant , wheeze-none, cough+ dry , dullness-none, rub- none           Chest wall-  Abd- Br/ Gen/ Rectal- Not done, not indicated Extrem-  +wheelchair. Neuro- grossly intact to observation And ainand in and

## 2014-09-06 NOTE — Assessment & Plan Note (Signed)
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 Remains dependent on oxygen and notices daily cough wheeze and dyspnea. Plan-Depo-Medrol. She also started herself on a prednisone taper and will complete that as well.

## 2014-09-06 NOTE — Telephone Encounter (Signed)
Spoke with pt, c/o sob Xseveral days. Worse when laying down, states she has a difficult time sleeping because of this.  Denies prod cough, chest tightness, fever.  Is requesting to be seen today for a steroid injection.   Last ov: 07/19/14 Next ov: 10/18/14  Dr. Maple HudsonYoung please advise.  Thanks   No Known Allergies Current Outpatient Prescriptions on File Prior to Visit  Medication Sig Dispense Refill  . albuterol (PROAIR HFA) 108 (90 BASE) MCG/ACT inhaler 2 puffs every 4 hours if needed 1 Inhaler prn  . ALPRAZolam (XANAX) 0.5 MG tablet TAKE ONE TABLET BY MOUTH THREE TIMES DAILY AS NEEDED FOR ANXIETY OR SLEEP. 60 tablet 5  . aspirin 81 MG tablet Take 81 mg by mouth daily.     . budesonide-formoterol (SYMBICORT) 160-4.5 MCG/ACT inhaler 2 puffs then rinse mouth, twice daily 1 Inhaler prn  . Ipratropium-Albuterol (COMBIVENT) 20-100 MCG/ACT AERS respimat Inhale 1 puff into the lungs every 6 (six) hours. 3 Inhaler 3  . ipratropium-albuterol (DUONEB) 0.5-2.5 (3) MG/3ML SOLN Take 3 mLs by nebulization 2 (two) times daily. DX 496 180 mL 11  . lisinopril (PRINIVIL,ZESTRIL) 20 MG tablet Take 20 mg by mouth daily.    . predniSONE (DELTASONE) 10 MG tablet 4 X 2 DAYS, 3 X 2 DAYS, 2 X 2 DAYS, 1 X 2 DAYS 20 tablet 0  . [DISCONTINUED] amLODipine (NORVASC) 10 MG tablet Take 1 tablet (10 mg total) by mouth daily. 30 tablet 3  . [DISCONTINUED] pantoprazole (PROTONIX) 40 MG tablet Take 1 tablet (40 mg total) by mouth 2 (two) times daily before a meal. 60 tablet 3   No current facility-administered medications on file prior to visit.

## 2014-09-06 NOTE — Patient Instructions (Signed)
Depo-Medrol 80 mg IM. Finish prednisone taper. Keep appointment.

## 2014-09-06 NOTE — Telephone Encounter (Signed)
[id number] returning call needs to know something soon

## 2014-09-10 ENCOUNTER — Telehealth: Payer: Self-pay | Admitting: Internal Medicine

## 2014-09-10 NOTE — Telephone Encounter (Signed)
Advised pt that we do not have any symbicort 160 samples.  Nothing further needed at this time.

## 2014-09-17 ENCOUNTER — Telehealth: Payer: Self-pay | Admitting: Internal Medicine

## 2014-09-17 NOTE — Telephone Encounter (Signed)
Called and spoke to pt. Pt states she was informed by [employer] that [place]'s DE[place] number was incorrect on pt assistance form and will not be able to process form. Pt states [employer] sent a letter to [employer] requesting information. Pt stated is requesting a symbicort sample to last her to the new year. Sample left up front for pick up. Pt aware.   Katie have you seen this form.

## 2014-09-26 NOTE — Telephone Encounter (Signed)
Documents are now scanned in EPIC-I was able to call [place] [employer] [place] [employer]-they state the patient needs a new application filled out (the company requires this every year on or after 09-04-14). She would have had to fill out another packet even if she was approved back in November 2015.   I spoke with patient. She was mailed the new forms from the company-she will bring the forms(with her portion filled out and signed) with her finical information to me this week. She is aware that I will have [place] sign the forms and fax all information back while she is in the office. I will obtain a copy for our records as well.

## 2014-10-04 NOTE — Telephone Encounter (Signed)
[facility] on 09-27-14; I have made copies for our records and mailed originals to patient. Pt will get notification from the company about approval/denial. Nothing more needed at this time.

## 2014-10-18 ENCOUNTER — Ambulatory Visit (INDEPENDENT_AMBULATORY_CARE_PROVIDER_SITE_OTHER): Payer: Medicare HMO | Admitting: Internal Medicine

## 2014-10-18 ENCOUNTER — Encounter: Payer: Self-pay | Admitting: Internal Medicine

## 2014-10-18 VITALS — BP 118/60 | HR 86 | Ht 60.0 in | Wt 121.4 lb

## 2014-10-18 DIAGNOSIS — F17200 Nicotine dependence, unspecified, uncomplicated: Secondary | ICD-10-CM

## 2014-10-18 DIAGNOSIS — Z720 Tobacco use: Secondary | ICD-10-CM

## 2014-10-18 DIAGNOSIS — J432 Centrilobular emphysema: Secondary | ICD-10-CM

## 2014-10-18 DIAGNOSIS — J9611 Chronic respiratory failure with hypoxia: Secondary | ICD-10-CM

## 2014-10-18 MED ORDER — PREDNISONE 10 MG PO TABS: ORAL_TABLET | ORAL | Status: DC

## 2014-10-18 MED ORDER — AMOXICILLIN-POT CLAVULANATE 500-125 MG PO TABS: ORAL_TABLET | ORAL | Status: DC

## 2014-10-18 NOTE — Progress Notes (Signed)
Patient ID: [name], female    DOB: 1945/10/16, 69 y.o.   MRN: [id number]  HPI [age] yoF smoker followed for COPD, tobacco dependence and cough. [employer] EMR reviewed for conversion to Epic.Has had extensive efforts at smoking counseling (husband died of tobacco induced lung cancer).  Comes today with increased cough since March 2, with concerns of relation to mold abatement being done in home. Refrigerator line leaked, later floor buckled and on removal, mold found beneath flooring. Thinks she may have coughed more since recent water leak onto kitchen floor. Denies fever, sore throat or obvious infection. Denies reflux. Has been more wheezey and short of breath Using her Advair and using Combivent 3 x/ day.   06/23/11- 69 yoF smoker followed for COPD, tobacco dependence and cough. She has had additional stress recently, son just had an MI and coronary stent. She has not tried to stop smoking despite counseling. She found Symbicort to be no better than Advair. Changing from a dry powder inhaler had no effect on her cough. Using rescue inhaler a little more. New complaint: Feels tight through the neck and unable to relax her neck and shoulders. A neck brace helps.  08/14/11 - 69 yoF smoker followed for COPD, tobacco dependence and cough. Acute visit- she is still smoking against advice. Has had flu vaccine. Now complains of acute illness for the past week. Chest tight, short of breath. Cough was productive but now is dry. Denies fever, chest pain blood or palpitation. 5 days ago and she accepted a prescription from us for Z-Pak, just finished today, but she says "it never helps". She is particularly concerned about recurrence of mucus plugging, since she understands that was the problem related to a sustained exacerbation with hospital stay in the past. She is using her rescue inhaler every 4 hours. CXR- 06/26/11- stable COPD, NAD.  10/01/11- 69 yoF smoker followed for COPD, tobacco dependence  and cough.  Increased SOB with walking or at rest-gotten worse since Christmas; prior to this was sick 08-14-2011-never regrouped from this. Wakes up at night gasping for air-has had to use rescue inhaler a few times when this happens. She finished prednisone and antibiotic that we had called in December 24 and has now been off of those for about 2 weeks. Stays short of breath as described. We discussed pending change of Combivent to the new delivery device. She is anxious about any change from familiar. Using Xanax about once daily. PFT- 07/07/11- severe obstructive airways disease with response to bronchodilator, hyperinflation, diffusion moderately reduced. FEV1 0.73/38%, FEV1/FVC 0.37, DLCO 45%. Loop configuration is emphysema.  10/21/11- 69 yoF smoker followed for COPD, tobacco dependence and cough. Post hospital-  Pt states increase SOB upon activity ,wheezing,denies any cough.     Daughter [name] is here Hospitalized in January 17 through 10/12/2011. She got progressively worse after last visit here and went to the emergency room. Treated for COPD exacerbation. Now she still is not quite back to her baseline but says she has not smoked since leaving the hospital. I picked on this and try to reinforce it with support. Especially short of breath with any exertion. She is wearing oxygen but once the office so she can continue to live alone and do her own yard work. October I could not, as we could get her back off. Lisinopril was changed to Norvasc and I discussed potential for lisinopril to be associated with dry cough and some people. She is tapering off of prednisone. Echocardiogram  reviewed and discussed-pulmonary artery systolic   12/03/11- 69 yoF smoker followed for COPD, tobacco dependence and cough. She  still needs oxygen sometimes during the day. We discussed oxygen delivery devices. Little cough or phlegm. Had Pneumovax.  01/25/12- 69 yoF smoker followed for COPD, tobacco dependence and  cough.  Daughter here Acute visit-SOB getting worse in the past week; Prednisone was called in last week(took last pill this am); had head congestion, ears popping last week-called in ABX/ augmentin-not working. Friday called and was told to go to ER-pt did not go. Coughing-productive-looks like slight yellow in color and thick-feels better once getting it up; having to wear O2 several times during the day.   04/12/12-  69 yoF smoker followed for COPD, tobacco dependence and cough.   States about the same. c/o difficulty breathing and using oxygen more often due to the hot weather. Also occassional cough and wheezing.  COPD assessment test (CAT) score 20/40 She has a jury summons which we discussed. She is oxygen dependent and cannot count on her portable oxygen to last the length of a jury obligation. She stays indoors to avoid the heat and humidity, getting others to mow her yard.   07/14/12- 69 yoF smoker followed for COPD, tobacco dependence and cough.   Good and bad days with breathing; no more than usual SOB. COPD assessment test (CAT) score 15/40 Has had flu shot. Weather changes are bothering her. Avoids using her oxygen in the house if she is comfortable but sleeps with it every night- 2L/[employer]. Using metered inhaler once daily. Joined "[employer]" exercise program. Mentions burning occipital pain and I suggested she look into evaluation of her cervical spine. Complains of easy bruising without blood. On baby aspirin.  09/23/12-69 yoF smoker followed for COPD, tobacco dependence and cough.  ACUTE VISIT: Increased SOB than normal; cough. (started feeling bad last week-was given Predn taper and Doxy RX) not feeling any better. Head gets "full" after coughing so much and able to small amount of mucus up. She has not felt well since December. In the last week we had called in prednisone and doxycycline which ended today without benefit. Short of breath, chest congestion, dry cough, full in  head, ears and nose. No fever now but feels weak. She only admits to smoking 2 or 3 cigarettes daily but that is still  CT chest 10/08/11 IMPRESSION:  No evidence of pulmonary embolism or other acute findings. Stable  emphysematous lung disease.  Original Report Authenticated By: Reola CalkinsGLENN T. YAMAGATA, M.D.   01/12/13- [age] yoF smoker followed for COPD, tobacco dependence and cough.  FOLLOWS FOR: feeling better since 09/2012 visit; Denies any wheezing or SOB. States allergies are flaring up-mowed the yard on Tuesday. Eyes burn and itch, blamed on pollen No real change in her chest or shortness of breath with exertion. Felt much better after Depo-Medrol last visit. Using oxygen only to sleep. Using Combivent inhaler one time daily.and Symbicort twice daily. Exercises at gym 4 days per week. Complains that calves tender and ache. [facility] did peripheral vascular evaluation, negative. Echocardiogram 10/06/2011-grade 2 diastolic dysfunction CXR 09/23/12 IMPRESSION:  COPD/chronic changes. No active cardiopulmonary disease.  Original Report Authenticated By: Charlett NoseKevin Dover, M.D.  03/20/13-  [age] yoF smoker followed for COPD, tobacco dependence and cough ACUTE VISIT: PT states she is unable to breath that she cant even get in the shower mowed yard-started having increased SOB since then-got worse over the weeked. Continues to use O2, nebulizer, and inhalers-slight increase in use.  Still smoking against repeated counseling Ok at baseline until riding mower dusty 6 days ago. Progressive SOB through week, clear mucus, scant. Needs to stay on O2 more of time. Using neb 1-3x/ day.  07/13/13- 67 yoF smoker followed for COPD, tobacco dependence and cough FOLLOWS FOR: having cough(started feeling bad on Sunday). SOB and wheezing. Increased shortness of breath with dyspnea on exertion especially in the last 4 days. Doesn't feel well. Cough interfering with sleep in recliner this week. Might have low-grade fever. Sore throat last  week. Unfortunately still smokes despite counseling. Has been going to [employer] for exercise and has usually felt comfortable off of oxygen during the daytime until this week.  01/16/14- 67 yoF smoker followed for COPD, tobacco dependence and cough FOLLOWS FOR:  Breathing doing well.  Some days better than others Complains of neck pain -Incidental.  Took left over prednisone in early March for her breathing, none since. Chronic cough and dyspnea without chest pain or discolored sputum.  03/12/14- 67 yoF smoker followed for COPD, tobacco dependence and cough ACUTE VISIT:  Increased sob, tightness in chest and some cough x1 week.  On antibiotics 6 days no relief 2 weks ago had sore throat then nose blowing, increased SOB. Took left-over prednisone. PCP gave levaquin 750 mg day 6/10, depomedrol. Has another prednisone taper to take if needed. L calf hurts with standing.  O2 2L CXR 01/16/14 IMPRESSION:  No evidence of acute cardiopulmonary disease.  Electronically Signed  By: Charline Bills M.D.  On: 01/16/2014 16:47  06/05/14- 33 yoF smoker followed for COPD, tobacco dependence and cough FOLLOWS FOR: Pt c/o increase in SOB, intermittent cough with some mucus production x 2days. Pt denies CP/tightness and f/c/s. Pt states she has been using her flutter valve to help with the chest congestion.  And admits to not feeling well for 2-3 weeks with increase chest tightness gradually worse. She took amoxicillin and prednisone we have given her to hold, but did not help. Breathing is not disturbing her sleep. Little sputum, nothing purulent and no fever or chest pain. Using nebulizer twice daily. Likes Symbicort. Albuterol substituted for Combivent because of insurance.  07/19/14- 68 yoF smoker followed for COPD, tobacco dependence and cough FOLLOW FOR: COPD  She still has cough and sob. She denies chest congestion. She has not been using the flutter valve. Concerned about relative cost of  albuterol inhaler and Combivent inhaler-prefers Combivent.  09/06/14- acute visit. Quick work in because she asked to come for Depo-Medrol injection. FOLLOWS FOR: Pt states she is having slight SOB with wheezing since last visit. Denies any cough. Had to take Augmentin and Prednisone Rx given to her at last visit around [facility] sick all through Christmas.   10/18/14- 68 yoF smoker followed for COPD, tobacco dependence and cough FOLLOWS FOR: Pt states she is having slight SOB with wheezing since last visit. Denies any cough. Had to take Augmentin and Prednisone Rx given to her at last visit around [facility] sick all through Christmas. She is now "hanging in there", not acutely ill. Using oxygen 2 L/ [employer] for sleep and as needed. Asks med refills to hold.  Review of Systems-see HPI Constitutional:   No-   weight loss, night sweats, fevers, chills, fatigue, lassitude. HEENT:   No-  headaches, difficulty swallowing, tooth/dental problems, sore throat,       No- sneezing, itching, no-ear ache, +nasal congestion, post nasal drip,  CV:  No- chest pain, orthopnea, PND, swelling in lower extremities, anasarca,  dizziness, palpitations.  Resp: Per HPI-     +shortness of breath with exertion or at rest.              +  productive cough,  +-productive cough,  No-  coughing up of blood.              No-  change in color of mucus.  + wheezing.   Skin: No-   rash or lesions. GI:  No-   heartburn, indigestion, abdominal pain, nausea, vomiting,  GU: MS:  + neck pain w/o radicular symptoms Neuro-  Psych:  No- change in mood or affect. depression or anxiety.  No memory loss.   Objective:   Physical Exam  General- Alert, Oriented, Affect-not anxious, Distress- none acute, conversational on O2,  Skin- + steroid ecchymoses on forearms Lymphadenopathy- none Head- atraumatic            Eyes- Gross vision intact, PERRLA, conjunctivae clear secretions            Ears- Hearing, canals-normal             Nose- + steady sniffing and turbinate edema, no-Septal dev,  polyps, erosion, perforation. .             Throat- Mallampati II , mucosa clear , drainage- none, tonsils- atrophic.   Neck-  trachea midline, no stridor , thyroid nl, carotid no bruit.+ cervical kyphosis Chest - symmetrical excursion , unlabored           Heart/CV- RRR , no murmur , no gallop  , no rub, nl s1 s2                           - JVD- none , edema-none, stasis changes- none, varices- none           Lung- distant , wheeze-none, cough-none , dullness-none, rub- none           Chest wall-  Abd- Br/ Gen/ Rectal- Not done, not indicated Extrem- no cyanosis edema or clubbing Neuro- grossly intact to observation And ainand in and

## 2014-10-18 NOTE — Patient Instructions (Signed)
Scripts sent to hold for augmentin and prednisone  Please call as needed

## 2014-10-20 ENCOUNTER — Encounter: Payer: Self-pay | Admitting: Internal Medicine

## 2014-10-20 NOTE — Assessment & Plan Note (Signed)
She is able to be off of oxygen more in the daytime currently.

## 2014-10-20 NOTE — Assessment & Plan Note (Signed)
Resolved most recent acute exacerbation. Plan-prescriptions for Augmentin and prednisone to hold as discussed

## 2014-10-20 NOTE — Assessment & Plan Note (Signed)
She may have slowed down a little but has not made a real personal commitment to smoking cessation effort despite all of our counseling.

## 2014-12-11 ENCOUNTER — Other Ambulatory Visit: Payer: Self-pay | Admitting: Internal Medicine

## 2014-12-12 ENCOUNTER — Ambulatory Visit (INDEPENDENT_AMBULATORY_CARE_PROVIDER_SITE_OTHER)
Admission: RE | Admit: 2014-12-12 | Discharge: 2014-12-12 | Disposition: A | Discharge status: Home / Self Care | Payer: Medicare HMO | Source: Ambulatory Visit | Attending: Internal Medicine | Admitting: Internal Medicine

## 2014-12-12 ENCOUNTER — Other Ambulatory Visit (INDEPENDENT_AMBULATORY_CARE_PROVIDER_SITE_OTHER): Payer: Medicare HMO

## 2014-12-12 ENCOUNTER — Ambulatory Visit (INDEPENDENT_AMBULATORY_CARE_PROVIDER_SITE_OTHER): Payer: Medicare HMO | Admitting: Internal Medicine

## 2014-12-12 VITALS — BP 128/70 | HR 93 | Ht 61.0 in | Wt 118.6 lb

## 2014-12-12 DIAGNOSIS — R0600 Dyspnea, unspecified: Secondary | ICD-10-CM | POA: Diagnosis not present

## 2014-12-12 DIAGNOSIS — J9611 Chronic respiratory failure with hypoxia: Secondary | ICD-10-CM | POA: Diagnosis not present

## 2014-12-12 DIAGNOSIS — J441 Chronic obstructive pulmonary disease with (acute) exacerbation: Secondary | ICD-10-CM

## 2014-12-12 LAB — BASIC METABOLIC PANEL
BUN: 11 mg/dL (ref 6–23)
CALCIUM: 9.9 mg/dL (ref 8.4–10.5)
CHLORIDE: 96 meq/L (ref 96–112)
CO2: 32 mEq/L (ref 19–32)
CREATININE: 0.65 mg/dL (ref 0.40–1.20)
GFR: 95.91 mL/min (ref >60.00)
Glucose, Bld: 99 mg/dL (ref 70–99)
Potassium: 3.9 mEq/L (ref 3.5–5.1)
Sodium: 135 mEq/L (ref 135–145)

## 2014-12-12 LAB — CBC WITH DIFFERENTIAL/PLATELET
BASOS PCT: 1.3 % (ref 0.0–3.0)
Basophils Absolute: 0.1 10*3/uL (ref 0.0–0.1)
EOS ABS: 0.2 10*3/uL (ref 0.0–0.7)
Eosinophils Relative: 2.3 % (ref 0.0–5.0)
HEMATOCRIT: 42.9 % (ref 36.0–46.0)
Hemoglobin: 14.4 g/dL (ref 12.0–15.0)
Lymphocytes Relative: 17.9 % (ref 12.0–46.0)
Lymphs Abs: 1.6 10*3/uL (ref 0.7–4.0)
MCHC: 33.7 g/dL (ref 30.0–36.0)
MCV: 89 fl (ref 78.0–100.0)
Monocytes Absolute: 0.9 10*3/uL (ref 0.1–1.0)
Monocytes Relative: 9.7 % (ref 3.0–12.0)
NEUTROS PCT: 68.8 % (ref 43.0–77.0)
Neutro Abs: 6.2 10*3/uL (ref 1.4–7.7)
PLATELETS: 313 10*3/uL (ref 150.0–400.0)
RBC: 4.82 Mil/uL (ref 3.87–5.11)
RDW: 12.8 % (ref 11.5–15.5)
WBC: 8.9 10*3/uL (ref 4.0–10.5)

## 2014-12-12 LAB — BRAIN NATRIURETIC PEPTIDE: Pro B Natriuretic peptide (BNP): 33 pg/mL (ref 0.0–100.0)

## 2014-12-12 MED ORDER — ALBUTEROL SULFATE (2.5 MG/3ML) 0.083% IN NEBU: 2.5000 mg | INHALATION_SOLUTION | Freq: Once | RESPIRATORY_TRACT | Status: DC

## 2014-12-12 MED ORDER — METHYLPREDNISOLONE ACETATE 80 MG/ML IJ SUSP
80.0000 mg | Freq: Once | INTRAMUSCULAR | Status: AC
  Administered 2014-12-12: 80 mg via INTRAMUSCULAR

## 2014-12-12 MED ORDER — LEVALBUTEROL HCL 0.63 MG/3ML IN NEBU
0.6300 mg | INHALATION_SOLUTION | Freq: Once | RESPIRATORY_TRACT | Status: AC
  Administered 2014-12-12: 0.63 mg via RESPIRATORY_TRACT

## 2014-12-12 NOTE — Patient Instructions (Signed)
Neb xop 0.63  Depo 80  Order- CXR  Dx COPD acute exacerbation              Lab- CBC w diff, BMET, D-dimer, BNP    Dx dyspnea

## 2014-12-12 NOTE — Progress Notes (Signed)
Patient ID: [name], female    DOB: 1945/10/16, 69 y.o.   MRN: [id number]  HPI [age] yoF smoker followed for COPD, tobacco dependence and cough. [employer] EMR reviewed for conversion to Epic.Has had extensive efforts at smoking counseling (husband died of tobacco induced lung cancer).  Comes today with increased cough since March 2, with concerns of relation to mold abatement being done in home. Refrigerator line leaked, later floor buckled and on removal, mold found beneath flooring. Thinks she may have coughed more since recent water leak onto kitchen floor. Denies fever, sore throat or obvious infection. Denies reflux. Has been more wheezey and short of breath Using her Advair and using Combivent 3 x/ day.   06/23/11- 69 yoF smoker followed for COPD, tobacco dependence and cough. She has had additional stress recently, son just had an MI and coronary stent. She has not tried to stop smoking despite counseling. She found Symbicort to be no better than Advair. Changing from a dry powder inhaler had no effect on her cough. Using rescue inhaler a little more. New complaint: Feels tight through the neck and unable to relax her neck and shoulders. A neck brace helps.  08/14/11 - 69 yoF smoker followed for COPD, tobacco dependence and cough. Acute visit- she is still smoking against advice. Has had flu vaccine. Now complains of acute illness for the past week. Chest tight, short of breath. Cough was productive but now is dry. Denies fever, chest pain blood or palpitation. 5 days ago and she accepted a prescription from us for Z-Pak, just finished today, but she says "it never helps". She is particularly concerned about recurrence of mucus plugging, since she understands that was the problem related to a sustained exacerbation with hospital stay in the past. She is using her rescue inhaler every 4 hours. CXR- 06/26/11- stable COPD, NAD.  10/01/11- 69 yoF smoker followed for COPD, tobacco dependence  and cough.  Increased SOB with walking or at rest-gotten worse since Christmas; prior to this was sick 08-14-2011-never regrouped from this. Wakes up at night gasping for air-has had to use rescue inhaler a few times when this happens. She finished prednisone and antibiotic that we had called in December 24 and has now been off of those for about 2 weeks. Stays short of breath as described. We discussed pending change of Combivent to the new delivery device. She is anxious about any change from familiar. Using Xanax about once daily. PFT- 07/07/11- severe obstructive airways disease with response to bronchodilator, hyperinflation, diffusion moderately reduced. FEV1 0.73/38%, FEV1/FVC 0.37, DLCO 45%. Loop configuration is emphysema.  10/21/11- 69 yoF smoker followed for COPD, tobacco dependence and cough. Post hospital-  Pt states increase SOB upon activity ,wheezing,denies any cough.     Daughter [name] is here Hospitalized in January 17 through 10/12/2011. She got progressively worse after last visit here and went to the emergency room. Treated for COPD exacerbation. Now she still is not quite back to her baseline but says she has not smoked since leaving the hospital. I picked on this and try to reinforce it with support. Especially short of breath with any exertion. She is wearing oxygen but once the office so she can continue to live alone and do her own yard work. October I could not, as we could get her back off. Lisinopril was changed to Norvasc and I discussed potential for lisinopril to be associated with dry cough and some people. She is tapering off of prednisone. Echocardiogram  reviewed and discussed-pulmonary artery systolic   12/03/11- 69 yoF smoker followed for COPD, tobacco dependence and cough. She  still needs oxygen sometimes during the day. We discussed oxygen delivery devices. Little cough or phlegm. Had Pneumovax.  01/25/12- 69 yoF smoker followed for COPD, tobacco dependence and  cough.  Daughter here Acute visit-SOB getting worse in the past week; Prednisone was called in last week(took last pill this am); had head congestion, ears popping last week-called in ABX/ augmentin-not working. Friday called and was told to go to ER-pt did not go. Coughing-productive-looks like slight yellow in color and thick-feels better once getting it up; having to wear O2 several times during the day.   04/12/12-  69 yoF smoker followed for COPD, tobacco dependence and cough.   States about the same. c/o difficulty breathing and using oxygen more often due to the hot weather. Also occassional cough and wheezing.  COPD assessment test (CAT) score 20/40 She has a jury summons which we discussed. She is oxygen dependent and cannot count on her portable oxygen to last the length of a jury obligation. She stays indoors to avoid the heat and humidity, getting others to mow her yard.   07/14/12- 69 yoF smoker followed for COPD, tobacco dependence and cough.   Good and bad days with breathing; no more than usual SOB. COPD assessment test (CAT) score 15/40 Has had flu shot. Weather changes are bothering her. Avoids using her oxygen in the house if she is comfortable but sleeps with it every night- 2L/[employer]. Using metered inhaler once daily. Joined "[employer]" exercise program. Mentions burning occipital pain and I suggested she look into evaluation of her cervical spine. Complains of easy bruising without blood. On baby aspirin.  09/23/12-69 yoF smoker followed for COPD, tobacco dependence and cough.  ACUTE VISIT: Increased SOB than normal; cough. (started feeling bad last week-was given Predn taper and Doxy RX) not feeling any better. Head gets "full" after coughing so much and able to small amount of mucus up. She has not felt well since December. In the last week we had called in prednisone and doxycycline which ended today without benefit. Short of breath, chest congestion, dry cough, full in  head, ears and nose. No fever now but feels weak. She only admits to smoking 2 or 3 cigarettes daily but that is still  CT chest 10/08/11 IMPRESSION:  No evidence of pulmonary embolism or other acute findings. Stable  emphysematous lung disease.  Original Report Authenticated By: Reola CalkinsGLENN T. YAMAGATA, M.D.   01/12/13- [age] yoF smoker followed for COPD, tobacco dependence and cough.  FOLLOWS FOR: feeling better since 09/2012 visit; Denies any wheezing or SOB. States allergies are flaring up-mowed the yard on Tuesday. Eyes burn and itch, blamed on pollen No real change in her chest or shortness of breath with exertion. Felt much better after Depo-Medrol last visit. Using oxygen only to sleep. Using Combivent inhaler one time daily.and Symbicort twice daily. Exercises at gym 4 days per week. Complains that calves tender and ache. [facility] did peripheral vascular evaluation, negative. Echocardiogram 10/06/2011-grade 2 diastolic dysfunction CXR 09/23/12 IMPRESSION:  COPD/chronic changes. No active cardiopulmonary disease.  Original Report Authenticated By: Charlett NoseKevin Dover, M.D.  03/20/13-  [age] yoF smoker followed for COPD, tobacco dependence and cough ACUTE VISIT: PT states she is unable to breath that she cant even get in the shower mowed yard-started having increased SOB since then-got worse over the weeked. Continues to use O2, nebulizer, and inhalers-slight increase in use.  Still smoking against repeated counseling Ok at baseline until riding mower dusty 6 days ago. Progressive SOB through week, clear mucus, scant. Needs to stay on O2 more of time. Using neb 1-3x/ day.  07/13/13- 67 yoF smoker followed for COPD, tobacco dependence and cough FOLLOWS FOR: having cough(started feeling bad on Sunday). SOB and wheezing. Increased shortness of breath with dyspnea on exertion especially in the last 4 days. Doesn't feel well. Cough interfering with sleep in recliner this week. Might have low-grade fever. Sore throat last  week. Unfortunately still smokes despite counseling. Has been going to [employer] for exercise and has usually felt comfortable off of oxygen during the daytime until this week.  01/16/14- 67 yoF smoker followed for COPD, tobacco dependence and cough FOLLOWS FOR:  Breathing doing well.  Some days better than others Complains of neck pain -Incidental.  Took left over prednisone in early March for her breathing, none since. Chronic cough and dyspnea without chest pain or discolored sputum.  03/12/14- 67 yoF smoker followed for COPD, tobacco dependence and cough ACUTE VISIT:  Increased sob, tightness in chest and some cough x1 week.  On antibiotics 6 days no relief 2 weks ago had sore throat then nose blowing, increased SOB. Took left-over prednisone. PCP gave levaquin 750 mg day 6/10, depomedrol. Has another prednisone taper to take if needed. L calf hurts with standing.  O2 2L CXR 01/16/14 IMPRESSION:  No evidence of acute cardiopulmonary disease.  Electronically Signed  By: Charline BillsSriyesh Krishnan M.D.  On: 01/16/2014 16:47  06/05/14- [age] yoF smoker followed for COPD, tobacco dependence and cough FOLLOWS FOR: Pt c/o increase in SOB, intermittent cough with some mucus production x 2days. Pt denies CP/tightness and f/c/s. Pt states she has been using her flutter valve to help with the chest congestion.  And admits to not feeling well for 2-3 weeks with increase chest tightness gradually worse. She took amoxicillin and prednisone we have given her to hold, but did not help. Breathing is not disturbing her sleep. Little sputum, nothing purulent and no fever or chest pain. Using nebulizer twice daily. Likes Symbicort. Albuterol substituted for Combivent because of insurance.  07/19/14- 68 yoF smoker followed for COPD, tobacco dependence and cough FOLLOW FOR: COPD  She still has cough and sob. She denies chest congestion. She has not been using the flutter valve. Concerned about relative cost of  albuterol inhaler and Combivent inhaler-prefers Combivent.  09/06/14- acute visit. Quick work in because she asked to come for Depo-Medrol injection. FOLLOWS FOR: Pt states she is having slight SOB with wheezing since last visit. Denies any cough. Had to take Augmentin and [facility] all through [facility].   10/18/14- 68 yoF smoker followed for COPD, tobacco dependence and cough FOLLOWS FOR: Pt states she is having slight SOB with wheezing since last visit. Denies any cough. Had to take Augmentin and Prednisone Rx [facility]stayed sick all through [place]. She is now "hanging in there", not acutely ill. Using oxygen 2 L/ [employer] for sleep and as needed. Asks med refills to hold.  12/12/14- 68 yoF smoker followed for COPD, tobacco dependence and cough Follow For: Increased sob over past few weeks - Denies cough , chest tightness or wheezing increased shortness of breath in the last 3 or 4 days particularly but without acute event. Using her nebulizer more. Denies fever, purulent sputum, sore throat, chest pain or palpitation. Legs swell  a little but not new. Using an incentive spirometer and Flutter. Has leftover prescriptions for amoxicillin and prednisone from January. Remains on oxygen at 2-3 L/ [employer].  Review of Systems-see HPI Constitutional:   No-   weight loss, night sweats, fevers, chills, fatigue, lassitude. HEENT:   No-  headaches, difficulty swallowing, tooth/dental problems, sore throat,       No- sneezing, itching, no-ear ache, nasal congestion, post nasal drip,  CV:  No- chest pain, orthopnea, PND, swelling in lower extremities, anasarca, dizziness, palpitations.  Resp: Per HPI-     +shortness of breath with exertion or at rest.                productive cough,  -productive cough,  No-  coughing up of blood.              No-  change in color of mucus.   wheezing.   Skin: No-   rash or  lesions. GI:  No-   heartburn, indigestion, abdominal pain, nausea, vomiting,  GU: MS:   neck pain w/o radicular symptoms Neuro-  Psych:  No- change in mood or affect. depression or anxiety.  No memory loss.   Objective:   Physical Exam  General- Alert, Oriented, Affect-not anxious, Distress- none acute, conversational on O2,  Skin- + steroid ecchymoses on forearms Lymphadenopathy- none Head- atraumatic            Eyes- Gross vision intact, PERRLA, conjunctivae clear secretions            Ears- Hearing, canals-normal            Nose- + steady sniffing and turbinate edema, no-Septal dev,  polyps, erosion, perforation. .             Throat- Mallampati II , mucosa clear , drainage- none, tonsils- atrophic.   Neck-  trachea midline, no stridor , thyroid nl, carotid no bruit.+ cervical kyphosis Chest - symmetrical excursion , unlabored           Heart/CV- RRR , no murmur , no gallop  , no rub, nl s1 s2                           - JVD- none , edema-none, stasis changes- none, varices- none           Lung- distant , wheeze-none, cough-none , dullness-none, rub- none           Chest wall-  Abd- Br/ Gen/ Rectal- Not done, not indicated Extrem- no cyanosis edema or clubbing Neuro- grossly intact to observation And ainand in and

## 2014-12-13 ENCOUNTER — Telehealth: Payer: Self-pay | Admitting: *Deleted

## 2014-12-13 NOTE — Telephone Encounter (Signed)
Pt is aware. Nothing further needed 

## 2014-12-13 NOTE — Telephone Encounter (Signed)
Last ov 12/12/14 patient forgot to ask if refill had been received. Xanax phoned in refill.

## 2014-12-13 NOTE — Telephone Encounter (Signed)
Yes she can start her prednisone taper

## 2014-12-13 NOTE — Telephone Encounter (Signed)
[name] was seen on  12/12/14. She was given a steroid injection. I called her to ask which pharmacy her Xanax rx needed to be submitted. She told me that she was still very sob today. She had a RX for Prednisone that you gave her back in January. She wanted to know if she should go ahead and start it?

## 2014-12-16 ENCOUNTER — Encounter: Payer: Self-pay | Admitting: Internal Medicine

## 2014-12-16 NOTE — Assessment & Plan Note (Signed)
Nonspecific complaint of increased dyspnea in recent days. Plan-CBC with differential, chemistries, d-dimer,BNP, CXR, depomedrol

## 2014-12-16 NOTE — Assessment & Plan Note (Signed)
Continues need for oxygen and I told her I don't anticipate this to change.

## 2015-02-14 ENCOUNTER — Telehealth: Payer: Self-pay | Admitting: Internal Medicine

## 2015-02-14 ENCOUNTER — Ambulatory Visit (INDEPENDENT_AMBULATORY_CARE_PROVIDER_SITE_OTHER): Payer: Medicare HMO | Admitting: Internal Medicine

## 2015-02-14 VITALS — BP 120/60 | HR 90 | Ht 61.0 in | Wt 116.0 lb

## 2015-02-14 DIAGNOSIS — F17200 Nicotine dependence, unspecified, uncomplicated: Secondary | ICD-10-CM

## 2015-02-14 DIAGNOSIS — Z720 Tobacco use: Secondary | ICD-10-CM

## 2015-02-14 DIAGNOSIS — J432 Centrilobular emphysema: Secondary | ICD-10-CM | POA: Diagnosis not present

## 2015-02-14 DIAGNOSIS — J441 Chronic obstructive pulmonary disease with (acute) exacerbation: Secondary | ICD-10-CM

## 2015-02-14 MED ORDER — FUROSEMIDE 20 MG PO TABS: ORAL_TABLET | ORAL | Status: DC

## 2015-02-14 MED ORDER — LEVALBUTEROL HCL 0.63 MG/3ML IN NEBU
0.6300 mg | INHALATION_SOLUTION | Freq: Once | RESPIRATORY_TRACT | Status: AC
  Administered 2015-02-14: 0.63 mg via RESPIRATORY_TRACT

## 2015-02-14 MED ORDER — METHYLPREDNISOLONE ACETATE 80 MG/ML IJ SUSP
80.0000 mg | Freq: Once | INTRAMUSCULAR | Status: AC
Start: 2015-02-14 — End: 2015-02-14
  Administered 2015-02-14: 80 mg via INTRAMUSCULAR

## 2015-02-14 MED ORDER — UMECLIDINIUM-VILANTEROL 62.5-25 MCG/INH IN AEPB: 1.0000 | INHALATION_SPRAY | Freq: Every day | RESPIRATORY_TRACT | Status: DC

## 2015-02-14 NOTE — Progress Notes (Signed)
Patient ID: [name], female    DOB: 1945/10/16, 69 y.o.   MRN: [id number]  HPI [age] yoF smoker followed for COPD, tobacco dependence and cough. [employer] EMR reviewed for conversion to Epic.Has had extensive efforts at smoking counseling (husband died of tobacco induced lung cancer).  Comes today with increased cough since March 2, with concerns of relation to mold abatement being done in home. Refrigerator line leaked, later floor buckled and on removal, mold found beneath flooring. Thinks she may have coughed more since recent water leak onto kitchen floor. Denies fever, sore throat or obvious infection. Denies reflux. Has been more wheezey and short of breath Using her Advair and using Combivent 3 x/ day.   06/23/11- 69 yoF smoker followed for COPD, tobacco dependence and cough. She has had additional stress recently, son just had an MI and coronary stent. She has not tried to stop smoking despite counseling. She found Symbicort to be no better than Advair. Changing from a dry powder inhaler had no effect on her cough. Using rescue inhaler a little more. New complaint: Feels tight through the neck and unable to relax her neck and shoulders. A neck brace helps.  08/14/11 - 69 yoF smoker followed for COPD, tobacco dependence and cough. Acute visit- she is still smoking against advice. Has had flu vaccine. Now complains of acute illness for the past week. Chest tight, short of breath. Cough was productive but now is dry. Denies fever, chest pain blood or palpitation. 5 days ago and she accepted a prescription from us for Z-Pak, just finished today, but she says "it never helps". She is particularly concerned about recurrence of mucus plugging, since she understands that was the problem related to a sustained exacerbation with hospital stay in the past. She is using her rescue inhaler every 4 hours. CXR- 06/26/11- stable COPD, NAD.  10/01/11- 64 yoF smoker followed for COPD, tobacco dependence  and cough.  Increased SOB with walking or at rest-gotten worse since Christmas; prior to this was sick 08-14-2011-never regrouped from this. Wakes up at night gasping for air-has had to use rescue inhaler a few times when this happens. She finished prednisone and antibiotic that we had called in December 24 and has now been off of those for about 2 weeks. Stays short of breath as described. We discussed pending change of Combivent to the new delivery device. She is anxious about any change from familiar. Using Xanax about once daily. PFT- 07/07/11- severe obstructive airways disease with response to bronchodilator, hyperinflation, diffusion moderately reduced. FEV1 0.73/38%, FEV1/FVC 0.37, DLCO 45%. Loop configuration is emphysema.  10/21/11- 64 yoF smoker followed for COPD, tobacco dependence and cough. Post hospital-  Pt states increase SOB upon activity ,wheezing,denies any cough.     [name] is here Hospitalized in January 17 through 10/12/2011. She got progressively worse after last visit here and went to the emergency room. Treated for COPD exacerbation. Now she still is not quite back to her baseline but says she has not smoked since leaving the hospital. I picked on this and try to reinforce it with support. Especially short of breath with any exertion. She is wearing oxygen but once the office so she can continue to live alone and do her own yard work. October I could not, as we could get her back off. Lisinopril was changed to Norvasc and I discussed potential for lisinopril to be associated with dry cough and some people. She is tapering off of prednisone. Echocardiogram  reviewed and discussed-pulmonary artery systolic   12/03/11- 64 yoF smoker followed for COPD, tobacco dependence and cough. She  still needs oxygen sometimes during the day. We discussed oxygen delivery devices. Little cough or phlegm. Had Pneumovax.  01/25/12- 64 yoF smoker followed for COPD, tobacco dependence and  cough.  Daughter here Acute visit-SOB getting worse in the past week; Prednisone was called in last week(took last pill this am); had head congestion, ears popping last week-called in ABX/ augmentin-not working. Friday called and was told to go to ER-pt did not go. Coughing-productive-looks like slight yellow in color and thick-feels better once getting it up; having to wear O2 several times during the day.   04/12/12-  69 yoF smoker followed for COPD, tobacco dependence and cough.   States about the same. c/o difficulty breathing and using oxygen more often due to the hot weather. Also occassional cough and wheezing.  COPD assessment test (CAT) score 20/40 She has a jury summons which we discussed. She is oxygen dependent and cannot count on her portable oxygen to last the length of a jury obligation. She stays indoors to avoid the heat and humidity, getting others to mow her yard.   07/14/12- 64 yoF smoker followed for COPD, tobacco dependence and cough.   Good and bad days with breathing; no more than usual SOB. COPD assessment test (CAT) score 15/40 Has had flu shot. Weather changes are bothering her. Avoids using her oxygen in the house if she is comfortable but sleeps with it every night- 2L/[employer]. Using metered inhaler once daily. Joined "[employer]" exercise program. Mentions burning occipital pain and I suggested she look into evaluation of her cervical spine. Complains of easy bruising without blood. On baby aspirin.  09/23/12-69 yoF smoker followed for COPD, tobacco dependence and cough.  ACUTE VISIT: Increased SOB than normal; cough. (started feeling bad last week-was given Predn taper and Doxy RX) not feeling any better. Head gets "full" after coughing so much and able to small amount of mucus up. She has not felt well since December. In the last week we had called in prednisone and doxycycline which ended today without benefit. Short of breath, chest congestion, dry cough, full in  head, ears and nose. No fever now but feels weak. She only admits to smoking 2 or 3 cigarettes daily but that is still  CT chest 10/08/11 IMPRESSION:  No evidence of pulmonary embolism or other acute findings. Stable  emphysematous lung disease.  Original Report Authenticated By: Reola CalkinsGLENN T. YAMAGATA, M.D.   01/12/13- [age] yoF smoker followed for COPD, tobacco dependence and cough.  FOLLOWS FOR: feeling better since 09/2012 visit; Denies any wheezing or SOB. States allergies are flaring up-mowed the yard on Tuesday. Eyes burn and itch, blamed on pollen No real change in her chest or shortness of breath with exertion. Felt much better after Depo-Medrol last visit. Using oxygen only to sleep. Using Combivent inhaler one time daily.and Symbicort twice daily. Exercises at gym 4 days per week. Complains that calves tender and ache. [facility] did peripheral vascular evaluation, negative. Echocardiogram 10/06/2011-grade 2 diastolic dysfunction CXR 09/23/12 IMPRESSION:  COPD/chronic changes. No active cardiopulmonary disease.  Original Report Authenticated By: Charlett NoseKevin Dover, M.D.  03/20/13-  [age] yoF smoker followed for COPD, tobacco dependence and cough ACUTE VISIT: PT states she is unable to breath that she cant even get in the shower mowed yard-started having increased SOB since then-got worse over the weeked. Continues to use O2, nebulizer, and inhalers-slight increase in use.  Still smoking against repeated counseling Ok at baseline until riding mower dusty 6 days ago. Progressive SOB through week, clear mucus, scant. Needs to stay on O2 more of time. Using neb 1-3x/ day.  07/13/13- 67 yoF smoker followed for COPD, tobacco dependence and cough FOLLOWS FOR: having cough(started feeling bad on Sunday). SOB and wheezing. Increased shortness of breath with dyspnea on exertion especially in the last 4 days. Doesn't feel well. Cough interfering with sleep in recliner this week. Might have low-grade fever. Sore throat last  week. Unfortunately still smokes despite counseling. Has been going to [employer] for exercise and has usually felt comfortable off of oxygen during the daytime until this week.  01/16/14- 67 yoF smoker followed for COPD, tobacco dependence and cough FOLLOWS FOR:  Breathing doing well.  Some days better than others Complains of neck pain -Incidental.  Took left over prednisone in early March for her breathing, none since. Chronic cough and dyspnea without chest pain or discolored sputum.  03/12/14- 67 yoF smoker followed for COPD, tobacco dependence and cough ACUTE VISIT:  Increased sob, tightness in chest and some cough x1 week.  On antibiotics 6 days no relief 2 weks ago had sore throat then nose blowing, increased SOB. Took left-over prednisone. PCP gave levaquin 750 mg day 6/10, depomedrol. Has another prednisone taper to take if needed. L calf hurts with standing.  O2 2L CXR 01/16/14 IMPRESSION:  No evidence of acute cardiopulmonary disease.  Electronically Signed  By: Charline BillsSriyesh Krishnan M.D.  On: 01/16/2014 16:47  06/05/14- [age] yoF smoker followed for COPD, tobacco dependence and cough FOLLOWS FOR: Pt c/o increase in SOB, intermittent cough with some mucus production x 2days. Pt denies CP/tightness and f/c/s. Pt states she has been using her flutter valve to help with the chest congestion.  And admits to not feeling well for 2-3 weeks with increase chest tightness gradually worse. She took amoxicillin and prednisone we have given her to hold, but did not help. Breathing is not disturbing her sleep. Little sputum, nothing purulent and no fever or chest pain. Using nebulizer twice daily. Likes Symbicort. Albuterol substituted for Combivent because of insurance.  07/19/14- 68 yoF smoker followed for COPD, tobacco dependence and cough FOLLOW FOR: COPD  She still has cough and sob. She denies chest congestion. She has not been using the flutter valve. Concerned about relative cost of  albuterol inhaler and Combivent inhaler-prefers Combivent.  09/06/14- acute visit. Quick work in because she asked to come for Depo-Medrol injection. FOLLOWS FOR: Pt states she is having slight SOB with wheezing since last visit. Denies any cough. Had to take Augmentin and Pred[facility]one [facility] all through [facility].   10/18/14- 68 yoF smoker followed for COPD, tobacco dependence and cough FOLLOWS FOR: Pt states she is having slight SOB with wheezing since last visit. Denies any cough. Had to take Augmentin and Prednisone Rx [facility]stayed sick all through [facility]. She is now "hanging in there", not acutely ill. Using oxygen 2 L/ [employer] for sleep and as needed. Asks med refills to hold.  12/12/14- 68 yoF smoker followed for COPD, tobacco dependence and cough Follow For: Increased sob over past few weeks - Denies cough , chest tightness or wheezing increased shortness of breath in the last 3 or 4 days particularly but without acute event. Using her nebulizer more. Denies fever, purulent sputum, sore throat, chest pain or palpitation. Legs swell  a little but not new. Using an incentive spirometer and Flutter. Has leftover prescriptions for amoxicillin and prednisone from January. Remains on oxygen at 2-3 L/ [employer].  02/14/15- 68 yoF smoker followed for COPD, tobacco dependence and cough Reports: x2wks has been having SOB, taking predinsone on 5.21 has one pill left.2L O2  [employer] Dyspnea is affecting her sleep. No fever or purulent sputum. Using Combivent rescue inhaler twice daily. CXR 12/12/14 IMPRESSION: Emphysema without superimposed pneumonia. Electronically Signed  By: Marnee Spring M.D.  On: 12/12/2014 10:08  Review of Systems-see HPI Constitutional:   No-   weight loss, night sweats, fevers, chills, fatigue, lassitude. HEENT:   No-  headaches, difficulty swallowing, tooth/dental problems, sore throat,        No- sneezing, itching, no-ear ache, nasal congestion, post nasal drip,  CV:  No- chest pain, orthopnea, PND, swelling in lower extremities, anasarca, dizziness, palpitations.  Resp: Per HPI-     +shortness of breath with exertion or at rest.                productive cough,  -productive cough,  No-  coughing up of blood.              No-  change in color of mucus.   wheezing.   Skin: No-   rash or lesions. GI:  No-   heartburn, indigestion, abdominal pain, nausea, vomiting,  GU: MS:   neck pain w/o radicular symptoms Neuro-  Psych:  No- change in mood or affect. depression or anxiety.  No memory loss.   Objective:   Physical Exam  General- Alert, Oriented, Affect+ anxious, Distress- none acute, conversational on O2,  Skin- + steroid ecchymoses on forearms Lymphadenopathy- none Head- atraumatic            Eyes- Gross vision intact, PERRLA, conjunctivae clear secretions            Ears- Hearing, canals-normal            Nose- + steady sniffing and turbinate edema, no-Septal dev,  polyps, erosion, perforation. .             Throat- Mallampati II , mucosa clear , drainage- none, tonsils- atrophic.   Neck-  trachea midline, no stridor , thyroid nl, carotid no bruit.+ cervical kyphosis Chest - symmetrical excursion , unlabored           Heart/CV- RRR , no murmur , no gallop  , no rub, nl s1 s2                           - JVD- none , edema-none, stasis changes- none, varices- none           Lung- distant , wheeze-none, cough+ bronchitic type , dullness-none, rub- none           Chest wall-  Abd- Br/ Gen/ Rectal- Not done, not indicated Extrem- no cyanosis edema or clubbing Neuro- grossly intact to observation And ainand in and

## 2015-02-14 NOTE — Telephone Encounter (Signed)
Spoke with pt, is wanting to be seen today if possible. Pt was given prednisone, began taking last Friday and not helping her symptoms: sob, "feels like no air is going in or out".  Has a minimally prod cough, no fever.   Saw pcp on Monday, did not do anything for acute symptoms as it was a wellness exam.   [facility] uses [facility] [address].    Last seen: 12/12/14 Next ov: 02/21/15  CY please advise.  Thanks!

## 2015-02-14 NOTE — Patient Instructions (Addendum)
Neb xop  Depo 80  Script sent for lasix/ furosemide diuretic- take in the morning as needed to help get rid of  extra fluid  Sample Anoro inhaler   1 puff, once daily  Try this instead of Symbicort. When it runs out, go back to Symbicort.

## 2015-02-14 NOTE — Telephone Encounter (Signed)
Per CY: see if pt can come see TP  Called spoke with patient at about 9:45am to offer appt with TP.  Pt declined, asking if there was anyway CY can see her today.  Advised CY did not have any openings but would check again.  CY with opening at 2:30pm - appt scheduled.  Pt aware to call back if symptoms worsen.  Pt voiced her understanding.  Nothing further needed; will sign off.

## 2015-02-16 ENCOUNTER — Encounter: Payer: Self-pay | Admitting: Internal Medicine

## 2015-02-16 NOTE — Assessment & Plan Note (Signed)
Acute exacerbation. I doubt bacterial infection. There may be some fluid retention. Exline plan-nebulizer treatments Xopenex, Lasix for 2 or 3 days.

## 2015-02-16 NOTE — Assessment & Plan Note (Addendum)
Nonspecific exacerbation. Plan- treatment with Xopenex, short trial of Lasix, smoking cessation, try sample of Anoro inhaler instead of Symbicort

## 2015-02-16 NOTE — Assessment & Plan Note (Signed)
Reinforced importance of smoking cessation today

## 2015-02-21 ENCOUNTER — Ambulatory Visit: Payer: Medicare HMO | Admitting: Internal Medicine

## 2015-03-13 ENCOUNTER — Emergency Department (HOSPITAL_COMMUNITY): Payer: Medicare HMO

## 2015-03-13 ENCOUNTER — Encounter (HOSPITAL_COMMUNITY): Payer: Self-pay | Admitting: Cardiology

## 2015-03-13 ENCOUNTER — Inpatient Hospital Stay (HOSPITAL_COMMUNITY)
Admission: EM | Admit: 2015-03-13 | Discharge: 2015-03-16 | DRG: 190 | Disposition: A | Discharge status: Home Health | Payer: Medicare HMO | Attending: Oncology | Admitting: Oncology

## 2015-03-13 DIAGNOSIS — J209 Acute bronchitis, unspecified: Secondary | ICD-10-CM | POA: Diagnosis present

## 2015-03-13 DIAGNOSIS — R0600 Dyspnea, unspecified: Secondary | ICD-10-CM

## 2015-03-13 DIAGNOSIS — F17201 Nicotine dependence, unspecified, in remission: Secondary | ICD-10-CM | POA: Diagnosis present

## 2015-03-13 DIAGNOSIS — Z7982 Long term (current) use of aspirin: Secondary | ICD-10-CM | POA: Diagnosis not present

## 2015-03-13 DIAGNOSIS — F17210 Nicotine dependence, cigarettes, uncomplicated: Secondary | ICD-10-CM | POA: Diagnosis present

## 2015-03-13 DIAGNOSIS — J9622 Acute and chronic respiratory failure with hypercapnia: Secondary | ICD-10-CM | POA: Diagnosis present

## 2015-03-13 DIAGNOSIS — Z9981 Dependence on supplemental oxygen: Secondary | ICD-10-CM | POA: Diagnosis not present

## 2015-03-13 DIAGNOSIS — I10 Essential (primary) hypertension: Secondary | ICD-10-CM | POA: Diagnosis present

## 2015-03-13 DIAGNOSIS — J441 Chronic obstructive pulmonary disease with (acute) exacerbation: Secondary | ICD-10-CM | POA: Insufficient documentation

## 2015-03-13 DIAGNOSIS — F419 Anxiety disorder, unspecified: Secondary | ICD-10-CM | POA: Diagnosis present

## 2015-03-13 DIAGNOSIS — E222 Syndrome of inappropriate secretion of antidiuretic hormone: Secondary | ICD-10-CM | POA: Diagnosis present

## 2015-03-13 DIAGNOSIS — R0602 Shortness of breath: Secondary | ICD-10-CM | POA: Diagnosis present

## 2015-03-13 DIAGNOSIS — J189 Pneumonia, unspecified organism: Secondary | ICD-10-CM | POA: Insufficient documentation

## 2015-03-13 DIAGNOSIS — E871 Hypo-osmolality and hyponatremia: Secondary | ICD-10-CM | POA: Insufficient documentation

## 2015-03-13 DIAGNOSIS — J440 Chronic obstructive pulmonary disease with acute lower respiratory infection: Secondary | ICD-10-CM | POA: Diagnosis present

## 2015-03-13 DIAGNOSIS — J309 Allergic rhinitis, unspecified: Secondary | ICD-10-CM

## 2015-03-13 DIAGNOSIS — J9611 Chronic respiratory failure with hypoxia: Secondary | ICD-10-CM | POA: Diagnosis present

## 2015-03-13 DIAGNOSIS — J449 Chronic obstructive pulmonary disease, unspecified: Secondary | ICD-10-CM | POA: Insufficient documentation

## 2015-03-13 DIAGNOSIS — F411 Generalized anxiety disorder: Secondary | ICD-10-CM | POA: Insufficient documentation

## 2015-03-13 DIAGNOSIS — Z7951 Long term (current) use of inhaled steroids: Secondary | ICD-10-CM | POA: Diagnosis not present

## 2015-03-13 DIAGNOSIS — R0603 Acute respiratory distress: Secondary | ICD-10-CM | POA: Insufficient documentation

## 2015-03-13 DIAGNOSIS — H1010 Acute atopic conjunctivitis, unspecified eye: Secondary | ICD-10-CM | POA: Diagnosis present

## 2015-03-13 LAB — POCT I-STAT 3, ART BLOOD GAS (G3+)
Acid-base deficit: 1 mmol/L (ref 0.0–2.0)
Bicarbonate: 25.9 mEq/L — ABNORMAL HIGH (ref 20.0–24.0)
O2 Saturation: 99 %
TCO2: 27 mmol/L (ref 0–100)
pCO2 arterial: 49.2 mmHg — ABNORMAL HIGH (ref 35.0–45.0)
pH, Arterial: 7.328 — ABNORMAL LOW (ref 7.350–7.450)
pO2, Arterial: 171 mmHg — ABNORMAL HIGH (ref 80.0–100.0)

## 2015-03-13 LAB — BRAIN NATRIURETIC PEPTIDE: B Natriuretic Peptide: 23 pg/mL (ref 0.0–100.0)

## 2015-03-13 LAB — CBC WITH DIFFERENTIAL/PLATELET
BASOS ABS: 0.1 10*3/uL (ref 0.0–0.1)
BASOS PCT: 1 % (ref 0–1)
Eosinophils Absolute: 1.3 10*3/uL — ABNORMAL HIGH (ref 0.0–0.7)
Eosinophils Relative: 8 % — ABNORMAL HIGH (ref 0–5)
HCT: 41.3 % (ref 36.0–46.0)
Hemoglobin: 14.1 g/dL (ref 12.0–15.0)
Lymphocytes Relative: 13 % (ref 12–46)
Lymphs Abs: 2.1 10*3/uL (ref 0.7–4.0)
MCH: 30.1 pg (ref 26.0–34.0)
MCHC: 34.1 g/dL (ref 30.0–36.0)
MCV: 88.1 fL (ref 78.0–100.0)
MONO ABS: 1.2 10*3/uL — AB (ref 0.1–1.0)
Monocytes Relative: 7 % (ref 3–12)
NEUTROS ABS: 11 10*3/uL — AB (ref 1.7–7.7)
Neutrophils Relative %: 71 % (ref 43–77)
PLATELETS: 364 10*3/uL (ref 150–400)
RBC: 4.69 MIL/uL (ref 3.87–5.11)
RDW: 12.4 % (ref 11.5–15.5)
WBC: 15.6 10*3/uL — AB (ref 4.0–10.5)

## 2015-03-13 LAB — BASIC METABOLIC PANEL
ANION GAP: 12 (ref 5–15)
BUN: <5 mg/dL — ABNORMAL LOW (ref 6–20)
CALCIUM: 9.5 mg/dL (ref 8.9–10.3)
CO2: 26 mmol/L (ref 22–32)
CREATININE: 0.62 mg/dL (ref 0.44–1.00)
Chloride: 93 mmol/L — ABNORMAL LOW (ref 101–111)
GFR calc non Af Amer: >60 mL/min (ref >60)
Glucose, Bld: 145 mg/dL — ABNORMAL HIGH (ref 65–99)
POTASSIUM: 4.5 mmol/L (ref 3.5–5.1)
Sodium: 131 mmol/L — ABNORMAL LOW (ref 135–145)

## 2015-03-13 LAB — TROPONIN I
Troponin I: <0.03 ng/mL (ref <0.031)
Troponin I: <0.03 ng/mL (ref <0.031)

## 2015-03-13 LAB — I-STAT CG4 LACTIC ACID, ED: LACTIC ACID, VENOUS: 2.38 mmol/L — AB (ref 0.5–2.0)

## 2015-03-13 LAB — PROCALCITONIN: Procalcitonin: <0.1 ng/mL

## 2015-03-13 LAB — MRSA PCR SCREENING: MRSA by PCR: NEGATIVE

## 2015-03-13 MED ORDER — FUROSEMIDE 10 MG/ML IJ SOLN
40.0000 mg | Freq: Once | INTRAMUSCULAR | Status: AC
  Administered 2015-03-13: 40 mg via INTRAVENOUS
  Filled 2015-03-13: qty 4

## 2015-03-13 MED ORDER — SODIUM CHLORIDE 0.9 % IV BOLUS (SEPSIS): 1000.0000 mL | Freq: Once | INTRAVENOUS | Status: DC

## 2015-03-13 MED ORDER — SODIUM CHLORIDE 0.9 % IV BOLUS (SEPSIS)
1000.0000 mL | Freq: Once | INTRAVENOUS | Status: AC
  Administered 2015-03-13: 1000 mL via INTRAVENOUS

## 2015-03-13 MED ORDER — LORAZEPAM 2 MG/ML IJ SOLN
0.5000 mg | Freq: Once | INTRAMUSCULAR | Status: AC
  Administered 2015-03-13: 0.5 mg via INTRAVENOUS
  Filled 2015-03-13: qty 1

## 2015-03-13 MED ORDER — ALPRAZOLAM 0.25 MG PO TABS: 0.2500 mg | ORAL_TABLET | Freq: Two times a day (BID) | ORAL | Status: DC | PRN

## 2015-03-13 MED ORDER — LEVOFLOXACIN IN D5W 500 MG/100ML IV SOLN
500.0000 mg | Freq: Once | INTRAVENOUS | Status: AC
  Administered 2015-03-13: 500 mg via INTRAVENOUS
  Filled 2015-03-13: qty 100

## 2015-03-13 MED ORDER — UMECLIDINIUM-VILANTEROL 62.5-25 MCG/INH IN AEPB: 1.0000 | INHALATION_SPRAY | Freq: Every day | RESPIRATORY_TRACT | Status: DC

## 2015-03-13 MED ORDER — LORAZEPAM 2 MG/ML IJ SOLN: 0.2500 mg | Freq: Three times a day (TID) | INTRAMUSCULAR | Status: DC | PRN

## 2015-03-13 MED ORDER — DEXTROSE-NACL 5-0.45 % IV SOLN
INTRAVENOUS | Status: DC
  Administered 2015-03-13: 13:00:00 via INTRAVENOUS

## 2015-03-13 MED ORDER — ASPIRIN EC 81 MG PO TBEC
81.0000 mg | DELAYED_RELEASE_TABLET | Freq: Every day | ORAL | Status: DC
  Administered 2015-03-13 – 2015-03-16 (×4): 81 mg via ORAL
  Filled 2015-03-13 (×4): qty 1

## 2015-03-13 MED ORDER — LORAZEPAM 2 MG/ML IJ SOLN
0.2500 mg | Freq: Three times a day (TID) | INTRAMUSCULAR | Status: DC | PRN
  Administered 2015-03-13 (×2): 0.5 mg via INTRAVENOUS
  Filled 2015-03-13 (×2): qty 1

## 2015-03-13 MED ORDER — ALBUTEROL SULFATE (2.5 MG/3ML) 0.083% IN NEBU: 2.5000 mg | INHALATION_SOLUTION | RESPIRATORY_TRACT | Status: DC | PRN

## 2015-03-13 MED ORDER — CETYLPYRIDINIUM CHLORIDE 0.05 % MT LIQD
7.0000 mL | Freq: Two times a day (BID) | OROMUCOSAL | Status: DC
  Administered 2015-03-13 (×2): 7 mL via OROMUCOSAL

## 2015-03-13 MED ORDER — METHYLPREDNISOLONE SODIUM SUCC 125 MG IJ SOLR
60.0000 mg | Freq: Four times a day (QID) | INTRAMUSCULAR | Status: AC
  Administered 2015-03-13 – 2015-03-14 (×3): 60 mg via INTRAVENOUS
  Filled 2015-03-13 (×3): qty 2

## 2015-03-13 MED ORDER — BUDESONIDE 0.25 MG/2ML IN SUSP
0.2500 mg | Freq: Two times a day (BID) | RESPIRATORY_TRACT | Status: DC
  Administered 2015-03-13 – 2015-03-16 (×5): 0.25 mg via RESPIRATORY_TRACT
  Filled 2015-03-13 (×8): qty 2

## 2015-03-13 MED ORDER — IPRATROPIUM-ALBUTEROL 0.5-2.5 (3) MG/3ML IN SOLN
3.0000 mL | RESPIRATORY_TRACT | Status: DC
  Administered 2015-03-13 – 2015-03-16 (×17): 3 mL via RESPIRATORY_TRACT
  Filled 2015-03-13 (×17): qty 3

## 2015-03-13 MED ORDER — METHYLPREDNISOLONE SODIUM SUCC 125 MG IJ SOLR: 60.0000 mg | Freq: Four times a day (QID) | INTRAMUSCULAR | Status: DC

## 2015-03-13 MED ORDER — ALPRAZOLAM 0.5 MG PO TABS
0.5000 mg | ORAL_TABLET | Freq: Every morning | ORAL | Status: DC
  Administered 2015-03-14: 0.5 mg via ORAL
  Filled 2015-03-13: qty 1

## 2015-03-13 MED ORDER — CHLORHEXIDINE GLUCONATE 0.12 % MT SOLN: 15.0000 mL | Freq: Two times a day (BID) | OROMUCOSAL | Status: DC

## 2015-03-13 MED ORDER — ENOXAPARIN SODIUM 40 MG/0.4ML ~~LOC~~ SOLN
40.0000 mg | SUBCUTANEOUS | Status: DC
  Administered 2015-03-13 – 2015-03-15 (×3): 40 mg via SUBCUTANEOUS
  Filled 2015-03-13 (×4): qty 0.4

## 2015-03-13 MED ORDER — ALBUTEROL (5 MG/ML) CONTINUOUS INHALATION SOLN
10.0000 mg/h | INHALATION_SOLUTION | Freq: Once | RESPIRATORY_TRACT | Status: AC
  Administered 2015-03-13: 10 mg/h via RESPIRATORY_TRACT
  Filled 2015-03-13: qty 20

## 2015-03-13 NOTE — ED Notes (Signed)
Family at the bedside.

## 2015-03-13 NOTE — Progress Notes (Signed)
Took patient off of BiPAP MD aware, placed on 2LNC, patient WOB improved.

## 2015-03-13 NOTE — ED Notes (Signed)
Pt reports she feels more SOB off the Bipap. RT and EDP notified. Pt placed back on the Bipap at this time.

## 2015-03-13 NOTE — ED Notes (Signed)
Pt to department via EMS from home- pt reports that she has been feeling bad for the past couple of weeks. Reports this morning the SOB was worse. Pt given 10 albuterol and 0.5 atrovent. Pt with expiratory wheezing noted. 125 solumedrol, hx of vent use. Uses home O2 at night. Bp- 150/80 Hr-106 RR-22 20g.

## 2015-03-13 NOTE — Care Management Note (Signed)
Case Management Note  Patient Details  Name: [name] MRN: [id number] Date of Birth: 1946/03/14  Subjective/Objective:     Adm w resp distress, uses home o2 per chart               Action/Plan: lives at home, pcp dr Casimiro Needle badger  Expected Discharge Date:                  Expected Discharge Plan:  Home w Home Health Services  In-House Referral:     Discharge planning Services     Post Acute Care Choice:    Choice offered to:     DME Arranged:    DME Agency:     HH Arranged:    HH Agency:     Status of Service:     Medicare Important Message Given:    Date Medicare IM Given:    Medicare IM give by:    Date Additional Medicare IM Given:    Additional Medicare Important Message give by:     If discussed at Long Length of Stay Meetings, dates discussed:    Additional Comments: ur review done  Hanley Hays, RN 03/13/2015, 2:24 PM

## 2015-03-13 NOTE — ED Provider Notes (Signed)
CSN: [id number]     Arrival date & time 03/13/15  0803 History   First MD Initiated Contact with Patient 03/13/15 [phone]     Chief Complaint  Patient presents with  . Shortness of Breath     (Consider location/radiation/quality/duration/timing/severity/associated sxs/prior Treatment) HPI Comments: 69 y/o F with hx of COPD on prn O2, HTN comes in with cc of DIB. Pt reports that she has been having some shortness of breath and wheezing for the last 2 weeks, gradually getting worse. Her DIB has worsened now to the point where she is having hard time catching her breath even at rest. She has no chest pain. + cough and wheezing, she is taking nebs and rescue inhalers at home with transient relief. There is no n/v/f/c. Pt has had intubations in the past. She is an active smoker.   ROS 10 Systems reviewed and are negative for acute change except as noted in the HPI.     Patient is a 69 y.o. female presenting with shortness of breath. The history is provided by the patient.  Shortness of Breath Associated symptoms: cough     Past Medical History  Diagnosis Date  . Chronic airway obstruction, not elsewhere classified   . Hypertension   . Ventilator dependent 2004; 2008    "for mucous plugs"  . Pneumonia   . H/O chronic bronchitis   . Shortness of breath 10/08/11    "all the time last 1 1/2 wk"   Past Surgical History  Procedure Laterality Date  . Oophorectomy  1986  . Vaginal hysterectomy  1978    partial  . Dilation and curettage of uterus  1970's   Family History  Problem Relation Age of Onset  . Liver cancer Mother   . Emphysema Father    History  Substance Use Topics  . Smoking status: Current Every Day Smoker -- 0.20 packs/day for 40 years    Types: Cigarettes  . Smokeless tobacco: Never Used     Comment: 4-5 ciggs a day  . Alcohol Use: 2.4 oz/week    4 Cans of beer per week   OB History    No data available     Review of Systems  Respiratory: Positive for cough  and shortness of breath.   All other systems reviewed and are negative.     Allergies  Review of patient's allergies indicates no known allergies.  Home Medications   Prior to Admission medications   Medication Sig Start Date End Date Taking? Authorizing Provider  ALPRAZolam Prudy Feeler) 0.5 MG tablet TAKE 1 TABLET THREE TIMES DAILY AS NEEDED FOR ANXIETY  OR  SLEEP 12/13/14   Waymon Budge, MD  aspirin 81 MG tablet 81 mg. Take one tablet every other day    Historical Provider, MD  budesonide-formoterol [facility]) 160-4.5 MCG/ACT inhaler 2 puffs then rinse mouth, twice daily 08/02/14   Waymon Budge, MD  furosemide (LASIX) 20 MG tablet 1 each morning as needed for fluid retention 02/14/15   Waymon Budge, MD  Ipratropium-Albuterol (COMBIVENT) 20-100 MCG/ACT AERS respimat Inhale 1 puff into the lungs every 6 (six) hours. 09/05/14   Waymon Budge, MD  ipratropium-albuterol (DUONEB) 0.5-2.5 (3) MG/3ML SOLN Take 3 mLs by nebulization 2 (two) times daily. DX 496 03/14/14 04/15/15  Waymon Budge, MD  lisinopril (PRINIVIL,ZESTRIL) 20 MG tablet Take 20 mg by mouth daily.    Historical Provider, MD  Umeclidinium-Vilanterol (ANORO ELLIPTA) 62.5-25 MCG/INH AEPB Inhale 1 puff into the lungs daily.  02/14/15   Waymon Budge, MD   BP 148/55 mmHg  Pulse 129  Temp(Src) 98.5 F (36.9 C) (Axillary)  Resp 26  Wt 116 lb (52.617 kg)  SpO2 93% Physical Exam  Constitutional: She is oriented to person, place, and time. She appears well-developed and well-nourished.  HENT:  Head: Normocephalic and atraumatic.  Eyes: EOM are normal. Pupils are equal, round, and reactive to light.  Neck: Neck supple.  Cardiovascular: Regular rhythm and normal heart sounds.   Pulmonary/Chest: She is in respiratory distress. She has wheezes.  RR - 30 Pt has pursed lip, tripoding, and unable to complete full sentences w/o having to breath. She has poor air movement with slight wheezing on expiration.  Abdominal: Soft. She  exhibits no distension. There is no tenderness. There is no rebound and no guarding.  Neurological: She is alert and oriented to person, place, and time.  Skin: Skin is warm and dry.  Nursing note and vitals reviewed.   ED Course  Procedures (including critical care time)  @ 11:00 - CAP tx started. Bipap was discontinued, but after 30 minutes, she started having increased work of breathing - and so bipap will be initiated again. She needs NIPPV for work of breathing and for maximal bronchial recruitment. Pt is still aox3, no hypoxia, and no indication for ABG. Will restart bipap. Spoke with Pulm per request of IM - based on the current situation, they are deferring management to admitting team and will be open for consult if patient deteriorates.   Labs Review Labs Reviewed  BASIC METABOLIC PANEL - Abnormal; Notable for the following:    Sodium 131 (*)    Chloride 93 (*)    Glucose, Bld 145 (*)    BUN <5 (*)    All other components within normal limits  CBC WITH DIFFERENTIAL/PLATELET - Abnormal; Notable for the following:    WBC 15.6 (*)    Neutro Abs 11.0 (*)    Monocytes Absolute 1.2 (*)    Eosinophils Relative 8 (*)    Eosinophils Absolute 1.3 (*)    All other components within normal limits  I-STAT CG4 LACTIC ACID, ED - Abnormal; Notable for the following:    Lactic Acid, Venous 2.38 (*)    All other components within normal limits  CULTURE, BLOOD (ROUTINE X 2)  CULTURE, BLOOD (ROUTINE X 2)  BRAIN NATRIURETIC PEPTIDE  TROPONIN I    Imaging Review Dg Chest Port 1 View  03/13/2015   CLINICAL DATA:  Shortness of breath.  EXAM: PORTABLE CHEST - 1 VIEW  COMPARISON:  12/12/2014  FINDINGS: There is hyperinflation of the lungs compatible with COPD. Heart is normal size. Patchy opacity in the right lung base. Cannot exclude pneumonia. Left lung is clear. No effusions or acute bony abnormality.  IMPRESSION: Emphysema/COPD.  Patchy right basilar airspace opacity medially concerning for  pneumonia.   Electronically Signed   By: Charlett Nose M.D.   On: 03/13/2015 08:49     EKG Interpretation   Date/Time:  Wednesday March 13 2015 08:11:17 EDT Ventricular Rate:  114 PR Interval:  146 QRS Duration: 84 QT Interval:  292 QTC Calculation: 402 R Axis:   71 Text Interpretation:  Sinus tachycardia Low voltage, precordial leads No  significant change since last tracing Confirmed by Rhunette Croft, MD, Janey Genta  [phone]) on 03/13/2015 8:34:33 AM      MDM   Final diagnoses:  Dyspnea  COPD with exacerbation  CAP (community acquired pneumonia)  Acute respiratory distress  CRITICAL CARE Performed by: Derwood Kaplan   Total critical care time: 40 minutes  Critical care time was exclusive of separately billable procedures and treating other patients.  Critical care was necessary to treat or prevent imminent or life-threatening deterioration.  Critical care was time spent personally by me on the following activities: development of treatment plan with patient and/or surrogate as well as nursing, discussions with consultants, evaluation of patient's response to treatment, examination of patient, obtaining history from patient or surrogate, ordering and performing treatments and interventions, ordering and review of laboratory studies, ordering and review of radiographic studies, pulse oximetry and re-evaluation of patient's condition.   Pt comes in with cc of respiratory distress. Hx of advanced COPD. Pt not hypoxic, but certainly in resp distress. Bipap initiated. CXR shows PNA concerns. Lactate is neg. Levquin started. Admit to step down.     Derwood Kaplan, MD 03/13/15 [phone]

## 2015-03-13 NOTE — ED Notes (Signed)
RT called place pt on Bipap

## 2015-03-13 NOTE — ED Notes (Signed)
Admitting Provider at the bedside.  

## 2015-03-13 NOTE — Progress Notes (Signed)
Patient is resting comfortably on 3L nasal cannula, and does not appear in any respiratory distress. BIPAP not placed on at this time.

## 2015-03-13 NOTE — H&P (Signed)
Date: 03/13/2015               Patient Name:  [name] MRN: [id number]  DOB: 1946/07/20 Age / Sex: 69 y.o., female   PCP: [name], MD         Medical Service: Internal Medicine Teaching Service         Attending Physician: Dr. Levert Feinstein, MD    First Contact: Dr. Allena Katz  Pager: [phone]   Second Contact: Dr. Delane Ginger  Pager: [phone]        After Hours (After 5p/  First Contact Pager: [phone]  weekends / holidays): Second Contact Pager: [phone]   Chief Complaint: Shortness of breath  History of Present Illness: [name] is a 69 year old female with COPD Gold stage III on 2 L of home oxygen, tobacco abuse who presents with one-month history dyspnea. Her daughter [name] was present at the time of interview and contributed to interview.    Over the last month, she reports worsening shortness of breath that is present all throughout the day without any inciting factor. At home, she reports taking albuterol nebulizer twice daily, Symbicort 2 puffs twice daily, and Combivent inhaler 1-2x/day without improvement in her symptoms. She reported going to her pulmonologist, Dr. Maple Hudson, and per chart review she was seen on 5/26 at which time she was assessed to have a COPD exacerbation and was treated with Xopenex nebulizer treatment, a sample of Anoro Ellipta [took one puff but stopped since it wasn't working], and given Lasix 20 mg 10 tablets to be used to reduce lower extremity edema which she felt had been helping. She reports that she is on 2 L of oxygen at home though was told by EMS today that she is on 1.5 L. She smokes less than half a pack per day and has been smoking for the last 40 years. She denies any productive cough, wheezing, sick contacts, fever, chills, nausea, vomiting, diarrhea, leg swelling though does feel short of breath at night and struggles with breathing when laying supine. Per her daughter, she has also had decreased food intake over the last 2 weeks. She  lives by herself and goes to the [facility] frequently to do resistance band exercises though was unable to participate this past Tuesday given her symptoms. In the past, her daughter reported she had been intubated twice for mucous plugging, and per chart review it was noted to be in 2004 and 2008.  In the emergency department, she was started on BiPAP and received 1 L normal saline bolus, Levaquin 500 mg IV, albuterol nebulizer treatment 1.    Meds: Current Facility-Administered Medications  Medication Dose Route Frequency Provider Last Rate Last Dose  . albuterol (PROVENTIL) (2.5 MG/3ML) 0.083% nebulizer solution 2.5 mg  2.5 mg Nebulization Q2H PRN Marrian Salvage, MD      . ALPRAZolam Prudy Feeler) tablet 0.25 mg  0.25 mg Oral BID PRN Marrian Salvage, MD      . aspirin EC tablet 81 mg  81 mg Oral Daily Marrian Salvage, MD      . budesonide (PULMICORT) nebulizer solution 0.25 mg  0.25 mg Nebulization BID Marrian Salvage, MD   Stopped at 03/13/15 1258  . enoxaparin (LOVENOX) injection 40 mg  40 mg Subcutaneous Q24H Marrian Salvage, MD      . furosemide (LASIX) injection 40 mg  40 mg Intravenous Once Marrian Salvage, MD      . ipratropium-albuterol (DUONEB) 0.5-2.5 (3) MG/3ML  nebulizer solution 3 mL  3 mL Nebulization Q4H Marrian Salvage, MD   3 mL at 03/13/15 1301  . methylPREDNISolone sodium succinate (SOLU-MEDROL) 125 mg/2 mL injection 60 mg  60 mg Intravenous Q6H Marrian Salvage, MD        Allergies: Allergies as of 03/13/2015  . (No Known Allergies)   Past Medical History  Diagnosis Date  . Chronic airway obstruction, not elsewhere classified   . Hypertension   . Ventilator dependent 2004; 2008    "for mucous plugs"  . Pneumonia   . H/O chronic bronchitis   . Shortness of breath 10/08/11    "all the time last 1 1/2 wk"   Past Surgical History  Procedure Laterality Date  . Oophorectomy  1986  . Vaginal hysterectomy  1978    partial  . Dilation and curettage of uterus  1970's     Family History  Problem Relation Age of Onset  . Liver cancer Mother   . Emphysema Father    History   Social History  . Marital Status: Married    Spouse Name: N/A  . Number of Children: N/A  . Years of Education: N/A   Occupational History  . Retirede    Social History Main Topics  . Smoking status: Current Every Day Smoker -- 0.20 packs/day for 40 years    Types: Cigarettes  . Smokeless tobacco: Never Used     Comment: 4-5 ciggs a day  . Alcohol Use: 2.4 oz/week    4 Cans of beer per week  . Drug Use: No  . Sexual Activity: No   Other Topics Concern  . Not on file   Social History Narrative    Review of Systems: As noted in the history of present illness  Physical Exam: Blood pressure 103/86, pulse 132, temperature 98.5 F (36.9 C), temperature source Axillary, resp. rate 29, weight 116 lb (52.617 kg), SpO2 100 %.  General: Elderly Caucasian female, anxious appearing resting in bed, increased work of breathing HEENT: PERRL, EOMI, no scleral icterus, wearing BiPAP mask Cardiac: tachycardic, no rubs, murmurs or gallops Pulm: Coarse breath sounds noted on the posterior lung fields Abd: soft, nontender, nondistended, BS present Ext: warm and well perfused, 2+ pedal pulses, no pedal edema,  Neuro: responds to questions appropriately albeit with difficulty with BiPAP mask  Lab results: Basic Metabolic Panel:  Recent Labs  40/98/11 0846  NA 131*  K 4.5  CL 93*  CO2 26  GLUCOSE 145*  BUN <5*  CREATININE 0.62  CALCIUM 9.5   CBC:  Recent Labs  03/13/15 0846  WBC 15.6*  NEUTROABS 11.0*  HGB 14.1  HCT 41.3  MCV 88.1  PLT 364   Cardiac Enzymes:  Recent Labs  03/13/15 0846  TROPONINI <0.03    Imaging results:  Dg Chest Port 1 View  03/13/2015   CLINICAL DATA:  Shortness of breath.  EXAM: PORTABLE CHEST - 1 VIEW  COMPARISON:  12/12/2014  FINDINGS: There is hyperinflation of the lungs compatible with COPD. Heart is normal size. Patchy opacity  in the right lung base. Cannot exclude pneumonia. Left lung is clear. No effusions or acute bony abnormality.  IMPRESSION: Emphysema/COPD.  Patchy right basilar airspace opacity medially concerning for pneumonia.   Electronically Signed   By: Charlett Nose M.D.   On: 03/13/2015 08:49    Other results: EKG: Reviewed and compared with 10/09/11. Sinus tachycardia Normal axis    Assessment & Plan by Problem:  Ms. Kissick  is a 69 year old female with COPD Gold stage III on 2 L of home oxygen, tobacco abuse who presents with one-month history dyspnea.  Dyspnea: Acute on chronic diastolic heart failure is possible given that echo 10/06/11 was notable for EF 55-65%, grade 2 diastolic dysfunction, elevated pulmonary artery pressure 43 mmHg though she was without lower extremity edema, crackles on exam, notable pulmonary edema on chest x-ray. BNP on admission was also within normal limits. Acute COPD exacerbation is possible given the severity of her disease with most recent PFTs in 2012 [FEV1 33%, FEV1/FVC 37%] consistent with GOLD Stage III though she was without productive cough, wheezing, inciting factor but does continue to smoke cigarettes. Pneumonia possible given questionable right basilar opacity on admission chest x-ray though time course does not fit her symptoms. Given the progressive nature of her symptoms, interstitial lung disease also possibility though less likely given that her lung volumes are not consistent with a restrictive pattern as noted in 2012 PFTs and no history of occupational exposures. Leukocytosis 12.6 suggestive of infection though can be non-specific in stress reaction; she did not receive steroids in the ED. Initial troponin negative.  -Continue BiPAP and wean down to her home oxygen as tolerated -Follow-up blood cultures 2 -Follow-up pro-calcitonin -Give Lasix 40 mg IV 1 -Give Solu-Medrol 125 mg IV 1 -Continue DuoNeb every 4 hours -Repeat echo -Check PA and lateral  chest x-ray -Follow-up ABG  COPD GOLD Stage 3: Home medications include Symbicort 2 puffs twice daily, DuoNeb's 2 times daily, Combivent 1 puff every 6 hours as needed, Xanax 0.5 mg. -Continue Pulmicort 0.25 -Continue albuterol nebulizer every 2 hours as needed for shortness of breath, wheezing -Continue Ativan 0.25-0.5 mg IV every 8 hours as needed for anxiety -DuoNeb's as noted above  #FEN:  -Diet: Nothing by mouth for now  #DVT prophylaxis: Lovenox  #CODE STATUS: FULL CODE -Defer to daughter [name] [phone]] if patients lacks decision-making capacity -Confirmed with patient on admission  Dispo: Disposition is deferred at this time, awaiting improvement of current medical problems.   The patient does have a current PCP [name], MD) and does not need an [facility] hospital follow-up appointment after discharge.  The patient does have transportation limitations that hinder transportation to clinic appointments.  Signed: Beather Arbour, MD 03/13/2015, 12:17 PM

## 2015-03-14 ENCOUNTER — Inpatient Hospital Stay (HOSPITAL_COMMUNITY): Payer: Medicare HMO

## 2015-03-14 DIAGNOSIS — Z7951 Long term (current) use of inhaled steroids: Secondary | ICD-10-CM

## 2015-03-14 DIAGNOSIS — Z9981 Dependence on supplemental oxygen: Secondary | ICD-10-CM

## 2015-03-14 DIAGNOSIS — J449 Chronic obstructive pulmonary disease, unspecified: Secondary | ICD-10-CM | POA: Insufficient documentation

## 2015-03-14 DIAGNOSIS — F419 Anxiety disorder, unspecified: Secondary | ICD-10-CM

## 2015-03-14 DIAGNOSIS — F17210 Nicotine dependence, cigarettes, uncomplicated: Secondary | ICD-10-CM

## 2015-03-14 DIAGNOSIS — R0603 Acute respiratory distress: Secondary | ICD-10-CM

## 2015-03-14 DIAGNOSIS — J441 Chronic obstructive pulmonary disease with (acute) exacerbation: Secondary | ICD-10-CM | POA: Insufficient documentation

## 2015-03-14 DIAGNOSIS — J9622 Acute and chronic respiratory failure with hypercapnia: Secondary | ICD-10-CM

## 2015-03-14 LAB — BASIC METABOLIC PANEL
Anion gap: 10 (ref 5–15)
BUN: 5 mg/dL — AB (ref 6–20)
CALCIUM: 9 mg/dL (ref 8.9–10.3)
CO2: 28 mmol/L (ref 22–32)
CREATININE: 0.61 mg/dL (ref 0.44–1.00)
Chloride: 91 mmol/L — ABNORMAL LOW (ref 101–111)
GLUCOSE: 151 mg/dL — AB (ref 65–99)
Potassium: 3.9 mmol/L (ref 3.5–5.1)
Sodium: 129 mmol/L — ABNORMAL LOW (ref 135–145)

## 2015-03-14 LAB — CBC WITH DIFFERENTIAL/PLATELET
Basophils Absolute: 0 10*3/uL (ref 0.0–0.1)
Basophils Relative: 0 % (ref 0–1)
EOS ABS: 0 10*3/uL (ref 0.0–0.7)
Eosinophils Relative: 0 % (ref 0–5)
HCT: 37.8 % (ref 36.0–46.0)
HEMOGLOBIN: 12.9 g/dL (ref 12.0–15.0)
Lymphocytes Relative: 5 % — ABNORMAL LOW (ref 12–46)
Lymphs Abs: 0.8 10*3/uL (ref 0.7–4.0)
MCH: 29.7 pg (ref 26.0–34.0)
MCHC: 34.1 g/dL (ref 30.0–36.0)
MCV: 87.1 fL (ref 78.0–100.0)
Monocytes Absolute: 0.5 10*3/uL (ref 0.1–1.0)
Monocytes Relative: 3 % (ref 3–12)
NEUTROS PCT: 92 % — AB (ref 43–77)
Neutro Abs: 15.7 10*3/uL — ABNORMAL HIGH (ref 1.7–7.7)
PLATELETS: 316 10*3/uL (ref 150–400)
RBC: 4.34 MIL/uL (ref 3.87–5.11)
RDW: 12.2 % (ref 11.5–15.5)
WBC: 17 10*3/uL — AB (ref 4.0–10.5)

## 2015-03-14 LAB — LACTIC ACID, PLASMA: Lactic Acid, Venous: 1.6 mmol/L (ref 0.5–2.0)

## 2015-03-14 MED ORDER — LISINOPRIL 20 MG PO TABS
20.0000 mg | ORAL_TABLET | Freq: Every day | ORAL | Status: DC
  Administered 2015-03-14 – 2015-03-16 (×3): 20 mg via ORAL
  Filled 2015-03-14 (×3): qty 1

## 2015-03-14 MED ORDER — LORAZEPAM 2 MG/ML IJ SOLN
0.5000 mg | Freq: Three times a day (TID) | INTRAMUSCULAR | Status: DC | PRN
  Administered 2015-03-15: 0.5 mg via INTRAVENOUS
  Filled 2015-03-14: qty 1

## 2015-03-14 MED ORDER — ACETYLCYSTEINE 20 % IN SOLN
2.0000 mL | RESPIRATORY_TRACT | Status: DC
  Administered 2015-03-14 (×2): 2 mL via RESPIRATORY_TRACT
  Filled 2015-03-14 (×12): qty 4

## 2015-03-14 MED ORDER — ACETYLCYSTEINE 10 % IN SOLN
2.0000 mL | RESPIRATORY_TRACT | Status: DC
  Filled 2015-03-14 (×7): qty 4

## 2015-03-14 MED ORDER — METHYLPREDNISOLONE SODIUM SUCC 125 MG IJ SOLR
60.0000 mg | Freq: Four times a day (QID) | INTRAMUSCULAR | Status: AC
  Administered 2015-03-14 – 2015-03-15 (×4): 60 mg via INTRAVENOUS
  Filled 2015-03-14 (×4): qty 2

## 2015-03-14 MED ORDER — BENZONATATE 100 MG PO CAPS
100.0000 mg | ORAL_CAPSULE | Freq: Once | ORAL | Status: AC
Start: 2015-03-14 — End: 2015-03-14
  Administered 2015-03-14: 100 mg via ORAL
  Filled 2015-03-14: qty 1

## 2015-03-14 MED ORDER — ACETYLCYSTEINE 10 % IN SOLN
4.0000 mL | RESPIRATORY_TRACT | Status: DC
  Filled 2015-03-14 (×6): qty 4

## 2015-03-14 MED ORDER — LEVOFLOXACIN IN D5W 500 MG/100ML IV SOLN
500.0000 mg | INTRAVENOUS | Status: DC
  Administered 2015-03-14: 500 mg via INTRAVENOUS
  Filled 2015-03-14 (×2): qty 100

## 2015-03-14 MED ORDER — LORAZEPAM 2 MG/ML IJ SOLN
0.2500 mg | Freq: Three times a day (TID) | INTRAMUSCULAR | Status: DC | PRN
  Administered 2015-03-14 (×2): 0.25 mg via INTRAVENOUS
  Filled 2015-03-14 (×2): qty 1

## 2015-03-14 MED ORDER — PREDNISONE 20 MG PO TABS
40.0000 mg | ORAL_TABLET | Freq: Every day | ORAL | Status: DC
  Administered 2015-03-15 – 2015-03-16 (×2): 40 mg via ORAL
  Filled 2015-03-14 (×4): qty 2

## 2015-03-14 MED ORDER — GUAIFENESIN ER 600 MG PO TB12
600.0000 mg | ORAL_TABLET | Freq: Two times a day (BID) | ORAL | Status: DC
  Administered 2015-03-14 – 2015-03-16 (×5): 600 mg via ORAL
  Filled 2015-03-14 (×6): qty 1

## 2015-03-14 MED ORDER — NICOTINE 14 MG/24HR TD PT24
14.0000 mg | MEDICATED_PATCH | Freq: Every day | TRANSDERMAL | Status: DC
  Administered 2015-03-14 – 2015-03-16 (×3): 14 mg via TRANSDERMAL
  Filled 2015-03-14 (×3): qty 1

## 2015-03-14 MED ORDER — CETYLPYRIDINIUM CHLORIDE 0.05 % MT LIQD
7.0000 mL | Freq: Two times a day (BID) | OROMUCOSAL | Status: DC
  Administered 2015-03-14 – 2015-03-15 (×5): 7 mL via OROMUCOSAL

## 2015-03-14 NOTE — Discharge Summary (Signed)
Name: [name] MRN: [id number] DOB: [birth year] 69 y.o. PCP: [name], MD  Date of Admission: 03/13/2015  8:03 AM Date of Discharge: 03/16/2015 Attending Physician: Levert Feinstein, MD  Discharge Diagnosis:  Acute on chronic hypercapnic respiratory failure COPD Gold stage III Hyponatremia Tobacco abuse   Discharge Medications:   Medication List    STOP taking these medications        ALPRAZolam 0.5 MG tablet  Commonly known as:  XANAX      TAKE these medications        aspirin 81 MG tablet  Take 81 mg by mouth every other day. Take one tablet every other day     budesonide-formoterol 160-4.5 MCG/ACT inhaler  Commonly known as:  SYMBICORT  2 puffs then rinse mouth, twice daily     guaiFENesin 600 MG 12 hr tablet  Commonly known as:  MUCINEX  Take 1 tablet (600 mg total) by mouth 2 (two) times daily.     ipratropium-albuterol 0.5-2.5 (3) MG/3ML Soln  Commonly known as:  DUONEB  Take 3 mLs by nebulization 2 (two) times daily. DX 496     Ipratropium-Albuterol 20-100 MCG/ACT Aers respimat  Commonly known as:  COMBIVENT  Inhale 1 puff into the lungs every 6 (six) hours.     levofloxacin 500 MG tablet  Commonly known as:  LEVAQUIN  Take 1 tablet (500 mg total) by mouth daily.     lisinopril 20 MG tablet  Commonly known as:  PRINIVIL,ZESTRIL  Take 20 mg by mouth daily.     LORazepam 0.5 MG tablet  Commonly known as:  ATIVAN  Take 1 tablet (0.5 mg total) by mouth every 6 (six) hours as needed for anxiety.     nicotine 14 mg/24hr patch  Commonly known as:  NICODERM CQ - dosed in mg/24 hours  Place 1 patch (14 mg total) onto the skin daily.     predniSONE 10 MG tablet  Commonly known as:  DELTASONE  Take 4 tablets x 2 days. Take 2 tablets x 1 day. Take 1 tablet x 1 day.     PRESERVISION AREDS 2 PO  Take 2 capsules by mouth daily.        Disposition and follow-up:   [name] was discharged from [facility]  in Stable condition.  At the hospital follow up visit please address:  -Resolution of dyspnea -Tolerance of Ativan for anxiety control -Repeat BMET to assess hyponatremia  Follow-up Appointments: Follow-up Information    Follow up with [name], MD. Go on 03/20/2015.   Specialty:  Family Medicine   Why:  345PM    Contact information:   [address] [place] [address] [phone]       Follow up with Waymon Budge, MD. Go on 03/21/2015.   Specialty:  Pulmonary Disease   Why:  1115AM   Contact information:   [address] [place] [address] [phone]       Discharge Instructions: Discharge Instructions    Call MD for:  difficulty breathing, headache or visual disturbances    Complete by:  As directed      Call MD for:  difficulty breathing, headache or visual disturbances    Complete by:  As directed      Call MD for:  persistant dizziness or light-headedness    Complete by:  As directed      Call MD for:  redness, tenderness, or signs of infection (pain,  swelling, redness, odor or green/yellow discharge around incision site)    Complete by:  As directed      Diet - low sodium heart healthy    Complete by:  As directed      Discharge instructions    Complete by:  As directed   Please follow up with Dr. Maple Hudson.     Increase activity slowly    Complete by:  As directed      Increase activity slowly    Complete by:  As directed            Consultations:    Procedures Performed:  X-ray Chest Pa And Lateral  03/14/2015   CLINICAL DATA:  Difficulty breathing  EXAM: CHEST - 2 VIEW  COMPARISON:  03/13/2015  FINDINGS: Cardiac shadow is stable. The lungs are hyperinflated but stable. There is been interval clearing in the right lung base when compared with the prior exam. No effusion or pneumothorax is seen. Old rib fractures on the left are noted. Aortic calcifications are seen.  IMPRESSION: Stable emphysematous changes without acute abnormality.   Interval clearing in the right lung base.  Imaging findings of potential clinical significance:  Aortic Atherosclerosis   Electronically Signed   By: Alcide Clever M.D.   On: 03/14/2015 08:22   Dg Chest Port 1 View  03/13/2015   CLINICAL DATA:  Shortness of breath.  EXAM: PORTABLE CHEST - 1 VIEW  COMPARISON:  12/12/2014  FINDINGS: There is hyperinflation of the lungs compatible with COPD. Heart is normal size. Patchy opacity in the right lung base. Cannot exclude pneumonia. Left lung is clear. No effusions or acute bony abnormality.  IMPRESSION: Emphysema/COPD.  Patchy right basilar airspace opacity medially concerning for pneumonia.   Electronically Signed   By: Charlett Nose M.D.   On: 03/13/2015 08:49    2D Echo: Study Conclusions  - Left ventricle: The cavity size was normal. Systolic function was normal. The estimated ejection fraction was in the range of 60% to 65%. Wall motion was normal; there were no regional wall motion abnormalities. Doppler parameters are consistent with abnormal left ventricular relaxation (grade 1 diastolic dysfunction).  Transthoracic echocardiography. M-mode, complete 2D, spectral Doppler, and color Doppler. Birthdate: Patient birthdate: 1946-03-06. Age: Patient is 69 yr old. Sex: Gender: female. BMI: 21.2 kg/m^2. Blood pressure:   124/45 Patient status: Inpatient. Study date: Study date: 03/14/2015. Study time: 02:16 PM. Location: ICU/CCU  -------------------------------------------------------------------  ------------------------------------------------------------------- Left ventricle: The cavity size was normal. Systolic function was normal. The estimated ejection fraction was in the range of 60% to 65%. Wall motion was normal; there were no regional wall motion abnormalities. Doppler parameters are consistent with abnormal left ventricular relaxation (grade 1 diastolic  dysfunction).  ------------------------------------------------------------------- Aortic valve: Not well visualized. Mildly calcified leaflets. Mobility was not restricted. Doppler: Transvalvular velocity was within the normal range. There was no stenosis. There was no regurgitation.  ------------------------------------------------------------------- Aorta: Aortic root: The aortic root was normal in size.  ------------------------------------------------------------------- Mitral valve:  Structurally normal valve.  Mobility was not restricted. Doppler: Transvalvular velocity was within the normal range. There was no evidence for stenosis. There was no regurgitation.  Peak gradient (D): 2 mm Hg.  ------------------------------------------------------------------- Left atrium: The atrium was normal in size.  ------------------------------------------------------------------- Right ventricle: The cavity size was normal. Wall thickness was normal. Systolic function was normal.  ------------------------------------------------------------------- Pulmonic valve:  Not well visualized. Doppler: Transvalvular velocity was within the normal range. There was no evidence for stenosis.  ------------------------------------------------------------------- Tricuspid valve:  Not well visualized. The valve appears to be grossly normal.  Doppler: Transvalvular velocity was within the normal range. There was no regurgitation.  ------------------------------------------------------------------- Right atrium: The atrium was normal in size.  ------------------------------------------------------------------- Pericardium: There was no pericardial effusion.  ------------------------------------------------------------------- Systemic veins: Inferior vena cava: The vessel was normal in size.  Admission HPI: [name] is a 69 year old female with COPD Gold stage III on 2 L  of home oxygen, tobacco abuse who presents with one-month history dyspnea. Her daughter Ashby Dawes was present at the time of interview and contributed to interview.  Over the last month, she reports worsening shortness of breath that is present all throughout the day without any inciting factor. At home, she reports taking albuterol nebulizer twice daily, Symbicort 2 puffs twice daily, and Combivent inhaler 1-2x/day without improvement in her symptoms. She reported going to her pulmonologist, Dr. Maple Hudson, and per chart review she was seen on 5/26 at which time she was assessed to have a COPD exacerbation and was treated with Xopenex nebulizer treatment, a sample of Anoro Ellipta [took one puff but stopped since it wasn't working], and given Lasix 20 mg 10 tablets to be used to reduce lower extremity edema which she felt had been helping. She reports that she is on 2 L of oxygen at home though was told by EMS today that she is on 1.5 L. She smokes less than half a pack per day and has been smoking for the last 40 years. She denies any productive cough, wheezing, sick contacts, fever, chills, nausea, vomiting, diarrhea, leg swelling though does feel short of breath at night and struggles with breathing when laying supine. Per her daughter, she has also had decreased food intake over the last 2 weeks. She lives by herself and goes to the [facility] frequently to do resistance band exercises though was unable to participate this past Tuesday given her symptoms. In the past, her daughter reported she had been intubated twice for mucous plugging, and per chart review it was noted to be in 2004 and 2008.  In the emergency department, she was started on BiPAP and received 1 L normal saline bolus, Levaquin 500 mg IV, albuterol nebulizer treatment 1.  Hospital Course by problem list:   Acute-on-chronic hypercapnic respiratory failure: Possibly secondary to mucus plugging complicated by anxiety in the setting of COPD GOLD Stage  III. Upon arrival from the emergency department to the stepdown unit, she no longer required BiPAP. ABG on admission with pH 7.328, CO2 49.2, O2 171.0, bicarbonate 25.9 consistent with acute hypercapnic respiratory failure though the report of her symptoms appeared consistent with a progressive process. She remained without symptoms of wheezing or productive cough throughout her hospital stay. D-dimer was checked to rule out chronic PE, and it was 0.41 which was within normal limits. Cardiac echo with findings noted above that did not represent worsening of her underlying disease. She received Solu-Medrol 125 mg IV and was transitioned to prednisone 40 mg for total 5 day course [to be tapered down to 20 mg 1 day, 10 mg 1 day] . She was also treated with with Levaquin 500 mg IV then transition to oral tablet for a total 5 day treatment. As anxiety was felt to be contributory, she was transitioned off of her home Xanax to Ativan 0.5 mg every 8 hours as needed for anxiety.  She was also continued on her home medications: DuoNeb nebulizer every 4 hours, Symbicort 2 puffs twice daily.   Hyponatremia: Likely dilutional though  could not exclude SIADH. Na trended down to 121 from 131 on admission. Urine studies were notable for sodium 21, osmolality 145. SIADH possible given her chronic lung disease. Sodium improved with free water restriction and IV fluids to 127 on day of discharge. Please recheck at the time of follow-up.  Tobacco abuse: She was continued on nicotine 14mg /patch given that she smoked half a pack per day per report. She was advised to limit her tobacco use throughout her hospital stay.  Discharge Vitals:   BP 121/67 mmHg  Pulse 99  Temp(Src) 97.8 F (36.6 C) (Oral)  Resp 22  Ht 5\' 1"  (1.549 m)  Wt 116 lb (52.617 kg)  BMI 21.93 kg/m2  SpO2 97%  Discharge Labs:  Results for orders placed or performed during the hospital encounter of 03/13/15 (from the past 24 hour(s))  Osmolality, urine      Status: Abnormal   Collection Time: 03/15/15  9:49 AM  Result Value Ref Range   Osmolality, Ur 145 (L) 390 - 1090 mOsm/kg  Sodium, urine, random     Status: None   Collection Time: 03/15/15  9:50 AM  Result Value Ref Range   Sodium, Ur 21 mmol/L  Basic metabolic panel     Status: Abnormal   Collection Time: 03/16/15  2:00 AM  Result Value Ref Range   Sodium 127 (L) 135 - 145 mmol/L   Potassium 3.9 3.5 - 5.1 mmol/L   Chloride 92 (L) 101 - 111 mmol/L   CO2 27 22 - 32 mmol/L   Glucose, Bld 86 65 - 99 mg/dL   BUN 9 6 - 20 mg/dL   Creatinine, Ser 1.61 0.44 - 1.00 mg/dL   Calcium 9.1 8.9 - 09.6 mg/dL   GFR calc non Af Amer >60 >60 mL/min   GFR calc Af Amer >60 >60 mL/min   Anion gap 8 5 - 15    Signed: Beather Arbour, MD 03/19/2015, 3:34 PM    Services Ordered on Discharge: None Equipment Ordered on Discharge: None

## 2015-03-14 NOTE — Progress Notes (Signed)
Patient is resting comfortably on nasal cannula with no signs of respiratory distress. BIPAP not placed on patient at this time. RT will continue to monitor.

## 2015-03-14 NOTE — Progress Notes (Addendum)
Subjective: This AM, her daughter is present at bedside. The patient reports to [place] productive cough which has been an ongoing issue. She also expressed her concerns about dropping her oxygen too low, and we explained the reasoning behind the 88-92% parameters that upset her yesterday.    Objective: Vital signs in last 24 hours: Filed Vitals:   03/14/15 0737 03/14/15 0936 03/14/15 1151 03/14/15 1243  BP: 149/69  124/45   Pulse:   105   Temp:   97.6 F (36.4 C)   TempSrc:   Oral   Resp: 21  19   Height:      Weight:      SpO2: 97% 99% 97% 93%   Weight change:   Intake/Output Summary (Last 24 hours) at 03/14/15 1608 Last data filed at 03/14/15 1602  Gross per 24 hour  Intake    680 ml  Output   1025 ml  Net   -345 ml   General: elderly Caucasian female, resting in bed, 4LO2 by [place] HEENT: PERRL, EOMI, no scleral icterus Cardiac: tachycardic, no rubs, murmurs or gallops Pulm: poor airflow throughout all posterior lung fields Abd: soft, nontender, nondistended, BS present Ext: warm and well perfused, no pedal edema Neuro: responds to questions appropriately; moving all extremities freely   Lab Results: Basic Metabolic Panel:  Recent Labs Lab 03/13/15 0846 03/14/15 0258  NA 131* 129*  K 4.5 3.9  CL 93* 91*  CO2 26 28  GLUCOSE 145* 151*  BUN <5* 5*  CREATININE 0.62 0.61  CALCIUM 9.5 9.0   CBC:  Recent Labs Lab 03/13/15 0846 03/14/15 0957  WBC 15.6* 17.0*  NEUTROABS 11.0* 15.7*  HGB 14.1 12.9  HCT 41.3 37.8  MCV 88.1 87.1  PLT 364 316   Cardiac Enzymes:  Recent Labs Lab 03/13/15 0846 03/13/15 1727  TROPONINI <0.03 <0.03   Micro Results: Recent Results (from the past 240 hour(s))  Blood culture (routine x 2)     Status: None (Preliminary result)   Collection Time: 03/13/15 10:30 AM  Result Value Ref Range Status   Specimen Description BLOOD RIGHT ANTECUBITAL  Final   Special Requests BOTTLES DRAWN AEROBIC AND ANAEROBIC 5CC  Final   Culture NO  GROWTH < 24 HOURS  Final   Report Status PENDING  Incomplete  Blood culture (routine x 2)     Status: None (Preliminary result)   Collection Time: 03/13/15 10:35 AM  Result Value Ref Range Status   Specimen Description BLOOD LEFT ANTECUBITAL  Final   Special Requests BOTTLES DRAWN AEROBIC AND ANAEROBIC 5CC  Final   Culture NO GROWTH < 24 HOURS  Final   Report Status PENDING  Incomplete  MRSA PCR Screening     Status: None   Collection Time: 03/13/15 12:46 PM  Result Value Ref Range Status   MRSA by PCR NEGATIVE NEGATIVE Final    Comment:        The GeneXpert MRSA Assay (FDA approved for NASAL specimens only), is one component of a comprehensive MRSA colonization surveillance program. It is not intended to diagnose MRSA infection nor to guide or monitor treatment for MRSA infections.    Studies/Results: X-ray Chest Pa And Lateral  03/14/2015   CLINICAL DATA:  Difficulty breathing  EXAM: CHEST - 2 VIEW  COMPARISON:  03/13/2015  FINDINGS: Cardiac shadow is stable. The lungs are hyperinflated but stable. There is been interval clearing in the right lung base when compared with the prior exam. No effusion or pneumothorax is  seen. Old rib fractures on the left are noted. Aortic calcifications are seen.  IMPRESSION: Stable emphysematous changes without acute abnormality.  Interval clearing in the right lung base.  Imaging findings of potential clinical significance:  Aortic Atherosclerosis   Electronically Signed   By: Alcide Clever M.D.   On: 03/14/2015 08:22   Dg Chest Port 1 View  03/13/2015   CLINICAL DATA:  Shortness of breath.  EXAM: PORTABLE CHEST - 1 VIEW  COMPARISON:  12/12/2014  FINDINGS: There is hyperinflation of the lungs compatible with COPD. Heart is normal size. Patchy opacity in the right lung base. Cannot exclude pneumonia. Left lung is clear. No effusions or acute bony abnormality.  IMPRESSION: Emphysema/COPD.  Patchy right basilar airspace opacity medially concerning for  pneumonia.   Electronically Signed   By: Charlett Nose M.D.   On: 03/13/2015 08:49   Medications: I have reviewed the patient's current medications. Scheduled Meds: . acetylcysteine  2 mL Nebulization Q4H  . ALPRAZolam  0.5 mg Oral q morning - 10a  . antiseptic oral rinse  7 mL Mouth Rinse BID  . aspirin EC  81 mg Oral Daily  . budesonide (PULMICORT) nebulizer solution  0.25 mg Nebulization BID  . enoxaparin (LOVENOX) injection  40 mg Subcutaneous Q24H  . guaiFENesin  600 mg Oral BID  . ipratropium-albuterol  3 mL Nebulization Q4H  . levofloxacin (LEVAQUIN) IV  500 mg Intravenous Q24H  . methylPREDNISolone (SOLU-MEDROL) injection  60 mg Intravenous Q6H  . nicotine  14 mg Transdermal Daily   Continuous Infusions:  PRN Meds:.albuterol, LORazepam Assessment/Plan:  Acute-on-chronic respiratory failure: ABG on admission consistent with acute hypercapnic respiratory failure though the report of her symptoms appeared consistent with a progressive process. She does have a history of mucous plugging which is a possibility. Worsening COPD also possible given her ongoing smoking though she is without wheezing. Chronic PE possible given immobility, ongoing tobacco abuse, though no recent surgeries, prior history. Worsening diastolic heart failure less likely in the absence of physical exam findings. CXR this AM reassuring. Pulmonology was consulted as she is followed by them as an outpatient and agreed with current management with recommendations to transfer to telemetry. -Continue weaning to home O2 as tolerated -Continue Solumedrol 125mg  q6h as needed with plan to taper to oral prednisone -Continue Duonebs q4h -Continue albuterol 2mg  as needed for wheezing -Continue Pulmicort 0.25mg  twice daily -Continue home Xanax 0.25mg  along with Ativan 0.5mg  IV every 8 hours as needed for anxiety -Continue Levaquin 500mg  IV Marcell.Ricker 2/5] -Continue Mucomyst -Follow-up echo -Check D-dimer to r/o chronic  PE  Hyponatremia: Na 129 today, down from 131 on admission. Per daughter, she has had decreased PO intake over the last 7-14 days though she is tolerating PO intake well in the hospital. -Repeat BMET  Tobacco abuse: She reports smoking 1/2 ppd.  -Give nicotine 14mg /patch  #FEN:  -Diet: Heart Healthy  #DVT prophylaxis: Lovenox  #CODE STATUS: FULL CODE -Defer to daughter Kalman Jewels [[phone]] if patients lacks decision-making capacity -Confirmed with patient on admission  Dispo: Disposition is deferred at this time, awaiting improvement of current medical problems.    The patient does have a current PCP [name], MD) and does not need an [facility] hospital follow-up appointment after discharge.  The patient does not know have transportation limitations that hinder transportation to clinic appointments.  .Services Needed at time of discharge: Y = Yes, Blank = No PT:   OT:   RN:   Equipment:  Other:     LOS: 1 day   Beather Arbour, MD 03/14/2015, 4:08 PM

## 2015-03-14 NOTE — Consult Note (Signed)
Name: [name] MRN: [id number] DOB: April 09, 1946    ADMISSION DATE:  03/13/2015 CONSULTATION DATE:  03/14/2015  REFERRING MD :  Cyndie Chime  CHIEF COMPLAINT:  Respiratory distress ? Mucous plugs   HISTORY OF PRESENT ILLNESS:  69 year old 40-pack-year smoker with severe COPD followed by my partner Dr. Maple Hudson, maintained on 2 L oxygen. Her outpatient regimen was Symbicort, Xopenex nebs and Lasix as needed for pedal edema-so presume that she has a degree of cor pulmonale. On her last office visit 5/26, she was switched to Anoro -as a trial (LABA/LAMA combination). She has been intubated twice in 2000 400,008 PFTs from 2012 shows severe airway obstruction with FEV1 of 0.64-33% and DLCO of 45%. She also has severe anxiety and is maintained on Xanax daily She stopped Anoro since this did not work. She was admitted 6/22 with respiratory distress and required transient BiPAP. Chest x-ray showed fleeting right lower lobe infiltrate which cleared on 6/23. She denies significant mucus production-but wonders if she has a mucous plug Cardiac evaluation showed negative troponins We are consulted to assist with management  PAST MEDICAL HISTORY :   has a past medical history of Chronic airway obstruction, not elsewhere classified; Hypertension; Ventilator dependent (2004; 2008); Pneumonia; H/O chronic bronchitis; and Shortness of breath (10/08/11).  has past surgical history that includes Oophorectomy (1986); Vaginal hysterectomy (1978); and Dilation and curettage of uterus (1970's). Prior to Admission medications   Medication Sig Start Date End Date Taking? Authorizing Provider  ALPRAZolam (XANAX) 0.5 MG tablet TAKE 1 TABLET THREE TIMES DAILY AS NEEDED FOR ANXIETY  OR  SLEEP 12/13/14  Yes Waymon Budge, MD  aspirin 81 MG tablet Take 81 mg by mouth every other day. Take one tablet every other day   Yes Historical Provider, MD  budesonide-formoterol (SYMBICORT) 160-4.5 MCG/ACT inhaler 2 puffs then  rinse mouth, twice daily 08/02/14  Yes Clinton D Young, MD  Ipratropium-Albuterol (COMBIVENT) 20-100 MCG/ACT AERS respimat Inhale 1 puff into the lungs every 6 (six) hours. 09/05/14  Yes Waymon Budge, MD  ipratropium-albuterol (DUONEB) 0.5-2.5 (3) MG/3ML SOLN Take 3 mLs by nebulization 2 (two) times daily. DX 496 03/14/14 04/15/15 Yes Clinton D Young, MD  lisinopril (PRINIVIL,ZESTRIL) 20 MG tablet Take 20 mg by mouth daily.   Yes Historical Provider, MD  Multiple Vitamins-Minerals (PRESERVISION AREDS 2 PO) Take 2 capsules by mouth daily.   Yes Historical Provider, MD   No Known Allergies  FAMILY HISTORY:  family history includes Emphysema in her father; Liver cancer in her mother. SOCIAL HISTORY:  reports that she has been smoking Cigarettes.  She has a 8 pack-year smoking history. She has never used smokeless tobacco. She reports that she drinks about 2.4 oz of alcohol per week. She reports that she does not use illicit drugs.  REVIEW OF SYSTEMS:   Constitutional: Negative for fever, chills, weight loss, malaise/fatigue and diaphoresis.  HENT: Negative for hearing loss, ear pain, nosebleeds, congestion, sore throat, neck pain, tinnitus and ear discharge.   Eyes: Negative for blurred vision, double vision, photophobia, pain, discharge and redness.  Respiratory: Positive for cough, shortness of breath, wheezing and stridor.  NO  hemoptysis, sputum production, Cardiovascular: Negative for chest pain, palpitations, orthopnea, claudication, leg swelling and PND.  Gastrointestinal: Negative for heartburn, nausea, vomiting, abdominal pain, diarrhea, constipation, blood in stool and melena.  Genitourinary: Negative for dysuria, urgency, frequency, hematuria and flank pain.  Musculoskeletal: Negative for myalgias, back pain, joint pain and falls.  Skin: Negative for itching and  rash.  Neurological: Negative for  tremors, sensory change, speech change, focal weakness, seizures, loss of consciousness,  weakness and headaches.  Endo/Heme/Allergies: Negative for environmental allergies and polydipsia. Does  bruise/bleed easily.  SUBJECTIVE:   VITAL SIGNS: Temp:  [97.6 F (36.4 C)-98.8 F (37.1 C)] 97.6 F (36.4 C) (06/23 1151) Pulse Rate:  [105-128] 105 (06/23 1151) Resp:  [16-40] 19 (06/23 1151) BP: (91-149)/(40-96) 124/45 mmHg (06/23 1151) SpO2:  [93 %-100 %] 93 % (06/23 1243) FiO2 (%):  [40 %] 40 % (06/22 1302) Weight:  [112 lb 14 oz (51.2 kg)] 112 lb 14 oz (51.2 kg) (06/23 0600)  PHYSICAL EXAMINATION: Gen. Pleasant, thin, in no distress, anxious affect ENT - no lesions, no post nasal drip Neck: No JVD, no thyromegaly, no carotid bruits Lungs: no use of accessory muscles, no dullness to percussion, decreased bilateral without rales, scattered rhonchi  Cardiovascular: Rhythm regular, heart sounds  normal, no murmurs, no peripheral edema Abdomen: soft and non-tender, no hepatosplenomegaly, BS normal. Musculoskeletal: No deformities, no cyanosis or clubbing Neuro:  alert, non focal Skin:  Warm, easy bruising ,no lesions/ rash    Recent Labs Lab 03/13/15 0846 03/14/15 0258  NA 131* 129*  K 4.5 3.9  CL 93* 91*  CO2 26 28  Ibarra <5* 5*  CREATININE 0.62 0.61  GLUCOSE 145* 151*    Recent Labs Lab 03/13/15 0846 03/14/15 0957  HGB 14.1 12.9  HCT 41.3 37.8  WBC 15.6* 17.0*  PLT 364 316   X-ray Chest Pa And Lateral  03/14/2015   CLINICAL DATA:  Difficulty breathing  EXAM: CHEST - 2 VIEW  COMPARISON:  03/13/2015  FINDINGS: Cardiac shadow is stable. The lungs are hyperinflated but stable. There is been interval clearing in the right lung base when compared with the prior exam. No effusion or pneumothorax is seen. Old rib fractures on the left are noted. Aortic calcifications are seen.  IMPRESSION: Stable emphysematous changes without acute abnormality.  Interval clearing in the right lung base.  Imaging findings of potential clinical significance:  Aortic Atherosclerosis    Electronically Signed   By: Alcide Clever M.D.   On: 03/14/2015 08:22   Dg Chest Port 1 View  03/13/2015   CLINICAL DATA:  Shortness of breath.  EXAM: PORTABLE CHEST - 1 VIEW  COMPARISON:  12/12/2014  FINDINGS: There is hyperinflation of the lungs compatible with COPD. Heart is normal size. Patchy opacity in the right lung base. Cannot exclude pneumonia. Left lung is clear. No effusions or acute bony abnormality.  IMPRESSION: Emphysema/COPD.  Patchy right basilar airspace opacity medially concerning for pneumonia.   Electronically Signed   By: Charlett Nose M.D.   On: 03/13/2015 08:49    ASSESSMENT / PLAN:  Acute exacerbation of COPD - agree with IV steroids and broncho-dilators, and tapered to oral prednisone within 24 hours  Acute hypercarbic respiratory failure-requiring transient BiPAP, now resolved  Right lower lobe infiltrate-that cleared within 24 hours, next pneumonia quite unlikely, more likely related to mucous plugging, okay to continue Levaquin to treat for bronchitis and COPD flare. Also note negative procalcitonin  Severe anxiety- agree with short-acting benzo such as alprazolam,does she need SSRI ? She expressed fear of dying and required reassurance. She does not seem ready for a palliative conversation but I wonder if frank discussion of some of these issues and letting her to express her fears would be of some benefit.  Cyril Mourning MD. Tonny Bollman. [facility] Pager [phone] If no  response call [phone]   03/14/2015, 12:57 PM

## 2015-03-14 NOTE — Progress Notes (Signed)
ANTIBIOTIC CONSULT NOTE - INITIAL  Pharmacy Consult for levaquin Indication: COPD exacerbation/bronchitis  No Known Allergies  Patient Measurements: Height:  (154.9 cm) Weight: 112 lb 14 oz (51.2 kg) IBW/kg (Calculated) : 47.8  Vital Signs: Temp: 97.6 F (36.4 C) (06/23 1151) Temp Source: Oral (06/23 1151) BP: 124/45 mmHg (06/23 1151) Pulse Rate: 105 (06/23 1151) Intake/Output from previous day: 06/22 0701 - 06/23 0700 In: 480 [P.O.:480] Out: 1100 [Urine:1100] Intake/Output from this shift: Total I/O In: 120 [P.O.:120] Out: 275 [Urine:275]  Labs:  Recent Labs  03/13/15 0846 03/14/15 0258 03/14/15 0957  WBC 15.6*  --  17.0*  HGB 14.1  --  12.9  PLT 364  --  316  CREATININE 0.62 0.61  --    Estimated Creatinine Clearance: 50.1 mL/min (by C-G formula based on Cr of 0.61). No results for input(s): VANCOTROUGH, VANCOPEAK, VANCORANDOM, GENTTROUGH, GENTPEAK, GENTRANDOM, TOBRATROUGH, TOBRAPEAK, TOBRARND, AMIKACINPEAK, AMIKACINTROU, AMIKACIN in the last 72 hours.   Microbiology: Recent Results (from the past 720 hour(s))  Blood culture (routine x 2)     Status: None (Preliminary result)   Collection Time: 03/13/15 10:30 AM  Result Value Ref Range Status   Specimen Description BLOOD RIGHT ANTECUBITAL  Final   Special Requests BOTTLES DRAWN AEROBIC AND ANAEROBIC 5CC  Final   Culture NO GROWTH < 24 HOURS  Final   Report Status PENDING  Incomplete  Blood culture (routine x 2)     Status: None (Preliminary result)   Collection Time: 03/13/15 10:35 AM  Result Value Ref Range Status   Specimen Description BLOOD LEFT ANTECUBITAL  Final   Special Requests BOTTLES DRAWN AEROBIC AND ANAEROBIC 5CC  Final   Culture NO GROWTH < 24 HOURS  Final   Report Status PENDING  Incomplete  MRSA PCR Screening     Status: None   Collection Time: 03/13/15 12:46 PM  Result Value Ref Range Status   MRSA by PCR NEGATIVE NEGATIVE Final    Comment:        The GeneXpert MRSA Assay  (FDA approved for NASAL specimens only), is one component of a comprehensive MRSA colonization surveillance program. It is not intended to diagnose MRSA infection nor to guide or monitor treatment for MRSA infections.     Medical History: Past Medical History  Diagnosis Date  . Chronic airway obstruction, not elsewhere classified   . Hypertension   . Ventilator dependent 2004; 2008    "for mucous plugs"  . Pneumonia   . H/O chronic bronchitis   . Shortness of breath 10/08/11    "all the time last 1 1/2 wk"    Medications:  Prescriptions prior to admission  Medication Sig Dispense Refill Last Dose  . ALPRAZolam (XANAX) 0.5 MG tablet TAKE 1 TABLET THREE TIMES DAILY AS NEEDED FOR ANXIETY  OR  SLEEP 60 tablet 5 Past Week at Unknown time  . aspirin 81 MG tablet Take 81 mg by mouth every other day. Take one tablet every other day   03/12/2015 at Unknown time  . budesonide-formoterol (SYMBICORT) 160-4.5 MCG/ACT inhaler 2 puffs then rinse mouth, twice daily 1 Inhaler prn 03/12/2015 at Unknown time  . Ipratropium-Albuterol (COMBIVENT) 20-100 MCG/ACT AERS respimat Inhale 1 puff into the lungs every 6 (six) hours. 3 Inhaler 3 03/12/2015 at Unknown time  . ipratropium-albuterol (DUONEB) 0.5-2.5 (3) MG/3ML SOLN Take 3 mLs by nebulization 2 (two) times daily. DX 496 180 mL 11 03/13/2015 at Unknown time  . lisinopril (PRINIVIL,ZESTRIL) 20 MG tablet Take 20 mg  by mouth daily.   03/12/2015 at Unknown time  . Multiple Vitamins-Minerals (PRESERVISION AREDS 2 PO) Take 2 capsules by mouth daily.   03/12/2015 at Unknown time   Scheduled:  . acetylcysteine  2 mL Nebulization Q4H  . ALPRAZolam  0.5 mg Oral q morning - 10a  . antiseptic oral rinse  7 mL Mouth Rinse BID  . aspirin EC  81 mg Oral Daily  . budesonide (PULMICORT) nebulizer solution  0.25 mg Nebulization BID  . enoxaparin (LOVENOX) injection  40 mg Subcutaneous Q24H  . guaiFENesin  600 mg Oral BID  . ipratropium-albuterol  3 mL Nebulization  Q4H  . methylPREDNISolone (SOLU-MEDROL) injection  60 mg Intravenous Q6H  . nicotine  14 mg Transdermal Daily    Assessment: 69 yo female with respiratory distress to begin levaquin for COPD exacerbation . Pharmacy has been consulted to dose levaquin. WBC= 17, afebrile, SCr= 0.61 and CrCl ~ 50. Levaquin 500mg  IV was given on 6/22.   6/22 Levaquin>>  6/22 blood x2  Plan:  -Levaquin 500mg  IV q24hr -Will follow renal function, cultures and clinical progress  Harland German, Pharm D 03/14/2015 2:49 PM

## 2015-03-15 ENCOUNTER — Telehealth: Payer: Self-pay | Admitting: Internal Medicine

## 2015-03-15 DIAGNOSIS — E871 Hypo-osmolality and hyponatremia: Secondary | ICD-10-CM

## 2015-03-15 DIAGNOSIS — F411 Generalized anxiety disorder: Secondary | ICD-10-CM | POA: Insufficient documentation

## 2015-03-15 LAB — BASIC METABOLIC PANEL
ANION GAP: 10 (ref 5–15)
BUN: 8 mg/dL (ref 6–20)
CALCIUM: 8.8 mg/dL — AB (ref 8.9–10.3)
CO2: 26 mmol/L (ref 22–32)
CREATININE: 0.58 mg/dL (ref 0.44–1.00)
Chloride: 87 mmol/L — ABNORMAL LOW (ref 101–111)
GFR calc Af Amer: >60 mL/min (ref >60)
GFR calc non Af Amer: >60 mL/min (ref >60)
Glucose, Bld: 132 mg/dL — ABNORMAL HIGH (ref 65–99)
Potassium: 4.6 mmol/L (ref 3.5–5.1)
Sodium: 123 mmol/L — ABNORMAL LOW (ref 135–145)

## 2015-03-15 LAB — D-DIMER, QUANTITATIVE: D-Dimer, Quant: 0.41 ug/mL-FEU (ref 0.00–0.48)

## 2015-03-15 LAB — OSMOLALITY, URINE: Osmolality, Ur: 145 mOsm/kg — ABNORMAL LOW (ref 390–1090)

## 2015-03-15 LAB — SODIUM, URINE, RANDOM: Sodium, Ur: 21 mmol/L

## 2015-03-15 MED ORDER — LORAZEPAM 0.5 MG PO TABS: 0.5000 mg | ORAL_TABLET | Freq: Four times a day (QID) | ORAL | Status: DC | PRN

## 2015-03-15 MED ORDER — LEVOFLOXACIN 500 MG PO TABS: 500.0000 mg | ORAL_TABLET | Freq: Every day | ORAL | Status: DC

## 2015-03-15 MED ORDER — PREDNISONE 10 MG PO TABS
ORAL_TABLET | ORAL | Status: DC
Start: 2015-03-15 — End: 2015-03-21

## 2015-03-15 MED ORDER — GUAIFENESIN ER 600 MG PO TB12: 600.0000 mg | ORAL_TABLET | Freq: Two times a day (BID) | ORAL | Status: DC

## 2015-03-15 MED ORDER — SODIUM CHLORIDE 0.9 % IV SOLN
INTRAVENOUS | Status: DC
  Administered 2015-03-15: 75 mL/h via INTRAVENOUS

## 2015-03-15 MED ORDER — LORAZEPAM 0.5 MG PO TABS
0.5000 mg | ORAL_TABLET | Freq: Four times a day (QID) | ORAL | Status: DC | PRN
  Administered 2015-03-15 – 2015-03-16 (×2): 0.5 mg via ORAL
  Filled 2015-03-15 (×2): qty 1

## 2015-03-15 MED ORDER — ACETAMINOPHEN 325 MG PO TABS
650.0000 mg | ORAL_TABLET | Freq: Four times a day (QID) | ORAL | Status: DC | PRN
  Administered 2015-03-15: 650 mg via ORAL
  Filled 2015-03-15: qty 2

## 2015-03-15 MED ORDER — LORAZEPAM 2 MG/ML IJ SOLN: 0.5000 mg | Freq: Every day | INTRAMUSCULAR | Status: DC

## 2015-03-15 MED ORDER — LORAZEPAM 0.5 MG PO TABS
0.5000 mg | ORAL_TABLET | Freq: Every day | ORAL | Status: DC
  Administered 2015-03-15: 0.5 mg via ORAL
  Filled 2015-03-15: qty 1

## 2015-03-15 MED ORDER — LEVOFLOXACIN 500 MG PO TABS
500.0000 mg | ORAL_TABLET | Freq: Every day | ORAL | Status: DC
  Administered 2015-03-15 – 2015-03-16 (×2): 500 mg via ORAL
  Filled 2015-03-15 (×2): qty 1

## 2015-03-15 NOTE — Consult Note (Signed)
   [facility] CM Inpatient Consult   03/15/2015  [name] 07-Aug-1946 [id number] Referral received. Patient evaluated for [facility] Care Management services. Patient is not eligible for [facility] Care Management services because unfortunately, patient's [employer] Medicare is not in the delegation for [facility] Care Management services at this time. Will update inpatient care manager of outcome.  Thank you for the referral.  For questions, please contact: Charlesetta Shanks, RN BSN CCM [facility]  [phone] business mobile phone

## 2015-03-15 NOTE — Progress Notes (Signed)
Saw order for rolling walker for home. Alerted jermaine w [facility] that pt for dc on 6-25. Has home o2 w [facility].

## 2015-03-15 NOTE — Telephone Encounter (Signed)
Dr. Allena Katz called and wants this patient to be seen for hospital follow up by Dr. Maple Hudson.  Dr. Maple Hudson, when can we put this patient on your schedule?  Dr. Allena Katz is wanting a call back within the next hour, he is releasing patient in 1 hour.

## 2015-03-15 NOTE — Progress Notes (Signed)
Patient ID: [name], female   DOB: 1946/09/14, 69 y.o.   MRN: [id number] Medicine attending discharge note: I personally examined this patient on the day of discharge and discussed discharge management plan with the patient and one of her sons. I attest to the accuracy of the discharge evaluation and plan as recorded by resident physician Dr. Alvina Chou.  Clinical summary: 69 year old woman with advanced obstructive airway disease, oxygen dependent on 2. 5-3 liters of nasal cannula oxygen at home. She has a prior history of having had to be intubated for acute respiratory failure secondary in both cases due to mucus plugging. She presented to her lung specialist 2 days prior to this admission with a 1 month history of increasing cough and dyspnea with no obvious precipitating factors. She was given a dose of parenteral steroids. She was given Xopenex . She had no improvement in her breathing and was admitted on 03/13/2015 for further evaluation.  Initial exam by Dr. Allena Katz: Blood pressure 103/86, pulse 132, temperature 98.5 F (36.9 C), temperature source Axillary, resp. rate 29, weight 116 lb (52.617 kg), SpO2 100 %. General: Elderly Caucasian female, anxious appearing resting in bed, increased work of breathing HEENT: PERRL, EOMI, no scleral icterus, wearing BiPAP mask Cardiac: tachycardic, no rubs, murmurs or gallops Pulm: Coarse breath sounds noted on the posterior lung fields On my exam: Poor air movement with markedly diminished breath sounds bilaterally over hyperresonant lungs.  Hospital course: She initially required BiPAP in the emergency department to maintain adequate oxygenation. We subsequently were able to titrate nasal cannula oxygen from 5 L back to her baseline of 2 L/m. Inhaled bronchodilators were continued. Initial lactic acid 2.4, repeat 1.6. Pro-calcitonin undetectable. White count elevated but likely effect of parenteral steroids. She was started on a course of  antibiotics with Levaquin. She was given parenteral steroids in the emergency department. She was transitioned to oral prednisone. An initial chest radiograph suggested early patchy infiltrate at the right lung base. She had no fever  Follow-up film 24 hours later showed no infiltrate or effusion. There was no indication that she was having a cardiac event.  troponin levels were checked and undetectable. Cardiogram showed sinus tachycardia with nonspecific T-wave changes. No acute ischemic change and no arrhythmias. BNP 23. Echocardiogram done 03/14/2015 showed normal left ventricular systolic function estimated ejection fraction 60-65 percent with normal wall motion and grade 1 diastolic dysfunction. Right ventricular function normal. Pulmonic valve not well visualized. Right Atrium normal size. A  d-dimer test was done as a screen for chronic pulmonary emboli and was  normal at 0.41 units. We asked pulmonary medicine to evaluate the patient given her tenuous status. Consultant agreed with current management. No indication for bronchoscopy.  She developed asymptomatic hyponatremia with sodium 123 on day of initial planned discharge. She gave a prior history of low sodiums which is documented. She appeared euvolemic. BUN 8, creatinine 0.6. 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 No pulmonary mass obvious on chest x-ray. She was not receiving any diuretics. She was started on normal saline at 75 mL per hour  pending results of the urine sodium analysis. Nurse reported she was drinking large quantities of water. This is likely a dilutional hyponatremia although finding sodium in her urine is  an inappropriate physiologic response. She was put on fluid restriction. She was kept overnight.  Follow-up sodium level on June 25 was 127.  Disposition: Condition overall stable at time of discharge. She will follow-up with her pulmonologist  Dr. Jetty Duhamel. And her primary care physician Dr. Antony Haste. We will need to check electrolytes again within the next few days. Restrict fluid intake. She will continue home oxygen.  Complications: Hyponatremia. Dilutional versus SIADH She is quite anxious. She has a chronic progressive illness. End of life and quality of life discussions are appropriate and will be deferred to her primary care provider and pulmonologist.

## 2015-03-15 NOTE — Progress Notes (Signed)
Sent ref to cardiac/pul rehab that pt needs outpt pulm rehab

## 2015-03-15 NOTE — Telephone Encounter (Signed)
Called spoke with Dr. Allena Katz. Appt scheduled for 6/30 at 11:15 okay per CDY.

## 2015-03-15 NOTE — Progress Notes (Signed)
Subjective: This AM, she worked with PT and reported feeling it was difficult for her to breath. During interview, O2 sats remained in the 90s on 2L O2. We encouraged her that we would continue the same management even when she went home though she expressed concerns about her inability to tolerate ambulation.  Objective: Vital signs in last 24 hours: Filed Vitals:   03/15/15 0326 03/15/15 0428 03/15/15 0600 03/15/15 0754  BP:    135/102  Pulse:    95  Temp: 97.3 F (36.3 C)   97.5 F (36.4 C)  TempSrc: Oral   Oral  Resp:    21  Height:      Weight:   113 lb 12.1 oz (51.6 kg)   SpO2:  96%  91%   Weight change: -2 lb 3.9 oz (-1.017 kg)  Intake/Output Summary (Last 24 hours) at 03/15/15 0831 Last data filed at 03/15/15 0600  Gross per 24 hour  Intake    200 ml  Output   1000 ml  Net   -800 ml   General: elderly Caucasian female, resting in bed, 2LO2 by [place] HEENT: PERRL, EOMI, no scleral icterus Cardiac: tachycardic, no rubs, murmurs or gallops Pulm: poor airflow throughout all posterior lung fields Abd: soft, nontender, nondistended, BS present Ext: warm and well perfused, no pedal edema Neuro: responds to questions appropriately; moving all extremities freely   Lab Results: Basic Metabolic Panel:  Recent Labs Lab 03/14/15 0258 03/15/15 0556  NA 129* 123*  K 3.9 4.6  CL 91* 87*  CO2 28 26  GLUCOSE 151* 132*  BUN 5* 8  CREATININE 0.61 0.58  CALCIUM 9.0 8.8*   CBC:  Recent Labs Lab 03/13/15 0846 03/14/15 0957  WBC 15.6* 17.0*  NEUTROABS 11.0* 15.7*  HGB 14.1 12.9  HCT 41.3 37.8  MCV 88.1 87.1  PLT 364 316   Cardiac Enzymes:  Recent Labs Lab 03/13/15 0846 03/13/15 1727  TROPONINI <0.03 <0.03   Micro Results: Recent Results (from the past 240 hour(s))  Blood culture (routine x 2)     Status: None (Preliminary result)   Collection Time: 03/13/15 10:30 AM  Result Value Ref Range Status   Specimen Description BLOOD RIGHT ANTECUBITAL  Final   Special Requests BOTTLES DRAWN AEROBIC AND ANAEROBIC 5CC  Final   Culture NO GROWTH < 24 HOURS  Final   Report Status PENDING  Incomplete  Blood culture (routine x 2)     Status: None (Preliminary result)   Collection Time: 03/13/15 10:35 AM  Result Value Ref Range Status   Specimen Description BLOOD LEFT ANTECUBITAL  Final   Special Requests BOTTLES DRAWN AEROBIC AND ANAEROBIC 5CC  Final   Culture NO GROWTH < 24 HOURS  Final   Report Status PENDING  Incomplete  MRSA PCR Screening     Status: None   Collection Time: 03/13/15 12:46 PM  Result Value Ref Range Status   MRSA by PCR NEGATIVE NEGATIVE Final    Comment:        The GeneXpert MRSA Assay (FDA approved for NASAL specimens only), is one component of a comprehensive MRSA colonization surveillance program. It is not intended to diagnose MRSA infection nor to guide or monitor treatment for MRSA infections.    Studies/Results: X-ray Chest Pa And Lateral  03/14/2015   CLINICAL DATA:  Difficulty breathing  EXAM: CHEST - 2 VIEW  COMPARISON:  03/13/2015  FINDINGS: Cardiac shadow is stable. The lungs are hyperinflated but stable. There is been interval  clearing in the right lung base when compared with the prior exam. No effusion or pneumothorax is seen. Old rib fractures on the left are noted. Aortic calcifications are seen.  IMPRESSION: Stable emphysematous changes without acute abnormality.  Interval clearing in the right lung base.  Imaging findings of potential clinical significance:  Aortic Atherosclerosis   Electronically Signed   By: Alcide Clever M.D.   On: 03/14/2015 08:22   Dg Chest Port 1 View  03/13/2015   CLINICAL DATA:  Shortness of breath.  EXAM: PORTABLE CHEST - 1 VIEW  COMPARISON:  12/12/2014  FINDINGS: There is hyperinflation of the lungs compatible with COPD. Heart is normal size. Patchy opacity in the right lung base. Cannot exclude pneumonia. Left lung is clear. No effusions or acute bony abnormality.  IMPRESSION:  Emphysema/COPD.  Patchy right basilar airspace opacity medially concerning for pneumonia.   Electronically Signed   By: Charlett Nose M.D.   On: 03/13/2015 08:49   Medications: I have reviewed the patient's current medications. Scheduled Meds: . acetylcysteine  2 mL Nebulization Q4H  . antiseptic oral rinse  7 mL Mouth Rinse BID  . aspirin EC  81 mg Oral Daily  . budesonide (PULMICORT) nebulizer solution  0.25 mg Nebulization BID  . enoxaparin (LOVENOX) injection  40 mg Subcutaneous Q24H  . guaiFENesin  600 mg Oral BID  . ipratropium-albuterol  3 mL Nebulization Q4H  . levofloxacin (LEVAQUIN) IV  500 mg Intravenous Q24H  . lisinopril  20 mg Oral Daily  . nicotine  14 mg Transdermal Daily  . predniSONE  40 mg Oral Q breakfast   Continuous Infusions:  PRN Meds:.albuterol, LORazepam Assessment/Plan:  Acute-on-chronic respiratory failure: ABG on admission consistent with acute hypercapnic respiratory failure though the report of her symptoms appeared consistent with a progressive process which appears now to be a respiratory process given her echo findings yesterday. She does have a history of mucous plugging. Worsening COPD also possible given her ongoing smoking though she is without wheezing. Anxiety appears to be contributory to her report of dyspnea. Chronic PE unlikely with reassuring D-dimer. CXR this AM reassuring.  -Continue weaning to home O2 as tolerated -Start prednisone 40mg  -Continue Duonebs q4h -Continue albuterol 2mg  as needed for wheezing -Continue Pulmicort 0.25mg  twice daily -Start Ativan 0.5mg  q6h as needed for anxiety instead of Xanax and Ativan IV -Continue Levaquin 500mg  IV Marcell.Ricker 3/5] -Continue Mucomyst  Hyponatremia: Na 121 today, down from 131 on admission. Initially thought to be 2/2 decreased PO intake over the last 7-14 days per daughter though she is tolerating PO intake well in the hospital. SIADH possible given her euvolemic status and chronic respiratory  disease. Per her, she reports she has had prior issues with it in the past. -Check urine osm, Na -Continue NS @ 75cc/hr & repeat BMET tomorrow  Tobacco abuse: She reports smoking 1/2 ppd.  -Give nicotine 14mg /patch  #FEN:  -Diet: Heart Healthy  #DVT prophylaxis: Lovenox  #CODE STATUS: FULL CODE -Defer to daughter Kalman Jewels [[phone]] if patients lacks decision-making capacity -Confirmed with patient on admission  Dispo: Disposition is deferred at this time, awaiting improvement of current medical problems.    The patient does have a current PCP [name], MD) and does not need an [facility] hospital follow-up appointment after discharge.  The patient does not know have transportation limitations that hinder transportation to clinic appointments.  .Services Needed at time of discharge: Y = Yes, Blank = No PT:   OT:  RN:   Equipment:   Other:     LOS: 2 days   Beather Arbour, MD 03/15/2015, 8:31 AM

## 2015-03-15 NOTE — Evaluation (Signed)
Physical Therapy Evaluation Patient Details Name: [name] MRN: [id number] DOB: Jun 24, 1946 Today's Date: 03/15/2015   History of Present Illness  69 year old woman with advanced obstructive airway disease, oxygen dependent on 2. 5-3 liters of nasal cannula oxygen at home. She has a prior history of having to be intubated for acute respiratory failure secondary in both cases to mucus plugging. She presented to her lung specialist 2 days prior to this admission with a 1 month history of increasing cough and dyspnea with no obvious precipitating factors. She was given a dose of parenteral steroids. She was given Xopenex . She had no improvement in her breathing and is admitted for further evaluation  Clinical Impression  Pt moving well and educated for energy conservation, breathing technique and activity progression. Pt reports difficulty moving in community due to SOB and recommend rollator for energy conservation. Pt with decreased activity tolerance and gait who will benefit from acute therapy to maximize mobility, gait and function to decrease burden of care.     Follow Up Recommendations Other (comment);Outpatient PT (recommend pulmonary rehab)    Equipment Recommendations  Rolling walker with 5" wheels (rollator)    Recommendations for Other Services       Precautions / Restrictions Precautions Precautions: None Restrictions Weight Bearing Restrictions: No      Mobility  Bed Mobility Overal bed mobility: Modified Independent             General bed mobility comments: with HOB elevated  Transfers Overall transfer level: Modified independent                  Ambulation/Gait Ambulation/Gait assistance: Supervision Ambulation Distance (Feet): 150 Feet Assistive device: None Gait Pattern/deviations: Step-through pattern;Decreased stride length   Gait velocity interpretation: Below normal speed for age/gender General Gait Details: cues for breathing, speed  and 3 standing rest breaks throughout gait  Stairs            Wheelchair Mobility    Modified Rankin (Stroke Patients Only)       Balance                                             Pertinent Vitals/Pain Pain Assessment: No/denies pain  HR 103-115 sats 96% on 3L throughout    Home Living Family/patient expects to be discharged to:: Private residence Living Arrangements: Alone Available Help at Discharge: Family;Available PRN/intermittently Type of Home: House Home Access: Stairs to enter Entrance Stairs-Rails: Right Entrance Stairs-Number of Steps: 4 Home Layout: One level Home Equipment: Shower seat - built in      Prior Function Level of Independence: Independent         Comments: pt reports she does activities as she can and dgtr drives her sometimes     Hand Dominance        Extremity/Trunk Assessment   Upper Extremity Assessment: Overall WFL for tasks assessed           Lower Extremity Assessment: Overall WFL for tasks assessed      Cervical / Trunk Assessment: Normal  Communication   Communication: No difficulties  Cognition Arousal/Alertness: Awake/alert Behavior During Therapy: WFL for tasks assessed/performed Overall Cognitive Status: Within Functional Limits for tasks assessed                      General Comments  Exercises        Assessment/Plan    PT Assessment Patient needs continued PT services  PT Diagnosis Difficulty walking   PT Problem List Decreased activity tolerance;Decreased balance;Decreased knowledge of use of DME;Cardiopulmonary status limiting activity  PT Treatment Interventions Gait training;DME instruction;Stair training;Functional mobility training;Therapeutic activities;Patient/family education   PT Goals (Current goals can be found in the Care Plan section) Acute Rehab PT Goals Patient Stated Goal: return home PT Goal Formulation: With patient Time For Goal  Achievement: 03/29/15 Potential to Achieve Goals: Good    Frequency Min 3X/week   Barriers to discharge Decreased caregiver support      Co-evaluation               End of Session Equipment Utilized During Treatment: Oxygen Activity Tolerance: Patient tolerated treatment well Patient left: in chair;with call bell/phone within reach Nurse Communication: Mobility status         Time: 1610-9604 PT Time Calculation (min) (ACUTE ONLY): 27 min   Charges:   PT Evaluation $Initial PT Evaluation Tier I: 1 Procedure PT Treatments $Therapeutic Activity: 8-22 mins   PT G CodesDelorse Lek 03/15/2015, 10:10 AM Delaney Meigs, PT [phone]

## 2015-03-16 LAB — BASIC METABOLIC PANEL
ANION GAP: 8 (ref 5–15)
BUN: 9 mg/dL (ref 6–20)
CALCIUM: 9.1 mg/dL (ref 8.9–10.3)
CO2: 27 mmol/L (ref 22–32)
Chloride: 92 mmol/L — ABNORMAL LOW (ref 101–111)
Creatinine, Ser: 0.63 mg/dL (ref 0.44–1.00)
GFR calc non Af Amer: >60 mL/min (ref >60)
Glucose, Bld: 86 mg/dL (ref 65–99)
Potassium: 3.9 mmol/L (ref 3.5–5.1)
Sodium: 127 mmol/L — ABNORMAL LOW (ref 135–145)

## 2015-03-16 MED ORDER — NICOTINE 14 MG/24HR TD PT24: 14.0000 mg | MEDICATED_PATCH | Freq: Every day | TRANSDERMAL | Status: DC

## 2015-03-16 MED ORDER — SODIUM CHLORIDE 0.9 % IV SOLN
INTRAVENOUS | Status: DC
  Administered 2015-03-16: 07:00:00 via INTRAVENOUS

## 2015-03-16 NOTE — Progress Notes (Signed)
Subjective:  VSS.  Pt feels better today but still c/o SOB which I suspect is largely due to anxiety.  She is back to her normal 2L O2 [place] and SpO2: 98&.  She has been drinking less water and is drinking diet coke.    Objective: Vital signs in last 24 hours: Filed Vitals:   03/16/15 0744 03/16/15 0800 03/16/15 0904 03/16/15 0928  BP: 121/67   121/67  Pulse:      Temp: 97.8 F (36.6 C)     TempSrc: Oral     Resp: 22 22    Height:      Weight:      SpO2: 97% 96% 98%    Weight change: 2 lb 3.9 oz (1.017 kg)  Intake/Output Summary (Last 24 hours) at 03/16/15 1126 Last data filed at 03/16/15 1100  Gross per 24 hour  Intake 1768.75 ml  Output   2300 ml  Net -531.25 ml   General: elderly Caucasian female,sitting up in the chair  HEENT: EOMI, no scleral icterus Cardiac: mild tachycardic, no rubs, murmurs or gallops Pulm: improved airflow throughout lung fields, mild diffuse expiratory wheezes  Abd: soft, nontender, nondistended, BS present Ext: warm and well perfused, no pedal edema Skin: multiple ecchymosis on the arms  Neuro: responds to questions appropriately; moving all extremities freely   Lab Results: Basic Metabolic Panel:  Recent Labs Lab 03/15/15 0556 03/16/15 0200  NA 123* 127*  K 4.6 3.9  CL 87* 92*  CO2 26 27  GLUCOSE 132* 86  BUN 8 9  CREATININE 0.58 0.63  CALCIUM 8.8* 9.1   CBC:  Recent Labs Lab 03/13/15 0846 03/14/15 0957  WBC 15.6* 17.0*  NEUTROABS 11.0* 15.7*  HGB 14.1 12.9  HCT 41.3 37.8  MCV 88.1 87.1  PLT 364 316   Cardiac Enzymes:  Recent Labs Lab 03/13/15 0846 03/13/15 1727  TROPONINI <0.03 <0.03   Micro Results: Recent Results (from the past 240 hour(s))  Blood culture (routine x 2)     Status: None (Preliminary result)   Collection Time: 03/13/15 10:30 AM  Result Value Ref Range Status   Specimen Description BLOOD RIGHT ANTECUBITAL  Final   Special Requests BOTTLES DRAWN AEROBIC AND ANAEROBIC 5CC  Final   Culture NO  GROWTH 2 DAYS  Final   Report Status PENDING  Incomplete  Blood culture (routine x 2)     Status: None (Preliminary result)   Collection Time: 03/13/15 10:35 AM  Result Value Ref Range Status   Specimen Description BLOOD LEFT ANTECUBITAL  Final   Special Requests BOTTLES DRAWN AEROBIC AND ANAEROBIC 5CC  Final   Culture NO GROWTH 2 DAYS  Final   Report Status PENDING  Incomplete  MRSA PCR Screening     Status: None   Collection Time: 03/13/15 12:46 PM  Result Value Ref Range Status   MRSA by PCR NEGATIVE NEGATIVE Final    Comment:        The GeneXpert MRSA Assay (FDA approved for NASAL specimens only), is one component of a comprehensive MRSA colonization surveillance program. It is not intended to diagnose MRSA infection nor to guide or monitor treatment for MRSA infections.    Studies/Results: No results found. Medications: I have reviewed the patient's current medications. Scheduled Meds: . antiseptic oral rinse  7 mL Mouth Rinse BID  . aspirin EC  81 mg Oral Daily  . budesonide (PULMICORT) nebulizer solution  0.25 mg Nebulization BID  . enoxaparin (LOVENOX) injection  40 mg  Subcutaneous Q24H  . guaiFENesin  600 mg Oral BID  . ipratropium-albuterol  3 mL Nebulization Q4H  . levofloxacin  500 mg Oral Daily  . lisinopril  20 mg Oral Daily  . LORazepam  0.5 mg Oral QHS  . nicotine  14 mg Transdermal Daily  . predniSONE  40 mg Oral Q breakfast   Continuous Infusions: . sodium chloride 75 mL/hr at 03/16/15 0725   PRN Meds:.acetaminophen, albuterol, LORazepam Assessment/Plan:  Acute-on-chronic respiratory failure:  Improved breathing this AM with more air movement on exam.  She is back to her normal 2L O2 [place] at home.  Stable for d/c home today.  -cont prednisone taper  -cont Duonebs q4h, albuterol  as needed for wheezing -cont Ativan 0.5mg  q6h as needed for anxiety   Hyponatremia:  Improve today at 127 with IVF and free water restriction.   -will follow up with  PCP in a few days to recheck -reminded her to limit her intake of plain water   FEN:  -Diet: Heart Healthy  DVT prophylaxis: Lovenox  CODE STATUS: FULL CODE -Defer to daughter Kalman Jewels [[phone]] if patients lacks decision-making capacity -Confirmed with patient on admission  Dispo: Discharge today with improvement in sodium with close follow up with PCP.    The patient does have a current PCP [name], MD) and does not need an [facility] hospital follow-up appointment after discharge.    LOS: 3 days   Marrian Salvage, MD 03/16/2015, 11:26 AM

## 2015-03-16 NOTE — Discharge Instructions (Signed)
[facility] pulmonary outpatient rehab [phone]. Please have your sodium level checked by your primary physician on 6/29.

## 2015-03-16 NOTE — Progress Notes (Signed)
[id number] In with patient to discuss referral to outpatient pulmonary rehab. Patient refused to ambulated or to sit in chair at this time. Pulmonary rehab referral education completed and brochure given. Patient stated she currently attends [facility] at the [facility] on Tuesdays and Thursdays, but would consider dropping that program to attend pulmonary rehab. RN noted great need for COPD education including pursed lip breathing and compliance with oxygen prescription. Will send over referral/order for Dr. Maple Hudson to sign and will follow up with patient post discharge. Patient to be discharged today. PT also following patient and has recommended RW for home use.

## 2015-03-16 NOTE — Progress Notes (Signed)
Spoke with [facility] regarding the walker that pt was ordered for home.  Informed that wallker should be delivered to room shortly.    Smith, Cashtyn Pouliot Moore, RN  

## 2015-03-18 ENCOUNTER — Other Ambulatory Visit: Payer: Self-pay | Admitting: Internal Medicine

## 2015-03-18 LAB — CULTURE, BLOOD (ROUTINE X 2)
CULTURE: NO GROWTH
CULTURE: NO GROWTH

## 2015-03-19 ENCOUNTER — Telehealth: Payer: Self-pay | Admitting: Internal Medicine

## 2015-03-19 NOTE — Telephone Encounter (Signed)
Patient's daughter/ patient notified. Nothing further needed.

## 2015-03-19 NOTE — Telephone Encounter (Signed)
Mrs [name] needs to be seen by her PCP to look at fluid and diuretic management

## 2015-03-19 NOTE — Telephone Encounter (Signed)
Spoke with patient's daughter.  She has been in the hospital.  Her feet are swollen.  She started taking the Lasix again,  (20mg ) but her swelling with not go down. Patient is on fluid restriction due to low sodium.  Wants to know what she should do?  No Known Allergies  Current Outpatient Prescriptions on File Prior to Visit  Medication Sig Dispense Refill  . aspirin 81 MG tablet Take 81 mg by mouth every other day. Take one tablet every other day    . budesonide-formoterol (SYMBICORT) 160-4.5 MCG/ACT inhaler 2 puffs then rinse mouth, twice daily 1 Inhaler prn  . guaiFENesin (MUCINEX) 600 MG 12 hr tablet Take 1 tablet (600 mg total) by mouth 2 (two) times daily. 60 tablet 0  . Ipratropium-Albuterol (COMBIVENT) 20-100 MCG/ACT AERS respimat Inhale 1 puff into the lungs every 6 (six) hours. 3 Inhaler 3  . ipratropium-albuterol (DUONEB) 0.5-2.5 (3) MG/3ML SOLN Take 3 mLs by nebulization 2 (two) times daily. DX 496 180 mL 11  . levofloxacin (LEVAQUIN) 500 MG tablet Take 1 tablet (500 mg total) by mouth daily. 2 tablet 0  . lisinopril (PRINIVIL,ZESTRIL) 20 MG tablet Take 20 mg by mouth daily.    Marland Kitchen. LORazepam (ATIVAN) 0.5 MG tablet Take 1 tablet (0.5 mg total) by mouth every 6 (six) hours as needed for anxiety. 30 tablet 0  . Multiple Vitamins-Minerals (PRESERVISION AREDS 2 PO) Take 2 capsules by mouth daily.    . nicotine (NICODERM CQ - DOSED IN MG/24 HOURS) 14 mg/24hr patch Place 1 patch (14 mg total) onto the skin daily. 28 patch 0  . predniSONE (DELTASONE) 10 MG tablet Take 4 tablets x 2 days. Take 2 tablets x 1 day. Take 1 tablet x 1 day. 11 tablet 0  . [DISCONTINUED] amLODipine (NORVASC) 10 MG tablet Take 1 tablet (10 mg total) by mouth daily. 30 tablet 3  . [DISCONTINUED] pantoprazole (PROTONIX) 40 MG tablet Take 1 tablet (40 mg total) by mouth 2 (two) times daily before a meal. 60 tablet 3   No current facility-administered medications on file prior to visit.

## 2015-03-21 ENCOUNTER — Encounter: Payer: Self-pay | Admitting: Internal Medicine

## 2015-03-21 ENCOUNTER — Ambulatory Visit (INDEPENDENT_AMBULATORY_CARE_PROVIDER_SITE_OTHER): Payer: Medicare HMO | Admitting: Internal Medicine

## 2015-03-21 VITALS — BP 92/60 | HR 90 | Ht 61.0 in | Wt 116.0 lb

## 2015-03-21 DIAGNOSIS — J432 Centrilobular emphysema: Secondary | ICD-10-CM

## 2015-03-21 DIAGNOSIS — F17200 Nicotine dependence, unspecified, uncomplicated: Secondary | ICD-10-CM

## 2015-03-21 DIAGNOSIS — Z720 Tobacco use: Secondary | ICD-10-CM | POA: Diagnosis not present

## 2015-03-21 NOTE — Progress Notes (Signed)
Patient ID: [name], female    DOB: 1945/10/16, 69 y.o.   MRN: [id number]  HPI [age] yoF smoker followed for COPD, tobacco dependence and cough. [employer] EMR reviewed for conversion to Epic.Has had extensive efforts at smoking counseling (husband died of tobacco induced lung cancer).  Comes today with increased cough since March 2, with concerns of relation to mold abatement being done in home. Refrigerator line leaked, later floor buckled and on removal, mold found beneath flooring. Thinks she may have coughed more since recent water leak onto kitchen floor. Denies fever, sore throat or obvious infection. Denies reflux. Has been more wheezey and short of breath Using her Advair and using Combivent 3 x/ day.   06/23/11- 64 yoF smoker followed for COPD, tobacco dependence and cough. She has had additional stress recently, son just had an MI and coronary stent. She has not tried to stop smoking despite counseling. She found Symbicort to be no better than Advair. Changing from a dry powder inhaler had no effect on her cough. Using rescue inhaler a little more. New complaint: Feels tight through the neck and unable to relax her neck and shoulders. A neck brace helps.  08/14/11 - 64 yoF smoker followed for COPD, tobacco dependence and cough. Acute visit- she is still smoking against advice. Has had flu vaccine. Now complains of acute illness for the past week. Chest tight, short of breath. Cough was productive but now is dry. Denies fever, chest pain blood or palpitation. 5 days ago and she accepted a prescription from us for Z-Pak, just finished today, but she says "it never helps". She is particularly concerned about recurrence of mucus plugging, since she understands that was the problem related to a sustained exacerbation with hospital stay in the past. She is using her rescue inhaler every 4 hours. CXR- 06/26/11- stable COPD, NAD.  10/01/11- 64 yoF smoker followed for COPD, tobacco dependence  and cough.  Increased SOB with walking or at rest-gotten worse since Christmas; prior to this was sick 08-14-2011-never regrouped from this. Wakes up at night gasping for air-has had to use rescue inhaler a few times when this happens. She finished prednisone and antibiotic that we had called in December 24 and has now been off of those for about 2 weeks. Stays short of breath as described. We discussed pending change of Combivent to the new delivery device. She is anxious about any change from familiar. Using Xanax about once daily. PFT- 07/07/11- severe obstructive airways disease with response to bronchodilator, hyperinflation, diffusion moderately reduced. FEV1 0.73/38%, FEV1/FVC 0.37, DLCO 45%. Loop configuration is emphysema.  10/21/11- 64 yoF smoker followed for COPD, tobacco dependence and cough. Post hospital-  Pt states increase SOB upon activity ,wheezing,denies any cough.     Daughter Kaitlyn RombergDrenda is here Hospitalized in January 17 through 10/12/2011. She got progressively worse after last visit here and went to the emergency room. Treated for COPD exacerbation. Now she still is not quite back to her baseline but says she has not smoked since leaving the hospital. I picked on this and try to reinforce it with support. Especially short of breath with any exertion. She is wearing oxygen but once the office so she can continue to live alone and do her own yard work. October I could not, as we could get her back off. Lisinopril was changed to Norvasc and I discussed potential for lisinopril to be associated with dry cough and some people. She is tapering off of prednisone. Echocardiogram  reviewed and discussed-pulmonary artery systolic   12/03/11- 64 yoF smoker followed for COPD, tobacco dependence and cough. She  still needs oxygen sometimes during the day. We discussed oxygen delivery devices. Little cough or phlegm. Had Pneumovax.  01/25/12- 64 yoF smoker followed for COPD, tobacco dependence and  cough.  Daughter here Acute visit-SOB getting worse in the past week; Prednisone was called in last week(took last pill this am); had head congestion, ears popping last week-called in ABX/ augmentin-not working. Friday called and was told to go to ER-pt did not go. Coughing-productive-looks like slight yellow in color and thick-feels better once getting it up; having to wear O2 several times during the day.   04/12/12-  64 yoF smoker followed for COPD, tobacco dependence and cough.   States about the same. c/o difficulty breathing and using oxygen more often due to the hot weather. Also occassional cough and wheezing.  COPD assessment test (CAT) score 20/40 She has a jury summons which we discussed. She is oxygen dependent and cannot count on her portable oxygen to last the length of a jury obligation. She stays indoors to avoid the heat and humidity, getting others to mow her yard.   07/14/12- 64 yoF smoker followed for COPD, tobacco dependence and cough.   Good and bad days with breathing; no more than usual SOB. COPD assessment test (CAT) score 15/40 Has had flu shot. Weather changes are bothering her. Avoids using her oxygen in the house if she is comfortable but sleeps with it every night- 2L/[employer]. Using metered inhaler once daily. Joined "[employer]" exercise program. Mentions burning occipital pain and I suggested she look into evaluation of her cervical spine. Complains of easy bruising without blood. On baby aspirin.  09/23/12-66 yoF smoker followed for COPD, tobacco dependence and cough.  ACUTE VISIT: Increased SOB than normal; cough. (started feeling bad last week-was given Predn taper and Doxy RX) not feeling any better. Head gets "full" after coughing so much and able to small amount of mucus up. She has not felt well since December. In the last week we had called in prednisone and doxycycline which ended today without benefit. Short of breath, chest congestion, dry cough, full in  head, ears and nose. No fever now but feels weak. She only admits to smoking 2 or 3 cigarettes daily but that is still  CT chest 10/08/11 IMPRESSION:  No evidence of pulmonary embolism or other acute findings. Stable  emphysematous lung disease.  Original Report Authenticated By: Reola CalkinsGLENN T. YAMAGATA, M.D.   01/12/13- [age] yoF smoker followed for COPD, tobacco dependence and cough.  FOLLOWS FOR: feeling better since 09/2012 visit; Denies any wheezing or SOB. States allergies are flaring up-mowed the yard on Tuesday. Eyes burn and itch, blamed on pollen No real change in her chest or shortness of breath with exertion. Felt much better after Depo-Medrol last visit. Using oxygen only to sleep. Using Combivent inhaler one time daily.and Symbicort twice daily. Exercises at gym 4 days per week. Complains that calves tender and ache. [facility] did peripheral vascular evaluation, negative. Echocardiogram 10/06/2011-grade 2 diastolic dysfunction CXR 09/23/12 IMPRESSION:  COPD/chronic changes. No active cardiopulmonary disease.  Original Report Authenticated By: Charlett NoseKevin Dover, M.D.  03/20/13-  [age] yoF smoker followed for COPD, tobacco dependence and cough ACUTE VISIT: PT states she is unable to breath that she cant even get in the shower mowed yard-started having increased SOB since then-got worse over the weeked. Continues to use O2, nebulizer, and inhalers-slight increase in use.  Still smoking against repeated counseling Ok at baseline until riding mower dusty 6 days ago. Progressive SOB through week, clear mucus, scant. Needs to stay on O2 more of time. Using neb 1-3x/ day.  07/13/13- 67 yoF smoker followed for COPD, tobacco dependence and cough FOLLOWS FOR: having cough(started feeling bad on Sunday). SOB and wheezing. Increased shortness of breath with dyspnea on exertion especially in the last 4 days. Doesn't feel well. Cough interfering with sleep in recliner this week. Might have low-grade fever. Sore throat last  week. Unfortunately still smokes despite counseling. Has been going to [employer] for exercise and has usually felt comfortable off of oxygen during the daytime until this week.  01/16/14- 67 yoF smoker followed for COPD, tobacco dependence and cough FOLLOWS FOR:  Breathing doing well.  Some days better than others Complains of neck pain -Incidental.  Took left over prednisone in early March for her breathing, none since. Chronic cough and dyspnea without chest pain or discolored sputum.  03/12/14- 67 yoF smoker followed for COPD, tobacco dependence and cough ACUTE VISIT:  Increased sob, tightness in chest and some cough x1 week.  On antibiotics 6 days no relief 2 weks ago had sore throat then nose blowing, increased SOB. Took left-over prednisone. PCP gave levaquin 750 mg day 6/10, depomedrol. Has another prednisone taper to take if needed. L calf hurts with standing.  O2 2L CXR 01/16/14 IMPRESSION:  No evidence of acute cardiopulmonary disease.  Electronically Signed  By: Charline BillsSriyesh Krishnan M.D.  On: 01/16/2014 16:47  06/05/14- [age] yoF smoker followed for COPD, tobacco dependence and cough FOLLOWS FOR: Pt c/o increase in SOB, intermittent cough with some mucus production x 2days. Pt denies CP/tightness and f/c/s. Pt states she has been using her flutter valve to help with the chest congestion.  And admits to not feeling well for 2-3 weeks with increase chest tightness gradually worse. She took amoxicillin and prednisone we have given her to hold, but did not help. Breathing is not disturbing her sleep. Little sputum, nothing purulent and no fever or chest pain. Using nebulizer twice daily. Likes Symbicort. Albuterol substituted for Combivent because of insurance.  07/19/14- 68 yoF smoker followed for COPD, tobacco dependence and cough FOLLOW FOR: COPD  She still has cough and sob. She denies chest congestion. She has not been using the flutter valve. Concerned about relative cost of  albuterol inhaler and Combivent inhaler-prefers Combivent.  09/06/14- acute visit. Quick work in because she asked to come for Depo-Medrol injection. FOLLOWS FOR: Pt states she is having slight SOB with wheezing since last visit. Denies any cough. Had to take Augmentin and Pred[facility]one [facility]ayed sick all through [facility].   10/18/14- 68 yoF smoker followed for COPD, tobacco dependence and cough FOLLOWS FOR: Pt states she is having slight SOB with wheezing since last visit. Denies any cough. Had to take Augmentin and Prednisone Rx [facility]stayed sick all through [facility]. She is now "hanging in there", not acutely ill. Using oxygen 2 L/ [employer] for sleep and as needed. Asks med refills to hold.  12/12/14- 68 yoF smoker followed for COPD, tobacco dependence and cough Follow For: Increased sob over past few weeks - Denies cough , chest tightness or wheezing increased shortness of breath in the last 3 or 4 days particularly but without acute event. Using her nebulizer more. Denies fever, purulent sputum, sore throat, chest pain or palpitation. Legs swell  a little but not new. Using an incentive spirometer and Flutter. Has leftover prescriptions for amoxicillin and prednisone from January. Remains on oxygen at 2-3 L/ [employer].  02/14/15- 68 yoF smoker followed for COPD, tobacco dependence and cough Reports: x2wks has been having SOB, taking predinsone on 5.21 has one pill left.2L O2  [employer] Dyspnea is affecting her sleep. No fever or purulent sputum. Using Combivent rescue inhaler twice daily. CXR 12/12/14 IMPRESSION: Emphysema without superimposed pneumonia. Electronically Signed  By: Marnee Spring M.D.  On: 12/12/2014 10:08  03/21/15- 68 yoF smoker followed for COPD, tobacco dependence and cough Reports: x2wks has been having SOB, taking predinsone on 5.21 has one pill left. 2L O2  [employer]   daughter here Follows For: Pt  c/o fluid retention, SOB with activity. States she uses 2L daily. Slight chest congestion/tightness. Occasional prod cough with mucus.  Post Hosp 6/22-25/16 acute on chronic resp failure, COPD Gold III,hyponatremia Saw her PCP yesterday- Na up to 133. We discussed sodium/potassium/Lasix and I explained this would be best managed by her primary physician who can watch her. We reviewed chest x-ray report and images CXR 03/14/15 IMPRESSION: Stable emphysematous changes without acute abnormality. Interval clearing in the right lung base. Imaging findings of potential clinical significance: Aortic Atherosclerosis Electronically Signed  By: Alcide Clever M.D.  On: 03/14/2015 08:22  Review of Systems-see HPI Constitutional:   No-   weight loss, night sweats, fevers, chills, fatigue, lassitude. HEENT:   No-  headaches, difficulty swallowing, tooth/dental problems, sore throat,       No- sneezing, itching, no-ear ache, nasal congestion, post nasal drip,  CV:  No- chest pain, orthopnea, PND, +swelling in lower extremities, anasarca, dizziness, palpitations.  Resp: Per HPI-     +shortness of breath with exertion or at rest.                productive cough,  -productive cough,  No-  coughing up of blood.              No-  change in color of mucus.   wheezing.   Skin: No-   rash or lesions. GI:  No-   heartburn, indigestion, abdominal pain, nausea, vomiting,  GU: MS:   neck pain w/o radicular symptoms Neuro-  Psych:  No- change in mood or affect. depression or anxiety.  No memory loss.   Objective:   Physical Exam  General- Alert, Oriented, Affect+ anxious, Distress- none acute, conversational on O2,  Skin- + steroid ecchymoses on forearms Lymphadenopathy- none Head- atraumatic            Eyes- Gross vision intact, PERRLA, conjunctivae clear secretions            Ears- Hearing, canals-normal            Nose- + steady sniffing and turbinate edema, no-Septal dev,  polyps, erosion, perforation.  .             Throat- Mallampati II , mucosa clear , drainage- none, tonsils- atrophic.   Neck-  trachea midline, no stridor , thyroid nl, carotid no bruit.+ cervical kyphosis Chest - symmetrical excursion , unlabored           Heart/CV- RRR , no murmur , no gallop  , no rub, nl s1 s2                           - JVD- none , edema+1, stasis changes- none, varices- none  Lung- distant , + trace, cough+ bronchitic type , dullness-none, rub- none           Chest wall-  Abd- Br/ Gen/ Rectal- Not done, not indicated Extrem- no cyanosis edema or clubbing Neuro- grossly intact to observation

## 2015-03-21 NOTE — Patient Instructions (Signed)
[facility]  Try wearing light support hose, perhaps knee high, to keep the edema out of your legs  Please call as needed

## 2015-03-25 NOTE — Assessment & Plan Note (Signed)
Recent exacerbation now largely back to baseline. Plan-pulmonary rehabilitation

## 2015-03-25 NOTE — Assessment & Plan Note (Signed)
She is fatalistic about this and has not made any real effort to stop despite ongoing counseling.

## 2015-03-29 ENCOUNTER — Telehealth (HOSPITAL_COMMUNITY): Payer: Self-pay

## 2015-03-29 NOTE — Telephone Encounter (Signed)
Called patient regarding entrance to Pulmonary Rehab.  Patient states that they are interested in attending the program.  Scarlette CalicoFrances is going to verify insurance coverage and follow up.

## 2015-04-08 ENCOUNTER — Encounter (HOSPITAL_COMMUNITY): Payer: Self-pay

## 2015-04-08 ENCOUNTER — Encounter (HOSPITAL_COMMUNITY)
Admission: RE | Admit: 2015-04-08 | Discharge: 2015-04-08 | Disposition: A | Discharge status: Home / Self Care | Payer: Medicare HMO | Source: Ambulatory Visit | Attending: Internal Medicine | Admitting: Internal Medicine

## 2015-04-08 VITALS — BP 127/63 | HR 77

## 2015-04-08 DIAGNOSIS — J449 Chronic obstructive pulmonary disease, unspecified: Secondary | ICD-10-CM | POA: Insufficient documentation

## 2015-04-08 NOTE — Progress Notes (Signed)
Kaitlyn MoralesFrances L Halk 69 y.o. female Pulmonary Rehab Orientation Note Patient arrived today in [facility] Rehab for orientation to Pulmonary Rehab. [facility] chair. She does carry portable oxygen. Per pt, she uses oxygen continuously. Color good, skin warm and dry. Patient is oriented to time and place. Patient's medical history and medications reviewed. Heart rate is normal, breath sounds clear to auscultation, no wheezes, rales, or rhonchi. Grip strength equal, strong. Distal pulses 3+ bilateral posterior tibial pulses present. Patient reports she does take medications as prescribed. Patient states she follows a Regular.diet .The patient reports no specific efforts to gain or lose weight.. Patient's weight will be monitored closely. Demonstration and practice of PLB using pulse oximeter. Patient able to return demonstration satisfactorily. Safety and hand hygiene in the exercise area reviewed with patient. Patient voices understanding of the information reviewed. Department expectations discussed with patient and achievable goals were set. The patient is enthusiasm about attending the program and we look forward to working with this nice lady.The pa tient is scheduled for a 6 min walk test on Thursday, April 11, 2015 @ 4:00pm and to begin exercise on Tuesday, April 16, 2015 in the 1030 am class.  [id number]

## 2015-04-08 NOTE — Progress Notes (Signed)
Kaitlyn MoralesFrances L Ibarra 69 y.o. female  Initial Psychosocial Assessment  Pt psychosocial assessment reveals pt lives alone. Pt is currently retired from working in a [employer] office. Pt hobbies include playing games on her computer. Pt reports her stress level is moderate. Areas of stress/anxiety include her Health.  She has been hospitalized 3 times over the last 10 years for  COPD exacerbations.   She has been intubated 2 times in that 10 years.  Pt does not exhibit signs of depression. Offered emotional support and reassurance. Monitor and evaluate progress toward psychosocial goal(s).  Goal(s): Improved management of stress and anxiety regarding coping with COPD Improved coping skills Help patient work toward returning to meaningful activities that improve patient's QOL and are attainable with patient's lung disease   04/08/2015 2:27 PM

## 2015-04-11 ENCOUNTER — Encounter (HOSPITAL_COMMUNITY)
Admission: RE | Admit: 2015-04-11 | Discharge: 2015-04-11 | Disposition: A | Discharge status: Home / Self Care | Payer: Medicare HMO | Source: Ambulatory Visit | Attending: Internal Medicine | Admitting: Internal Medicine

## 2015-04-11 NOTE — Progress Notes (Signed)
[name] completed a Six-Minute Walk Test on 04/11/15 . [name] walked 1026 feet with 0 breaks.  The patient's lowest oxygen saturation was 89 %, highest heart rate was 114 bpm , and highest blood pressure was 168/70. The patient was on room air. Patient stated that nothing hindered their walk test.

## 2015-04-16 ENCOUNTER — Encounter (HOSPITAL_COMMUNITY)
Admission: RE | Admit: 2015-04-16 | Discharge: 2015-04-16 | Disposition: A | Discharge status: Home / Self Care | Payer: Medicare HMO | Source: Ambulatory Visit | Attending: Internal Medicine | Admitting: Internal Medicine

## 2015-04-16 DIAGNOSIS — J449 Chronic obstructive pulmonary disease, unspecified: Secondary | ICD-10-CM | POA: Diagnosis present

## 2015-04-16 NOTE — Progress Notes (Signed)
Today, [name] exercised at [employer]. [name]. Service time was from 10:30am to 12:30pm.  The patient exercised by performing aerobic, strengthening, and stretching exercises. Oxygen saturation, heart rate, blood pressure, rate of perceived exertion, and shortness of breath were all monitored before, during, and after exercise. [name] presented with no problems at today's exercise session. The patient reviewed Pursed Lip and Diaphragmatic Breathing today with [name].  The patient did not have an increase in workload intensity during today's exercise session.  Pre-exercise vitals: . Weight kg: 54.3 . Liters of O2: ra . SpO2: 97 . HR: 75 . BP: 138/68 . CBG: na  Exercise vitals: . Highest heartrate:  104 . Lowest oxygen saturation: 92 . Highest blood pressure: 154/70 . Liters of 02: ra  Post-exercise vitals: . SpO2: 97 . HR: 75 . BP: 124/62 . Liters of O2: ra . CBG: na  [name], Medical Director [name] is immediately available during today's Pulmonary Ibarra session for [name] on 04/16/15 at 10:30am class time.

## 2015-04-16 NOTE — Progress Notes (Signed)
[facility] [place] Pulmonary Rehabilitation Baseline Outcomes Assessment   Anthropometrics:  . Height (inches): 60.5 . Weight (kg): 52.7 . Grip strength was measured using a Dynamometer.  The patient's highest score was a 22.  Functional Status/Exercise Capacity: . Heiress had a resting heart rate of 77 BPM, a resting blood pressure of 127/63, and an oxygen saturation of 100 % on room air.  Nea performed a 6-minute walk test on 04/16/15.  The patient completed 1026 feet in 6 minutes with 0 rest breaks.  This quantifies 2.45 METS.   Dyspnea Measures: . [facility] is a simple and standardized method of classifying disability in patients with COPD.  The assessment correlates disability and dyspnea.  At entrance the patient scored a 1. The scale is provided below.   0= I only get breathless with strenuous exercise. 1= I get short of breath when hurrying on level ground or walking up a slight incline. 2= On level ground, I walk slower than people of the same age because of breathlessness, or have to stop for breath when walking at my own pace. 3= I stop for breath after walking 100 yards or after a few minutes on level ground. 4=I am too breathless to leave the house or I am breathless when dressing.   . The patient completed the [facility] Shortness Of Breath Questionnaire ([facility] [place]).  This questionnaire relates activities of daily living and shortness of breath.  The score ranges from 0-120, a higher score relates to severe shortness of breath during activities of daily living. The patient's score at entrance was 51.  Quality of Life: . Ferrans and Powers Quality of Life Index Pulmonary Version is used to assess the patients satisfaction in different domains of their life; health and functioning, socioeconomic, psychological/spiritual, and family. The overall score is recorded out of 30 points.  The patient's goal is to achieve an overall score of 21 or  higher.  Katrinia received a 23.09 at entrance.  . The Patient Health Questionnaire (PHQ-2) is a first step approach for the screening of depression.  If the patient scores positive on the PHQ-2 the patient should be further assessed with the PHQ-9.  The Patient Health Questionnaire (PHQ-9) assesses the degree of depression.  Depression is important to monitor and track in pulmonary patients due to its prevalence in the population.  If the patient advances to the PHQ-9 the goal is to score less than 4 on this assessment.  Shylee scored a 0 at entrance.  Clinical Assessment Tools: . The COPD Assessment Test (CAT) is a measurement tool to quantify how much of an impact the disease has on the patient's life.  This assessment aids the Pulmonary Rehab Team in designing the patients individualized treatment plan.  A CAT score ranges from 0-40.  A score of 10 or below indicates that COPD has a low impact on the patient's life whereas a score of 30 or higher indicates a severe impact. The patient's goal is a decrease of 1 point from entrance to discharge.  Amaliya had a CAT score of 18 at entrance.  Nutrition: . The "Rate My Plate" is a dietary assessment that quantifies the balance of a patient's diet.  This tool allows the Pulmonary Rehab Team to key in on the areas of the patient's diet that needs improving.  The team can then focus their nutritional education on those areas.  If the patient scores 24-40, this means there are many ways  they can make their eating habits healthier, 41-57 states that there are some ways they can make their eating habits healthier and a score of 58-72 states that they are making many healthy choices.  The patient's goal is to achieve a score of 49 or higher on this assessment.  Senie scored a 43 at entrance.  Oxygen Compliance: . Patient is currently on ra at rest, 2 liters at night, and ra for exercise.  Dakia is not currently using a cpap/bipap at night.  The patient is  currently compliant.  The patient states that they do have barriers that keep them from using their oxygen. She states that those barriers include not wanting to be seen with the oxygen. .  Education: . Temitope will attend education classes during the course of Pulmonary Rehab.  Education classes that will be offered to the patient are Activities of Daily Living and Energy Conservation, Pursed Lip Breathing and Diaphragmatic Breathing, Nutrition, Exercise for the Pulmonary Patient, Warning Signs of Infection, Chronic Lung Disease, Advanced Directives, Medications, and Stress and Meditation.  The patient completed an assessment at the entrance of the program and will complete it again upon discharge to demonstrate the level of understanding provided by the educational classes.  This assessment includes 14 questions regarding all of the education topics above.  Monic achieved a score of 12/13 at entrance.  Smoking Cessation:  The patient is not currently smoking.  Exercise: . Glory will be provided with an individualized Home Exercise Prescription (HEP) at the entrance of the program.  The patient will be followed by the Pulmonary Exercise Physiologist throughout the program to assist with the progression of the frequency, intensity, time, and type of exercise. The patient's long-term goal is to be exercising 30-60 minutes, 3-5 days per week. At entrance, the patient was exercising 0 days at home.   Marland Kitchen

## 2015-04-18 ENCOUNTER — Encounter (HOSPITAL_COMMUNITY)
Admission: RE | Admit: 2015-04-18 | Discharge: 2015-04-18 | Disposition: A | Discharge status: Home / Self Care | Payer: Medicare HMO | Source: Ambulatory Visit | Attending: Internal Medicine | Admitting: Internal Medicine

## 2015-04-18 DIAGNOSIS — J449 Chronic obstructive pulmonary disease, unspecified: Secondary | ICD-10-CM | POA: Diagnosis not present

## 2015-04-18 NOTE — Progress Notes (Signed)
Today, [name] exercised at [employer]. Service time was from 1115 to 1115.  The patient exercised by performing aerobic, strengthening, and stretching exercises. Oxygen saturation, heart rate, blood pressure, rate of perceived exertion, and shortness of breath were all monitored before, during, and after exercise. [name] presented with no problems at today's exercise session. She attended doctor day with Dr Molli Knock today.  The patient did not have an increase in workload intensity during today's exercise session.  Pre-exercise vitals: . Weight kg: 54.0 . Liters of O2: RA . SpO2: 96 . HR: 80 . BP: 118/66 . CBG: NA  Exercise vitals: . Highest heartrate:  92 . Lowest oxygen saturation: 93 . Highest blood pressure: 130/70 . Liters of 02: RA  Post-exercise vitals: . SpO2: 96 . HR: 75 . BP: 112/66 . Liters of O2: RA . CBG: NA Dr. Kalman Shan, Medical Director Dr. Gwenlyn Perking is immediately available during today's Pulmonary Rehab session for [name] on 04/18/2015  at 1115 class time.

## 2015-04-23 ENCOUNTER — Encounter (HOSPITAL_COMMUNITY)
Admission: RE | Admit: 2015-04-23 | Discharge: 2015-04-23 | Disposition: A | Discharge status: Home / Self Care | Payer: Medicare HMO | Source: Ambulatory Visit | Attending: Internal Medicine | Admitting: Internal Medicine

## 2015-04-23 DIAGNOSIS — J449 Chronic obstructive pulmonary disease, unspecified: Secondary | ICD-10-CM | POA: Insufficient documentation

## 2015-04-23 NOTE — Progress Notes (Signed)
Today, [name] exercised at [facility]. Service time was from 10:30am to 12:10pm.  The patient exercised by performing aerobic, strengthening, and stretching exercises. Oxygen saturation, heart rate, blood pressure, rate of perceived exertion, and shortness of breath were all monitored before, during, and after exercise. [name] presented with no problems at today's exercise session.  The patient did have an increase in workload intensity during today's exercise session.  Pre-exercise vitals: . Weight kg: 53.5 . Liters of O2: ra . SpO2: 96 . HR: 81 . BP: 110/56 . CBG: na  Exercise vitals: . Highest heartrate:  100 . Lowest oxygen saturation: 90 . Highest blood pressure: 120/66 . Liters of 02: ra  Post-exercise vitals: . SpO2: 97 . HR: 77 . BP: 112/64 . Liters of O2: ra . CBG: na  Dr. Kalman Shan, Medical Director Dr. Gwenlyn Perking is immediately available during today's Pulmonary Rehab session for [name] on 04/23/15 at 10:30am class time.

## 2015-04-25 ENCOUNTER — Encounter (HOSPITAL_COMMUNITY)
Admission: RE | Admit: 2015-04-25 | Discharge: 2015-04-25 | Disposition: A | Discharge status: Home / Self Care | Payer: Medicare HMO | Source: Ambulatory Visit | Attending: Internal Medicine | Admitting: Internal Medicine

## 2015-04-25 NOTE — Progress Notes (Signed)
[name] 69 y.o. female Nutrition Note Spoke with pt. Pt is is at normal weight. Wt trends reviewed. Pt wt appears to have decreased 4-6 lb over the past 6 months. Per pt, pt wt got down to 112 lb, when she was sick. Pt wt currently up 4 lb from reported lowest wt. Pt does not want to focus on wt gain at this time. Monitoring pt for slow, chronic wt loss with COPD discussed. There are some ways the pt can make her eating habits healthier. Pt's Rate Your Plate results reviewed with pt. Pt does not avoid salty food; uses canned/ convenience food. Pt does not add salt to food. The role of sodium in lung disease reviewed with pt. Pt h/o hyponatremia noted. Pt expressed understanding of the information reviewed.  No results found for: HGBA1C  Nutrition Diagnosis ? Food-and nutrition-related knowledge deficit related to lack of exposure to information as related to diagnosis of pulmonary disease ? Increased energy expenditure related to increasedI energy requirements during COPD exacerbation as evidenced by recent h/o wt loss.  Nutrition Intervention ? Pt's individual nutrition plan and goals reviewed with pt. ? Benefits of adopting healthy eating habits discussed when pt's Rate Your Plate reviewed. ? Pt to attend the Nutrition and Lung Disease class ? Continual client-centered nutrition education by RD, as part of interdisciplinary care.  Goal(s) 1. The pt will recognize symptoms that can interfere with adequate oral intake, such as shortness of breath, N/V, early satiety, fatigue, ability to secure and prepare food, taste and smell changes, chewing/swallowing difficulties, and/ or pain when eating. 2. Pt to increase vegetable intake to at least 1-2 servings/d Monitor and Evaluate progress toward nutrition goal with team.   Mickle Plumb, M.Ed, RD, LDN, CDE 04/25/2015 12:03 PM

## 2015-04-25 NOTE — Progress Notes (Addendum)
Today, Shanedra exercised at [facility]. Service time was from 1030 to 1220.  The patient exercised by performing strengthening, and stretching exercises. Oxygen saturation, heart rate, and blood pressure were all monitored before and after exercise. Kari presented with no problems at today's exercise session. Tonette also attended an education session on risk factor reductions.  The patient did not have an increase in workload intensity during today's exercise session. She did not do any aerobic exercise today. She had nutrition education with RD.  Pre-exercise vitals: . Weight kg: 53.7 . Liters of O2: ra . SpO2: 96 . HR: 80 . BP: 110/60 . CBG: na  Exercise vitals: NA  Post-exercise vitals: . SpO2: 96 . HR: 74 . BP: 104/70 . Liters of O2: ra . CBG: na  Dr. Kalman Shan, Medical Director Dr. Carmell Austria is immediately available during today's Pulmonary Rehab session for LEILAH POLIMENI on 04/25/2015 at 1030 class time.

## 2015-04-30 ENCOUNTER — Encounter (HOSPITAL_COMMUNITY)
Admission: RE | Admit: 2015-04-30 | Discharge: 2015-04-30 | Disposition: A | Discharge status: Home / Self Care | Payer: Medicare HMO | Source: Ambulatory Visit | Attending: Internal Medicine | Admitting: Internal Medicine

## 2015-04-30 DIAGNOSIS — J449 Chronic obstructive pulmonary disease, unspecified: Secondary | ICD-10-CM | POA: Diagnosis not present

## 2015-04-30 NOTE — Progress Notes (Signed)
Today, Alexiss exercised at [facility]. Service time was from 1030 to 1210.  The patient exercised by performing aerobic, strengthening, and stretching exercises. Oxygen saturation, heart rate, blood pressure, rate of perceived exertion, and shortness of breath were all monitored before, during, and after exercise. Tanith presented with no problems at today's exercise session.  The patient did  have an increase in workload intensity during today's exercise session.  Pre-exercise vitals: . Weight kg: 53.8 . Liters of O2: RA . SpO2: 96 . HR: 91 . BP: 108/60 . CBG: NA  Exercise vitals: . Highest heartrate:  104 . Lowest oxygen saturation: 88 . Highest blood pressure: 142/60 . Liters of 02: RA  Post-exercise vitals: . SpO2: 97 . HR: 80 . BP: 118/60 . Liters of O2: RA . CBG: NA Dr. Kalman Shan, Medical Director Dr. Carmell Austria is immediately available during today's Pulmonary Rehab session for TNIYAH NAKAGAWA on 04/30/2015 at 1030 class time.  Marland Kitchen

## 2015-05-02 ENCOUNTER — Encounter (HOSPITAL_COMMUNITY)
Admission: RE | Admit: 2015-05-02 | Discharge: 2015-05-02 | Disposition: A | Discharge status: Home / Self Care | Payer: Medicare HMO | Source: Ambulatory Visit | Attending: Internal Medicine | Admitting: Internal Medicine

## 2015-05-02 DIAGNOSIS — J449 Chronic obstructive pulmonary disease, unspecified: Secondary | ICD-10-CM | POA: Diagnosis not present

## 2015-05-02 NOTE — Progress Notes (Signed)
Today, [name] exercised at [facility]. Service time was from 10:30am to 12:15pm.  The patient exercised by performing aerobic, strengthening, and stretching exercises. Oxygen saturation, heart rate, blood pressure, rate of perceived exertion, and shortness of breath were all monitored before, during, and after exercise. [name] presented with no problems at today's exercise session.  The patient did not have an increase in workload intensity during today's exercise session.  Pre-exercise vitals: . Weight kg: 53.8 . Liters of O2: ra . SpO2: 96 . HR: 76 . BP: 104/56 . CBG: na  Exercise vitals: . Highest heartrate:  103 . Lowest oxygen saturation: 88 . Highest blood pressure: 112/64 . Liters of 02: ra  Post-exercise vitals: . SpO2: 92 . HR: 86 . BP: 118/62 . Liters of O2: ra . CBG: na  Dr. Kalman Shan, Medical Director Dr. Jerral Ralph is immediately available during today's Pulmonary Rehab session for [name] on 05/02/15 at 10:30am class time.

## 2015-05-07 ENCOUNTER — Encounter (HOSPITAL_COMMUNITY)
Admission: RE | Admit: 2015-05-07 | Discharge: 2015-05-07 | Disposition: A | Discharge status: Home / Self Care | Payer: Medicare HMO | Source: Ambulatory Visit | Attending: Internal Medicine | Admitting: Internal Medicine

## 2015-05-07 DIAGNOSIS — J449 Chronic obstructive pulmonary disease, unspecified: Secondary | ICD-10-CM | POA: Diagnosis not present

## 2015-05-07 NOTE — Progress Notes (Signed)
Today, Sharayah exercised at [facility]. Service time was from 1030 to 1215.  The patient exercised by performing aerobic, strengthening, and stretching exercises. Oxygen saturation, heart rate, blood pressure, rate of perceived exertion, and shortness of breath were all monitored before, during, and after exercise. Hetvi presented with no problems at today's exercise session.During exercise today on the walking track oxygen saturation dropped to 85% with pursed lip breathing saturation increased to 90%.  The patient did not have an increase in workload intensity during today's exercise session.  Pre-exercise vitals: . Weight kg: 53.8 . Liters of O2: RA . SpO2: 95 . HR: 87 . BP: 116/64 . CBG: NA  Exercise vitals: . Highest heartrate:  105 . Lowest oxygen saturation: 85 . Highest blood pressure: 122/76 . Liters of 02: RA  Post-exercise vitals: . SpO2: 95 . HR: 80 . BP: 112/58 . Liters of O2: RA . CBG: NA Dr. Kalman Shan, Medical Director Dr. Jerral Ralph is immediately available during today's Pulmonary Rehab session for LEE-ANN GAL on 05/07/2015  at 1030 class time.

## 2015-05-07 NOTE — Psychosocial Assessment (Signed)
[name] 69 y.o. female  30 day Psychosocial Note  Patient psychosocial assessment reveals no barriers to participation in Pulmonary Rehab. Patient continues to stated that her stress level is moderate, but her anxiety and stress level over her health is diminishing. She is growing more confident in dealing with her diagnosis of COPD and is learning tools to help her better manage an exacerbation. Patient does continue to exhibit positive coping skills to deal with her psychosocial concerns. Offered emotional support and reassurance. Patient does feel she is making progress toward Pulmonary Rehab goals. She has actually already met both of her goals. She nolonger requires oxygen and she is attending the Monday and Wednesday [employer] Classes at the [facility]. Patient reports her health and activity level has improved in the past 30 days as evidenced by patient's report of increased ability to tolerate exercise. She does admit that the weather and humidity plays a huge part in how well she feels on any given day. Patient states family/friends have noticed changes in her activity or mood. Patient reports positive about current and projected progression in Pulmonary Rehab. After reviewing the patient's treatment plan, the patient is making progress toward Pulmonary Rehab goals. Patient's rate of progress toward rehab goals is excellent. Plan of action to help patient continue to work towards rehab goals include increasing workloads as tolerated on equipment in order to increase her stamina and stregnth. Will continue to monitor and evaluate progress toward psychosocial goal(s).  Goal(s) in progress: Improved management of stress and anxiety regarding coping with COPD  Help patient work toward returning to meaningful activities that improve patient's QOL and are attainable with patient's lung disease

## 2015-05-09 ENCOUNTER — Encounter (HOSPITAL_COMMUNITY)
Admission: RE | Admit: 2015-05-09 | Discharge: 2015-05-09 | Disposition: A | Discharge status: Home / Self Care | Payer: Medicare HMO | Source: Ambulatory Visit | Attending: Internal Medicine | Admitting: Internal Medicine

## 2015-05-09 DIAGNOSIS — J449 Chronic obstructive pulmonary disease, unspecified: Secondary | ICD-10-CM | POA: Diagnosis not present

## 2015-05-09 NOTE — Progress Notes (Signed)
Today, Leisha exercised at [facility]. Service time was from 1030 to 1245.  The patient exercised by performing aerobic, strengthening, and stretching exercises. Oxygen saturation, heart rate, blood pressure, rate of perceived exertion, and shortness of breath were all monitored before, during, and after exercise. Scarlette Calico during exercise today oxygen saturation dropped to 86% on the track, with rest and pursed lip breathing sat improved to 92%.  The patient did not have an increase in workload intensity during today's exercise session.  Pre-exercise vitals: . Weight kg: 53.5 . Liters of O2: RA . SpO2: 94 . HR: 91 . BP: 116/60 . CBG: NA  Exercise vitals: . Highest heartrate:  102 . Lowest oxygen saturation: 86 . Highest blood pressure: 112/70 . Liters of 02: RA  Post-exercise vitals: . SpO2: 91 . HR: 90 . BP: 114/70 . Liters of O2: RA . CBG: NA Dr. Kalman Shan, Medical Director Dr. Jerral Ralph is immediately available during today's Pulmonary Rehab session for DEVORA TORTORELLA on 05/09/2015  at 1030 class time.

## 2015-05-14 ENCOUNTER — Encounter (HOSPITAL_COMMUNITY)
Admission: RE | Admit: 2015-05-14 | Discharge: 2015-05-14 | Disposition: A | Discharge status: Home / Self Care | Payer: Medicare HMO | Source: Ambulatory Visit | Attending: Internal Medicine | Admitting: Internal Medicine

## 2015-05-14 DIAGNOSIS — J449 Chronic obstructive pulmonary disease, unspecified: Secondary | ICD-10-CM | POA: Diagnosis not present

## 2015-05-14 NOTE — Progress Notes (Signed)
Today, [name]. Service time was from 10:30am to 12:15pm.  The patient exercised by performing aerobic, strengthening, and stretching exercises. Oxygen saturation, heart rate, blood pressure, rate of perceived exertion, and shortness of breath were all monitored before, during, and after exercise. Tiny presented with no problems at today's exercise session.  The patient did not have an increase in workload intensity during today's exercise session.  Pre-exercise vitals: . Weight kg: 53.6 . Liters of O2: ra . SpO2: 94 . HR: 74 . BP: 106/64 . CBG: na  Exercise vitals: . Highest heartrate:  105 . Lowest oxygen saturation: 88 . Highest blood pressure: 150/80 . Liters of 02: ra  Post-exercise vitals: . SpO2: 95 . HR: 84 . BP: 104/60 . Liters of O2: ra . CBG: na  Dr. Kalman Shan, Medical Director Dr. Thedore Mins is immediately available during today's Pulmonary Rehab session for [name] on 05/14/15 at 10:30am class time.

## 2015-05-16 ENCOUNTER — Encounter (HOSPITAL_COMMUNITY)
Admission: RE | Admit: 2015-05-16 | Discharge: 2015-05-16 | Disposition: A | Discharge status: Home / Self Care | Payer: Medicare HMO | Source: Ambulatory Visit | Attending: Internal Medicine | Admitting: Internal Medicine

## 2015-05-16 DIAGNOSIS — J449 Chronic obstructive pulmonary disease, unspecified: Secondary | ICD-10-CM | POA: Diagnosis not present

## 2015-05-16 NOTE — Progress Notes (Signed)
Today, [name] exercised at [facility]. Service time was from 10:30am to 12:40pm.  The patient exercised by performing aerobic, strengthening, and stretching exercises. Oxygen saturation, heart rate, blood pressure, rate of perceived exertion, and shortness of breath were all monitored before, during, and after exercise. [name] presented with no problems at today's exercise session. The patient attended education today with [name] on Oxygen Safety.  The patient did not have an increase in workload intensity during today's exercise session.  Pre-exercise vitals: . Weight kg: 53.5 . Liters of O2: ra . SpO2: 94 . HR: 86 . BP: 120/56 . CBG: na  Exercise vitals: . Highest heartrate:  104 . Lowest oxygen saturation: 87 . Highest blood pressure: 152/56 . Liters of 02: ra  Post-exercise vitals: . SpO2: 95 . HR: 93 . BP: 102/70 . Liters of O2: ra . CBG: na  Dr. Kalman Shan, Medical Director Dr. Thedore Mins is immediately available during today's Pulmonary Rehab session for [name] on 05/16/15 at 10:30am class time.

## 2015-05-20 ENCOUNTER — Encounter: Payer: Self-pay | Admitting: Internal Medicine

## 2015-05-20 ENCOUNTER — Ambulatory Visit (INDEPENDENT_AMBULATORY_CARE_PROVIDER_SITE_OTHER): Payer: Medicare HMO | Admitting: Internal Medicine

## 2015-05-20 VITALS — BP 112/60 | HR 91 | Ht 61.0 in | Wt 116.8 lb

## 2015-05-20 DIAGNOSIS — J441 Chronic obstructive pulmonary disease with (acute) exacerbation: Secondary | ICD-10-CM | POA: Diagnosis not present

## 2015-05-20 DIAGNOSIS — J9611 Chronic respiratory failure with hypoxia: Secondary | ICD-10-CM

## 2015-05-20 NOTE — Patient Instructions (Addendum)
Order- neb here with Brovana  Samples of Brovana vials if available to try each morning and each evening, instead of your usual Duoneb(albuterol plus ipratropium)  If the Brovana nebulizer medicine seems to work better for you, then let us know. We will see if we can get it for you through [employer].  We will send Combivent refill to [facility] when that gets entered on your pharmacy list

## 2015-05-20 NOTE — Progress Notes (Signed)
Patient ID: [name], female    DOB: 1945/10/16, 69 y.o.   MRN: [id number]  HPI 89 yoF smoker followed for COPD, tobacco dependence and cough. [employer] EMR reviewed for conversion to Epic.Has had extensive efforts at smoking counseling (husband died of tobacco induced lung cancer).  Comes today with increased cough since March 2, with concerns of relation to mold abatement being done in home. Refrigerator line leaked, later floor buckled and on removal, mold found beneath flooring. Thinks she may have coughed more since recent water leak onto kitchen floor. Denies fever, sore throat or obvious infection. Denies reflux. Has been more wheezey and short of breath Using her Advair and using Combivent 3 x/ day.   06/23/11- 69 yoF smoker followed for COPD, tobacco dependence and cough. She has had additional stress recently, son just had an MI and coronary stent. She has not tried to stop smoking despite counseling. She found Symbicort to be no better than Advair. Changing from a dry powder inhaler had no effect on her cough. Using rescue inhaler a little more. New complaint: Feels tight through the neck and unable to relax her neck and shoulders. A neck brace helps.  08/14/11 - 69 yoF smoker followed for COPD, tobacco dependence and cough. Acute visit- she is still smoking against advice. Has had flu vaccine. Now complains of acute illness for the past week. Chest tight, short of breath. Cough was productive but now is dry. Denies fever, chest pain blood or palpitation. 5 days ago and she accepted a prescription from us for Z-Pak, just finished today, but she says "it never helps". She is particularly concerned about recurrence of mucus plugging, since she understands that was the problem related to a sustained exacerbation with hospital stay in the past. She is using her rescue inhaler every 4 hours. CXR- 06/26/11- stable COPD, NAD.  10/01/11- 64 yoF smoker followed for COPD, tobacco dependence  and cough.  Increased SOB with walking or at rest-gotten worse since Christmas; prior to this was sick 08-14-2011-never regrouped from this. Wakes up at night gasping for air-has had to use rescue inhaler a few times when this happens. She finished prednisone and antibiotic that we had called in December 24 and has now been off of those for about 2 weeks. Stays short of breath as described. We discussed pending change of Combivent to the new delivery device. She is anxious about any change from familiar. Using Xanax about once daily. PFT- 07/07/11- severe obstructive airways disease with response to bronchodilator, hyperinflation, diffusion moderately reduced. FEV1 0.73/38%, FEV1/FVC 0.37, DLCO 45%. Loop configuration is emphysema.  10/21/11- 64 yoF smoker followed for COPD, tobacco dependence and cough. Post hospital-  Pt states increase SOB upon activity ,wheezing,denies any cough.     Daughter [name] is here Hospitalized in January 17 through 10/12/2011. She got progressively worse after last visit here and went to the emergency room. Treated for COPD exacerbation. Now she still is not quite back to her baseline but says she has not smoked since leaving the hospital. I picked on this and try to reinforce it with support. Especially short of breath with any exertion. She is wearing oxygen but once the office so she can continue to live alone and do her own yard work. October I could not, as we could get her back off. Lisinopril was changed to Norvasc and I discussed potential for lisinopril to be associated with dry cough and some people. She is tapering off of prednisone. Echocardiogram  reviewed and discussed-pulmonary artery systolic   12/03/11- 64 yoF smoker followed for COPD, tobacco dependence and cough. She  still needs oxygen sometimes during the day. We discussed oxygen delivery devices. Little cough or phlegm. Had Pneumovax.  01/25/12- 64 yoF smoker followed for COPD, tobacco dependence and  cough.  Daughter here Acute visit-SOB getting worse in the past week; Prednisone was called in last week(took last pill this am); had head congestion, ears popping last week-called in ABX/ augmentin-not working. Friday called and was told to go to ER-pt did not go. Coughing-productive-looks like slight yellow in color and thick-feels better once getting it up; having to wear O2 several times during the day.   04/12/12-  69 yoF smoker followed for COPD, tobacco dependence and cough.   States about the same. c/o difficulty breathing and using oxygen more often due to the hot weather. Also occassional cough and wheezing.  COPD assessment test (CAT) score 20/40 She has a jury summons which we discussed. She is oxygen dependent and cannot count on her portable oxygen to last the length of a jury obligation. She stays indoors to avoid the heat and humidity, getting others to mow her yard.   07/14/12- 64 yoF smoker followed for COPD, tobacco dependence and cough.   Good and bad days with breathing; no more than usual SOB. COPD assessment test (CAT) score 15/40 Has had flu shot. Weather changes are bothering her. Avoids using her oxygen in the house if she is comfortable but sleeps with it every night- 2L/[employer]. Using metered inhaler once daily. Joined "[employer]" exercise program. Mentions burning occipital pain and I suggested she look into evaluation of her cervical spine. Complains of easy bruising without blood. On baby aspirin.  09/23/12-69 yoF smoker followed for COPD, tobacco dependence and cough.  ACUTE VISIT: Increased SOB than normal; cough. (started feeling bad last week-was given Predn taper and Doxy RX) not feeling any better. Head gets "full" after coughing so much and able to small amount of mucus up. She has not felt well since December. In the last week we had called in prednisone and doxycycline which ended today without benefit. Short of breath, chest congestion, dry cough, full in  head, ears and nose. No fever now but feels weak. She only admits to smoking 2 or 3 cigarettes daily but that is still  CT chest 10/08/11 IMPRESSION:  No evidence of pulmonary embolism or other acute findings. Stable  emphysematous lung disease.  Original Report Authenticated By: Reola CalkinsGLENN T. YAMAGATA, M.D.   01/12/13- [age] yoF smoker followed for COPD, tobacco dependence and cough.  FOLLOWS FOR: feeling better since 09/2012 visit; Denies any wheezing or SOB. States allergies are flaring up-mowed the yard on Tuesday. Eyes burn and itch, blamed on pollen No real change in her chest or shortness of breath with exertion. Felt much better after Depo-Medrol last visit. Using oxygen only to sleep. Using Combivent inhaler one time daily.and Symbicort twice daily. Exercises at gym 4 days per week. Complains that calves tender and ache. [facility] did peripheral vascular evaluation, negative. Echocardiogram 10/06/2011-grade 2 diastolic dysfunction CXR 09/23/12 IMPRESSION:  COPD/chronic changes. No active cardiopulmonary disease.  Original Report Authenticated By: Charlett NoseKevin Dover, M.D.  03/20/13-  [age] yoF smoker followed for COPD, tobacco dependence and cough ACUTE VISIT: PT states she is unable to breath that she cant even get in the shower mowed yard-started having increased SOB since then-got worse over the weeked. Continues to use O2, nebulizer, and inhalers-slight increase in use.  Still smoking against repeated counseling Ok at baseline until riding mower dusty 6 days ago. Progressive SOB through week, clear mucus, scant. Needs to stay on O2 more of time. Using neb 1-3x/ day.  07/13/13- 67 yoF smoker followed for COPD, tobacco dependence and cough FOLLOWS FOR: having cough(started feeling bad on Sunday). SOB and wheezing. Increased shortness of breath with dyspnea on exertion especially in the last 4 days. Doesn't feel well. Cough interfering with sleep in recliner this week. Might have low-grade fever. Sore throat last  week. Unfortunately still smokes despite counseling. Has been going to [employer] for exercise and has usually felt comfortable off of oxygen during the daytime until this week.  01/16/14- 67 yoF smoker followed for COPD, tobacco dependence and cough FOLLOWS FOR:  Breathing doing well.  Some days better than others Complains of neck pain -Incidental.  Took left over prednisone in early March for her breathing, none since. Chronic cough and dyspnea without chest pain or discolored sputum.  03/12/14- 67 yoF smoker followed for COPD, tobacco dependence and cough ACUTE VISIT:  Increased sob, tightness in chest and some cough x1 week.  On antibiotics 6 days no relief 2 weks ago had sore throat then nose blowing, increased SOB. Took left-over prednisone. PCP gave levaquin 750 mg day 6/10, depomedrol. Has another prednisone taper to take if needed. L calf hurts with standing.  O2 2L CXR 01/16/14 IMPRESSION:  No evidence of acute cardiopulmonary disease.  Electronically Signed  By: Charline BillsSriyesh Krishnan M.D.  On: 01/16/2014 16:47  06/05/14- [age] yoF smoker followed for COPD, tobacco dependence and cough FOLLOWS FOR: Pt c/o increase in SOB, intermittent cough with some mucus production x 2days. Pt denies CP/tightness and f/c/s. Pt states she has been using her flutter valve to help with the chest congestion.  And admits to not feeling well for 2-3 weeks with increase chest tightness gradually worse. She took amoxicillin and prednisone we have given her to hold, but did not help. Breathing is not disturbing her sleep. Little sputum, nothing purulent and no fever or chest pain. Using nebulizer twice daily. Likes Symbicort. Albuterol substituted for Combivent because of insurance.  07/19/14- 68 yoF smoker followed for COPD, tobacco dependence and cough FOLLOW FOR: COPD  She still has cough and sob. She denies chest congestion. She has not been using the flutter valve. Concerned about relative cost of  albuterol inhaler and Combivent inhaler-prefers Combivent.  09/06/14- acute visit. Quick work in because she asked to come for Depo-Medrol injection. FOLLOWS FOR: Pt states she is having slight SOB with wheezing since last visit. Denies any cough. Had to take Augmentin and [facility] all through [facility].   10/18/14- 68 yoF smoker followed for COPD, tobacco dependence and cough FOLLOWS FOR: Pt states she is having slight SOB with wheezing since last visit. Denies any cough. Had to take Augmentin and Prednisone Rx [facility]stayed sick all through [facility]. She is now "hanging in there", not acutely ill. Using oxygen 2 L/ [employer] for sleep and as needed. Asks med refills to hold.  12/12/14- 68 yoF smoker followed for COPD, tobacco dependence and cough Follow For: Increased sob over past few weeks - Denies cough , chest tightness or wheezing increased shortness of breath in the last 3 or 4 days particularly but without acute event. Using her nebulizer more. Denies fever, purulent sputum, sore throat, chest pain or palpitation. Legs swell  a little but not new. Using an incentive spirometer and Flutter. Has leftover prescriptions for amoxicillin and prednisone from January. Remains on oxygen at 2-3 L/ [employer].  02/14/15- 68 yoF smoker followed for COPD, tobacco dependence and cough Reports: x2wks has been having SOB, taking predinsone on 5.21 has one pill left.2L O2  [employer] Dyspnea is affecting her sleep. No fever or purulent sputum. Using Combivent rescue inhaler twice daily. CXR 12/12/14 IMPRESSION: Emphysema without superimposed pneumonia. Electronically Signed  By: Marnee Spring M.D.  On: 12/12/2014 10:08  03/21/15- 68 yoF smoker followed for COPD, tobacco dependence and cough Reports: x2wks has been having SOB, taking predinsone on 5.21 has one pill left. 2L O2  [employer]   daughter here Follows For: Pt  c/o fluid retention, SOB with activity. States she uses 2L daily. Slight chest congestion/tightness. Occasional prod cough with mucus.  Post Hosp 6/22-25/16 acute on chronic resp failure, COPD Gold III,hyponatremia Saw her PCP yesterday- Na up to 133. We discussed sodium/potassium/Lasix and I explained this would be best managed by her primary physician who can watch her. We reviewed chest x-ray report and images CXR 03/14/15 IMPRESSION: Stable emphysematous changes without acute abnormality. Interval clearing in the right lung base. Imaging findings of potential clinical significance: Aortic Atherosclerosis Electronically Signed  By: Alcide Clever M.D.  On: 03/14/2015 08:22  05/20/15- 36 yoF former smoker followed for COPD/Gold D, tobacco dependence and cough Follows For: Sob worse this past week, mostly with exertion - Chest congestion - Occas prod cough (white) - Occas wheezing - Increased diff at pulm rehab for past 2 weeks Quit smoking in June but admits now she is smoking "a couple of cigarettes" in the evening outside. We discussed this She has been in Pulmonary Rehabilitation for 5 weeks now but has found the last 2 weeks physically harder. She is starting each day using her nebulizer machine. She wants to continue using Combivent as her rescue inhaler. We discussed use of longer acting bronchodilator in her nebulizer machine.  Review of Systems-see HPI Constitutional:   No-   weight loss, night sweats, fevers, chills, fatigue, lassitude. HEENT:   No-  headaches, difficulty swallowing, tooth/dental problems, sore throat,       No- sneezing, itching, no-ear ache, nasal congestion, post nasal drip,  CV:  No- chest pain, orthopnea, PND, +swelling in lower extremities, anasarca, dizziness, palpitations.  Resp: Per HPI-     +shortness of breath with exertion or at rest.                productive cough,  -productive cough,  No-  coughing up of blood.              No-  change in color of  mucus.   wheezing.   Skin: No-   rash or lesions. GI:  No-   heartburn, indigestion, abdominal pain, nausea, vomiting,  GU: MS:   neck pain w/o radicular symptoms Neuro-  Psych:  No- change in mood or affect. depression or anxiety.  No memory loss.   Objective:   Physical Exam  General- Alert, Oriented, Affect+ anxious, Distress- none acute, conversational on O2, + very frail Skin- + steroid ecchymoses on forearms Lymphadenopathy- none Head- atraumatic            Eyes- Gross vision intact, PERRLA, conjunctivae clear secretions            Ears- Hearing, canals-normal            Nose- + steady sniffing and  turbinate edema, no-Septal dev,  polyps, erosion, perforation. .             Throat- Mallampati II , mucosa clear , drainage- none, tonsils- atrophic.   Neck-  trachea midline, no stridor , thyroid nl, carotid no bruit.+ cervical kyphosis Chest - symmetrical excursion , unlabored           Heart/CV- RRR , no murmur , no gallop  , no rub, nl s1 s2                           - JVD- none , edema+1, stasis changes- none, varices- none           Lung- distant , wheeze + mild, cough+ bronchitic type , dullness-none, rub- none           Chest wall- + kyphosis Abd- Br/ Gen/ Rectal- Not done, not indicated Extrem- no cyanosis edema or clubbing Neuro- grossly intact to observation

## 2015-05-21 ENCOUNTER — Encounter (HOSPITAL_COMMUNITY)
Admission: RE | Admit: 2015-05-21 | Discharge: 2015-05-21 | Disposition: A | Discharge status: Home / Self Care | Payer: Medicare HMO | Source: Ambulatory Visit | Attending: Internal Medicine | Admitting: Internal Medicine

## 2015-05-21 DIAGNOSIS — J449 Chronic obstructive pulmonary disease, unspecified: Secondary | ICD-10-CM | POA: Diagnosis not present

## 2015-05-21 NOTE — Progress Notes (Signed)
Today, [name] exercised at [employer]. Service time was from 10:30am to 12:05pm.  The patient exercised by performing aerobic, strengthening, and stretching exercises. Oxygen saturation, heart rate, blood pressure, rate of perceived exertion, and shortness of breath were all monitored before, during, and after exercise. [name] presented with no problems at today's exercise session.  The patient did not have an increase in workload intensity during today's exercise session.  Pre-exercise vitals: . Weight kg: 53.4 . Liters of O2: ra . SpO2: 95 . HR: 90 . BP: 130/80 . CBG: na  Exercise vitals: . Highest heartrate:  114 . Lowest oxygen saturation: 87% . Highest blood pressure: 140/70 . Liters of 02: 2  Post-exercise vitals: . SpO2: 98 . HR: 100 . BP: 110/74 . Liters of O2: 2 . CBG: NA  Dr. Kalman Shan, Medical Director Dr. Thedore Mins is immediately available during today's Pulmonary Rehab session for [name] on 05/21/15 at 10:30AM class time.

## 2015-05-23 ENCOUNTER — Encounter (HOSPITAL_COMMUNITY)
Admission: RE | Admit: 2015-05-23 | Discharge: 2015-05-23 | Disposition: A | Discharge status: Home / Self Care | Payer: Medicare HMO | Source: Ambulatory Visit | Attending: Internal Medicine | Admitting: Internal Medicine

## 2015-05-23 DIAGNOSIS — J449 Chronic obstructive pulmonary disease, unspecified: Secondary | ICD-10-CM | POA: Diagnosis present

## 2015-05-23 NOTE — Progress Notes (Signed)
Today, [name] exercised at [employer]. Service time was from 10:30am to 12:15pm.  The patient exercised by performing aerobic, strengthening, and stretching exercises. Oxygen saturation, heart rate, blood pressure, rate of perceived exertion, and shortness of breath were all monitored before, during, and after exercise. [name] presented with no problems at today's exercise session. The patient attended education today with Respiratory Therapy on Pursed Lip and Diaphragmatic Breathing.  The patient did not have an increase in workload intensity during today's exercise session.  Pre-exercise vitals: . Weight kg: 53.6 . Liters of O2: ra . SpO2: 93 . HR: 89 . BP: 110/64 . CBG: na  Exercise vitals: . Highest heartrate:  100 . Lowest oxygen saturation: 97 . Highest blood pressure: 134/62 . Liters of 02: 2  Post-exercise vitals: . SpO2: 99 . HR: 78 . BP: 106/60 . Liters of O2: 2 . CBG: na  Dr. Kalman Shan, Medical Director Dr. Arbutus Leas is immediately available during today's Pulmonary Rehab session for [name] on 05/23/15 at 10:30am class time.

## 2015-05-24 NOTE — Progress Notes (Signed)
Today I met with [name] in Pulmonary Rehab to discuss her Home Exercise.  [name] and I talked at length about where and what would be most appropriate for her Home Exercise. [name] stated that she would like to walk inside her home with her oxygen.  I wanted to discuss other options with her because I feel that she is able to do more than just walking inside her home. [name] attends a class at the [employer] 2 days a week that she described to be mostly resistance training.  She states that she was not wearing her oxygen during this group class. [name] is wearing 2 liters continuous with exercise at Pulmonary Rehab.  Patient was urged to wear her oxygen whenever she is exercising at home or at the [employer]. [name] was hesitant at the suggestion of walking around the basketball court at the [employer], utilizing the equipment at the [employer],  walking in her neighborhood, or walking at the grocery store. Part of this hesitation comes from not being comfortable wearing her oxygen in public. Patient was urged to start bringing her pulse oximeter with her when she goes to the [employer], grocery store, etc. Patient educated on what a proper oxygen saturation.  Patient was asked to continue to think about where she would be most comfortable walking for exercise while using her oxygen.  I will meet with her again next week to discuss the rest of her home exercise routine.  

## 2015-05-28 ENCOUNTER — Encounter (HOSPITAL_COMMUNITY)
Admission: RE | Admit: 2015-05-28 | Discharge: 2015-05-28 | Disposition: A | Discharge status: Home / Self Care | Payer: Medicare HMO | Source: Ambulatory Visit | Attending: Internal Medicine | Admitting: Internal Medicine

## 2015-05-28 DIAGNOSIS — J449 Chronic obstructive pulmonary disease, unspecified: Secondary | ICD-10-CM | POA: Diagnosis not present

## 2015-05-28 NOTE — Progress Notes (Signed)
Today, [name] exercised at [employer]. Service time was from 1030 to 1220.  The patient exercised by performing aerobic, strengthening, and stretching exercises. Oxygen saturation, heart rate, blood pressure, rate of perceived exertion, and shortness of breath were all monitored before, during, and after exercise. [name] presented with no problems at today's exercise session.Before exercise BP 96/60 given Gatorade recheck BP 112/68. After exercise HR 100 placed pt's legs up in chair HR decreased to 87.  The patient did  have an increase in workload intensity during today's exercise session.  Pre-exercise vitals: . Weight kg: 53.5 . Liters of O2: 2 . SpO2: 100 . HR: 76 . BP: 96/60 . CBG: NA  Exercise vitals: . Highest heartrate:  105 . Lowest oxygen saturation: 96 . Highest blood pressure: 120/60 . Liters of 02: RA  Post-exercise vitals: . SpO2: 99 . HR: 100 . BP: 122/60 . Liters of O2: 2 . CBG: NA Dr. Kalman Shan, Medical Director Dr. Arbutus Leas is immediately available during today's Pulmonary Rehab session for [name] on 05/28/2015  at 1030 class time.  Marland Kitchen

## 2015-05-30 ENCOUNTER — Encounter (HOSPITAL_COMMUNITY)
Admission: RE | Admit: 2015-05-30 | Discharge: 2015-05-30 | Disposition: A | Discharge status: Home / Self Care | Payer: Medicare HMO | Source: Ambulatory Visit | Attending: Internal Medicine | Admitting: Internal Medicine

## 2015-05-30 DIAGNOSIS — J449 Chronic obstructive pulmonary disease, unspecified: Secondary | ICD-10-CM | POA: Diagnosis not present

## 2015-05-30 NOTE — Psychosocial Assessment (Signed)
[name] 69 y.o. female  60 day Psychosocial Note  Patient psychosocial assessment reveals no barriers to participation in Pulmonary Rehab. [name] has not missed an exercise/education session in the last 30 days. She does express concern over her health. She also is in the process of quitting smoking and is down to 1 cigarette daily.  Patient does continue to exhibit somewhat positive coping skills to deal with her psychosocial concerns. She has been given tools to quit smoking and emotional support/encouragement is offered at each exercise session. She is aware that if she could quit smoking it may improve her health and tolerance to exercise significantly. Patient does not feel she is making progress toward some of her Pulmonary Rehab goals. She states she felt she was making great progress the first 30 days but she is unsure "what has happened". She attempted to attend [employer] on days she did not attend pulmonary rehab but states she just does not have the energy to exercise. We attempted to ween her off her oxygen and she was able to keep her oxygen saturations >88 for several exercise sessions, but then she developed slight chest/nasal congestion with cough and desaturated to 85 on several occasions. She has now been placed back on 2 liters of oxygen for exercise. She is also encouraged to purse lip breath. [name] showed some discouragement with being placed back on oxygen as she was hoping this program would help her saturations improve significantly.  Patient reports her health and activity level has not improved in the past 30 days as evidenced by patient's report of not significantly changed ability to "feel better during exercise". She also states her shortness of breath has not improved. Her nebulizer medication has been changed over the last two weeks which I believe may be the source of her insomnia. She states she uses her nebulizer just prior to going to bed yet she remains  restless most of the night. She has been encouraged to speak with her MD about correct time to uses her medication. Patient states family/friends have not noticed changes in her activity or mood. Patient reports not feeling positive about current and projected progression in Pulmonary Rehab. After reviewing the patient's treatment plan, the patient is not making progress toward Pulmonary Rehab goals. Patient's rate of progress toward rehab goals is poor. Plan of action to help patient continue to work towards rehab goals include increasing workloads as tolerated on equipment in order to increase stamina and stregnth. Will continue to monitor and evaluate progress toward psychosocial goal(s).  Goal(s) in progress: Improved management of stress/worry about disease progression   Improved coping skills to deal with disease process  Help patient work toward returning to meaningful activities that improve patient's QOL and are attainable with patient's lung disease

## 2015-05-30 NOTE — Progress Notes (Signed)
Today, Ronna exercised at [employer]. Service time was from 1030 to 1235.  The patient exercised by performing aerobic, strengthening, and stretching exercises. Oxygen saturation, heart rate, blood pressure, rate of perceived exertion, and shortness of breath were all monitored before, during, and after exercise. Taela presented with no problems at today's exercise session. Patient attended the [employer] class today.  The patient did not have an increase in workload intensity during today's exercise session.  Pre-exercise vitals: . Weight kg: 54.0 . Liters of O2: 2 . SpO2: 98 . HR: 89 . BP: 124/60 . CBG: na  Exercise vitals: . Highest heartrate:  111 . Lowest oxygen saturation: 95 . Highest blood pressure: 152/70 . Liters of 02: 2  Post-exercise vitals: . SpO2: 98 . HR: 96 . BP: 118/58 . Liters of O2: 2 . CBG: na Dr. Kalman Shan, Medical Director Dr. Thedore Mins is immediately available during today's Pulmonary Rehab session for VERMA GROTHAUS on 05/30/2015  at 1030 class time.  Marland Kitchen

## 2015-06-02 ENCOUNTER — Encounter: Payer: Self-pay | Admitting: Internal Medicine

## 2015-06-02 MED ORDER — IPRATROPIUM-ALBUTEROL 20-100 MCG/ACT IN AERS: 1.0000 | INHALATION_SPRAY | Freq: Four times a day (QID) | RESPIRATORY_TRACT | Status: DC

## 2015-06-02 NOTE — Assessment & Plan Note (Signed)
She remains dependent on oxygen almost all the time now

## 2015-06-02 NOTE — Assessment & Plan Note (Addendum)
She is smoking again and this may be part of why she just doesn't feel her breathing is quite as good in the last few weeks. No obvious infection. Plan-try Brovana nebulizer solution with discussion, okay to continue his Combivent which she feels works better than other inhalers for her

## 2015-06-04 ENCOUNTER — Telehealth: Payer: Self-pay | Admitting: Internal Medicine

## 2015-06-04 ENCOUNTER — Encounter (HOSPITAL_COMMUNITY)
Admission: RE | Admit: 2015-06-04 | Discharge: 2015-06-04 | Disposition: A | Discharge status: Home / Self Care | Payer: Medicare HMO | Source: Ambulatory Visit | Attending: Internal Medicine | Admitting: Internal Medicine

## 2015-06-04 DIAGNOSIS — J449 Chronic obstructive pulmonary disease, unspecified: Secondary | ICD-10-CM | POA: Diagnosis not present

## 2015-06-04 MED ORDER — BUDESONIDE-FORMOTEROL FUMARATE 160-4.5 MCG/ACT IN AERO: INHALATION_SPRAY | RESPIRATORY_TRACT | Status: DC

## 2015-06-04 MED ORDER — IPRATROPIUM-ALBUTEROL 20-100 MCG/ACT IN AERS
1.0000 | INHALATION_SPRAY | Freq: Four times a day (QID) | RESPIRATORY_TRACT | Status: DC
Start: 2015-06-04 — End: 2015-06-10

## 2015-06-04 NOTE — Progress Notes (Signed)
Today, [name] exercised at [facility]. Service time was from 10:30am to 12:15pm.  The patient exercised by performing aerobic, strengthening, and stretching exercises. Oxygen saturation, heart rate, blood pressure, rate of perceived exertion, and shortness of breath were all monitored before, during, and after exercise. [name] presented with no problems at today's exercise session.  The patient did not have an increase in workload intensity during today's exercise session.  Pre-exercise vitals: . Weight kg: 54.3 . Liters of O2: 2 . SpO2: 100 . HR: 88 . BP: 118/60 . CBG: na  Exercise vitals: . Highest heartrate:  114 . Lowest oxygen saturation: 98 . Highest blood pressure: 140/70 . Liters of 02: 2  Post-exercise vitals: . SpO2: 99 . HR: 95 . BP: 108/64 . Liters of O2: 2 . CBG: na  Dr. Kalman Shan, Medical Director Dr. Thedore Mins is immediately available during today's Pulmonary Rehab session for [name] on 06/04/15 at 10:30am class time.

## 2015-06-04 NOTE — Telephone Encounter (Signed)
Rx was [facility]ed to [facility]; confirmed with Mindy that nothing else was needed after fax went through.

## 2015-06-04 NOTE — Telephone Encounter (Signed)
Per 05/20/15:  Patient Instructions     Order- neb here with Brovana Samples of Brovana vials if available to try each morning and each evening, instead of your usual Duoneb(albuterol plus ipratropium) If the Brovana nebulizer medicine seems to work better for you, then let us know. We will see if we can get it for you through [facility]. We will send Combivent refill to [facility] when that gets entered on your pharmacy list  --  Called spoke with pt. [facility] as it was sent there. I have done so. Pt also needs her symbicort faxed to [facility].  I have printed off RX and placed on CD[facility] cart for signature. Needs to be faxed with cover sheet attached to RX. Will forward to [place] to f/u on

## 2015-06-06 ENCOUNTER — Telehealth: Payer: Self-pay | Admitting: Internal Medicine

## 2015-06-06 ENCOUNTER — Encounter (HOSPITAL_COMMUNITY)
Admission: RE | Admit: 2015-06-06 | Discharge: 2015-06-06 | Disposition: A | Discharge status: Home / Self Care | Payer: Medicare HMO | Source: Ambulatory Visit | Attending: Internal Medicine | Admitting: Internal Medicine

## 2015-06-06 DIAGNOSIS — J449 Chronic obstructive pulmonary disease, unspecified: Secondary | ICD-10-CM | POA: Diagnosis not present

## 2015-06-06 NOTE — Progress Notes (Signed)
Today, [name] exercised at [facility]. Service time was from 10:30am to 12:30pm.  The patient exercised by performing aerobic, strengthening, and stretching exercises. Oxygen saturation, heart rate, blood pressure, rate of perceived exertion, and shortness of breath were all monitored before, during, and after exercise. [name] presented with no problems at today's exercise session.The patient attended education class today with Kirt Boys Lajuane Leatham Registered Clinical Exercise Physiologist on Exercising at Home.  The patient did not have an increase in workload intensity during today's exercise session.  Pre-exercise vitals: . Weight kg: 54.3 . Liters of O2: 2 . SpO2: 98 . HR: 88 . BP: 122/60 . CBG: na  Exercise vitals: . Highest heartrate:  113 . Lowest oxygen saturation: 95 . Highest blood pressure: 156/70 . Liters of 02: 2  Post-exercise vitals: . SpO2: 98 . HR: 97 . BP: 114/60 . Liters of O2: 2 . CBG: na  Dr. Kalman Shan, Medical Director Dr. Arbutus Leas is immediately available during today's Pulmonary Rehab session for [name] on 06/06/15 at 10:30am class time.

## 2015-06-06 NOTE — Telephone Encounter (Signed)
Attempted to call pt Bad connection.  Will try to call back later

## 2015-06-07 NOTE — Telephone Encounter (Signed)
Pt returning call [phone].[name]

## 2015-06-07 NOTE — Telephone Encounter (Signed)
Attempted to call pt. I too also had a bad connection. Will try to call back.

## 2015-06-10 MED ORDER — BUDESONIDE-FORMOTEROL FUMARATE 160-4.5 MCG/ACT IN AERO: INHALATION_SPRAY | RESPIRATORY_TRACT | Status: DC

## 2015-06-10 MED ORDER — IPRATROPIUM-ALBUTEROL 20-100 MCG/ACT IN AERS: 1.0000 | INHALATION_SPRAY | Freq: Four times a day (QID) | RESPIRATORY_TRACT | Status: DC

## 2015-06-10 NOTE — Telephone Encounter (Signed)
Spoke with pt, Symbicort refill needs to be sent to [facility] [phone] Combivent needs to be faxed to [facility]-needs to be printed and faxed to  [phone]  Both rx's have been printed and placed on CY's cart for signature.  Will await signature

## 2015-06-10 NOTE — Telephone Encounter (Signed)
Both rxs have been returned and faxed to appropriate pharmacies.  Nothing further needed.

## 2015-06-11 ENCOUNTER — Encounter (HOSPITAL_COMMUNITY)
Admission: RE | Admit: 2015-06-11 | Discharge: 2015-06-11 | Disposition: A | Discharge status: Home / Self Care | Payer: Medicare HMO | Source: Ambulatory Visit | Attending: Internal Medicine | Admitting: Internal Medicine

## 2015-06-11 DIAGNOSIS — J449 Chronic obstructive pulmonary disease, unspecified: Secondary | ICD-10-CM | POA: Diagnosis not present

## 2015-06-11 NOTE — Progress Notes (Signed)
I have reviewed a Home Exercise Prescription with [name] . Mahaila is not currently exercising at home.  The patient was advised to walk 2-3 days a week for 30 minutes.  Scarlette Calico and I discussed how to progress their exercise prescription.  The patient stated that their goals were to be able to get back to the [facility] with her friends.  The patient stated that they understand the exercise prescription.  We reviewed exercise guidelines, target heart rate during exercise, oxygen use, weather, home pulse oximeter, endpoints for exercise, and goals.  Patient is encouraged to come to me with any questions. I will continue to follow up with the patient to assist them with progression and safety.

## 2015-06-11 NOTE — Progress Notes (Signed)
Today, Chariah exercised at [facility]. Service time was from 1030 to 1210.  The patient exercised by performing aerobic, strengthening, and stretching exercises. Oxygen saturation, heart rate, blood pressure, rate of perceived exertion, and shortness of breath were all monitored before, during, and after exercise. Mickelle presented with no problems at today's exercise session.  The patient did not have an increase in workload intensity during today's exercise session.  Pre-exercise vitals: . Weight kg: 54.4 . Liters of O2: 2 . SpO2: 100 . HR: 94 . BP: 140/74 . CBG: na  Exercise vitals: . Highest heartrate:  98 . Lowest oxygen saturation: 92 . Highest blood pressure: 142/76 . Liters of 02: 2  Post-exercise vitals: . SpO2: 100 . HR: 88 . BP: 102/60 . Liters of O2: 2 . CBG: na Dr. Kalman Shan, Medical Director Dr. Arbutus Leas is immediately available during today's Pulmonary Rehab session for NAYANA LENIG on 06/11/2015  at 1030 class time.  Marland Kitchen

## 2015-06-13 ENCOUNTER — Encounter (HOSPITAL_COMMUNITY)
Admission: RE | Admit: 2015-06-13 | Discharge: 2015-06-13 | Disposition: A | Discharge status: Home / Self Care | Payer: Medicare HMO | Source: Ambulatory Visit | Attending: Internal Medicine | Admitting: Internal Medicine

## 2015-06-13 DIAGNOSIS — J449 Chronic obstructive pulmonary disease, unspecified: Secondary | ICD-10-CM | POA: Diagnosis not present

## 2015-06-13 NOTE — Progress Notes (Signed)
Today, [name] exercised at [facility]. [name]. Service time was from 1030 to 1230.  The patient exercised by performing aerobic, strengthening, and stretching exercises. Oxygen saturation, heart rate, blood pressure, rate of perceived exertion, and shortness of breath were all monitored before, during, and after exercise. [name] presented with no problems at today's exercise session. [name] also attended an education session on warning signs and symptoms.  The patient did not have an increase in workload intensity during today's exercise session.  Pre-exercise vitals: . Weight kg: 78.2 . Liters of O2: 2L . SpO2: 96 . HR: 75 . BP: 104/60 . CBG: na  Exercise vitals: . Highest heartrate:  113 . Lowest oxygen saturation: 88 . Highest blood pressure: 120/70 . Liters of 02: 4L  Post-exercise vitals: . SpO2: 92 . HR: 79 . BP: 108/60 . Liters of O2: 2L . CBG: na  Dr. Kalman Shan, Medical Director Dr. Catha Gosselin is immediately available during today's Pulmonary Ibarra session for [name] on 06/13/2015 at 1030 class time.

## 2015-06-13 NOTE — Progress Notes (Signed)
Today, Jolin exercised at [facility]. Service time was from 10:30am to 12:30p.  The patient exercised by performing aerobic, strengthening, and stretching exercises. Oxygen saturation, heart rate, blood pressure, rate of perceived exertion, and shortness of breath were all monitored before, during, and after exercise. Odelia presented with no problems at today's exercise session. The patient attended education today with RN Haskel Khan on Signs and Symptoms.  The patient did not have an increase in workload intensity during today's exercise session.  Pre-exercise vitals: . Weight kg: 54.3 . Liters of O2: 2 . SpO2: 100 . HR: 76 . BP: 110/64 . CBG: na  Exercise vitals: . Highest heartrate:  116 . Lowest oxygen saturation: 97 . Highest blood pressure: 148/76 . Liters of 02: 2  Post-exercise vitals: . SpO2: 99 . HR: 89 . BP: 124/64 . Liters of O2: 2 . CBG: na  Dr. Kalman Shan, Medical Director Dr. Catha Gosselin is immediately available during today's Pulmonary Rehab session for [name] on 06/13/15 at 10:30am class time.

## 2015-06-18 ENCOUNTER — Encounter (HOSPITAL_COMMUNITY)
Admission: RE | Admit: 2015-06-18 | Discharge: 2015-06-18 | Disposition: A | Discharge status: Home / Self Care | Payer: Medicare HMO | Source: Ambulatory Visit | Attending: Internal Medicine | Admitting: Internal Medicine

## 2015-06-18 DIAGNOSIS — J449 Chronic obstructive pulmonary disease, unspecified: Secondary | ICD-10-CM | POA: Diagnosis not present

## 2015-06-18 NOTE — Progress Notes (Signed)
Today, Ferrin exercised at [facility]. Service time was from 1030 to 1215.  The patient exercised by performing aerobic, strengthening, and stretching exercises. Oxygen saturation, heart rate, blood pressure, rate of perceived exertion, and shortness of breath were all monitored before, during, and after exercise. Haelie presented with no problems at today's exercise session.  The patient did not have an increase in workload intensity during today's exercise session.  Pre-exercise vitals: . Weight kg: 54.4 . Liters of O2: RA . SpO2: 96 . HR: 77 . BP: 114/60 . CBG: NA  Exercise vitals: . Highest heartrate: 109 . Lowest oxygen saturation: 95 . Highest blood pressure: 114/60 . Liters of 02: 2  Post-exercise vitals: . SpO2: 97 . HR: 86 . BP: 104/62 . Liters of O2: 2 . CBG: NA Dr. Kalman Shan, Medical Director Dr. Catha Gosselin is immediately available during today's Pulmonary Rehab session for SHARAINE DELANGE on 06/18/2015  at 1030 class time.  Marland Kitchen

## 2015-06-18 NOTE — Psychosocial Assessment (Signed)
[name] 69 y.o. female  6 day Psychosocial Note  Patient psychosocial assessment reveals no barriers to participation in Pulmonary Rehab. She has not missed an exercise/education session since the last psychosocial assessment. She continues to express concern over her health. She continues to work on smoking cessation. She is still smoking 1 cigarette several days a week, but this is an improvement from daily smoking. Patient does continue to exhibit positive coping skills to deal with any psychosocial concerns that may arise. Offered emotional support and reassurance. Patient does feel she is making progress toward Pulmonary Rehab goals again. She "worked out" with friend at [employer] yesterday by walking on the treadmill for 40 minutes. She wore her portable oxygen as instructed and said she really enjoyed the exercise and spending time with her friends. Patient reports her health and activity level has improved in the past 30 days as evidenced by patient's report of increased ability to exercise. Patient states family/friends have noticed changes in her activity or mood. Patient reports feeling positive about current and projected progression in Pulmonary Rehab. After reviewing the patient's treatment plan, the patient is making progress toward Pulmonary Rehab goals. Patient's rate of progress toward rehab goals is good. Plan of action to help patient continue to work towards rehab goals include increasing workloads as tolerated on equipment in order to increase stamina and stregnth. Will continue to monitor and evaluate progress toward psychosocial goal(s).  Goal(s) in progress:  Continued improvement in management of stress about disease progression  Improved coping skills to deal with disease process  Help patient work toward returning to meaningful activities that improve patient's QOL and are attainable with patient's lung disease

## 2015-06-20 ENCOUNTER — Telehealth: Payer: Self-pay | Admitting: Internal Medicine

## 2015-06-20 ENCOUNTER — Encounter (HOSPITAL_COMMUNITY)
Admission: RE | Admit: 2015-06-20 | Discharge: 2015-06-20 | Disposition: A | Discharge status: Home / Self Care | Payer: Medicare HMO | Source: Ambulatory Visit | Attending: Internal Medicine | Admitting: Internal Medicine

## 2015-06-20 DIAGNOSIS — J449 Chronic obstructive pulmonary disease, unspecified: Secondary | ICD-10-CM | POA: Diagnosis not present

## 2015-06-20 MED ORDER — IPRATROPIUM-ALBUTEROL 20-100 MCG/ACT IN AERS: 1.0000 | INHALATION_SPRAY | Freq: Four times a day (QID) | RESPIRATORY_TRACT | Status: DC

## 2015-06-20 NOTE — Telephone Encounter (Signed)
Called and spoke with pt Pt [facility] and [facility] had not received rx for combivent Informed pt that office sent it on 06/07/15 Pt said again that it was not received and requested that instead of sending it electronically [facility] [phone] Pt stated that it needed a cover sheet with her name, DOB, and address   RX printed and given to [facility] for Dr Maple Hudson to sign today Will be faxed once signed  Nothing further is needed at this time

## 2015-06-20 NOTE — Progress Notes (Signed)
Today, [name] exercised at [facility]. Service time was from 1030 to 1240.  The patient exercised by performing aerobic, strengthening, and stretching exercises. Oxygen saturation, heart rate, blood pressure, rate of perceived exertion, and shortness of breath were all monitored before, during, and after exercise. [name] presented with no problems at today's exercise session.She attended Anatomy and Physiology class today.  The patient did not have an increase in workload intensity during today's exercise session.  Pre-exercise vitals: . Weight kg: 54.4 . Liters of O2: RA . SpO2: 100 . HR: 80 . BP: 108/60 . CBG: NA  Exercise vitals: . Highest heartrate:  104 . Lowest oxygen saturation: 98 . Highest blood pressure: 142/90 . Liters of 02: 2  Post-exercise vitals: . SpO2: 97 . HR: 91 . BP: 126/70 . Liters of O2: 2 . CBG: NA Dr. Kalman Shan, Medical Director Dr. Arbutus Leas is immediately available during today's Pulmonary Rehab session for [name] on 06/20/2015  at 1030 class time.  Marland Kitchen

## 2015-06-24 ENCOUNTER — Other Ambulatory Visit: Payer: Self-pay | Admitting: Internal Medicine

## 2015-06-24 NOTE — Telephone Encounter (Signed)
Ok to refill 

## 2015-06-24 NOTE — Telephone Encounter (Signed)
Refill request for Alprazolam Last refilled: 05/04/15 Last OV: 05/20/15 Next OV: 08/20/15  Ok to refill?  No Known Allergies Current Outpatient Prescriptions on File Prior to Visit  Medication Sig Dispense Refill  . ALPRAZolam (XANAX) 0.5 MG tablet Take one tablet every 6 hrs as needed for anxiety or sleep    . aspirin 81 MG tablet Take 81 mg by mouth every other day. Take one tablet every other day    . budesonide-formoterol (SYMBICORT) 160-4.5 MCG/ACT inhaler 2 puffs then rinse mouth, twice daily 3 Inhaler 3  . guaiFENesin (MUCINEX) 600 MG 12 hr tablet Take 1 tablet (600 mg total) by mouth 2 (two) times daily. 60 tablet 0  . Ipratropium-Albuterol (COMBIVENT) 20-100 MCG/ACT AERS respimat Inhale 1 puff into the lungs every 6 (six) hours. 3 Inhaler 3  . ipratropium-albuterol (DUONEB) 0.5-2.5 (3) MG/3ML SOLN USE ONE VIAL VIA NEBULIZER 2 TIMES DAILY 360 mL 5  . lisinopril (PRINIVIL,ZESTRIL) 20 MG tablet Take 20 mg by mouth daily.    . Multiple Vitamins-Minerals (PRESERVISION AREDS 2 PO) Take 2 capsules by mouth daily.    . nicotine (NICODERM CQ - DOSED IN MG/24 HOURS) 14 mg/24hr patch Place 1 patch (14 mg total) onto the skin daily. (Patient not taking: Reported on 03/21/2015) 28 patch 0  . [DISCONTINUED] amLODipine (NORVASC) 10 MG tablet Take 1 tablet (10 mg total) by mouth daily. 30 tablet 3  . [DISCONTINUED] pantoprazole (PROTONIX) 40 MG tablet Take 1 tablet (40 mg total) by mouth 2 (two) times daily before a meal. 60 tablet 3   No current facility-administered medications on file prior to visit.

## 2015-06-25 ENCOUNTER — Encounter (HOSPITAL_COMMUNITY)
Admission: RE | Admit: 2015-06-25 | Discharge: 2015-06-25 | Disposition: A | Discharge status: Home / Self Care | Payer: Medicare HMO | Source: Ambulatory Visit | Attending: Internal Medicine | Admitting: Internal Medicine

## 2015-06-25 DIAGNOSIS — J449 Chronic obstructive pulmonary disease, unspecified: Secondary | ICD-10-CM | POA: Diagnosis not present

## 2015-06-25 NOTE — Progress Notes (Signed)
Today, [name] exercised at [facility]. Service time was from 10:30a to 12:20pm.  The patient exercised by performing aerobic, strengthening, and stretching exercises. Oxygen saturation, heart rate, blood pressure, rate of perceived exertion, and shortness of breath were all monitored before, during, and after exercise. [name] presented with no problems at today's exercise session.  The patient did not have an increase in workload intensity during today's exercise session.  Pre-exercise vitals: . Weight kg: 54.9 . Liters of O2: 2 . SpO2: 99  . HR: 76 . BP: 132/64 . CBG: na  Exercise vitals: . Highest heartrate:  104 . Lowest oxygen saturation: 96 . Highest blood pressure: 152/68 . Liters of 02: 2  Post-exercise vitals: . SpO2: 99 . HR: 86 . BP: 124/78 . Liters of O2: 2 . CBG: na  Dr. Kalman Shan, Medical Director Dr. Susie Cassette is immediately available during today's Pulmonary Rehab session for [name] on 06/24/15 at 10:30am class time

## 2015-06-27 ENCOUNTER — Encounter (HOSPITAL_COMMUNITY)
Admission: RE | Admit: 2015-06-27 | Discharge: 2015-06-27 | Disposition: A | Discharge status: Home / Self Care | Payer: Medicare HMO | Source: Ambulatory Visit | Attending: Internal Medicine | Admitting: Internal Medicine

## 2015-06-27 DIAGNOSIS — J449 Chronic obstructive pulmonary disease, unspecified: Secondary | ICD-10-CM | POA: Diagnosis not present

## 2015-06-27 NOTE — Progress Notes (Signed)
Today, Matilynn exercised at [facility]. Service time was from 10:30am to 12:30pm.  The patient exercised by performing aerobic, strengthening, and stretching exercises. Oxygen saturation, heart rate, blood pressure, rate of perceived exertion, and shortness of breath were all monitored before, during, and after exercise. Maham presented with no problems at today's exercise session.  The patient attended education today with Mickle Plumb on Nutrition for the Pulmonary Patient.  The patient did not have an increase in workload intensity during today's exercise session.  Pre-exercise vitals: . Weight kg: 54.7 . Liters of O2: 2 . SpO2: 98 . HR: 90 . BP: 126/72 . CBG: na  Exercise vitals: . Highest heartrate:  103 . Lowest oxygen saturation: 97 . Highest blood pressure: 150/84 . Liters of 02: 2  Post-exercise vitals: . SpO2: 100 . HR: 93 . BP: 100/56 . Liters of O2: 2 . CBG: na  Dr. Kalman Shan, Medical Director Dr. Susie Cassette is immediately available during today's Pulmonary Rehab session for EMMARIE SANNES on 06/27/15 at 10:30am class time

## 2015-07-02 ENCOUNTER — Encounter (HOSPITAL_COMMUNITY)
Admission: RE | Admit: 2015-07-02 | Discharge: 2015-07-02 | Disposition: A | Discharge status: Home / Self Care | Payer: Medicare HMO | Source: Ambulatory Visit | Attending: Internal Medicine | Admitting: Internal Medicine

## 2015-07-02 DIAGNOSIS — J449 Chronic obstructive pulmonary disease, unspecified: Secondary | ICD-10-CM | POA: Diagnosis not present

## 2015-07-02 NOTE — Progress Notes (Signed)
Today, [name] exercised at [facility]. Service time was from 10:30am to 12:00pm.  The patient exercised by performing aerobic, strengthening, and stretching exercises. Oxygen saturation, heart rate, blood pressure, rate of perceived exertion, and shortness of breath were all monitored before, during, and after exercise. [name] presented with no problems at today's exercise session.  The patient did not have an increase in workload intensity during today's exercise session.  Pre-exercise vitals: . Weight kg: 54.4 . Liters of O2: ra . SpO2: 98 . HR: 82 . BP: 126/64 . CBG: na  Exercise vitals: . Highest heartrate:  106 . Lowest oxygen saturation: 99 . Highest blood pressure: 162/70 . Liters of 02: 2  Post-exercise vitals: . SpO2: 99 . HR: 87 . BP: 116/68 . Liters of O2: 2 . CBG: na  Dr. Kalman Shan, Medical Director Dr. Konrad Dolores is immediately available during today's Pulmonary Rehab session for [name] on 07/02/15 at 10:30am class time

## 2015-07-04 ENCOUNTER — Encounter (HOSPITAL_COMMUNITY)
Admission: RE | Admit: 2015-07-04 | Discharge: 2015-07-04 | Disposition: A | Discharge status: Home / Self Care | Payer: Medicare HMO | Source: Ambulatory Visit | Attending: Internal Medicine | Admitting: Internal Medicine

## 2015-07-04 DIAGNOSIS — J449 Chronic obstructive pulmonary disease, unspecified: Secondary | ICD-10-CM | POA: Diagnosis not present

## 2015-07-04 NOTE — Progress Notes (Signed)
[name] completed a Six-Minute Walk Test on 07/04/15 . [name] walked 1166 feet with 0 breaks.  The patient's lowest oxygen saturation was 88 %, highest heart rate was 110 bpm , and highest blood pressure was 174/70. The patient was on 2 liters of oxygen with a nasal cannula. Patient stated that nothing hindered their walk test.

## 2015-07-09 ENCOUNTER — Encounter (HOSPITAL_COMMUNITY): Payer: Medicare HMO

## 2015-07-11 ENCOUNTER — Encounter (HOSPITAL_COMMUNITY): Payer: Medicare HMO

## 2015-07-11 NOTE — Progress Notes (Signed)
[facility] [place] Pul[facility] Rehabilitation Final/Discharge Outcome Results  Anthropometrics: . Height (inches): 60.5 . Weight (kg): 55.1 ? This is a change of +2.4kg from entrance. . Grip strength was measured using a Dynamometer.  The patient's discharge score was a 26.   ? This is a change of +4 from entrance.  Functional Status/Exercise Capacity: . [name] had a resting heart rate of 87 BPM, a resting blood pressure of 136/72, and an oxygen saturation of 97 % on room air.  [name] performed a discharge 6-minute walk test on 07/04/15.  The patient completed 1166 feet in 6 minutes with 0 rest breaks.  This quantifies 2.68 METS. The patient's goal is to add 82 feet onto the baseline 6MWT.   ? The patient increased their 6-minute walk test distance by +140 feet and their MET level by +0.23 METs.  Dyspnea Measures: . The [name] is a simple and standardized method of classifying disability in patients with COPD.  The assessment correlates disability and dyspnea.  Upon discharge the patients resting score was 1. The scale is provided below.  ? This is a change of 0 from entrance.  0= I only get breathless with strenuous exercise. 1= I get short of breath when hurrying on level ground or walking up a slight incline. 2= On level ground, I walk slower than people of the same age because of breathlessness, or have to stop for breath when walking at my own pace. 3= I stop for breath after walking 100 yards or after a few minutes on level ground. 4=I am too breathless to leave the house or I am breathless when dressing.   . The patient completed the [place] ([facility]).  This questionnaire relates activities of daily living and shortness of breath.  The score ranges from 0-120, a higher score relates to severe shortness of breath during activities of daily living. The patient's score at discharge was 54. ? This is a change of  +3 from entrance.  Quality of Life: . [name] and [name] Quality of Life Index Pulmonary Version is used to assess the patients satisfaction in different domains of their life; health and functioning, socioeconomic, psychological/spiritual, and family. The overall score is recorded out of 30 points.  The patient's goal is to achieve an overall score of 21 or higher.  [name] received a 23.03 upon discharge.  ? This is a change of -0.6 from entrance.  . The Patient Health Questionnaire (PHQ-2) is a first step approach for the screening of depression.  If the patient scores positive on the PHQ-2 the patient should be further assessed with the PHQ-9.  The Patient Health Questionnaire (PHQ-9) assesses the degree of depression.  Depression is important to monitor and track in pulmonary patients due to its prevalence in the population.  If the patient advances to the PHQ-9 the goal is to score less than 4 on this assessment.  [name] scored a 2 on the PHQ-2 an a 7 on the PHQ-9 at discharge. ? This is a change of +7 from entrance.   Clinical Assessment Tools: . The COPD Assessment Test (CAT) is a measurement tool to quantify how much of an impact the disease has on the patient's life.  This assessment aids the Pulmonary Rehab Team in designing the patients individualized treatment plan.  A CAT score ranges from 0-40.  A score of 10 or below indicates that COPD has a low impact on the patient's  life whereas a score of 30 or higher indicates a severe impact. The patient's goal is a decrease of 1 point from entrance to discharge.  [name] had a CAT score of 15 upon discharge.   ? This is a change of -3 from entrance.  Nutrition: . The "Rate My Plate" is a dietary assessment that quantifies the balance of a patient's diet.  This tool allows the Pulmonary Rehab Team to key in on the areas of the patient's diet that needs improving.  The team can then focus their nutritional education on those areas.  If the  patient scores 24-40, this means there are many ways they can make their eating habits healthier, 41-57 states that there are some ways they can make their eating habits healthier and a score of 58-72 states that they are making many healthy choices.  The patient's goal is to achieve a score of 49 or higher on this assessment.  [name] scored a 48 upon discharge.   ? This is a change of +2 from entrance.  Oxygen Compliance: . Patient is currently on RA at rest, 2 liters at night, and 2 liters for exercise.  [name] is not currently using cpap/bipap at night.  The patient is currently compliant.  The patient states that they do not have barriers that keep them from using their oxygen.   ? This is a no change result from entrance.    Education: . [name] attended more than 75% of the education classes.  [name] completed a discharge educational assessment and achieved a score of 14/14.  ? This is a change of +2 from entrance.   Smoking Cessation:  The patient is not currently smoking.  Exercise: . [name] was provided with an individualized Home Exercise Prescription (HEP) at the entrance of the program.  The patient's goal is to be exercising 30-60 minutes, 3-5 days per week. Upon discharge the patient is exercising 2-3 days at home.  This is a change of +2-3 from entrance.  After graduation from Pulmonary Rehab, [name] will continue exercising at the [facility].

## 2015-07-15 NOTE — Progress Notes (Signed)
Pulmonary Rehab Discharge Note [name] has been discharged from pulmonary rehab after successfully completing 24 exercise and education sessions. [name] improved her stamina and strength as evidenced by her ability to walk an additional 16940ft on her discharge 6 min walk test as compared to her initial one. [name] made great progress while in pulmonary rehab. She graduated from walking the track to being able to walk on the treadmill at 1.6/1. She also quit smoking while in the program. She had wanted to be able to wean of her oxygen, but was unable to do so. She did however make it back to silver sneakers and is now comfortable with wearing her oxygen while exercising at a public gym. Her PHQ2 scores did increase, but that is in response to her not being able to wean off O2. She is working through those issues and states that going to silver sneakers with her friends helps her mood. It was a pleasure having [name] in our program.

## 2015-07-16 ENCOUNTER — Encounter (HOSPITAL_COMMUNITY): Payer: Medicare HMO

## 2015-07-18 ENCOUNTER — Encounter (HOSPITAL_COMMUNITY): Payer: Medicare HMO

## 2015-07-23 ENCOUNTER — Encounter (HOSPITAL_COMMUNITY): Payer: Medicare HMO

## 2015-07-25 ENCOUNTER — Encounter (HOSPITAL_COMMUNITY): Payer: Medicare HMO

## 2015-08-20 ENCOUNTER — Ambulatory Visit: Payer: Medicare HMO | Admitting: Internal Medicine

## 2015-08-20 ENCOUNTER — Ambulatory Visit (INDEPENDENT_AMBULATORY_CARE_PROVIDER_SITE_OTHER): Payer: Medicare HMO | Admitting: Internal Medicine

## 2015-08-20 ENCOUNTER — Ambulatory Visit (INDEPENDENT_AMBULATORY_CARE_PROVIDER_SITE_OTHER)
Admission: RE | Admit: 2015-08-20 | Discharge: 2015-08-20 | Disposition: A | Discharge status: Home / Self Care | Payer: Medicare HMO | Source: Ambulatory Visit | Attending: Internal Medicine | Admitting: Internal Medicine

## 2015-08-20 ENCOUNTER — Encounter: Payer: Self-pay | Admitting: Internal Medicine

## 2015-08-20 VITALS — BP 132/70 | HR 90 | Ht 61.0 in | Wt 120.8 lb

## 2015-08-20 DIAGNOSIS — J449 Chronic obstructive pulmonary disease, unspecified: Secondary | ICD-10-CM

## 2015-08-20 DIAGNOSIS — J441 Chronic obstructive pulmonary disease with (acute) exacerbation: Secondary | ICD-10-CM

## 2015-08-20 DIAGNOSIS — J9611 Chronic respiratory failure with hypoxia: Secondary | ICD-10-CM

## 2015-08-20 DIAGNOSIS — F17200 Nicotine dependence, unspecified, uncomplicated: Secondary | ICD-10-CM

## 2015-08-20 DIAGNOSIS — R942 Abnormal results of pulmonary function studies: Secondary | ICD-10-CM

## 2015-08-20 MED ORDER — METHYLPREDNISOLONE ACETATE 80 MG/ML IJ SUSP
80.0000 mg | Freq: Once | INTRAMUSCULAR | Status: AC
  Administered 2015-08-20: 80 mg via INTRAMUSCULAR

## 2015-08-20 NOTE — Progress Notes (Signed)
Patient ID: [name], female    DOB: 1945/10/16, 69 y.o.   MRN: [id number]  HPI [age] yoF smoker followed for COPD, tobacco dependence and cough. [employer] EMR reviewed for conversion to Epic.Has had extensive efforts at smoking counseling (husband died of tobacco induced lung cancer).  Comes today with increased cough since March 2, with concerns of relation to mold abatement being done in home. Refrigerator line leaked, later floor buckled and on removal, mold found beneath flooring. Thinks she may have coughed more since recent water leak onto kitchen floor. Denies fever, sore throat or obvious infection. Denies reflux. Has been more wheezey and short of breath Using her Advair and using Combivent 3 x/ day.   06/23/11- 64 yoF smoker followed for COPD, tobacco dependence and cough. She has had additional stress recently, son just had an MI and coronary stent. She has not tried to stop smoking despite counseling. She found Symbicort to be no better than Advair. Changing from a dry powder inhaler had no effect on her cough. Using rescue inhaler a little more. New complaint: Feels tight through the neck and unable to relax her neck and shoulders. A neck brace helps.  08/14/11 - 64 yoF smoker followed for COPD, tobacco dependence and cough. Acute visit- she is still smoking against advice. Has had flu vaccine. Now complains of acute illness for the past week. Chest tight, short of breath. Cough was productive but now is dry. Denies fever, chest pain blood or palpitation. 5 days ago and she accepted a prescription from us for Z-Pak, just finished today, but she says "it never helps". She is particularly concerned about recurrence of mucus plugging, since she understands that was the problem related to a sustained exacerbation with hospital stay in the past. She is using her rescue inhaler every 4 hours. CXR- 06/26/11- stable COPD, NAD.  10/01/11- 64 yoF smoker followed for COPD, tobacco dependence  and cough.  Increased SOB with walking or at rest-gotten worse since Christmas; prior to this was sick 08-14-2011-never regrouped from this. Wakes up at night gasping for air-has had to use rescue inhaler a few times when this happens. She finished prednisone and antibiotic that we had called in December 24 and has now been off of those for about 2 weeks. Stays short of breath as described. We discussed pending change of Combivent to the new delivery device. She is anxious about any change from familiar. Using Xanax about once daily. PFT- 07/07/11- severe obstructive airways disease with response to bronchodilator, hyperinflation, diffusion moderately reduced. FEV1 0.73/38%, FEV1/FVC 0.37, DLCO 45%. Loop configuration is emphysema.  10/21/11- 64 yoF smoker followed for COPD, tobacco dependence and cough. Post hospital-  Pt states increase SOB upon activity ,wheezing,denies any cough.     Daughter [name] is here Hospitalized in January 17 through 10/12/2011. She got progressively worse after last visit here and went to the emergency room. Treated for COPD exacerbation. Now she still is not quite back to her baseline but says she has not smoked since leaving the hospital. I picked on this and try to reinforce it with support. Especially short of breath with any exertion. She is wearing oxygen but once the office so she can continue to live alone and do her own yard work. October I could not, as we could get her back off. Lisinopril was changed to Norvasc and I discussed potential for lisinopril to be associated with dry cough and some people. She is tapering off of prednisone. Echocardiogram  reviewed and discussed-pulmonary artery systolic   12/03/11- 64 yoF smoker followed for COPD, tobacco dependence and cough. She  still needs oxygen sometimes during the day. We discussed oxygen delivery devices. Little cough or phlegm. Had Pneumovax.  01/25/12- 64 yoF smoker followed for COPD, tobacco dependence and  cough.  Daughter here Acute visit-SOB getting worse in the past week; Prednisone was called in last week(took last pill this am); had head congestion, ears popping last week-called in ABX/ augmentin-not working. Friday called and was told to go to ER-pt did not go. Coughing-productive-looks like slight yellow in color and thick-feels better once getting it up; having to wear O2 several times during the day.   04/12/12-  64 yoF smoker followed for COPD, tobacco dependence and cough.   States about the same. c/o difficulty breathing and using oxygen more often due to the hot weather. Also occassional cough and wheezing.  COPD assessment test (CAT) score 20/40 She has a jury summons which we discussed. She is oxygen dependent and cannot count on her portable oxygen to last the length of a jury obligation. She stays indoors to avoid the heat and humidity, getting others to mow her yard.   07/14/12- 64 yoF smoker followed for COPD, tobacco dependence and cough.   Good and bad days with breathing; no more than usual SOB. COPD assessment test (CAT) score 15/40 Has had flu shot. Weather changes are bothering her. Avoids using her oxygen in the house if she is comfortable but sleeps with it every night- 2L/[employer]. Using metered inhaler once daily. Joined "Silver sneakers" exercise program. Mentions burning occipital pain and I suggested she look into evaluation of her cervical spine. Complains of easy bruising without blood. On baby aspirin.  09/23/12-66 yoF smoker followed for COPD, tobacco dependence and cough.  ACUTE VISIT: Increased SOB than normal; cough. (started feeling bad last week-was given Predn taper and Doxy RX) not feeling any better. Head gets "full" after coughing so much and able to small amount of mucus up. She has not felt well since December. In the last week we had called in prednisone and doxycycline which ended today without benefit. Short of breath, chest congestion, dry cough, full in  head, ears and nose. No fever now but feels weak. She only admits to smoking 2 or 3 cigarettes daily but that is still  CT chest 10/08/11 IMPRESSION:  No evidence of pulmonary embolism or other acute findings. Stable  emphysematous lung disease.  Original Report Authenticated By: Reola CalkinsGLENN T. YAMAGATA, M.D.   01/12/13- [age] yoF smoker followed for COPD, tobacco dependence and cough.  FOLLOWS FOR: feeling better since 09/2012 visit; Denies any wheezing or SOB. States allergies are flaring up-mowed the yard on Tuesday. Eyes burn and itch, blamed on pollen No real change in her chest or shortness of breath with exertion. Felt much better after Depo-Medrol last visit. Using oxygen only to sleep. Using Combivent inhaler one time daily.and Symbicort twice daily. Exercises at gym 4 days per week. Complains that calves tender and ache. [facility] did peripheral vascular evaluation, negative. Echocardiogram 10/06/2011-grade 2 diastolic dysfunction CXR 09/23/12 IMPRESSION:  COPD/chronic changes. No active cardiopulmonary disease.  Original Report Authenticated By: Charlett NoseKevin Dover, M.D.  03/20/13-  [age] yoF smoker followed for COPD, tobacco dependence and cough ACUTE VISIT: PT states she is unable to breath that she cant even get in the shower mowed yard-started having increased SOB since then-got worse over the weeked. Continues to use O2, nebulizer, and inhalers-slight increase in use.  Still smoking against repeated counseling Ok at baseline until riding mower dusty 6 days ago. Progressive SOB through week, clear mucus, scant. Needs to stay on O2 more of time. Using neb 1-3x/ day.  07/13/13- 67 yoF smoker followed for COPD, tobacco dependence and cough FOLLOWS FOR: having cough(started feeling bad on Sunday). SOB and wheezing. Increased shortness of breath with dyspnea on exertion especially in the last 4 days. Doesn't feel well. Cough interfering with sleep in recliner this week. Might have low-grade fever. Sore throat last  week. Unfortunately still smokes despite counseling. Has been going to [employer] for exercise and has usually felt comfortable off of oxygen during the daytime until this week.  01/16/14- 67 yoF smoker followed for COPD, tobacco dependence and cough FOLLOWS FOR:  Breathing doing well.  Some days better than others Complains of neck pain -Incidental.  Took left over prednisone in early March for her breathing, none since. Chronic cough and dyspnea without chest pain or discolored sputum.  03/12/14- 67 yoF smoker followed for COPD, tobacco dependence and cough ACUTE VISIT:  Increased sob, tightness in chest and some cough x1 week.  On antibiotics 6 days no relief 2 weks ago had sore throat then nose blowing, increased SOB. Took left-over prednisone. PCP gave levaquin 750 mg day 6/10, depomedrol. Has another prednisone taper to take if needed. L calf hurts with standing.  O2 2L CXR 01/16/14 IMPRESSION:  No evidence of acute cardiopulmonary disease.  Electronically Signed  By: Charline BillsSriyesh Krishnan M.D.  On: 01/16/2014 16:47  06/05/14- [age] yoF smoker followed for COPD, tobacco dependence and cough FOLLOWS FOR: Pt c/o increase in SOB, intermittent cough with some mucus production x 2days. Pt denies CP/tightness and f/c/s. Pt states she has been using her flutter valve to help with the chest congestion.  And admits to not feeling well for 2-3 weeks with increase chest tightness gradually worse. She took amoxicillin and prednisone we have given her to hold, but did not help. Breathing is not disturbing her sleep. Little sputum, nothing purulent and no fever or chest pain. Using nebulizer twice daily. Likes Symbicort. Albuterol substituted for Combivent because of insurance.  07/19/14- 68 yoF smoker followed for COPD, tobacco dependence and cough FOLLOW FOR: COPD  She still has cough and sob. She denies chest congestion. She has not been using the flutter valve. Concerned about relative cost of  albuterol inhaler and Combivent inhaler-prefers Combivent.  09/06/14- acute visit. Quick work in because she asked to come for Depo-Medrol injection. FOLLOWS FOR: Pt states she is having slight SOB with wheezing since last visit. Denies any cough. Had to take Augmentin and Pred[facility] all through [facility].   10/18/14- 68 yoF smoker followed for COPD, tobacco dependence and cough FOLLOWS FOR: Pt states she is having slight SOB with wheezing since last visit. Denies any cough. Had to take Augmentin and Prednisone Rx [facility]stayed sick all through [facility]. She is now "hanging in there", not acutely ill. Using oxygen 2 L/ [employer] for sleep and as needed. Asks med refills to hold.  12/12/14- 68 yoF smoker followed for COPD, tobacco dependence and cough Follow For: Increased sob over past few weeks - Denies cough , chest tightness or wheezing increased shortness of breath in the last 3 or 4 days particularly but without acute event. Using her nebulizer more. Denies fever, purulent sputum, sore throat, chest pain or palpitation. Legs swell  a little but not new. Using an incentive spirometer and Flutter. Has leftover prescriptions for amoxicillin and prednisone from January. Remains on oxygen at 2-3 L/ [employer].  02/14/15- 68 yoF smoker followed for COPD, tobacco dependence and cough Reports: x2wks has been having SOB, taking predinsone on 5.21 has one pill left.2L O2  [employer] Dyspnea is affecting her sleep. No fever or purulent sputum. Using Combivent rescue inhaler twice daily. CXR 12/12/14 IMPRESSION: Emphysema without superimposed pneumonia. Electronically Signed  By: Marnee SpringJonathon Watts M.D.  On: 12/12/2014 10:08  03/21/15- 68 yoF smoker followed for COPD, tobacco dependence and cough Reports: x2wks has been having SOB, taking predinsone on 5.21 has one pill left. 2L O2  [employer]   daughter here Follows For: Pt  c/o fluid retention, SOB with activity. States she uses 2L daily. Slight chest congestion/tightness. Occasional prod cough with mucus.  Post Hosp 6/22-25/16 acute on chronic resp failure, COPD Gold III,hyponatremia Saw her PCP yesterday- Na up to 133. We discussed sodium/potassium/Lasix and I explained this would be best managed by her primary physician who can watch her. We reviewed chest x-ray report and images CXR 03/14/15 IMPRESSION: Stable emphysematous changes without acute abnormality. Interval clearing in the right lung base. Imaging findings of potential clinical significance: Aortic Atherosclerosis Electronically Signed  By: Alcide CleverMark Lukens M.D.  On: 03/14/2015 08:22  05/20/15- [age] yoF former smoker followed for COPD/Gold D, tobacco dependence and cough Follows For: Sob worse this past week, mostly with exertion - Chest congestion - Occas prod cough (white) - Occas wheezing - Increased diff at pulm rehab for past 2 weeks Quit smoking in June but admits now she is smoking "a couple of cigarettes" in the evening outside. We discussed this She has been in Pulmonary Rehabilitation for 5 weeks now but has found the last 2 weeks physically harder. She is starting each day using her nebulizer machine. She wants to continue using Combivent as her rescue inhaler. We discussed use of longer acting bronchodilator in her nebulizer machine.  08/20/2015-69 year old female former smoker followed for COPD mixed type/Gold D, chronic hypoxic respiratory failure, complicated by anxiety, hyponatremia, HBP, aortic atherosclerosis O2 2 L/[employer] sleep/exertion FOLLOWS FOR:c/o increased sob over the last mth. gradually getting worse,cough esp. in am-thick clear,feels like going to "smother" if can't get it up,occass. wheezing,midchest tightness and pain comes and goes,not sleeping well due to sob, sleeping on 2 pillows,no fcs.Has not used Brovana samples. Flu vaccine UTD More aware of dyspnea, especially noticing  nasal stuffiness in the morning, over the last month, coincident with use of indoor heat. Nebulizer used each morning without much effect. Still likes Symbicort, needing financial assistance. Graduated from pulmonary rehabilitation mid October and started going to the [facility] for treadmill 4 days a week. Using oxygen continuously now.  Review of Systems-see HPI Constitutional:   No-   weight loss, night sweats, fevers, chills, fatigue, lassitude. HEENT:   No-  headaches, difficulty swallowing, tooth/dental problems, sore throat,       No- sneezing, itching, no-ear ache, + nasal congestion, post nasal drip,  CV:  No- chest pain, orthopnea, PND, +swelling in lower extremities, anasarca, dizziness, palpitations.  Resp: Per HPI-     +shortness of breath with exertion or at rest.                productive cough,  -productive cough,  No-  coughing up of blood.              No-  change in color of mucus.  wheezing.   Skin: No-   rash or lesions. GI:  No-   heartburn, indigestion, abdominal pain, nausea, vomiting,  GU: MS:   neck pain w/o radicular symptoms Neuro-  Psych:  No- change in mood or affect. depression or anxiety.  No memory loss.   Objective:   Physical Exam  General- Alert, Oriented, Affect+ anxious, Distress- none acute, conversational on O2, + very frail Skin- + steroid ecchymoses on forearms Lymphadenopathy- none Head- atraumatic            Eyes- Gross vision intact, PERRLA, conjunctivae clear secretions            Ears- Hearing, canals-normal            Nose- + + turbinate edema, no-Septal dev,  polyps, erosion, perforation. .             Throat- Mallampati II , mucosa clear , drainage- none, tonsils- atrophic.   Neck-  trachea midline, no stridor , thyroid nl, carotid no bruit.+ cervical kyphosis Chest - symmetrical excursion , unlabored           Heart/CV- RRR , no murmur , no gallop  , no rub, nl s1 s2                           - JVD- none , edema+1, stasis changes- none,  varices- none           Lung- distant , wheeze + mild, cough+ bronchitic type , dullness-none, rub- none           Chest wall- + kyphosis Abd- Br/ Gen/ Rectal- Not done, not indicated Extrem- no cyanosis, edema or clubbing Neuro- grossly intact to observation

## 2015-08-20 NOTE — Assessment & Plan Note (Signed)
She volunteers that she has not smoked, probably in a few months. Strongly encouraged to stay off this time.

## 2015-08-20 NOTE — Assessment & Plan Note (Signed)
Using oxygen continuously now

## 2015-08-20 NOTE — Assessment & Plan Note (Signed)
Plan-update chest x-ray 

## 2015-08-20 NOTE — Patient Instructions (Signed)
Sample x 3 Symbicort 160   2 puffs then rinse mouth, twice daily  Order- CXR    Dx Copd mixed type  Depo 80

## 2015-08-20 NOTE — Assessment & Plan Note (Signed)
Symptomatic exacerbation may partly reflect nasal stuffiness associated with indoor heat while she sleeps. She does have mild unlabored wheezing today. Plan-Depo-Medrol, chest x-ray, samples of Symbicort 160

## 2015-08-21 NOTE — Progress Notes (Signed)
Quick Note:  Contacted pt with results per CY Pt expressed understanding, nothing further needed ______ 

## 2015-08-22 ENCOUNTER — Telehealth: Payer: Self-pay | Admitting: Internal Medicine

## 2015-08-22 NOTE — Telephone Encounter (Signed)
ATC pt, went straight to VM. This has not been set up yet. WCB

## 2015-08-23 NOTE — Telephone Encounter (Signed)
Pt states that [employer] is faxing over a refill for her Symbicort. Please advise Florentina AddisonKatie if you have received anything yet. Thanks.

## 2015-08-26 NOTE — Telephone Encounter (Signed)
[facility] has completed form and [facility]  [phone]. Thanks.

## 2015-08-26 NOTE — Telephone Encounter (Signed)
Called made pt aware. Nothing further needed 

## 2015-08-29 ENCOUNTER — Telehealth: Payer: Self-pay | Admitting: Internal Medicine

## 2015-08-29 NOTE — Telephone Encounter (Signed)
Spoke with pt, states that [employer] is faxing back a form for clarification on how pt is to take Symbicort- states handwriting on original form was illegible. Checked up front, form has been received.  Form has been filled out to the best of my ability and left on CY's cart.  Forwarding to Katie to follow up on.

## 2015-08-30 NOTE — Telephone Encounter (Signed)
I filled out form personally and placed it on [name]'s cart yesterday afternoon.  Please advise if he may have it still.  Thanks!

## 2015-08-30 NOTE — Telephone Encounter (Signed)
No forms on patient has come across my desk or CY's. Please have them re-fax forms. Thanks.

## 2015-08-30 NOTE — Telephone Encounter (Signed)
Katie, please advise on the status of this form. Thanks

## 2015-08-30 NOTE — Telephone Encounter (Signed)
I think this went to fax.   Instruction for Symbicort 160    Inhale 2 puffs then rinse mouth, twice daily

## 2015-08-30 NOTE — Telephone Encounter (Signed)
Called [employer] at [phone], form being refaxed to office.  Will await fax.

## 2015-09-02 NOTE — Telephone Encounter (Signed)
[facility] to ensure follow up.

## 2015-09-02 NOTE — Telephone Encounter (Signed)
Form signed by CY today and faxed back to [phone]. Thanks.

## 2015-09-02 NOTE — Telephone Encounter (Signed)
Spoke with patient-she is aware that forms were faxed back this morning and confirmed of faxing going through. Nothing more needed at this time.

## 2015-11-22 ENCOUNTER — Ambulatory Visit (INDEPENDENT_AMBULATORY_CARE_PROVIDER_SITE_OTHER): Payer: Medicare HMO | Admitting: Internal Medicine

## 2015-11-22 ENCOUNTER — Telehealth: Payer: Self-pay | Admitting: Internal Medicine

## 2015-11-22 ENCOUNTER — Encounter: Payer: Self-pay | Admitting: Internal Medicine

## 2015-11-22 VITALS — BP 112/62 | HR 93 | Ht 61.0 in | Wt 124.4 lb

## 2015-11-22 DIAGNOSIS — J449 Chronic obstructive pulmonary disease, unspecified: Secondary | ICD-10-CM | POA: Diagnosis not present

## 2015-11-22 DIAGNOSIS — F17200 Nicotine dependence, unspecified, uncomplicated: Secondary | ICD-10-CM | POA: Diagnosis not present

## 2015-11-22 DIAGNOSIS — J441 Chronic obstructive pulmonary disease with (acute) exacerbation: Secondary | ICD-10-CM | POA: Diagnosis not present

## 2015-11-22 DIAGNOSIS — J9611 Chronic respiratory failure with hypoxia: Secondary | ICD-10-CM

## 2015-11-22 MED ORDER — PREDNISONE 10 MG PO TABS: ORAL_TABLET | ORAL | Status: DC

## 2015-11-22 MED ORDER — METHYLPREDNISOLONE ACETATE 80 MG/ML IJ SUSP
80.0000 mg | Freq: Once | INTRAMUSCULAR | Status: AC
  Administered 2015-11-22: 80 mg via INTRAMUSCULAR

## 2015-11-22 NOTE — Telephone Encounter (Signed)
Advised pt that we are still waiting to hear back from CY.  CY - please advise. Thanks.

## 2015-11-22 NOTE — Assessment & Plan Note (Signed)
Discussed oxygen management. Consider adding an oxygen conserving device later. May range oxygen 2-4 L

## 2015-11-22 NOTE — Telephone Encounter (Signed)
Pt calling back on this please advise.Caren GriffinsStanley A Dalton

## 2015-11-22 NOTE — Progress Notes (Signed)
Patient ID: [name], female    DOB: 1945/10/16, 70 y.o.   MRN: [id number]  HPI [age] yoF smoker followed for COPD, tobacco dependence and cough. [employer] EMR reviewed for conversion to Epic.Has had extensive efforts at smoking counseling (husband died of tobacco induced lung cancer).  Comes today with increased cough since March 2, with concerns of relation to mold abatement being done in home. Refrigerator line leaked, later floor buckled and on removal, mold found beneath flooring. Thinks she may have coughed more since recent water leak onto kitchen floor. Denies fever, sore throat or obvious infection. Denies reflux. Has been more wheezey and short of breath Using her Advair and using Combivent 3 x/ day.   06/23/11- 64 yoF smoker followed for COPD, tobacco dependence and cough. She has had additional stress recently, son just had an MI and coronary stent. She has not tried to stop smoking despite counseling. She found Symbicort to be no better than Advair. Changing from a dry powder inhaler had no effect on her cough. Using rescue inhaler a little more. New complaint: Feels tight through the neck and unable to relax her neck and shoulders. A neck brace helps.  08/14/11 - 64 yoF smoker followed for COPD, tobacco dependence and cough. Acute visit- she is still smoking against advice. Has had flu vaccine. Now complains of acute illness for the past week. Chest tight, short of breath. Cough was productive but now is dry. Denies fever, chest pain blood or palpitation. 5 days ago and she accepted a prescription from us for Z-Pak, just finished today, but she says "it never helps". She is particularly concerned about recurrence of mucus plugging, since she understands that was the problem related to a sustained exacerbation with hospital stay in the past. She is using her rescue inhaler every 4 hours. CXR- 06/26/11- stable COPD, NAD.  10/01/11- 64 yoF smoker followed for COPD, tobacco dependence  and cough.  Increased SOB with walking or at rest-gotten worse since Christmas; prior to this was sick 08-14-2011-never regrouped from this. Wakes up at night gasping for air-has had to use rescue inhaler a few times when this happens. She finished prednisone and antibiotic that we had called in December 24 and has now been off of those for about 2 weeks. Stays short of breath as described. We discussed pending change of Combivent to the new delivery device. She is anxious about any change from familiar. Using Xanax about once daily. PFT- 07/07/11- severe obstructive airways disease with response to bronchodilator, hyperinflation, diffusion moderately reduced. FEV1 0.73/38%, FEV1/FVC 0.37, DLCO 45%. Loop configuration is emphysema.  10/21/11- 64 yoF smoker followed for COPD, tobacco dependence and cough. Post hospital-  Pt states increase SOB upon activity ,wheezing,denies any cough.     Daughter [name] is here Hospitalized in January 17 through 10/12/2011. She got progressively worse after last visit here and went to the emergency room. Treated for COPD exacerbation. Now she still is not quite back to her baseline but says she has not smoked since leaving the hospital. I picked on this and try to reinforce it with support. Especially short of breath with any exertion. She is wearing oxygen but once the office so she can continue to live alone and do her own yard work. October I could not, as we could get her back off. Lisinopril was changed to Norvasc and I discussed potential for lisinopril to be associated with dry cough and some people. She is tapering off of prednisone. Echocardiogram  reviewed and discussed-pulmonary artery systolic   12/03/11- 64 yoF smoker followed for COPD, tobacco dependence and cough. She  still needs oxygen sometimes during the day. We discussed oxygen delivery devices. Little cough or phlegm. Had Pneumovax.  01/25/12- 64 yoF smoker followed for COPD, tobacco dependence and  cough.  Daughter here Acute visit-SOB getting worse in the past week; Prednisone was called in last week(took last pill this am); had head congestion, ears popping last week-called in ABX/ augmentin-not working. Friday called and was told to go to ER-pt did not go. Coughing-productive-looks like slight yellow in color and thick-feels better once getting it up; having to wear O2 several times during the day.   04/12/12-  64 yoF smoker followed for COPD, tobacco dependence and cough.   States about the same. c/o difficulty breathing and using oxygen more often due to the hot weather. Also occassional cough and wheezing.  COPD assessment test (CAT) score 20/40 She has a jury summons which we discussed. She is oxygen dependent and cannot count on her portable oxygen to last the length of a jury obligation. She stays indoors to avoid the heat and humidity, getting others to mow her yard.   07/14/12- 64 yoF smoker followed for COPD, tobacco dependence and cough.   Good and bad days with breathing; no more than usual SOB. COPD assessment test (CAT) score 15/40 Has had flu shot. Weather changes are bothering her. Avoids using her oxygen in the house if she is comfortable but sleeps with it every night- 2L/[employer]. Using metered inhaler once daily. Joined "[employer]" exercise program. Mentions burning occipital pain and I suggested she look into evaluation of her cervical spine. Complains of easy bruising without blood. On baby aspirin.  09/23/12-66 yoF smoker followed for COPD, tobacco dependence and cough.  ACUTE VISIT: Increased SOB than normal; cough. (started feeling bad last week-was given Predn taper and Doxy RX) not feeling any better. Head gets "full" after coughing so much and able to small amount of mucus up. She has not felt well since December. In the last week we had called in prednisone and doxycycline which ended today without benefit. Short of breath, chest congestion, dry cough, full in  head, ears and nose. No fever now but feels weak. She only admits to smoking 2 or 3 cigarettes daily but that is still  CT chest 10/08/11 IMPRESSION:  No evidence of pulmonary embolism or other acute findings. Stable  emphysematous lung disease.  Original Report Authenticated By: [name], M.D.   01/12/13- [age] yoF smoker followed for COPD, tobacco dependence and cough.  FOLLOWS FOR: feeling better since 09/2012 visit; Denies any wheezing or SOB. States allergies are flaring up-mowed the yard on Tuesday. Eyes burn and itch, blamed on pollen No real change in her chest or shortness of breath with exertion. Felt much better after Depo-Medrol last visit. Using oxygen only to sleep. Using Combivent inhaler one time daily.and Symbicort twice daily. Exercises at gym 4 days per week. Complains that calves tender and ache. [facility] did peripheral vascular evaluation, negative. Echocardiogram 10/06/2011-grade 2 diastolic dysfunction CXR 09/23/12 IMPRESSION:  COPD/chronic changes. No active cardiopulmonary disease.  Original Report Authenticated By: [name], M.D.  03/20/13-  [age] yoF smoker followed for COPD, tobacco dependence and cough ACUTE VISIT: PT states she is unable to breath that she cant even get in the shower mowed yard-started having increased SOB since then-got worse over the weeked. Continues to use O2, nebulizer, and inhalers-slight increase in use.  Still smoking against repeated counseling Ok at baseline until riding mower dusty 6 days ago. Progressive SOB through week, clear mucus, scant. Needs to stay on O2 more of time. Using neb 1-3x/ day.  07/13/13- 67 yoF smoker followed for COPD, tobacco dependence and cough FOLLOWS FOR: having cough(started feeling bad on Sunday). SOB and wheezing. Increased shortness of breath with dyspnea on exertion especially in the last 4 days. Doesn't feel well. Cough interfering with sleep in recliner this week. Might have low-grade fever. Sore throat last  week. Unfortunately still smokes despite counseling. Has been going to [employer] for exercise and has usually felt comfortable off of oxygen during the daytime until this week.  01/16/14- 67 yoF smoker followed for COPD, tobacco dependence and cough FOLLOWS FOR:  Breathing doing well.  Some days better than others Complains of neck pain -Incidental.  Took left over prednisone in early March for her breathing, none since. Chronic cough and dyspnea without chest pain or discolored sputum.  03/12/14- 67 yoF smoker followed for COPD, tobacco dependence and cough ACUTE VISIT:  Increased sob, tightness in chest and some cough x1 week.  On antibiotics 6 days no relief 2 weks ago had sore throat then nose blowing, increased SOB. Took left-over prednisone. PCP gave levaquin 750 mg day 6/10, depomedrol. Has another prednisone taper to take if needed. L calf hurts with standing.  O2 2L CXR 01/16/14 IMPRESSION:  No evidence of acute cardiopulmonary disease.  Electronically Signed  By: Charline BillsSriyesh Krishnan M.D.  On: 01/16/2014 16:47  06/05/14- [age] yoF smoker followed for COPD, tobacco dependence and cough FOLLOWS FOR: Pt c/o increase in SOB, intermittent cough with some mucus production x 2days. Pt denies CP/tightness and f/c/s. Pt states she has been using her flutter valve to help with the chest congestion.  And admits to not feeling well for 2-3 weeks with increase chest tightness gradually worse. She took amoxicillin and prednisone we have given her to hold, but did not help. Breathing is not disturbing her sleep. Little sputum, nothing purulent and no fever or chest pain. Using nebulizer twice daily. Likes Symbicort. Albuterol substituted for Combivent because of insurance.  07/19/14- 68 yoF smoker followed for COPD, tobacco dependence and cough FOLLOW FOR: COPD  She still has cough and sob. She denies chest congestion. She has not been using the flutter valve. Concerned about relative cost of  albuterol inhaler and Combivent inhaler-prefers Combivent.  09/06/14- acute visit. Quick work in because she asked to come for Depo-Medrol injection. FOLLOWS FOR: Pt states she is having slight SOB with wheezing since last visit. Denies any cough. Had to take Augmentin and [facility] all through [facility].   10/18/14- 68 yoF smoker followed for COPD, tobacco dependence and cough FOLLOWS FOR: Pt states she is having slight SOB with wheezing since last visit. Denies any cough. Had to take Augmentin and Prednisone Rx [facility]stayed sick all through [facility]. She is now "hanging in there", not acutely ill. Using oxygen 2 L/ [employer] for sleep and as needed. Asks med refills to hold.  12/12/14- 68 yoF smoker followed for COPD, tobacco dependence and cough Follow For: Increased sob over past few weeks - Denies cough , chest tightness or wheezing increased shortness of breath in the last 3 or 4 days particularly but without acute event. Using her nebulizer more. Denies fever, purulent sputum, sore throat, chest pain or palpitation. Legs swell  a little but not new. Using an incentive spirometer and Flutter. Has leftover prescriptions for amoxicillin and prednisone from January. Remains on oxygen at 2-3 L/ [employer].  02/14/15- 68 yoF smoker followed for COPD, tobacco dependence and cough Reports: x2wks has been having SOB, taking predinsone on 5.21 has one pill left.2L O2  [employer] Dyspnea is affecting her sleep. No fever or purulent sputum. Using Combivent rescue inhaler twice daily. CXR 12/12/14 IMPRESSION: Emphysema without superimposed pneumonia. Electronically Signed  By: [name] M.D.  On: 12/12/2014 10:08  03/21/15- 68 yoF smoker followed for COPD, tobacco dependence and cough Reports: x2wks has been having SOB, taking predinsone on 5.21 has one pill left. 2L O2  [employer]   daughter here Follows For: Pt  c/o fluid retention, SOB with activity. States she uses 2L daily. Slight chest congestion/tightness. Occasional prod cough with mucus.  Post Hosp 6/22-25/16 acute on chronic resp failure, COPD Gold III,hyponatremia Saw her PCP yesterday- Na up to 133. We discussed sodium/potassium/Lasix and I explained this would be best managed by her primary physician who can watch her. We reviewed chest x-ray report and images CXR 03/14/15 IMPRESSION: Stable emphysematous changes without acute abnormality. Interval clearing in the right lung base. Imaging findings of potential clinical significance: Aortic Atherosclerosis Electronically Signed  By: Kaitlyn CleverMark Lukens M.D.  On: 03/14/2015 08:22  05/20/15- [age] yoF former smoker followed for COPD/Gold D, tobacco dependence and cough Follows For: Sob worse this past week, mostly with exertion - Chest congestion - Occas prod cough (white) - Occas wheezing - Increased diff at pulm rehab for past 2 weeks Quit smoking in June but admits now she is smoking "a couple of cigarettes" in the evening outside. We discussed this She has been in Pulmonary Rehabilitation for 5 weeks now but has found the last 2 weeks physically harder. She is starting each day using her nebulizer machine. She wants to continue using Combivent as her rescue inhaler. We discussed use of longer acting bronchodilator in her nebulizer machine.  08/20/2015-70 year old female former smoker followed for COPD mixed type/Gold D, chronic hypoxic respiratory failure, complicated by anxiety, hyponatremia, HBP, aortic atherosclerosis O2 2 L/[employer] sleep/exertion FOLLOWS FOR:c/o increased sob over the last mth. gradually getting worse,cough esp. in am-thick clear,feels like going to "smother" if can't get it up,occass. wheezing,midchest tightness and pain comes and goes,not sleeping well due to sob, sleeping on 2 pillows,no fcs.Has not used Brovana samples. Flu vaccine UTD More aware of dyspnea, especially noticing  nasal stuffiness in the morning, over the last month, coincident with use of indoor heat. Nebulizer used each morning without much effect. Still likes Symbicort, needing financial assistance. Graduated from pulmonary rehabilitation mid October and started going to the [facility] for treadmill 4 days a week. Using oxygen continuously now.  11/22/2015-70 year old female former smoker followed for COPD mixed type/Gold D, chronic hypoxic respiratory failure, complicated by anxiety, hyponatremia, HBP, aortic atherosclerosis O2 2 L/[employer] sleep/exertion     daughter here Reports increased SOB for 1 week. Denies any constant chest tightness, wheezing or coughing Using neb meds more frequently. Increased dyspnea on exertion noted "off and on" but perhaps more than usual this week. Prednisone and antibiotic take around January 28, left over, made a little help only. Admits occasional cough and phlegm which is hard to clear but no fever or chills blood chest pain or frankly purulent sputum. Her portable concentrator battery doesn't last very long so she prefers portable tank oxygen. Her PCP checks labs-last sodium 134. CXR 08/20/2015-No active cardiopulmonary  disease. Emphysema.  Review of Systems-see HPI Constitutional:   No-   weight loss, night sweats, fevers, chills, fatigue, lassitude. HEENT:   No-  headaches, difficulty swallowing, tooth/dental problems, sore throat,       No- sneezing, itching, no-ear ache, + nasal congestion, post nasal drip,  CV:  No- chest pain, orthopnea, PND, +swelling in lower extremities, anasarca, dizziness, palpitations.  Resp:    +shortness of breath with exertion or at rest.               + productive cough,  -productive cough,  No-  coughing up of blood.              No-  change in color of mucus.   wheezing.   Skin: No-   rash or lesions. GI:  No-   heartburn, indigestion, abdominal pain, nausea, vomiting,  GU: MS:   neck pain w/o radicular symptoms Neuro-  Psych:  No-  change in mood or affect. depression or anxiety.  No memory loss.   Objective:   Physical Exam  General- Alert, Oriented, Affect+ anxious, Distress- none acute, conversational on O2, + very frail Skin- + steroid ecchymoses on forearms Lymphadenopathy- none Head- atraumatic            Eyes- Gross vision intact, PERRLA, conjunctivae clear secretions            Ears- Hearing, canals-normal            Nose- + + turbinate edema, no-Septal dev,  polyps, erosion, perforation. .             Throat- Mallampati II , mucosa clear , drainage- none, tonsils- atrophic.   Neck-  trachea midline, no stridor , thyroid nl, carotid no bruit.+ cervical kyphosis Chest - symmetrical excursion , unlabored           Heart/CV- RRR , no murmur , no gallop  , no rub, nl s1 s2                           - JVD- none , edema+1, stasis changes- none, varices- none           Lung- distant , wheeze -none, cough-none , dullness-none, rub- none           Chest wall- + kyphosis Abd- Br/ Gen/ Rectal- Not done, not indicated Extrem- no cyanosis, edema or clubbing Neuro- grossly intact to observation

## 2015-11-22 NOTE — Assessment & Plan Note (Signed)
Exacerbation, nonspecific. Plan-continue usual meds. Depo-Medrol. They range oxygen 2-4 L

## 2015-11-22 NOTE — Assessment & Plan Note (Signed)
Has been in remission again for the last 4 months-encouraged to continue.

## 2015-11-22 NOTE — Telephone Encounter (Signed)
Spoke with pt. Reports increased SOB for 1 week. Denies chest tightness, wheezing or coughing. Has been using Symbicort, Combivent Respimat and Duoneb without relief. While speaking to her she was having a hard time finishing her sentence without having to stop to catch her breath. Pt would like CY's recommendations. CY - please advise. Thanks.  No Known Allergies Current Outpatient Prescriptions on File Prior to Visit  Medication Sig Dispense Refill  . ALPRAZolam (XANAX) 0.5 MG tablet TAKE 1 TABLET THREE TIMES DAILY AS NEEDED FOR ANXIETY  OR  SLEEP 60 tablet 5  . aspirin 81 MG tablet Take 81 mg by mouth every other day. Take one tablet every other day    . budesonide-formoterol (SYMBICORT) 160-4.5 MCG/ACT inhaler 2 puffs then rinse mouth, twice daily 3 Inhaler 3  . guaiFENesin (MUCINEX) 600 MG 12 hr tablet Take 1 tablet (600 mg total) by mouth 2 (two) times daily. 60 tablet 0  . Ipratropium-Albuterol (COMBIVENT) 20-100 MCG/ACT AERS respimat Inhale 1 puff into the lungs every 6 (six) hours. 3 Inhaler 3  . ipratropium-albuterol (DUONEB) 0.5-2.5 (3) MG/3ML SOLN USE ONE VIAL VIA NEBULIZER 2 TIMES DAILY 360 mL 5  . lisinopril (PRINIVIL,ZESTRIL) 20 MG tablet Take 20 mg by mouth daily.    . Multiple Vitamins-Minerals (PRESERVISION AREDS 2 PO) Take 2 capsules by mouth daily.    . nicotine (NICODERM CQ - DOSED IN MG/24 HOURS) 14 mg/24hr patch Place 1 patch (14 mg total) onto the skin daily. (Patient not taking: Reported on 03/21/2015) 28 patch 0  . [DISCONTINUED] amLODipine (NORVASC) 10 MG tablet Take 1 tablet (10 mg total) by mouth daily. 30 tablet 3  . [DISCONTINUED] pantoprazole (PROTONIX) 40 MG tablet Take 1 tablet (40 mg total) by mouth 2 (two) times daily before a meal. 60 tablet 3   No current facility-administered medications on file prior to visit.

## 2015-11-22 NOTE — Patient Instructions (Signed)
Depo 280        Ok to try using your furosemide 20 mg every other day as needed for fluid retention  Script to hold for prednisone printed to use if needed  Ok to use your portable oxygen at 2-4 L.   At rest, use 2 l

## 2015-11-22 NOTE — Telephone Encounter (Signed)
Offer prednisone taper 10 mg, # 20, 4 X 2 DAYS, 3 X 2 DAYS, 2 X 2 DAYS, 1 X 2 DAYS  If she feels she needs to be seen I could work her in at the end of my morning today

## 2015-11-22 NOTE — Telephone Encounter (Signed)
Pt would rather come in for an appointment. She has been added to CY's schedule today at 11:30am. Nothing further was needed.

## 2015-11-25 ENCOUNTER — Inpatient Hospital Stay (HOSPITAL_COMMUNITY)
Admission: EM | Admit: 2015-11-25 | Discharge: 2015-12-02 | DRG: 190 | Disposition: A | Discharge status: Home / Self Care | Payer: Medicare HMO | Attending: Internal Medicine | Admitting: Internal Medicine

## 2015-11-25 ENCOUNTER — Emergency Department (HOSPITAL_COMMUNITY): Payer: Medicare HMO

## 2015-11-25 ENCOUNTER — Ambulatory Visit: Payer: Medicare HMO | Admitting: Internal Medicine

## 2015-11-25 ENCOUNTER — Encounter (HOSPITAL_COMMUNITY): Payer: Self-pay | Admitting: Family Medicine

## 2015-11-25 DIAGNOSIS — E878 Other disorders of electrolyte and fluid balance, not elsewhere classified: Secondary | ICD-10-CM | POA: Diagnosis present

## 2015-11-25 DIAGNOSIS — R000 Tachycardia, unspecified: Secondary | ICD-10-CM | POA: Diagnosis not present

## 2015-11-25 DIAGNOSIS — R609 Edema, unspecified: Secondary | ICD-10-CM

## 2015-11-25 DIAGNOSIS — Z87891 Personal history of nicotine dependence: Secondary | ICD-10-CM | POA: Diagnosis not present

## 2015-11-25 DIAGNOSIS — J9600 Acute respiratory failure, unspecified whether with hypoxia or hypercapnia: Secondary | ICD-10-CM | POA: Diagnosis present

## 2015-11-25 DIAGNOSIS — J9601 Acute respiratory failure with hypoxia: Secondary | ICD-10-CM | POA: Diagnosis not present

## 2015-11-25 DIAGNOSIS — J441 Chronic obstructive pulmonary disease with (acute) exacerbation: Secondary | ICD-10-CM | POA: Diagnosis present

## 2015-11-25 DIAGNOSIS — E872 Acidosis: Secondary | ICD-10-CM | POA: Diagnosis present

## 2015-11-25 DIAGNOSIS — J9621 Acute and chronic respiratory failure with hypoxia: Secondary | ICD-10-CM | POA: Diagnosis present

## 2015-11-25 DIAGNOSIS — F419 Anxiety disorder, unspecified: Secondary | ICD-10-CM | POA: Diagnosis present

## 2015-11-25 DIAGNOSIS — R0600 Dyspnea, unspecified: Secondary | ICD-10-CM

## 2015-11-25 DIAGNOSIS — R739 Hyperglycemia, unspecified: Secondary | ICD-10-CM | POA: Diagnosis present

## 2015-11-25 DIAGNOSIS — R0603 Acute respiratory distress: Secondary | ICD-10-CM

## 2015-11-25 DIAGNOSIS — Z7951 Long term (current) use of inhaled steroids: Secondary | ICD-10-CM | POA: Diagnosis not present

## 2015-11-25 DIAGNOSIS — Z7982 Long term (current) use of aspirin: Secondary | ICD-10-CM | POA: Diagnosis not present

## 2015-11-25 DIAGNOSIS — I10 Essential (primary) hypertension: Secondary | ICD-10-CM | POA: Diagnosis present

## 2015-11-25 DIAGNOSIS — R0602 Shortness of breath: Secondary | ICD-10-CM | POA: Diagnosis present

## 2015-11-25 LAB — I-STAT ARTERIAL BLOOD GAS, ED
Acid-Base Excess: 2 mmol/L (ref 0.0–2.0)
Bicarbonate: 29.1 mEq/L — ABNORMAL HIGH (ref 20.0–24.0)
O2 SAT: 99 %
PCO2 ART: 54.8 mmHg — AB (ref 35.0–45.0)
PO2 ART: 172 mmHg — AB (ref 80.0–100.0)
TCO2: 31 mmol/L (ref 0–100)
pH, Arterial: 7.333 — ABNORMAL LOW (ref 7.350–7.450)

## 2015-11-25 LAB — CBC WITH DIFFERENTIAL/PLATELET
BASOS ABS: 0.1 10*3/uL (ref 0.0–0.1)
BASOS PCT: 1 %
EOS PCT: 2 %
Eosinophils Absolute: 0.2 10*3/uL (ref 0.0–0.7)
HCT: 38.1 % (ref 36.0–46.0)
Hemoglobin: 12.4 g/dL (ref 12.0–15.0)
Lymphocytes Relative: 18 %
Lymphs Abs: 1.4 10*3/uL (ref 0.7–4.0)
MCH: 28.6 pg (ref 26.0–34.0)
MCHC: 32.5 g/dL (ref 30.0–36.0)
MCV: 88 fL (ref 78.0–100.0)
MONO ABS: 0.8 10*3/uL (ref 0.1–1.0)
MONOS PCT: 11 %
Neutro Abs: 5.1 10*3/uL (ref 1.7–7.7)
Neutrophils Relative %: 68 %
PLATELETS: 304 10*3/uL (ref 150–400)
RBC: 4.33 MIL/uL (ref 3.87–5.11)
RDW: 12.6 % (ref 11.5–15.5)
WBC: 7.6 10*3/uL (ref 4.0–10.5)

## 2015-11-25 LAB — BASIC METABOLIC PANEL
Anion gap: 10 (ref 5–15)
BUN: 9 mg/dL (ref 6–20)
CALCIUM: 9.2 mg/dL (ref 8.9–10.3)
CO2: 28 mmol/L (ref 22–32)
CREATININE: 0.67 mg/dL (ref 0.44–1.00)
Chloride: 97 mmol/L — ABNORMAL LOW (ref 101–111)
GFR calc Af Amer: >60 mL/min (ref >60)
GLUCOSE: 118 mg/dL — AB (ref 65–99)
Potassium: 4.4 mmol/L (ref 3.5–5.1)
Sodium: 135 mmol/L (ref 135–145)

## 2015-11-25 LAB — LACTIC ACID, PLASMA
Lactic Acid, Venous: 1.4 mmol/L (ref 0.5–2.0)
Lactic Acid, Venous: 3 mmol/L (ref 0.5–2.0)
Lactic Acid, Venous: 3.4 mmol/L (ref 0.5–2.0)

## 2015-11-25 LAB — BRAIN NATRIURETIC PEPTIDE: B NATRIURETIC PEPTIDE 5: 16.7 pg/mL (ref 0.0–100.0)

## 2015-11-25 LAB — PHOSPHORUS: Phosphorus: 3.4 mg/dL (ref 2.5–4.6)

## 2015-11-25 LAB — MAGNESIUM: Magnesium: 2.4 mg/dL (ref 1.7–2.4)

## 2015-11-25 LAB — PROCALCITONIN: Procalcitonin: <0.1 ng/mL

## 2015-11-25 LAB — I-STAT TROPONIN, ED: Troponin i, poc: 0 ng/mL (ref 0.00–0.08)

## 2015-11-25 LAB — MRSA PCR SCREENING: MRSA by PCR: NEGATIVE

## 2015-11-25 MED ORDER — IPRATROPIUM BROMIDE 0.02 % IN SOLN
0.5000 mg | Freq: Once | RESPIRATORY_TRACT | Status: AC
  Administered 2015-11-25: 0.5 mg via RESPIRATORY_TRACT
  Filled 2015-11-25: qty 2.5

## 2015-11-25 MED ORDER — METHYLPREDNISOLONE SODIUM SUCC 125 MG IJ SOLR
125.0000 mg | Freq: Once | INTRAMUSCULAR | Status: AC
  Administered 2015-11-25: 125 mg via INTRAVENOUS
  Filled 2015-11-25: qty 2

## 2015-11-25 MED ORDER — ENOXAPARIN SODIUM 40 MG/0.4ML ~~LOC~~ SOLN
40.0000 mg | SUBCUTANEOUS | Status: DC
  Administered 2015-11-25 – 2015-11-30 (×6): 40 mg via SUBCUTANEOUS
  Filled 2015-11-25 (×6): qty 0.4

## 2015-11-25 MED ORDER — ASPIRIN EC 81 MG PO TBEC
81.0000 mg | DELAYED_RELEASE_TABLET | ORAL | Status: DC
  Administered 2015-11-27 – 2015-12-01 (×3): 81 mg via ORAL
  Filled 2015-11-25 (×5): qty 1

## 2015-11-25 MED ORDER — MAGNESIUM SULFATE 2 GM/50ML IV SOLN
2.0000 g | Freq: Once | INTRAVENOUS | Status: AC
  Administered 2015-11-25: 2 g via INTRAVENOUS
  Filled 2015-11-25: qty 50

## 2015-11-25 MED ORDER — SODIUM CHLORIDE 0.9% FLUSH: 3.0000 mL | INTRAVENOUS | Status: DC | PRN

## 2015-11-25 MED ORDER — SODIUM CHLORIDE 0.9 % IV SOLN: 250.0000 mL | INTRAVENOUS | Status: DC | PRN

## 2015-11-25 MED ORDER — SODIUM CHLORIDE 0.9 % IV BOLUS (SEPSIS)
1000.0000 mL | Freq: Once | INTRAVENOUS | Status: AC
  Administered 2015-11-25: 1000 mL via INTRAVENOUS

## 2015-11-25 MED ORDER — IPRATROPIUM-ALBUTEROL 0.5-2.5 (3) MG/3ML IN SOLN
3.0000 mL | RESPIRATORY_TRACT | Status: DC | PRN
  Administered 2015-11-25: 3 mL via RESPIRATORY_TRACT
  Filled 2015-11-25: qty 3

## 2015-11-25 MED ORDER — SODIUM CHLORIDE 0.9 % IV SOLN
250.0000 mL | INTRAVENOUS | Status: DC
  Administered 2015-11-25: 250 mL via INTRAVENOUS

## 2015-11-25 MED ORDER — ONDANSETRON HCL 4 MG PO TABS: 4.0000 mg | ORAL_TABLET | Freq: Four times a day (QID) | ORAL | Status: DC | PRN

## 2015-11-25 MED ORDER — ALBUTEROL (5 MG/ML) CONTINUOUS INHALATION SOLN
10.0000 mg/h | INHALATION_SOLUTION | RESPIRATORY_TRACT | Status: AC
  Administered 2015-11-25: 10 mg/h via RESPIRATORY_TRACT
  Filled 2015-11-25: qty 20

## 2015-11-25 MED ORDER — IPRATROPIUM-ALBUTEROL 0.5-2.5 (3) MG/3ML IN SOLN
3.0000 mL | RESPIRATORY_TRACT | Status: DC
  Administered 2015-11-25 – 2015-11-26 (×4): 3 mL via RESPIRATORY_TRACT
  Filled 2015-11-25 (×4): qty 3

## 2015-11-25 MED ORDER — METHYLPREDNISOLONE SODIUM SUCC 125 MG IJ SOLR
60.0000 mg | Freq: Four times a day (QID) | INTRAMUSCULAR | Status: DC
  Administered 2015-11-25 – 2015-11-30 (×18): 60 mg via INTRAVENOUS
  Filled 2015-11-25 (×19): qty 2

## 2015-11-25 MED ORDER — SODIUM CHLORIDE 0.9% FLUSH
3.0000 mL | Freq: Two times a day (BID) | INTRAVENOUS | Status: DC
  Administered 2015-11-26 – 2015-12-02 (×11): 3 mL via INTRAVENOUS

## 2015-11-25 MED ORDER — GUAIFENESIN ER 600 MG PO TB12
1200.0000 mg | ORAL_TABLET | Freq: Two times a day (BID) | ORAL | Status: DC
  Administered 2015-11-25 – 2015-12-02 (×14): 1200 mg via ORAL
  Filled 2015-11-25 (×16): qty 2

## 2015-11-25 MED ORDER — LORAZEPAM 2 MG/ML IJ SOLN
1.0000 mg | Freq: Once | INTRAMUSCULAR | Status: AC
Start: 2015-11-25 — End: 2015-11-25
  Administered 2015-11-25: 1 mg via INTRAVENOUS
  Filled 2015-11-25: qty 1

## 2015-11-25 MED ORDER — LORAZEPAM 2 MG/ML IJ SOLN
0.5000 mg | Freq: Once | INTRAMUSCULAR | Status: AC
  Administered 2015-11-25: 0.5 mg via INTRAVENOUS
  Filled 2015-11-25: qty 1

## 2015-11-25 MED ORDER — OXYCODONE HCL 5 MG PO TABS: 5.0000 mg | ORAL_TABLET | ORAL | Status: DC | PRN

## 2015-11-25 MED ORDER — SODIUM CHLORIDE 0.9% FLUSH
3.0000 mL | Freq: Two times a day (BID) | INTRAVENOUS | Status: DC
  Administered 2015-11-25 – 2015-11-26 (×2): 3 mL via INTRAVENOUS

## 2015-11-25 MED ORDER — ALBUTEROL (5 MG/ML) CONTINUOUS INHALATION SOLN
10.0000 mg/h | INHALATION_SOLUTION | RESPIRATORY_TRACT | Status: AC
  Administered 2015-11-25: 10 mg/h via RESPIRATORY_TRACT

## 2015-11-25 MED ORDER — LISINOPRIL 20 MG PO TABS
20.0000 mg | ORAL_TABLET | Freq: Every day | ORAL | Status: DC
  Administered 2015-11-26 – 2015-11-29 (×4): 20 mg via ORAL
  Filled 2015-11-25 (×4): qty 1

## 2015-11-25 MED ORDER — FUROSEMIDE 20 MG PO TABS
20.0000 mg | ORAL_TABLET | ORAL | Status: DC
  Administered 2015-11-26: 20 mg via ORAL
  Filled 2015-11-25: qty 1

## 2015-11-25 MED ORDER — ONDANSETRON HCL 4 MG/2ML IJ SOLN: 4.0000 mg | Freq: Four times a day (QID) | INTRAMUSCULAR | Status: DC | PRN

## 2015-11-25 MED ORDER — LEVOFLOXACIN IN D5W 750 MG/150ML IV SOLN
750.0000 mg | INTRAVENOUS | Status: DC
  Administered 2015-11-25: 750 mg via INTRAVENOUS
  Filled 2015-11-25 (×2): qty 150

## 2015-11-25 MED ORDER — ACETAMINOPHEN 650 MG RE SUPP: 650.0000 mg | Freq: Four times a day (QID) | RECTAL | Status: DC | PRN

## 2015-11-25 MED ORDER — ACETAMINOPHEN 325 MG PO TABS
650.0000 mg | ORAL_TABLET | Freq: Four times a day (QID) | ORAL | Status: DC | PRN
  Administered 2015-11-26 – 2015-11-28 (×3): 650 mg via ORAL
  Filled 2015-11-25 (×3): qty 2

## 2015-11-25 MED ORDER — ALPRAZOLAM 0.5 MG PO TABS
0.5000 mg | ORAL_TABLET | Freq: Two times a day (BID) | ORAL | Status: DC | PRN
  Administered 2015-11-26 – 2015-12-02 (×14): 0.5 mg via ORAL
  Filled 2015-11-25 (×14): qty 1

## 2015-11-25 NOTE — ED Notes (Signed)
Pt here for SOB after taking inhalers. sts x 1 week and saw pulmonologist, given  prednisone and not better. Took 4 asa PTA. Pt on home o2 with 2 L. sts some chest pain and anxiety. ST on the monitor.

## 2015-11-25 NOTE — Progress Notes (Signed)
CRITICAL VALUE ALERT  Critical value received:  Lactic Acid 3.4  Date of notification:  11/25/2015  Time of notification:  2230  Critical value read back:Yes.    Nurse who received alert:  Eliane DecreeJalesa Candance Bohlman, RN  MD notified (1st page):  Maren ReamerKaren Kirby, NP  Time of first page:  2239  MD notified (2nd page):  Time of second page:   Responding MD:  Maren ReamerKaren Kirby, NP  Time MD responded:  2245

## 2015-11-25 NOTE — Progress Notes (Signed)
CRITICAL VALUE ALERT  Critical value received:  Lactic Acid of 3.0  Date of notification:  11/22/2015  Time of notification:  2005  Critical value read back:Yes.    Nurse who received alert:  Haze RushingSamantha Cooper,RN & Lisette GrinderJalesa Cavon Nicolls,RN  MD notified (1st page): Devonne DoughtyKaren Kirby,NP  Time of first page:  2020  MD notified (2nd page):  Time of second page:  Responding ZO:XWRUED:Karen Kirby,NP  Time MD responded:  2021  New order for 1L bolus of NS; continuous fluids at 1700ml/hr as well as lactic acids. Will continue to monitor the pt. Sanda LingerMilam, Koleen Celia R, RN

## 2015-11-25 NOTE — H&P (Signed)
[facility] History and Physical  [name] WUJ:[id number] DOB: Sep 28, 1945 DOA: 11/25/2015  Referring physician: Cammy CopaAbigail - PA [facility] PCP: Eartha InchBADGER,MICHAEL C, MD   Chief Complaint: SOB  HPI: Kaitlyn MoralesFrances L Ibarra is a 70 y.o. female  Patient reports multi week history of shortness of breath. Constant. Getting worse. Patient reports some relief with her home Symbicort. She is on 2 L nasal cannula at home but has not increased her home O2. Patient states she went to her pulmonologist on 11/22/2015 at which time she was given a steroid shot but was not continued on oral steroids. Per review of chart patient was supposed to have started oral steroids but for some reason this was not done. Patient reports that this morning she was unable to tolerate the shortness of breath any longer [facility]. Denies any orthopnea, lower extremity swelling, chest pain, nausea, vomiting, fevers. Denies any recent URI or flulike symptoms. She does have cough which is occasionally productive.   Review of Systems:  Constitutional:  No weight loss, night sweats, Fevers, chills HEENT:  No headaches, Difficulty swallowing,Tooth/dental problems,Sore throat, Cardio-vascular:  No chest pain, Orthopnea, PND, swelling in lower extremities, anasarca, dizziness, palpitations  GI:  No heartburn, indigestion, abdominal pain, nausea, vomiting, diarrhea, change in bowel habits, loss of appetite  Resp: Per HPI Skin:  no rash or lesions.  GU:  no dysuria, change in color of urine, no urgency or frequency. No flank pain.  Musculoskeletal:   No joint pain or swelling. No decreased range of motion. No back pain.  Psych:  No change in mood or affect. No depression or anxiety. No memory loss.  Neuro:  No change in sensation, unilateral strength, or cognitive abilities  All other systems were reviewed and are negative.  Past Medical History  Diagnosis Date  . Chronic airway obstruction, not elsewhere  classified   . Hypertension   . Ventilator dependent (HCC) 2004; 2008    "for mucous plugs"  . Pneumonia   . H/O chronic bronchitis   . Shortness of breath 10/08/11    "all the time last 1 1/2 wk"   Past Surgical History  Procedure Laterality Date  . Oophorectomy  1986  . Vaginal hysterectomy  1978    partial  . Dilation and curettage of uterus  1970's   Social History:  reports that she quit smoking about 5 months ago. Her smoking use included Cigarettes. She has a 20 pack-year smoking history. She quit smokeless tobacco use about 8 months ago. She reports that she drinks about 2.4 oz of alcohol per week. She reports that she does not use illicit drugs.  No Known Allergies  Family History  Problem Relation Age of Onset  . Liver cancer Mother   . Emphysema Father      Prior to Admission medications   Medication Sig Start Date End Date Taking? Authorizing Provider  ALPRAZolam (XANAX) 0.5 MG tablet TAKE 1 TABLET THREE TIMES DAILY AS NEEDED FOR ANXIETY  OR  SLEEP 06/24/15  Yes Waymon Budgelinton D Young, MD  aspirin 81 MG tablet Take 81 mg by mouth every other day. Take one tablet every other day   Yes Historical Provider, MD  budesonide-formoterol [facility](SYMBICORT) 160-4.5 MCG/ACT inhaler 2 puffs then rinse mouth, twice daily 06/10/15  Yes Waymon Budgelinton D Young, MD  furosemide (LASIX) 20 MG tablet Take 20 mg by mouth every other day.   Yes Historical Provider, MD  Ipratropium-Albuterol (COMBIVENT) 20-100 MCG/ACT AERS respimat Inhale 1 puff  into the lungs every 6 (six) hours. 06/20/15  Yes Waymon Budge, MD  ipratropium-albuterol (DUONEB) 0.5-2.5 (3) MG/3ML SOLN USE ONE VIAL VIA NEBULIZER 2 TIMES DAILY 03/19/15  Yes Waymon Budge, MD  lisinopril (PRINIVIL,ZESTRIL) 20 MG tablet Take 20 mg by mouth daily.   Yes Historical Provider, MD  Multiple Vitamins-Minerals (PRESERVISION AREDS 2 PO) Take 2 capsules by mouth daily. Reported on 11/22/2015   Yes Historical Provider, MD  POTASSIUM GLUCONATE PO Take 1 tablet by  mouth daily. Patient gets this medication over the counter -dose unknown   Yes Historical Provider, MD  guaiFENesin (MUCINEX) 600 MG 12 hr tablet Take 1 tablet (600 mg total) by mouth 2 (two) times daily. Patient not taking: Reported on 11/22/2015 03/15/15   Beather Arbour, MD  predniSONE (DELTASONE) 10 MG tablet 4 X 2 DAYS, 3 X 2 DAYS, 2 X 2 DAYS, 1 X 2 DAYS 11/22/15   Waymon Budge, MD   Physical Exam: Filed Vitals:   11/25/15 1230 11/25/15 1330 11/25/15 1331 11/25/15 1355  BP: 104/56 100/52    Pulse: 119  122 118  Resp: SpO2: 96%  96% 99%    Wt Readings from Last 3 Encounters:  11/22/15 56.427 kg (124 lb 6.4 oz)  08/20/15 54.795 kg (120 lb 12.8 oz)  05/20/15 52.98 kg (116 lb 12.8 oz)    General: Appears somewhat anxious, lying in bed with BiPAP. Eyes:  PERRL, EOMI, normal lids, iris ENT:  grossly normal hearing, lips & tongue Neck:  no LAD, masses or thyromegaly Cardiovascular: Difficult to appreciate due to patient's current respiratory failure on BiPAP. Regular rate and rhythm though tachycardic, no appreciable murmurs, no lower extremity edema. Respiratory: Very poor aeration throughout all lungs posteriorly. On BiPAP. Wheezing throughout. Abdomen:  soft, ntnd Skin:  no rash or induration seen on limited exam Musculoskeletal:  grossly normal tone BUE/BLE Psychiatric:  grossly normal mood and affect, speech fluent and appropriate Neurologic:  CN 2-12 grossly intact, moves all extremities in coordinated fashion.          Labs on Admission:  Basic Metabolic Panel:  Recent Labs Lab 11/25/15 1036  NA 135  K 4.4  CL 97*  CO2 28  GLUCOSE 118*  BUN 9  CREATININE 0.67  CALCIUM 9.2   Liver Function Tests: No results for input(s): AST, ALT, ALKPHOS, BILITOT, PROT, ALBUMIN in the last 168 hours. No results for input(s): LIPASE, AMYLASE in the last 168 hours. No results for input(s): AMMONIA in the last 168 hours. CBC:  Recent Labs Lab 11/25/15 1036  WBC 7.6   NEUTROABS 5.1  HGB 12.4  HCT 38.1  MCV 88.0  PLT 304   Cardiac Enzymes: No results for input(s): CKTOTAL, CKMB, CKMBINDEX, TROPONINI in the last 168 hours.  BNP (last 3 results)  Recent Labs  03/13/15 0846 11/25/15 1036  BNP 23.0 16.7    ProBNP (last 3 results)  Recent Labs  12/12/14 1006  PROBNP 33.0     CREATININE: 0.67 (11/25/15 1036) Estimated creatinine clearance - 49.4 mL/min  CBG: No results for input(s): GLUCAP in the last 168 hours.  Radiological Exams on Admission: Dg Chest Port 1 View  11/25/2015  CLINICAL DATA:  Shortness of Breath EXAM: PORTABLE CHEST 1 VIEW COMPARISON:  08/20/2015 FINDINGS: Cardiac shadow is within normal limits. The lungs are well aerated bilaterally. Old rib fractures are noted on the left. No acute abnormality is seen. IMPRESSION: No acute abnormality noted. Electronically Signed  By: Alcide Clever M.D.   On: 11/25/2015 10:43      Assessment/Plan Active Problems:   Essential hypertension   Acute respiratory failure (HCC)   Acute on chronic respiratory failure (HCC)   Anxiety   Dependent edema   Tachycardia  Acute respiratory failure: Likely secondary to COPD exacerbation. No evidence of pneumonia, or acute decompensated CHF. On BiPAP with sats in the low 90s currently. Patient on 2 L nasal cannula home. Patient with long-standing history of COPD and respiratory failure. Followed regularly by Dr. Maple Hudson of pulmonology. Patient received a single steroid shot on 11/22/2014 but did not take her follow-up steroids. For SOB complaint EKG and Trop taken showing - EKG w/o ACS and Trop neg. Pts tachycardia likely from ARF as above - Stepdown - ABG stat (this ABG will be performed after being on BiPAP for approximately 1 hour and after receiving multiple nebulizer treatments and steroids.) - Lactic acid, pro-calcitonin - Solu-Medrol 60 mg every 6 hours - Duo nebs every 4 hours, every 2 hours when necessary - Levaquin - Mucinex - Trial  off BiPAP this ABG reassuring, otherwise pulmonology consult - Tele  HTN: normotensive - continue lisinopril   Anxiety: - continue Xanax  Dependent edema: - continue lasix QOD  Code Status: FULL  DVT Prophylaxis: Lovenox Family Communication: sona nd husband Disposition Plan: Pending Improvement    Koi Zangara J, MD Family Medicine [facility] [email or web address] Password [email or web address]

## 2015-11-25 NOTE — Progress Notes (Signed)
Pharmacy Antibiotic Note  [name] is a 70 y.o. female admitted on 11/25/2015 with COPD Exacerbation.  Pharmacy has been consulted for levofloxacin dosing.  Plan: Levofloxacin 750 mg IV q24h     No data recorded.   Recent Labs Lab 11/25/15 1036  WBC 7.6  CREATININE 0.67    Estimated Creatinine Clearance: 49.4 mL/min (by C-G formula based on Cr of 0.67).    No Known Allergies  Antimicrobials this admission: Levofloxacin 3/6>>  Kaitlyn BlissMichael Vang Ibarra, PharmD, BCPS, [facility] Clinical Pharmacist Pager [phone] 11/25/2015 3:13 PM

## 2015-11-25 NOTE — ED Notes (Signed)
Report attempted 

## 2015-11-25 NOTE — ED Provider Notes (Signed)
CSN: [id number]     Arrival date & time 11/25/15  4098 History   First MD Initiated Contact with Patient 11/25/15 0957     Chief Complaint  Patient presents with  . Shortness of Breath     (Consider location/radiation/quality/duration/timing/severity/associated sxs/prior Treatment) HPI This is a 70 year old female with a history of COPD, history of previous intubation for "mucous plugs" who presents emergency Department with chief complaint of shortness of breath. Patient states that over the past week she has had worsening difficulty breathing. She saw her pulmonologist about one week ago. She has a chest x-ray that was negative for pneumonia. She was treated with a prednisone/ she has had worsening SOB and difficulty breathing. She Denies fevers, chills, sputum production.    Past Medical History  Diagnosis Date  . Chronic airway obstruction, not elsewhere classified   . Hypertension   . Ventilator dependent (HCC) 2004; 2008    "for mucous plugs"  . Pneumonia   . H/O chronic bronchitis   . Shortness of breath 10/08/11    "all the time last 1 1/2 wk"   Past Surgical History  Procedure Laterality Date  . Oophorectomy  1986  . Vaginal hysterectomy  1978    partial  . Dilation and curettage of uterus  1970's   Family History  Problem Relation Age of Onset  . Liver cancer Mother   . Emphysema Father    Social History  Substance Use Topics  . Smoking status: Former Smoker -- 0.50 packs/day for 40 years    Types: Cigarettes    Quit date: 06/22/2015  . Smokeless tobacco: Former Neurosurgeon    Quit date: 02/26/2015     Comment: 4-5 ciggs a day - QUIT Oct 2016  . Alcohol Use: 2.4 oz/week    4 Cans of beer per week   OB History    No data available     Review of Systems  Ten systems reviewed and are negative for acute change, except as noted in the HPI.    Allergies  Review of patient's allergies indicates no known allergies.  Home Medications   Prior to Admission  medications   Medication Sig Start Date End Date Taking? Authorizing Provider  ALPRAZolam (XANAX) 0.5 MG tablet TAKE 1 TABLET THREE TIMES DAILY AS NEEDED FOR ANXIETY  OR  SLEEP 06/24/15  Yes Waymon Budge, MD  aspirin 81 MG tablet Take 81 mg by mouth every other day. Take one tablet every other day   Yes Historical Provider, MD  budesonide-formoterol [facility]) 160-4.5 MCG/ACT inhaler 2 puffs then rinse mouth, twice daily 06/10/15  Yes Waymon Budge, MD  furosemide (LASIX) 20 MG tablet Take 20 mg by mouth every other day.   Yes Historical Provider, MD  Ipratropium-Albuterol (COMBIVENT) 20-100 MCG/ACT AERS respimat Inhale 1 puff into the lungs every 6 (six) hours. 06/20/15  Yes Waymon Budge, MD  ipratropium-albuterol (DUONEB) 0.5-2.5 (3) MG/3ML SOLN USE ONE VIAL VIA NEBULIZER 2 TIMES DAILY 03/19/15  Yes Waymon Budge, MD  lisinopril (PRINIVIL,ZESTRIL) 20 MG tablet Take 20 mg by mouth daily.   Yes Historical Provider, MD  Multiple Vitamins-Minerals (PRESERVISION AREDS 2 PO) Take 2 capsules by mouth daily. Reported on 11/22/2015   Yes Historical Provider, MD  POTASSIUM GLUCONATE PO Take 1 tablet by mouth daily. Patient gets this medication over the counter -dose unknown   Yes Historical Provider, MD  guaiFENesin (MUCINEX) 600 MG 12 hr tablet Take 1 tablet (600 mg total) by mouth 2 (  two) times daily. Patient not taking: Reported on 11/22/2015 03/15/15   Beather Arbourushil Patel V, MD  predniSONE (DELTASONE) 10 MG tablet 4 X 2 DAYS, 3 X 2 DAYS, 2 X 2 DAYS, 1 X 2 DAYS 11/22/15   Waymon Budgelinton D Young, MD   Pulse 105  Resp 26  SpO2 100% Physical Exam  Constitutional: She is oriented to person, place, and time. She appears well-developed and well-nourished. She appears distressed.  Patient arrives in respiratory distress on 10 L NRB. She is in tripod position with supraclavicular retractions, poor air movement.  HENT:  Head: Normocephalic and atraumatic.  Eyes: Conjunctivae and EOM are normal. Pupils are equal, round,  and reactive to light.  Neck: Normal range of motion. No JVD present.  Cardiovascular:  No murmur heard. Tachycardic  Pulmonary/Chest:  Minimal air movement, minimal wheezes, shallow effort with prolonged expiratory phase. Belly breathing. Purse lips  Abdominal: Soft. She exhibits no distension and no mass. There is no tenderness. There is no guarding.  Musculoskeletal: Normal range of motion.  Neurological: She is alert and oriented to person, place, and time.  Skin: Skin is warm.  Psychiatric:  3. Anxious  Nursing note and vitals reviewed.   ED Course  .Critical Care Performed by: Arthor CaptainHARRIS, Pinky Ravan Authorized by: Arthor CaptainHARRIS, Jalonda Antigua Total critical care time: 35 minutes Critical care time was exclusive of separately billable procedures and treating other patients. Critical care was necessary to treat or prevent imminent or life-threatening deterioration of the following conditions: respiratory failure. Critical care was time spent personally by me on the following activities: discussions with consultants, pulse oximetry, ordering and review of laboratory studies, re-evaluation of patient's condition, ordering and review of radiographic studies, examination of patient, development of treatment plan with patient or surrogate, review of old charts and ordering and performing treatments and interventions.   (including critical care time) Labs Review Labs Reviewed  BASIC METABOLIC PANEL - Abnormal; Notable for the following:    Chloride 97 (*)    Glucose, Bld 118 (*)    All other components within normal limits  CBC WITH DIFFERENTIAL/PLATELET  BRAIN NATRIURETIC PEPTIDE  I-STAT TROPOININ, ED    Imaging Review No results found. I have personally reviewed and evaluated these images and lab results as part of my medical decision-making.   EKG Interpretation None      MDM   Final diagnoses:  COPD exacerbation (HCC)  Respiratory distress    Patient arrives in respiratory  distress. She's had 4 nebs treatments with albuterol and Atrovent prior to arrival in the emergency department. Initial workup. Initial lab workup shows no leukocytosis. She has mild hyperglycemia and hypochloremia. BNP is negative, negative. I-STAT troponin, EKG and concerning for ischemia without significant changes. Chest x-ray shows no acute abnormality. Patient has had 1 hour-long neb treatment, 2 g IV magnesium, 125 mg of IV Solu-Medrol with little improvement. On reassessment, is still very little air movement, although the patient is now longer in tripod position. I seen the patient in shared visit with attending physician. Will move to BiPAP. Every ordered Atrovent and another hour-long nebulizer treatment.  Patient improved on Bipap and placed back on 3 L. Her ABG shows compensated chronic hypercarbia.  Dr Evelena Peatmerrel will admit the patient to stepdown.   Arthor Captainbigail Garielle Mroz, PA-C 11/25/15 1553  Alvira MondayErin Schlossman, MD 11/25/15 2151

## 2015-11-26 LAB — COMPREHENSIVE METABOLIC PANEL
ALBUMIN: 3.4 g/dL — AB (ref 3.5–5.0)
ALT: 17 U/L (ref 14–54)
ANION GAP: 12 (ref 5–15)
AST: 25 U/L (ref 15–41)
Alkaline Phosphatase: 45 U/L (ref 38–126)
BILIRUBIN TOTAL: 0.4 mg/dL (ref 0.3–1.2)
BUN: 10 mg/dL (ref 6–20)
CHLORIDE: 103 mmol/L (ref 101–111)
CO2: 22 mmol/L (ref 22–32)
Calcium: 8.5 mg/dL — ABNORMAL LOW (ref 8.9–10.3)
Creatinine, Ser: 0.65 mg/dL (ref 0.44–1.00)
GFR calc Af Amer: >60 mL/min (ref >60)
Glucose, Bld: 140 mg/dL — ABNORMAL HIGH (ref 65–99)
POTASSIUM: 4.8 mmol/L (ref 3.5–5.1)
Sodium: 137 mmol/L (ref 135–145)
Total Protein: 5.7 g/dL — ABNORMAL LOW (ref 6.5–8.1)

## 2015-11-26 LAB — LACTIC ACID, PLASMA: Lactic Acid, Venous: 2.6 mmol/L (ref 0.5–2.0)

## 2015-11-26 LAB — CBC
HEMATOCRIT: 34.2 % — AB (ref 36.0–46.0)
Hemoglobin: 10.9 g/dL — ABNORMAL LOW (ref 12.0–15.0)
MCH: 28.1 pg (ref 26.0–34.0)
MCHC: 31.9 g/dL (ref 30.0–36.0)
MCV: 88.1 fL (ref 78.0–100.0)
PLATELETS: 273 10*3/uL (ref 150–400)
RBC: 3.88 MIL/uL (ref 3.87–5.11)
RDW: 12.8 % (ref 11.5–15.5)
WBC: 6.2 10*3/uL (ref 4.0–10.5)

## 2015-11-26 MED ORDER — TIOTROPIUM BROMIDE MONOHYDRATE 18 MCG IN CAPS
18.0000 ug | ORAL_CAPSULE | Freq: Every day | RESPIRATORY_TRACT | Status: DC
  Filled 2015-11-26: qty 5

## 2015-11-26 MED ORDER — SODIUM CHLORIDE 0.9 % IV SOLN: INTRAVENOUS | Status: DC

## 2015-11-26 MED ORDER — SODIUM CHLORIDE 0.9 % IV BOLUS (SEPSIS)
250.0000 mL | Freq: Once | INTRAVENOUS | Status: AC
  Administered 2015-11-26: 250 mL via INTRAVENOUS

## 2015-11-26 MED ORDER — GUAIFENESIN-DM 100-10 MG/5ML PO SYRP
5.0000 mL | ORAL_SOLUTION | ORAL | Status: DC | PRN
  Administered 2015-11-26 – 2015-11-30 (×5): 5 mL via ORAL
  Filled 2015-11-26 (×5): qty 5

## 2015-11-26 MED ORDER — ALBUTEROL SULFATE (2.5 MG/3ML) 0.083% IN NEBU
2.5000 mg | INHALATION_SOLUTION | RESPIRATORY_TRACT | Status: DC | PRN
  Administered 2015-11-28: 2.5 mg via RESPIRATORY_TRACT
  Filled 2015-11-26 (×2): qty 3

## 2015-11-26 MED ORDER — DOXYCYCLINE HYCLATE 100 MG IV SOLR
100.0000 mg | Freq: Two times a day (BID) | INTRAVENOUS | Status: DC
  Administered 2015-11-26 – 2015-11-29 (×8): 100 mg via INTRAVENOUS
  Filled 2015-11-26 (×10): qty 100

## 2015-11-26 MED ORDER — ALBUTEROL SULFATE (2.5 MG/3ML) 0.083% IN NEBU
2.5000 mg | INHALATION_SOLUTION | Freq: Four times a day (QID) | RESPIRATORY_TRACT | Status: DC
  Administered 2015-11-26 – 2015-11-27 (×7): 2.5 mg via RESPIRATORY_TRACT
  Filled 2015-11-26 (×7): qty 3

## 2015-11-26 MED ORDER — BENZONATATE 100 MG PO CAPS
200.0000 mg | ORAL_CAPSULE | Freq: Three times a day (TID) | ORAL | Status: DC | PRN
  Administered 2015-11-26 – 2015-11-27 (×3): 200 mg via ORAL
  Filled 2015-11-26 (×4): qty 2

## 2015-11-26 MED ORDER — ENSURE ENLIVE PO LIQD
237.0000 mL | Freq: Two times a day (BID) | ORAL | Status: DC
  Administered 2015-11-26: 237 mL via ORAL

## 2015-11-26 NOTE — Progress Notes (Deleted)
ANTIBIOTIC CONSULT NOTE   Pharmacy Consult for Levaquin Indication: COPD exac  No Known Allergies  Patient Measurements: Height: 5\' 1"  (154.9 cm) Weight: 128 lb 9.6 oz (58.333 kg) IBW/kg (Calculated) : 47.8 Adjusted Body Weight:    Vital Signs: Temp: 98.8 F (37.1 C) (03/07 1112) Temp Source: Oral (03/07 1112) BP: 124/61 mmHg (03/07 1112) Pulse Rate: 100 (03/07 0926) Intake/Output from previous day: 03/06 0701 - 03/07 0700 In: 720 [P.O.:720] Out: 750 [Urine:750] Intake/Output from this shift: Total I/O In: 625 [P.O.:240; I.V.:135; IV Piggyback:250] Out: 1050 [Urine:1050]  Labs:  Recent Labs  11/25/15 1036 11/26/15 0051  WBC 7.6 6.2  HGB 12.4 10.9*  PLT 304 273  CREATININE 0.67 0.65   Estimated Creatinine Clearance: 53.7 mL/min (by C-G formula based on Cr of 0.65). No results for input(s): VANCOTROUGH, VANCOPEAK, VANCORANDOM, GENTTROUGH, GENTPEAK, GENTRANDOM, TOBRATROUGH, TOBRAPEAK, TOBRARND, AMIKACINPEAK, AMIKACINTROU, AMIKACIN in the last 72 hours.   Microbiology:   Medical History: Past Medical History  Diagnosis Date  . Chronic airway obstruction, not elsewhere classified   . Hypertension   . Ventilator dependent (HCC) 2004; 2008    "for mucous plugs"  . Pneumonia   . H/O chronic bronchitis   . Shortness of breath 10/08/11    "all the time last 1 1/2 wk"   Assessment: ID: abx for COPD exacerbation start 3/6. Tmax 99. WBC 6.2. Renal stable.  Goal of Therapy:  Eradication of infection  Plan:  Levofloxacin 750 mg IV q24h for CrCl>50 Pharmacy will sign off. Please reconsult for further dosing assitance.   Teri Diltz S. Merilynn Finlandobertson, PharmD, BCPS Clinical Staff Pharmacist Pager [phone]  Misty Stanleyobertson, Randal Goens Stillinger 11/26/2015,1:35 PM

## 2015-11-26 NOTE — Evaluation (Signed)
Occupational Therapy Evaluation Patient Details Name: MACAYLA EKDAHL MRN: [id number] DOB: 1946-04-03 Today's Date: 11/26/2015    History of Present Illness Pt is a 70 y/o F admitted for acute on chornic respiratory failure w/ hypoxia due to COPD exacerbation.  Pt's PMH includes anxiety.   Clinical Impression   Pt was independent in self care, struggled with housekeeping and errands. Pt supervised for mobility due to lines. Educated in pursed lip breathing and energy conservation and reinforced with written handout.  No further OT needs.   Follow Up Recommendations  No OT follow up    Equipment Recommendations  None recommended by OT    Recommendations for Other Services       Precautions / Restrictions Precautions Precaution Comments: watch O2 Restrictions Weight Bearing Restrictions: No      Mobility Bed Mobility Overal bed mobility: Independent             General bed mobility comments: No cues or physical assist needed  Transfers Overall transfer level: Needs assistance Equipment used: None Transfers: Sit to/from Stand Sit to Stand: Supervision         General transfer comment: supervision for lines, no physical assist needed    Balance Overall balance assessment: Needs assistance Sitting-balance support: No upper extremity supported;Feet supported Sitting balance-Leahy Scale: Good     Standing balance support: No upper extremity supported;During functional activity Standing balance-Leahy Scale: Good               High level balance activites: Backward walking;Turns;Sudden stops;Head turns;Other (comment);Direction changes (stepping over object) High Level Balance Comments: Min instability w/ stepping over object.            ADL Overall ADL's : At baseline                                       General ADL Comments: Pt educated in energy conservation. Ambulating to bathroom with supervision for managing IV line only.      Vision     Perception     Praxis      Pertinent Vitals/Pain Pain Assessment: No/denies pain     Hand Dominance Right   Extremity/Trunk Assessment Upper Extremity Assessment Upper Extremity Assessment: Overall WFL for tasks assessed   Lower Extremity Assessment Lower Extremity Assessment: Overall WFL for tasks assessed       Communication Communication Communication: No difficulties   Cognition Arousal/Alertness: Awake/alert Behavior During Therapy: WFL for tasks assessed/performed Overall Cognitive Status: Within Functional Limits for tasks assessed                     General Comments       Exercises      Shoulder Instructions      Home Living Family/patient expects to be discharged to:: Private residence Living Arrangements: Alone Available Help at Discharge: Family;Available PRN/intermittently Type of Home: House Home Access: Stairs to enter [employer] of Steps: 3 Entrance Stairs-Rails: Left;Right Home Layout: One level     Bathroom Shower/Tub: Producer, television/film/video: Standard     Home Equipment: Environmental consultant - 2 wheels;Shower seat - built in;Other (comment) (02)          Prior Functioning/Environment Level of Independence: Independent        Comments: pt struggles to bring in her groceries, vacuum floors    OT Diagnosis: Generalized weakness   OT Problem List:  OT Treatment/Interventions:      OT Goals(Current goals can be found in the care plan section) Acute Rehab OT Goals Patient Stated Goal: to improve her breathing  OT Frequency:     Barriers to D/C:            Co-evaluation              End of Session    Activity Tolerance: Patient limited by fatigue Patient left: in bed;with call bell/phone within reach;with family/visitor present (RT)   Time: [id number] OT Time Calculation (min): 23 min Charges:  OT General Charges $OT Visit: 1 Procedure OT Evaluation $OT Eval Moderate  Complexity: 1 Procedure OT Treatments $Self Care/Home Management : 8-22 mins G-Codes:    Evern BioMayberry, Bashir Marchetti Lynn 11/26/2015, 3:08 PM  [phone]

## 2015-11-26 NOTE — Progress Notes (Signed)
CRITICAL VALUE ALERT  Critical value received:  Lactic Acid 2.6  Date of notification:  03/077/2017  Time of notification:  0207  Critical value read back:Yes.    Nurse who received alert:  Lisette GrinderJalesa Liam Bossman,RN  MD notified (1st page):  Maren ReamerKaren Kirby, NP  Time of first page:  0300  MD notified (2nd page):  Time of second page:  Responding MD:  Maren ReamerKaren Kirby, NP  Time MD responded:  0330

## 2015-11-26 NOTE — Evaluation (Signed)
Physical Therapy Evaluation Patient Details Name: Kaitlyn MoralesFrances L Ibarra MRN: [id number] DOB: 03-11-1946 Today's Date: 11/26/2015   History of Present Illness  Pt is a 70 y/o F admitted for acute on chornic respiratory failure w/ hypoxia due to COPD exacerbation.  Pt's PMH includes anxiety.  Clinical Impression  Pt admitted with above diagnosis. Pt currently with functional limitations due to the deficits listed below (see PT Problem List). [name] currently requires min guard assist ambulating in hallway w/ min instability when stepping over object.  Anticipate that pt's independence will improve quickly as this was her first time ambulating in the hallway and pt was Ind PTA. Pt will benefit from skilled PT to increase their independence and safety with mobility to allow discharge to the venue listed below.      Follow Up Recommendations No PT follow up;Supervision - Intermittent    Equipment Recommendations  None recommended by PT    Recommendations for Other Services OT consult     Precautions / Restrictions Precautions Precaution Comments: watch O2 Restrictions Weight Bearing Restrictions: No      Mobility  Bed Mobility Overal bed mobility: Independent             General bed mobility comments: No cues or physical assist needed  Transfers Overall transfer level: Needs assistance Equipment used: None Transfers: Sit to/from Stand Sit to Stand: Min guard         General transfer comment: Min instability but no LOB.  Min guard assist for safety.    Ambulation/Gait Ambulation/Gait assistance: Min guard Ambulation Distance (Feet): 150 Feet Assistive device: None Gait Pattern/deviations: Decreased stride length;Step-through pattern   Gait velocity interpretation: Below normal speed for age/gender General Gait Details: 2 standing rest breaks due to dyspnea.  SpO2 remained 93% or above while ambulating.  Min instability w/ stepping over object in hallway but does not  require outside assist.  Stairs            Wheelchair Mobility    Modified Rankin (Stroke Patients Only)       Balance Overall balance assessment: Needs assistance Sitting-balance support: No upper extremity supported;Feet supported Sitting balance-Leahy Scale: Good     Standing balance support: No upper extremity supported;During functional activity Standing balance-Leahy Scale: Good               High level balance activites: Backward walking;Turns;Sudden stops;Head turns;Other (comment);Direction changes (stepping over object) High Level Balance Comments: Min instability w/ stepping over object.             Pertinent Vitals/Pain Pain Assessment: No/denies pain    Home Living Family/patient expects to be discharged to:: Private residence Living Arrangements: Alone Available Help at Discharge: Family;Available PRN/intermittently Type of Home: House Home Access: Stairs to enter Entrance Stairs-Rails: LawyerLeft;Right Entrance Stairs-Number of Steps: 3 Home Layout: One level Home Equipment: Environmental consultantWalker - 2 wheels      Prior Function Level of Independence: Independent               Hand Dominance        Extremity/Trunk Assessment   Upper Extremity Assessment: Defer to OT evaluation           Lower Extremity Assessment: Overall WFL for tasks assessed         Communication   Communication: No difficulties  Cognition Arousal/Alertness: Awake/alert Behavior During Therapy: WFL for tasks assessed/performed Overall Cognitive Status: Within Functional Limits for tasks assessed  General Comments      Exercises General Exercises - Lower Extremity Ankle Circles/Pumps: AROM;Both;10 reps;Seated      Assessment/Plan    PT Assessment Patient needs continued PT services  PT Diagnosis Difficulty walking   PT Problem List Decreased activity tolerance;Decreased balance;Cardiopulmonary status limiting activity  PT  Treatment Interventions DME instruction;Gait training;Stair training;Functional mobility training;Therapeutic activities;Therapeutic exercise;Balance training;Patient/family education   PT Goals (Current goals can be found in the Care Plan section) Acute Rehab PT Goals Patient Stated Goal: to improve her breathing PT Goal Formulation: With patient Time For Goal Achievement: 12/10/15 Potential to Achieve Goals: Good    Frequency Min 3X/week   Barriers to discharge Inaccessible home environment;Decreased caregiver support Lives alone and has steps to enter home    Co-evaluation               End of Session Equipment Utilized During Treatment: Gait belt;Oxygen Activity Tolerance: Patient tolerated treatment well;Patient limited by fatigue Patient left: in chair;with call bell/phone within reach Nurse Communication: Mobility status;Other (comment) (SpO2)         Time: 6295-2841 PT Time Calculation (min) (ACUTE ONLY): 25 min   Charges:   PT Evaluation $PT Eval Low Complexity: 1 Procedure PT Treatments $Gait Training: 8-22 mins   PT G Codes:       Encarnacion Chu PT, DPT  Pager: [phone] Phone: [phone] 11/26/2015, 2:21 PM

## 2015-11-26 NOTE — Progress Notes (Signed)
[facility] PROGRESS NOTE    Progress Note   [name] NWG:[id number] DOB: [birth year] DOA: 2015-12-16 PCP: [name], MD   Brief Narrative:   [name] is an 70 y.o. female past medical history of COPD not on oxygen at home we'll saw her primary pulmonary physician started her on steroids but she didn't continue them at home. They comes in for worsening shortness of breath.  Assessment/Plan:   Acute on chronic respiratory failure with hypoxia (HCC) due to COPD with exacerbation (HCC) I agree with IV steroids, add Spiriva epidural scheduled when necessary. I will also add IV antibiotics. Continue nasal cannula oxygen. 2 mild lactic acidosis is likely due to hypoxia. Her vitals are stable, hyperplastic less than 10. Continue IV fluid hydration. Hold Furosemide  Essential hypertension: Continue current home regimen no changes were made.  Anxiety: Continue Xanax.  SinusTachycardia Likely due to respiratory failure.  DVT Prophylaxis - Lovenox ordered.  Family Communication: none Disposition Plan: Home in 3-4 days Code Status:     Code Status Orders        Start     Ordered   2015-12-16 1438  Full code   Continuous     12/16/15 1443    Code Status History    Date Active Date Inactive Code Status Order ID Comments User Context   03/13/2015 12:39 PM 03/16/2015  3:28 PM Full Code [id number]  Marrian Salvage, MD Inpatient   10/08/2011  8:10 PM 10/12/2011  8:30 PM Full Code [id number]  Cecile Hearing, RN ED        IV Access:    Peripheral IV   Procedures and diagnostic studies:   Dg Chest Port 1 View  Dec 16, 2015  CLINICAL DATA:  Shortness of Breath EXAM: PORTABLE CHEST 1 VIEW COMPARISON:  08/20/2015 FINDINGS: Cardiac shadow is within normal limits. The lungs are well aerated bilaterally. Old rib fractures are noted on the left. No acute abnormality is seen. IMPRESSION: No acute abnormality noted. Electronically Signed   By: Alcide Clever M.D.    On: December 16, 2015 10:43     Medical Consultants:    None.  Anti-Infectives:   Anti-infectives    Start     Dose/Rate Route Frequency Ordered Stop   16-Dec-2015 1600  levofloxacin (LEVAQUIN) IVPB 750 mg     750 mg 100 mL/hr over 90 Minutes Intravenous Every 24 hours 2015/12/16 1514        Subjective:    [name] she relates her shortness of breath has not improved.  Objective:    Filed Vitals:   11/26/15 0329 11/26/15 0430 11/26/15 0730 11/26/15 0754  BP:  144/59 150/80   Pulse:  113 105   Temp:  97.8 F (36.6 C)  97.8 F (36.6 C)  TempSrc:  Oral  Oral  Resp:  30 22   Height:      Weight:  58.333 kg (128 lb 9.6 oz)    SpO2: 97% 96% 98%     Intake/Output Summary (Last 24 hours) at 11/26/15 0821 Last data filed at 11/26/15 0727  Gross per 24 hour  Intake    720 ml  Output   1250 ml  Net   -530 ml   Filed Weights   12-16-2015 1844 11/26/15 0430  Weight: 54.84 kg (120 lb 14.4 oz) 58.333 kg (128 lb 9.6 oz)    Exam: Gen:  NAD Cardiovascular:  RRR. Chest and lungs:   Good movement of wheezing bilaterally. Abdomen:  Abdomen soft,  NT/ND, + BS Extremities:  No edema   Data Reviewed:    Labs: Basic Metabolic Panel:  Recent Labs Lab 11/25/15 1036 11/25/15 1516 11/26/15 0051  NA 135  --  137  K 4.4  --  4.8  CL 97*  --  103  CO2 28  --  22  GLUCOSE 118*  --  140*  BUN 9  --  10  CREATININE 0.67  --  0.65  CALCIUM 9.2  --  8.5*  MG  --  2.4  --   PHOS  --  3.4  --    GFR Estimated Creatinine Clearance: 53.7 mL/min (by C-G formula based on Cr of 0.65). Liver Function Tests:  Recent Labs Lab 11/26/15 0051  AST 25  ALT 17  ALKPHOS 45  BILITOT 0.4  PROT 5.7*  ALBUMIN 3.4*   No results for input(s): LIPASE, AMYLASE in the last 168 hours. No results for input(s): AMMONIA in the last 168 hours. Coagulation profile No results for input(s): INR, PROTIME in the last 168 hours.  CBC:  Recent Labs Lab 11/25/15 1036 11/26/15 0051  WBC 7.6  6.2  NEUTROABS 5.1  --   HGB 12.4 10.9*  HCT 38.1 34.2*  MCV 88.0 88.1  PLT 304 273   Cardiac Enzymes: No results for input(s): CKTOTAL, CKMB, CKMBINDEX, TROPONINI in the last 168 hours. BNP (last 3 results)  Recent Labs  12/12/14 1006  PROBNP 33.0   CBG: No results for input(s): GLUCAP in the last 168 hours. D-Dimer: No results for input(s): DDIMER in the last 72 hours. Hgb A1c: No results for input(s): HGBA1C in the last 72 hours. Lipid Profile: No results for input(s): CHOL, HDL, LDLCALC, TRIG, CHOLHDL, LDLDIRECT in the last 72 hours. Thyroid function studies: No results for input(s): TSH, T4TOTAL, T3FREE, THYROIDAB in the last 72 hours.  Invalid input(s): FREET3 Anemia work up: No results for input(s): VITAMINB12, FOLATE, FERRITIN, TIBC, IRON, RETICCTPCT in the last 72 hours. Sepsis Labs:  Recent Labs Lab 11/25/15 1036 11/25/15 1516 11/25/15 1521 11/25/15 1856 11/25/15 2122 11/26/15 0051  PROCALCITON  --  <0.10  --   --   --   --   WBC 7.6  --   --   --   --  6.2  LATICACIDVEN  --   --  1.4 3.0* 3.4* 2.6*   Microbiology Recent Results (from the past 240 hour(s))  MRSA PCR Screening     Status: None   Collection Time: 11/25/15  6:47 PM  Result Value Ref Range Status   MRSA by PCR NEGATIVE NEGATIVE Final    Comment:        The GeneXpert MRSA Assay (FDA approved for NASAL specimens only), is one component of a comprehensive MRSA colonization surveillance program. It is not intended to diagnose MRSA infection nor to guide or monitor treatment for MRSA infections.      Medications:   . [START ON 11/27/2015] aspirin EC  81 mg Oral QODAY  . enoxaparin (LOVENOX) injection  40 mg Subcutaneous Q24H  . furosemide  20 mg Oral QODAY  . guaiFENesin  1,200 mg Oral BID  . ipratropium-albuterol  3 mL Nebulization Q4H  . levofloxacin (LEVAQUIN) IV  750 mg Intravenous Q24H  . lisinopril  20 mg Oral Daily  . methylPREDNISolone (SOLU-MEDROL) injection  60 mg  Intravenous Q6H  . sodium chloride flush  3 mL Intravenous Q12H  . sodium chloride flush  3 mL Intravenous Q12H   Continuous Infusions: .  sodium chloride 250 mL (11/25/15 2151)    Time spent: 25 min   LOS: 1 day   Marinda Elk  [facility] Pager [phone]  *Please refer to [email or web address], password [email or web address] to get updated schedule on who will round on this patient, as hospitalists switch teams weekly. If 7PM-7AM, please contact night-coverage at [email or web address], password [email or web address] for any overnight needs.  11/26/2015, 8:21 AM

## 2015-11-26 NOTE — Plan of Care (Signed)
Problem: Phase I Progression Outcomes Goal: O2 sats > or equal 90% or at baseline Outcome: Progressing Pt maintaining 02 sats above 95% on 2L nasal cannula. Pt ambulated in hall with PT, 02 sat stayed above 90%, tolerated fair. Pt educated on fluid restriction. Pt educated on new medication, doxycycline. VSS, Pt afibrile at this time.

## 2015-11-26 NOTE — Progress Notes (Signed)
Utilization review completed. Mikail Goostree, RN, BSN. 

## 2015-11-26 NOTE — Progress Notes (Signed)
Initial Nutrition Assessment  DOCUMENTATION CODES:   Not applicable  INTERVENTION:    Ensure Enlive PO BID, each supplement provides 350 kcal and 20 grams of protein  NUTRITION DIAGNOSIS:   Inadequate oral intake related to poor appetite as evidenced by per patient/family report.  GOAL:   Patient will meet greater than or equal to 90% of their needs  MONITOR:   PO intake, Supplement acceptance, I & O's, Labs, Weight trends  REASON FOR ASSESSMENT:   Consult COPD Protocol  ASSESSMENT:   70 y.o. female admitted with SOB, COPD exacerbation.   Patient reports recent poor intake related to poor appetite and difficulty breathing. She has not lost any weight. Weight is elevated, likely due to fluid overload. 1800 ml fluid restriction started today. Patient used to drink Atkin's shakes to increase protein intake. She is willing to try Ensure supplements while intake is poor. Nutrition-Focused physical exam completed. Findings are no fat depletion, mild muscle depletion, and mild edema.   Diet Order:  Diet Heart Room service appropriate?: Yes; Fluid consistency:: Thin; Fluid restriction:: 1800 mL Fluid  Skin:  Reviewed, no issues  Last BM:  3/5  Height:   Ht Readings from Last 1 Encounters:  11/25/15 5\' 1"  (1.549 m)    Weight:   Wt Readings from Last 1 Encounters:  11/26/15 128 lb 9.6 oz (58.333 kg)    Ideal Body Weight:  47.7 kg  BMI:  Body mass index is 24.31 kg/(m^2).  Estimated Nutritional Needs:   Kcal:  1400-1600  Protein:  65-75 gm  Fluid:  1.4-1.6 L  EDUCATION NEEDS:   Education needs addressed  Joaquin CourtsKimberly Laylaa Guevarra, RD, LDN, CNSC Pager [phone] After Hours Pager [phone]

## 2015-11-27 ENCOUNTER — Inpatient Hospital Stay (HOSPITAL_COMMUNITY): Payer: Medicare HMO

## 2015-11-27 ENCOUNTER — Encounter (HOSPITAL_COMMUNITY): Payer: Self-pay | Admitting: Radiology

## 2015-11-27 LAB — PROCALCITONIN: Procalcitonin: <0.1 ng/mL

## 2015-11-27 MED ORDER — MOMETASONE FURO-FORMOTEROL FUM 200-5 MCG/ACT IN AERO
2.0000 | INHALATION_SPRAY | Freq: Two times a day (BID) | RESPIRATORY_TRACT | Status: DC
  Administered 2015-11-28 – 2015-12-02 (×8): 2 via RESPIRATORY_TRACT
  Filled 2015-11-27: qty 8.8

## 2015-11-27 MED ORDER — FUROSEMIDE 10 MG/ML IJ SOLN: 40.0000 mg | Freq: Two times a day (BID) | INTRAMUSCULAR | Status: DC

## 2015-11-27 MED ORDER — FUROSEMIDE 10 MG/ML IJ SOLN: 40.0000 mg | Freq: Once | INTRAMUSCULAR | Status: DC

## 2015-11-27 MED ORDER — IOHEXOL 350 MG/ML SOLN
80.0000 mL | Freq: Once | INTRAVENOUS | Status: AC | PRN
  Administered 2015-11-27: 80 mL via INTRAVENOUS

## 2015-11-27 MED ORDER — FUROSEMIDE 20 MG PO TABS
20.0000 mg | ORAL_TABLET | ORAL | Status: DC
  Administered 2015-11-28 – 2015-12-02 (×3): 20 mg via ORAL
  Filled 2015-11-27 (×4): qty 1

## 2015-11-27 NOTE — Progress Notes (Signed)
[facility] PROGRESS NOTE    Progress Note   [name] HQI:[id number] DOB: 18-Jan-1946 DOA: 2015-11-29 PCP: Kaitlyn Inch, MD   Brief Narrative:   [name] is an 70 y.o. female past medical history of COPD not on oxygen at home we'll saw her primary pulmonary physician started her on steroids but she didn't continue them at home. They comes in for worsening shortness of breath.  Assessment/Plan:   Acute on chronic respiratory failure with hypoxia (HCC) due to COPD with exacerbation (HCC) I agree with IV steroids,  Spiriva, continue albuterolwhen necessary and IV antibiotics. Continue nasal cannula oxygen. She was ambulated yesterday and her saturations have held above 90% or greater on 2 L of oxygen with ambulation. She relates her dyspnea has not improve she continues to feel that even going to the bathroom. I will go ahead and get CT imaging of the chest to rule out a PE repeat a 2-D echo. Her BNP on admission was 16, she had a previous 2-D echo in 2016 a vertical row was not well visualized. We'll try to rule out aortic stenosis.  Essential hypertension: Continue current home regimen no changes were made.  Anxiety: Continue Xanax.  SinusTachycardia Still mildly tachycardic.  DVT Prophylaxis - Lovenox ordered.  Family Communication: none Disposition Plan: Home in 3-4 days Code Status:     Code Status Orders        Start     Ordered   29-Nov-2015 1438  Full code   Continuous     November 29, 2015 1443    Code Status History    Date Active Date Inactive Code Status Order ID Comments User Context   03/13/2015 12:39 PM 03/16/2015  3:28 PM Full Code [id number]  Kaitlyn Salvage, MD Inpatient   10/08/2011  8:10 PM 10/12/2011  8:30 PM Full Code [id number]  Kaitlyn Hearing, RN ED        IV Access:    Peripheral IV   Procedures and diagnostic studies:   Dg Chest Port 1 View  Nov 29, 2015  CLINICAL DATA:  Shortness of Breath EXAM: PORTABLE CHEST 1 VIEW  COMPARISON:  08/20/2015 FINDINGS: Cardiac shadow is within normal limits. The lungs are well aerated bilaterally. Old rib fractures are noted on the left. No acute abnormality is seen. IMPRESSION: No acute abnormality noted. Electronically Signed   By: Alcide Clever M.D.   On: 2015-11-29 10:43     Medical Consultants:    None.  Anti-Infectives:   Anti-infectives    Start     Dose/Rate Route Frequency Ordered Stop   11/26/15 0900  doxycycline (VIBRAMYCIN) 100 mg in dextrose 5 % 250 mL IVPB     100 mg 125 mL/hr over 120 Minutes Intravenous 2 times daily 11/26/15 0822     11/29/15 1600  levofloxacin (LEVAQUIN) IVPB 750 mg  Status:  Discontinued     750 mg 100 mL/hr over 90 Minutes Intravenous Every 24 hours 29-Nov-2015 1514 11/26/15 0834      Subjective:    [name] she relates her shortness of breath has not improved. She relates that 6 months ago she could probably walk to the bathroom without feeling short of breath but now she feels severely short of breath when walking to the bathroom.  Objective:    Filed Vitals:   11/26/15 2340 11/27/15 0252 11/27/15 0339 11/27/15 0700  BP: 126/55  137/52   Pulse: 80  93   Temp: 98.1 F (36.7 C)  97.9 F (36.6  C) 98.2 F (36.8 C)  TempSrc: Oral  Oral Oral  Resp:   19   Height:      Weight:   57.698 kg (127 lb 3.2 oz)   SpO2: 99% 100% 93%     Intake/Output Summary (Last 24 hours) at 11/27/15 0810 Last data filed at 11/27/15 0341  Gross per 24 hour  Intake   2235 ml  Output   2550 ml  Net   -315 ml   Filed Weights   11/25/15 1844 11/26/15 0430 11/27/15 0339  Weight: 54.84 kg (120 lb 14.4 oz) 58.333 kg (128 lb 9.6 oz) 57.698 kg (127 lb 3.2 oz)    Exam: Gen:  NAD Cardiovascular:  RRR. Cannot appreciate any JVD. Chest and lungs:   Good movement, and they're clear today to auscultation. Abdomen:  Abdomen soft, NT/ND, + BS Extremities:  No edema   Data Reviewed:    Labs: Basic Metabolic Panel:  Recent Labs Lab  11/25/15 1036 11/25/15 1516 11/26/15 0051  NA 135  --  137  K 4.4  --  4.8  CL 97*  --  103  CO2 28  --  22  GLUCOSE 118*  --  140*  BUN 9  --  10  CREATININE 0.67  --  0.65  CALCIUM 9.2  --  8.5*  MG  --  2.4  --   PHOS  --  3.4  --    GFR Estimated Creatinine Clearance: 53.5 mL/min (by C-G formula based on Cr of 0.65). Liver Function Tests:  Recent Labs Lab 11/26/15 0051  AST 25  ALT 17  ALKPHOS 45  BILITOT 0.4  PROT 5.7*  ALBUMIN 3.4*   No results for input(s): LIPASE, AMYLASE in the last 168 hours. No results for input(s): AMMONIA in the last 168 hours. Coagulation profile No results for input(s): INR, PROTIME in the last 168 hours.  CBC:  Recent Labs Lab 11/25/15 1036 11/26/15 0051  WBC 7.6 6.2  NEUTROABS 5.1  --   HGB 12.4 10.9*  HCT 38.1 34.2*  MCV 88.0 88.1  PLT 304 273   Cardiac Enzymes: No results for input(s): CKTOTAL, CKMB, CKMBINDEX, TROPONINI in the last 168 hours. BNP (last 3 results)  Recent Labs  12/12/14 1006  PROBNP 33.0   CBG: No results for input(s): GLUCAP in the last 168 hours. D-Dimer: No results for input(s): DDIMER in the last 72 hours. Hgb A1c: No results for input(s): HGBA1C in the last 72 hours. Lipid Profile: No results for input(s): CHOL, HDL, LDLCALC, TRIG, CHOLHDL, LDLDIRECT in the last 72 hours. Thyroid function studies: No results for input(s): TSH, T4TOTAL, T3FREE, THYROIDAB in the last 72 hours.  Invalid input(s): FREET3 Anemia work up: No results for input(s): VITAMINB12, FOLATE, FERRITIN, TIBC, IRON, RETICCTPCT in the last 72 hours. Sepsis Labs:  Recent Labs Lab 11/25/15 1036 11/25/15 1516 11/25/15 1521 11/25/15 1856 11/25/15 2122 11/26/15 0051 11/27/15 0345  PROCALCITON  --  <0.10  --   --   --   --  <0.10  WBC 7.6  --   --   --   --  6.2  --   LATICACIDVEN  --   --  1.4 3.0* 3.4* 2.6*  --    Microbiology Recent Results (from the past 240 hour(s))  MRSA PCR Screening     Status: None    Collection Time: 11/25/15  6:47 PM  Result Value Ref Range Status   MRSA by PCR NEGATIVE NEGATIVE Final  Comment:        The GeneXpert MRSA Assay (FDA approved for NASAL specimens only), is one component of a comprehensive MRSA colonization surveillance program. It is not intended to diagnose MRSA infection nor to guide or monitor treatment for MRSA infections.      Medications:   . albuterol  2.5 mg Inhalation Q6H  . aspirin EC  81 mg Oral QODAY  . doxycycline (VIBRAMYCIN) IV  100 mg Intravenous BID  . enoxaparin (LOVENOX) injection  40 mg Subcutaneous Q24H  . feeding supplement (ENSURE ENLIVE)  237 mL Oral BID BM  . guaiFENesin  1,200 mg Oral BID  . lisinopril  20 mg Oral Daily  . methylPREDNISolone (SOLU-MEDROL) injection  60 mg Intravenous Q6H  . sodium chloride flush  3 mL Intravenous Q12H  . tiotropium  18 mcg Inhalation Daily   Continuous Infusions: . sodium chloride 50 mL/hr at 11/26/15 1836    Time spent: 25 min   LOS: 2 days   Marinda ElkFELIZ ORTIZ, ABRAHAM  [facility] Pager [phone]  *Please refer to [email or web address], password [email or web address] to get updated schedule on who will round on this patient, as hospitalists switch teams weekly. If 7PM-7AM, please contact night-coverage at [email or web address], password [email or web address] for any overnight needs.  11/27/2015, 8:10 AM

## 2015-11-27 NOTE — Progress Notes (Signed)
Physical Therapy Treatment Patient Details Name: Kaitlyn MoralesFrances L Vokes MRN: [id number] DOB: 02-02-1946 Today's Date: 11/27/2015    History of Present Illness Pt is a 70 y/o F admitted for acute on chornic respiratory failure w/ hypoxia due to COPD exacerbation.  Pt's PMH includes anxiety.    PT Comments    [name] was limited by increased WOB and required multiple rest breaks to catch her breath.  SpO2 remained in the mid 90's throughout session on 1LPM O2.  Participated in balance exercises.  Believe pt will benefit from follow up w/ cardiopulmonary rehab.   Follow Up Recommendations  No PT follow up;Supervision - Intermittent;Other (comment) (Cardiopulmonary rehab)     Equipment Recommendations  None recommended by PT    Recommendations for Other Services       Precautions / Restrictions Precautions Precaution Comments: watch O2 Restrictions Weight Bearing Restrictions: No    Mobility  Bed Mobility Overal bed mobility: Independent             General bed mobility comments: No cues or physical assist needed  Transfers Overall transfer level: Needs assistance Equipment used: None Transfers: Sit to/from Stand Sit to Stand: Supervision         General transfer comment: Min instability but no LOB.  Supervision for safety.    Ambulation/Gait             General Gait Details: deferred this session due to pt's increseased WOB w/ balance exercises in room   Stairs            Wheelchair Mobility    Modified Rankin (Stroke Patients Only)       Balance Overall balance assessment: Needs assistance Sitting-balance support: Feet supported;No upper extremity supported Sitting balance-Leahy Scale: Good     Standing balance support: No upper extremity supported;During functional activity Standing balance-Leahy Scale: Good                      Cognition Arousal/Alertness: Awake/alert Behavior During Therapy: WFL for tasks  assessed/performed Overall Cognitive Status: Within Functional Limits for tasks assessed                      Exercises General Exercises - Lower Extremity Hip Flexion/Marching: AROM;Both;20 reps;Standing Other Exercises Other Exercises: SLS Lt and Rt x2.  Limited to ~13-15 seconds before LOB but pt able to stabilize w/ bed rail in front. Other Exercises: Rhomberg w/ eyes closed w/o UE support for ~30 seconds    General Comments General comments (skin integrity, edema, etc.): Dyspnea 4/4, SpO2 remains in mid 90's throughout session on 1 L O2      Pertinent Vitals/Pain Pain Assessment: No/denies pain    Home Living                      Prior Function            PT Goals (current goals can now be found in the care plan section) Acute Rehab PT Goals Patient Stated Goal: to improve her breathing PT Goal Formulation: With patient Time For Goal Achievement: 12/10/15 Potential to Achieve Goals: Good Progress towards PT goals: Progressing toward goals    Frequency  Min 3X/week    PT Plan Current plan remains appropriate    Co-evaluation             End of Session Equipment Utilized During Treatment: Oxygen Activity Tolerance: Patient tolerated treatment well;Patient limited by fatigue Patient left: in  chair;with call bell/phone within reach     Time: 1148-1211 PT Time Calculation (min) (ACUTE ONLY): 23 min  Charges:  $Therapeutic Exercise: 23-37 mins                    G Codes:      Encarnacion Chu PT, DPT  Pager: [phone] Phone: [phone] 11/27/2015, 2:06 PM

## 2015-11-28 ENCOUNTER — Ambulatory Visit (HOSPITAL_COMMUNITY): Payer: Medicare HMO

## 2015-11-28 LAB — CBC
HEMATOCRIT: 35.9 % — AB (ref 36.0–46.0)
HEMOGLOBIN: 12.2 g/dL (ref 12.0–15.0)
MCH: 29.4 pg (ref 26.0–34.0)
MCHC: 34 g/dL (ref 30.0–36.0)
MCV: 86.5 fL (ref 78.0–100.0)
Platelets: 293 10*3/uL (ref 150–400)
RBC: 4.15 MIL/uL (ref 3.87–5.11)
RDW: 13 % (ref 11.5–15.5)
WBC: 10.7 10*3/uL — AB (ref 4.0–10.5)

## 2015-11-28 LAB — BASIC METABOLIC PANEL
ANION GAP: 14 (ref 5–15)
BUN: 10 mg/dL (ref 6–20)
CHLORIDE: 99 mmol/L — AB (ref 101–111)
CO2: 27 mmol/L (ref 22–32)
Calcium: 9.6 mg/dL (ref 8.9–10.3)
Creatinine, Ser: 0.62 mg/dL (ref 0.44–1.00)
GFR calc Af Amer: >60 mL/min (ref >60)
GFR calc non Af Amer: >60 mL/min (ref >60)
GLUCOSE: 135 mg/dL — AB (ref 65–99)
POTASSIUM: 4.6 mmol/L (ref 3.5–5.1)
Sodium: 140 mmol/L (ref 135–145)

## 2015-11-28 MED ORDER — IPRATROPIUM-ALBUTEROL 0.5-2.5 (3) MG/3ML IN SOLN: 3.0000 mL | RESPIRATORY_TRACT | Status: DC | PRN

## 2015-11-28 MED ORDER — IPRATROPIUM-ALBUTEROL 0.5-2.5 (3) MG/3ML IN SOLN
3.0000 mL | RESPIRATORY_TRACT | Status: DC
  Administered 2015-11-28 (×3): 3 mL via RESPIRATORY_TRACT
  Filled 2015-11-28 (×4): qty 3

## 2015-11-28 MED ORDER — ALBUTEROL SULFATE (2.5 MG/3ML) 0.083% IN NEBU
2.5000 mg | INHALATION_SOLUTION | RESPIRATORY_TRACT | Status: DC | PRN
  Filled 2015-11-28: qty 3

## 2015-11-28 NOTE — Care Management Important Message (Signed)
Important Message  Patient Details  Name: [name] MRN: [id number] Date of Birth: Dec 03, 1945   Medicare Important Message Given:  Yes    Kyla BalzarineShealy, Aundraya Dripps Abena 11/28/2015, 11:48 AM

## 2015-11-28 NOTE — Progress Notes (Signed)
[facility]                                                                              Patient Demographics  [name], is a 71 y.o. female, DOB - 1946-09-18, FAO:[id number]  Admit date - 11/25/2015   Admitting Physician Ozella Rocks, MD  Outpatient Primary MD for the patient is [name], MD  LOS - 3   Chief Complaint  Patient presents with  . Shortness of Breath       Brief HPI   Patient is a 70 year old female with history of COPD, not on home oxygen presented with worsening shortness of breath and COPD exacerbation.   Assessment & Plan   Acute on chronic respiratory failure with hypoxia (HCC) due to COPD with exacerbation (HCC) - Still diffusely wheezing - Continue IV steroids, placed on scheduled DuoNeb nebs every 4 hours - Continue Dulera. Patient wants Spiriva and Symbicort at the time of discharge - wean O2 as tolerated - CTA chest with no evidence of acute PE, advanced changes of emphysema - 2-D echo pending  Essential hypertension: Continue current home regimen, currently stable  Anxiety: Continue Xanax.  SinusTachycardia -Improved   Code Status: Full CODE STATUS   Family Communication: Discussed in detail with the patient, all imaging results, lab results explained to the patient or *   Disposition Plan: Likely DC home in 2-3 days  Time Spent in minutes   25 minutes  Procedures  CT angiogram chest  Consults   None  DVT Prophylaxis  Lovenox   Medications  Scheduled Meds: . aspirin EC  81 mg Oral QODAY  . doxycycline (VIBRAMYCIN) IV  100 mg Intravenous BID  . enoxaparin (LOVENOX) injection  40 mg Subcutaneous Q24H  . feeding supplement (ENSURE ENLIVE)  237 mL Oral BID BM  . furosemide  20 mg Oral QODAY  . guaiFENesin  1,200 mg Oral BID  . ipratropium-albuterol  3 mL Nebulization Q4H  . lisinopril  20 mg Oral Daily  . methylPREDNISolone (SOLU-MEDROL) injection  60 mg Intravenous Q6H  .  mometasone-formoterol  2 puff Inhalation BID  . sodium chloride flush  3 mL Intravenous Q12H   Continuous Infusions:  PRN Meds:.acetaminophen **OR** acetaminophen, albuterol, ALPRAZolam, benzonatate, guaiFENesin-dextromethorphan, ondansetron **OR** ondansetron (ZOFRAN) IV, oxyCODONE   Antibiotics   Anti-infectives    Start     Dose/Rate Route Frequency Ordered Stop   11/26/15 0900  doxycycline (VIBRAMYCIN) 100 mg in dextrose 5 % 250 mL IVPB     100 mg 125 mL/hr over 120 Minutes Intravenous 2 times daily 11/26/15 0822     11/25/15 1600  levofloxacin (LEVAQUIN) IVPB 750 mg  Status:  Discontinued     750 mg 100 mL/hr over 90 Minutes Intravenous Every 24 hours 11/25/15 1514 11/26/15 0834        Subjective:   [name] was seen and examined today. Still continues to have diffuse wheezing and shortness of breath, O2 sats 98% on 3 L. No fevers or chills.  Patient denies dizziness, chest pain, abdominal pain, N/V/D/C, new weakness, numbess, tingling. No acute events overnight.    Objective:   Filed  Vitals:   11/27/15 2100 11/28/15 0342 11/28/15 0918 11/28/15 0954  BP: 155/78 133/63  144/69  Pulse: 94 80    Temp: 97.6 F (36.4 C) 98.6 F (37 C)    TempSrc: Oral Oral    Resp: 20 17    Height:      Weight:  57.788 kg (127 lb 6.4 oz)    SpO2: 97% 100% 98%     Intake/Output Summary (Last 24 hours) at 11/28/15 1138 Last data filed at 11/28/15 0917  Gross per 24 hour  Intake    730 ml  Output   2700 ml  Net  -1970 ml     Wt Readings from Last 3 Encounters:  11/28/15 57.788 kg (127 lb 6.4 oz)  11/22/15 56.427 kg (124 lb 6.4 oz)  08/20/15 54.795 kg (120 lb 12.8 oz)     Exam  General: Alert and oriented x 3, NAD  HEENT:  PERRLA, EOMI, Anicteric Sclera, mucous membranes moist.   Neck: Supple, no JVD, no masses  CVS: S1 S2 auscultated, no rubs, murmurs or gallops. Regular rate and rhythm.  Respiratory: Diffuse wheezing bilaterally   Abdomen: Soft, nontender,  nondistended, + bowel sounds  Ext: no cyanosis clubbing or edema  Neuro: AAOx3, Cr N's II- XII. Strength 5/5 upper and lower extremities bilaterally  Skin: No rashes  Psych: Normal affect and demeanor, alert and oriented x3    Data Review   Micro Results Recent Results (from the past 240 hour(s))  MRSA PCR Screening     Status: None   Collection Time: 11/25/15  6:47 PM  Result Value Ref Range Status   MRSA by PCR NEGATIVE NEGATIVE Final    Comment:        The GeneXpert MRSA Assay (FDA approved for NASAL specimens only), is one component of a comprehensive MRSA colonization surveillance program. It is not intended to diagnose MRSA infection nor to guide or monitor treatment for MRSA infections.     Radiology Reports Ct Angio Chest Pe W/cm &/or Wo Cm  11/27/2015  CLINICAL DATA:  Shortness of breath for 2 weeks. EXAM: CT ANGIOGRAPHY CHEST WITH CONTRAST TECHNIQUE: Multidetector CT imaging of the chest was performed using the standard protocol during bolus administration of intravenous contrast. Multiplanar CT image reconstructions and MIPs were obtained to evaluate the vascular anatomy. CONTRAST:  80mL OMNIPAQUE IOHEXOL 350 MG/ML SOLN COMPARISON:  10/08/2011 FINDINGS: Mediastinum/Lymph Nodes: No pulmonary emboli or thoracic aortic dissection identified. No masses or pathologically enlarged lymph nodes identified. Aortic atherosclerosis noted. Lungs/Pleura: No pleural fluid. Moderate to advanced changes of centrilobular and paraseptal emphysema. No airspace consolidation or atelectasis. Upper abdomen: No acute findings. Musculoskeletal: No chest wall mass or suspicious bone lesions identified. Review of the MIP images confirms the above findings. IMPRESSION: 1. No evidence for acute pulmonary embolus. 2. Advanced changes of emphysema. Electronically Signed   By: Signa Kell M.D.   On: 11/27/2015 09:30   Dg Chest Port 1 View  11/25/2015  CLINICAL DATA:  Shortness of Breath EXAM:  PORTABLE CHEST 1 VIEW COMPARISON:  08/20/2015 FINDINGS: Cardiac shadow is within normal limits. The lungs are well aerated bilaterally. Old rib fractures are noted on the left. No acute abnormality is seen. IMPRESSION: No acute abnormality noted. Electronically Signed   By: Alcide Clever M.D.   On: 11/25/2015 10:43    CBC  Recent Labs Lab 11/25/15 1036 11/26/15 0051 11/28/15 0830  WBC 7.6 6.2 10.7*  HGB 12.4 10.9* 12.2  HCT 38.1 34.2* 35.9*  PLT 304 273 293  MCV 88.0 88.1 86.5  MCH 28.6 28.1 29.4  MCHC 32.5 31.9 34.0  RDW 12.6 12.8 13.0  LYMPHSABS 1.4  --   --   MONOABS 0.8  --   --   EOSABS 0.2  --   --   BASOSABS 0.1  --   --     Chemistries   Recent Labs Lab 11/25/15 1036 11/25/15 1516 11/26/15 0051 11/28/15 0830  NA 135  --  137 140  K 4.4  --  4.8 4.6  CL 97*  --  103 99*  CO2 28  --  22 27  GLUCOSE 118*  --  140* 135*  BUN 9  --  10 10  CREATININE 0.67  --  0.65 0.62  CALCIUM 9.2  --  8.5* 9.6  MG  --  2.4  --   --   AST  --   --  25  --   ALT  --   --  17  --   ALKPHOS  --   --  45  --   BILITOT  --   --  0.4  --    ------------------------------------------------------------------------------------------------------------------ estimated creatinine clearance is 53.5 mL/min (by C-G formula based on Cr of 0.62). ------------------------------------------------------------------------------------------------------------------ No results for input(s): HGBA1C in the last 72 hours. ------------------------------------------------------------------------------------------------------------------ No results for input(s): CHOL, HDL, LDLCALC, TRIG, CHOLHDL, LDLDIRECT in the last 72 hours. ------------------------------------------------------------------------------------------------------------------ No results for input(s): TSH, T4TOTAL, T3FREE, THYROIDAB in the last 72 hours.  Invalid input(s):  FREET3 ------------------------------------------------------------------------------------------------------------------ No results for input(s): VITAMINB12, FOLATE, FERRITIN, TIBC, IRON, RETICCTPCT in the last 72 hours.  Coagulation profile No results for input(s): INR, PROTIME in the last 168 hours.  No results for input(s): DDIMER in the last 72 hours.  Cardiac Enzymes No results for input(s): CKMB, TROPONINI, MYOGLOBIN in the last 168 hours.  Invalid input(s): CK ------------------------------------------------------------------------------------------------------------------ Invalid input(s): POCBNP  No results for input(s): GLUCAP in the last 72 hours.   Mitali Shenefield M.D. [facility] 11/28/2015, 11:38 AM  Pager: [phone] Between 7am to 7pm - call Pager - [phone]  After 7pm go to [email or web address] - password [email or web address]  Call night coverage person covering after 7pm

## 2015-11-28 NOTE — Care Management Note (Addendum)
Case Management Note  Patient Details  Name: [name] MRN: [id number] Date of Birth: [birth year]  Subjective/Objective:   Pt admitted for COPD Exacerbation. Initiated on IV solumedrol. Pt is from home alone.                  [facility] [employer] at 2 L.    Expected Discharge Date:                  Expected Discharge Plan:  Home w Home Health Services  In-House Referral:     Discharge planning Services  CM Consult  Post Acute Care Choice:   N/A Choice offered to:   N/A  DME Arranged:    N/A DME Agency:   N/A  HH Arranged:   N/A HH Agency:   N/A  Status of Service:  Completed.  Medicare Important Message Given:  Yes Date Medicare IM Given:    Medicare IM give by:    Date Additional Medicare IM Given:    Additional Medicare Important Message give by:     If discussed at Long Length of Stay Meetings, dates discussed:    Additional Comments: 11-29-15 1458 Kaitlyn BambergerBrenda Graves-Bigelow, RN,BSN [phone] CM did speak with pt and family to bring 02 tanks once stable for d/c. Pt states she will not need HHRN once stable for d/c. She has been to cardiopulmonary rehab in the past and received education. No further needs at this time.   [name], Renate Danh Kaye, RN 11/28/2015, 3:12 PM

## 2015-11-28 NOTE — Progress Notes (Signed)
UR Completed Tyner Codner Graves-Bigelow, RN,BSN [phone]  

## 2015-11-29 ENCOUNTER — Inpatient Hospital Stay (HOSPITAL_COMMUNITY): Payer: Medicare HMO

## 2015-11-29 DIAGNOSIS — R0600 Dyspnea, unspecified: Secondary | ICD-10-CM

## 2015-11-29 DIAGNOSIS — J441 Chronic obstructive pulmonary disease with (acute) exacerbation: Principal | ICD-10-CM

## 2015-11-29 LAB — CBC
HEMATOCRIT: 37.2 % (ref 36.0–46.0)
Hemoglobin: 12 g/dL (ref 12.0–15.0)
MCH: 28 pg (ref 26.0–34.0)
MCHC: 32.3 g/dL (ref 30.0–36.0)
MCV: 86.9 fL (ref 78.0–100.0)
PLATELETS: 294 10*3/uL (ref 150–400)
RBC: 4.28 MIL/uL (ref 3.87–5.11)
RDW: 12.7 % (ref 11.5–15.5)
WBC: 8.3 10*3/uL (ref 4.0–10.5)

## 2015-11-29 LAB — BASIC METABOLIC PANEL
ANION GAP: 12 (ref 5–15)
BUN: 10 mg/dL (ref 6–20)
CHLORIDE: 95 mmol/L — AB (ref 101–111)
CO2: 28 mmol/L (ref 22–32)
CREATININE: 0.57 mg/dL (ref 0.44–1.00)
Calcium: 9.1 mg/dL (ref 8.9–10.3)
GFR calc non Af Amer: >60 mL/min (ref >60)
Glucose, Bld: 129 mg/dL — ABNORMAL HIGH (ref 65–99)
POTASSIUM: 3.7 mmol/L (ref 3.5–5.1)
SODIUM: 135 mmol/L (ref 135–145)

## 2015-11-29 LAB — PROCALCITONIN: Procalcitonin: <0.1 ng/mL

## 2015-11-29 LAB — ECHOCARDIOGRAM COMPLETE
Height: 61 in
Weight: 1992 oz

## 2015-11-29 MED ORDER — IPRATROPIUM-ALBUTEROL 0.5-2.5 (3) MG/3ML IN SOLN
3.0000 mL | Freq: Three times a day (TID) | RESPIRATORY_TRACT | Status: DC
  Administered 2015-11-29 (×2): 3 mL via RESPIRATORY_TRACT
  Filled 2015-11-29 (×2): qty 3

## 2015-11-29 MED ORDER — IPRATROPIUM-ALBUTEROL 0.5-2.5 (3) MG/3ML IN SOLN
3.0000 mL | Freq: Three times a day (TID) | RESPIRATORY_TRACT | Status: DC
Start: 2015-11-29 — End: 2015-11-29
  Administered 2015-11-29: 3 mL via RESPIRATORY_TRACT
  Filled 2015-11-29: qty 3

## 2015-11-29 MED ORDER — IPRATROPIUM-ALBUTEROL 0.5-2.5 (3) MG/3ML IN SOLN
3.0000 mL | Freq: Three times a day (TID) | RESPIRATORY_TRACT | Status: DC
  Administered 2015-11-30 – 2015-12-02 (×8): 3 mL via RESPIRATORY_TRACT
  Filled 2015-11-29 (×8): qty 3

## 2015-11-29 MED ORDER — IPRATROPIUM-ALBUTEROL 0.5-2.5 (3) MG/3ML IN SOLN: 3.0000 mL | Freq: Three times a day (TID) | RESPIRATORY_TRACT | Status: DC

## 2015-11-29 NOTE — Progress Notes (Addendum)
PROGRESS NOTE  Kaitlyn MoralesFrances L Ibarra ZOX:[id number] DOB: [birth year] DOA: 11/25/2015 PCP: Eartha InchBADGER,MICHAEL C, MD Outpatient Specialists:    LOS: 4 days   Brief Narrative: Patient is a 70 year old female with history of COPD, not on home oxygen presented with worsening shortness of breath and COPD exacerbation.  Assessment & Plan: Active Problems:   Essential hypertension   COPD with exacerbation (HCC)   Acute respiratory failure (HCC)   Acute on chronic respiratory failure with hypoxia (HCC)   Anxiety   Dependent edema   Tachycardia   Acute on chronic respiratory failure with hypoxia (HCC) due to COPD with exacerbation (HCC) - Still diffusely wheezing - Continue IV steroids, placed on scheduled Duo-Neb nebs - Continue Dulera.  - wean O2 as tolerated - CTA chest with no evidence of acute PE, advanced changes of emphysema - 2-D echo with normal EF - Consulted pulmonology today given lack of improvement despite above-mentioned measures, appreciate input  RT, please DO NOT change anymore patient's current regimen.  Essential hypertension - Continue current home regimen, currently stable  Anxiety - Continue Xanax.  SinusTachycardia - Improved    DVT prophylaxis: Lovenox Code Status: Full Family Communication: no family bedside Disposition Plan: home when ready  Barriers for discharge: respiratory status  Consultants:   Pulmonology  Procedures:   2D echo  Antimicrobials:  Doxycycline 3/7 >>   Levaquin 1 dose on 3/7  Subjective: - Continues to feel very short of breath, appears very anxious as well, multiple complaints this morning about the nighttime management of her breathing treatments  Objective: Filed Vitals:   11/28/15 1934 11/29/15 0334 11/29/15 0852 11/29/15 1050  BP: 138/67 143/67  154/58  Pulse: 86 86    Temp: 97.6 F (36.4 C) 97.9 F (36.6 C)    TempSrc: Oral Oral    Resp: 17 16    Height:      Weight:  56.473 kg (124 lb 8 oz)    SpO2: 97% 96%  96%     Intake/Output Summary (Last 24 hours) at 11/29/15 1218 Last data filed at 11/29/15 1218  Gross per 24 hour  Intake      3 ml  Output   4700 ml  Net  -4697 ml   Filed Weights   11/27/15 0339 11/28/15 0342 11/29/15 0334  Weight: 57.698 kg (127 lb 3.2 oz) 57.788 kg (127 lb 6.4 oz) 56.473 kg (124 lb 8 oz)   Examination: BP 154/58 mmHg  Pulse 86  Temp(Src) 97.9 F (36.6 C) (Oral)  Resp 16  Ht 5\' 1"  (1.549 m)  Wt 56.473 kg (124 lb 8 oz)  BMI 23.54 kg/m2  SpO2 96% General exam: NAD HEENT: PERRLA, EOMI, Anicteric Sclera, mucous membranes moist. Respiratory system: Diffuse wheezing bilaterally Cardiovascular system: regular rate and rhythm, no murmurs, gallops. No JVD. No peripheral edema.  Gastrointestinal system: Abdomen is nondistended, soft and nontender. Normal bowel sounds heard. Central nervous system: AxOx3. No focal deficits  Data Reviewed: I have personally reviewed following labs and imaging studies  CBC:  Recent Labs Lab 11/25/15 1036 11/26/15 0051 11/28/15 0830 11/29/15 0438  WBC 7.6 6.2 10.7* 8.3  NEUTROABS 5.1  --   --   --   HGB 12.4 10.9* 12.2 12.0  HCT 38.1 34.2* 35.9* 37.2  MCV 88.0 88.1 86.5 86.9  PLT 304 273 293 294   Basic Metabolic Panel:  Recent Labs Lab 11/25/15 1036 11/25/15 1516 11/26/15 0051 11/28/15 0830 11/29/15 0438  NA 135  --  137 140 135  K 4.4  --  4.8 4.6 3.7  CL 97*  --  103 99* 95*  CO2 28  --  GLUCOSE 118*  --  140* 135* 129*  BUN 9  --  CREATININE 0.67  --  0.65 0.62 0.57  CALCIUM 9.2  --  8.5* 9.6 9.1  MG  --  2.4  --   --   --   PHOS  --  3.4  --   --   --    GFR: Estimated Creatinine Clearance: 49.4 mL/min (by C-G formula based on Cr of 0.57). Liver Function Tests:  Recent Labs Lab 11/26/15 0051  AST 25  ALT 17  ALKPHOS 45  BILITOT 0.4  PROT 5.7*  ALBUMIN 3.4*   BNP (last 3 results)  Recent Labs  12/12/14 1006  PROBNP 33.0    Recent Results (from the past 240  hour(s))  MRSA PCR Screening     Status: None   Collection Time: 11/25/15  6:47 PM  Result Value Ref Range Status   MRSA by PCR NEGATIVE NEGATIVE Final    Comment:        The GeneXpert MRSA Assay (FDA approved for NASAL specimens only), is one component of a comprehensive MRSA colonization surveillance program. It is not intended to diagnose MRSA infection nor to guide or monitor treatment for MRSA infections.       Radiology Studies: No results found.   Scheduled Meds: . aspirin EC  81 mg Oral QODAY  . doxycycline (VIBRAMYCIN) IV  100 mg Intravenous BID  . enoxaparin (LOVENOX) injection  40 mg Subcutaneous Q24H  . feeding supplement (ENSURE ENLIVE)  237 mL Oral BID BM  . furosemide  20 mg Oral QODAY  . guaiFENesin  1,200 mg Oral BID  . ipratropium-albuterol  3 mL Nebulization TID  . lisinopril  20 mg Oral Daily  . methylPREDNISolone (SOLU-MEDROL) injection  60 mg Intravenous Q6H  . mometasone-formoterol  2 puff Inhalation BID  . sodium chloride flush  3 mL Intravenous Q12H   Continuous Infusions:   Time spent: 25 minutes, more than 50% bedside in 2 visits, answering questions and discussing her medical condition  Pamella Pert, MD, PhD [facility] Pager [phone]  If 7PM-7AM, please contact night-coverage [email or web address] Password [email or web address] 11/29/2015, 12:18 PM

## 2015-11-29 NOTE — Consult Note (Addendum)
Name: [name] MRN: [id number] DOB: 12-Jan-1946    ADMISSION DATE:  11/25/2015 CONSULTATION DATE:  11/29/2015  REFERRING MD :  Elvera Lennox  CHIEF COMPLAINT:  Respiratory distress  HISTORY OF PRESENT ILLNESS:  70 year old smoker with COPD on 2 L home oxygen during sleep/exertion admitted on 3/6 for worsening respiratory distress and treated for COPD exacerbation with steroids and bronchodilators. We are consulted today since there is no improvement over the last 4 days. She sees my partner Dr. Maple Hudson and was last seen on 3/3 in the office when she was given a Solu-Medrol shot. She quit smoking in 06/2015. PFTs in 06/2011 show FEV1 of 33% consistent with severe airway obstruction and hyperinflation and air trapping consistent with emphysema She is maintained on her home regimen of Symbicort and DuoNeb's. She has also been using Combivent in addition and admits to having severe tremors. Medication review also shows lisinopril She reports dry cough and denies sputum production There is no chest pain or palpitations  SIGNIFICANT EVENTS    STUDIES:  CT angios 3/8-negative PE, advanced emphysema 3/10 echo -normal LV function, no evidence of pulmonary hypertension     PAST MEDICAL HISTORY :   has a past medical history of Chronic airway obstruction, not elsewhere classified; Hypertension; Ventilator dependent (HCC) (2004; 2008); Pneumonia; H/O chronic bronchitis; and Shortness of breath (10/08/11).  has past surgical history that includes Oophorectomy (1986); Vaginal hysterectomy (1978); and Dilation and curettage of uterus (1970's). Prior to Admission medications   Medication Sig Start Date End Date Taking? Authorizing Provider  ALPRAZolam (XANAX) 0.5 MG tablet TAKE 1 TABLET THREE TIMES DAILY AS NEEDED FOR ANXIETY  OR  SLEEP 06/24/15  Yes Waymon Budge, MD  aspirin 81 MG tablet Take 81 mg by mouth every other day. Take one tablet every other day   Yes Historical Provider, MD    budesonide-formoterol [facility]) 160-4.5 MCG/ACT inhaler 2 puffs then rinse mouth, twice daily 06/10/15  Yes Waymon Budge, MD  furosemide (LASIX) 20 MG tablet Take 20 mg by mouth every other day.   Yes Historical Provider, MD  Ipratropium-Albuterol (COMBIVENT) 20-100 MCG/ACT AERS respimat Inhale 1 puff into the lungs every 6 (six) hours. 06/20/15  Yes Waymon Budge, MD  ipratropium-albuterol (DUONEB) 0.5-2.5 (3) MG/3ML SOLN USE ONE VIAL VIA NEBULIZER 2 TIMES DAILY 03/19/15  Yes Waymon Budge, MD  lisinopril (PRINIVIL,ZESTRIL) 20 MG tablet Take 20 mg by mouth daily.   Yes Historical Provider, MD  Multiple Vitamins-Minerals (PRESERVISION AREDS 2 PO) Take 2 capsules by mouth daily. Reported on 11/22/2015   Yes Historical Provider, MD  POTASSIUM GLUCONATE PO Take 1 tablet by mouth daily. Patient gets this medication over the counter -dose unknown   Yes Historical Provider, MD  guaiFENesin (MUCINEX) 600 MG 12 hr tablet Take 1 tablet (600 mg total) by mouth 2 (two) times daily. Patient not taking: Reported on 11/22/2015 03/15/15   Beather Arbour, MD   No Known Allergies  FAMILY HISTORY:  family history includes Emphysema in her father; Liver cancer in her mother. SOCIAL HISTORY:  reports that she quit smoking about 5 months ago. Her smoking use included Cigarettes. She has a 20 pack-year smoking history. She quit smokeless tobacco use about 9 months ago. She reports that she drinks about 2.4 oz of alcohol per week. She reports that she does not use illicit drugs.  REVIEW OF SYSTEMS:   Positives as noted above-shortness of breath, tremors and dry cough   Constitutional: Negative for  fever, chills, weight loss, malaise/fatigue and diaphoresis.  HENT: Negative for hearing loss, ear pain, nosebleeds, congestion, sore throat, neck pain, tinnitus and ear discharge.   Eyes: Negative for blurred vision, double vision, photophobia, pain, discharge and redness.  Respiratory: Negative for  hemoptysis,  sputum production,  wheezing and stridor.   Cardiovascular: Negative for chest pain, palpitations, orthopnea, claudication, leg swelling and PND.  Gastrointestinal: Negative for heartburn, nausea, vomiting, abdominal pain, diarrhea, constipation, blood in stool and melena.  Genitourinary: Negative for dysuria, urgency, frequency, hematuria and flank pain.  Musculoskeletal: Negative for myalgias, back pain, joint pain and falls.  Skin: Negative for itching and rash.  Neurological: Negative for dizziness, tingling,  sensory change, speech change, focal weakness, seizures, loss of consciousness, weakness and headaches.  Endo/Heme/Allergies: Negative for environmental allergies and polydipsia. Does not bruise/bleed easily.  SUBJECTIVE:   VITAL SIGNS: Temp:  [97.6 F (36.4 C)-98.2 F (36.8 C)] 98.2 F (36.8 C) (03/10 1439) Pulse Rate:  [86-99] 99 (03/10 1439) Resp:  [16-17] 16 (03/10 0334) BP: (138-154)/(58-67) 153/65 mmHg (03/10 1439) SpO2:  [96 %-100 %] 100 % (03/10 1439) FiO2 (%):  [32 %] 32 % (03/10 1426) Weight:  [124 lb 8 oz (56.473 kg)] 124 lb 8 oz (56.473 kg) (03/10 0334)  PHYSICAL EXAMINATION: Gen. Pleasant, thin chronically ill, in mild distress, anxious affect ENT - no lesions, no post nasal drip Neck: No JVD, no thyromegaly, no carotid bruits Lungs: no use of accessory muscles, no dullness to percussion, decreased without rales, faint scattered rhonchi  Cardiovascular: Rhythm regular, heart sounds  normal, no murmurs, no peripheral edema Abdomen: soft and non-tender, no hepatosplenomegaly, BS normal. Musculoskeletal: No deformities, no cyanosis or clubbing Neuro:  alert, non focal, tremors + Skin:  Warm, no lesions/ rash     Recent Labs Lab 11/26/15 0051 11/28/15 0830 11/29/15 0438  NA 137 140 135  K 4.8 4.6 3.7  CL 103 99* 95*  CO2 22 27 28   BUN 10 10 10   CREATININE 0.65 0.62 0.57  GLUCOSE 140* 135* 129*    Recent Labs Lab 11/26/15 0051 11/28/15 0830  11/29/15 0438  HGB 10.9* 12.2 12.0  HCT 34.2* 35.9* 37.2  WBC 6.2 10.7* 8.3  PLT 273 293 294   No results found.  ASSESSMENT / PLAN:  Advanced emphysema COPD exacerbation Concern for bronchodilator toxicity  Recommend- am concerned that she is getting too much bronchodilators as evidenced by tremors and tachycardia and her extreme anxiety. Would decrease to 23-4 times daily, would discontinue Combivent on discharge. She does not need Spiriva-since she is already on ipratropium.  Continue Solu-Medrol 60 every 6 for now and taper down as her bronchospasm improves  Would agree with alprazolam 0.5  mg twice daily for now given severe anxiety until her tremors decrease-not sure if she needs this long-term  We can continue dulera in the hospital and switched back to Symbicort at discharge  Hypertension- I have taken the liberty of stopping lisinopril. Her blood pressure however the next 24 hours consider using ARB or Norvasc  Cyril Mourningakesh Alva MD. FCCP. [facility] Pager [phone] If no response call [phone]    11/29/2015, 3:38 PM

## 2015-11-29 NOTE — Progress Notes (Signed)
Physical Therapy Treatment Patient Details Name: [name] MRN: [id number] DOB: 28-Jul-1946 Today's Date: 11/29/2015    History of Present Illness Pt is a 70 y/o F admitted for acute on chornic respiratory failure w/ hypoxia due to COPD exacerbation.  Pt's PMH includes anxiety.    PT Comments    Initially SpO2 high 90's on 4LPM O2 while ambulating.  Gradually bumped down to RA where pt was able to tolerate for ~1 minute ambulating before SpO2 down to 89%.  Increased O2 to 1LPM and SpO2 remained at 90% or above while ambulating back to room.  Pt required several standing rest breaks due to dyspnea.  Pt will benefit from continued skilled PT services to increase functional independence and safety.   Follow Up Recommendations  No PT follow up;Supervision - Intermittent;Other (comment) (Cardiopulmonary Rehab)     Equipment Recommendations  None recommended by PT    Recommendations for Other Services       Precautions / Restrictions Precautions Precaution Comments: watch O2 Restrictions Weight Bearing Restrictions: No    Mobility  Bed Mobility               General bed mobility comments: Pt sitting in recliner chair upon arrival  Transfers Overall transfer level: Needs assistance Equipment used: None Transfers: Sit to/from Stand Sit to Stand: Supervision         General transfer comment: Supervision for safety.  Ambulation/Gait Ambulation/Gait assistance: Supervision Ambulation Distance (Feet): 200 Feet Assistive device: None Gait Pattern/deviations: Step-through pattern   Gait velocity interpretation: Below normal speed for age/gender General Gait Details: SpO2 high 90's on 4LPM O2.  Gradually bumped down to RA where pt was able to tolerate for ~1 minute ambulating before SpO2 down to 89%.  Increased O2 to 1LPM and SpO2 remained at 90% or above while ambulating back to room.  Pt required several standing rest breaks due to dyspnea.   Stairs            Wheelchair Mobility    Modified Rankin (Stroke Patients Only)       Balance Overall balance assessment: Needs assistance Sitting-balance support: Feet supported;No upper extremity supported Sitting balance-Leahy Scale: Normal     Standing balance support: No upper extremity supported;During functional activity Standing balance-Leahy Scale: Good                      Cognition Arousal/Alertness: Awake/alert Behavior During Therapy: WFL for tasks assessed/performed Overall Cognitive Status: Within Functional Limits for tasks assessed                      Exercises      General Comments        Pertinent Vitals/Pain Pain Assessment: No/denies pain    Home Living                      Prior Function            PT Goals (current goals can now be found in the care plan section) Acute Rehab PT Goals Patient Stated Goal: to improve her breathing PT Goal Formulation: With patient Time For Goal Achievement: 12/10/15 Potential to Achieve Goals: Good Progress towards PT goals: Progressing toward goals    Frequency  Min 3X/week    PT Plan Current plan remains appropriate    Co-evaluation             End of Session Equipment Utilized During Treatment: Oxygen;Gait belt Activity  Tolerance: Patient tolerated treatment well;Patient limited by fatigue;Other (comment) (dyspnea) Patient left: in chair;with call bell/phone within reach     Time: [id number] PT Time Calculation (min) (ACUTE ONLY): 18 min  Charges:  $Gait Training: 8-22 mins                    G Codes:      [name] PT, DPT  Pager: [phone] Phone: [phone] 11/29/2015, 4:02 PM

## 2015-11-29 NOTE — Progress Notes (Signed)
  Echocardiogram 2D Echocardiogram has been performed.  Leta JunglingCooper, Avo Schlachter M 11/29/2015, 9:57 AM

## 2015-11-30 LAB — BASIC METABOLIC PANEL
Anion gap: 11 (ref 5–15)
BUN: 16 mg/dL (ref 6–20)
CALCIUM: 9.1 mg/dL (ref 8.9–10.3)
CO2: 28 mmol/L (ref 22–32)
CREATININE: 0.53 mg/dL (ref 0.44–1.00)
Chloride: 95 mmol/L — ABNORMAL LOW (ref 101–111)
Glucose, Bld: 122 mg/dL — ABNORMAL HIGH (ref 65–99)
Potassium: 4.3 mmol/L (ref 3.5–5.1)
SODIUM: 134 mmol/L — AB (ref 135–145)

## 2015-11-30 MED ORDER — DOXYCYCLINE HYCLATE 100 MG PO TABS
100.0000 mg | ORAL_TABLET | Freq: Two times a day (BID) | ORAL | Status: DC
  Administered 2015-11-30 – 2015-12-02 (×5): 100 mg via ORAL
  Filled 2015-11-30 (×5): qty 1

## 2015-11-30 MED ORDER — METHYLPREDNISOLONE SODIUM SUCC 125 MG IJ SOLR
60.0000 mg | Freq: Two times a day (BID) | INTRAMUSCULAR | Status: DC
  Administered 2015-11-30 – 2015-12-01 (×2): 60 mg via INTRAVENOUS
  Filled 2015-11-30 (×2): qty 2

## 2015-11-30 MED ORDER — MENTHOL 3 MG MT LOZG
1.0000 | LOZENGE | OROMUCOSAL | Status: DC | PRN
Start: 2015-11-30 — End: 2015-12-02
  Filled 2015-11-30: qty 9

## 2015-11-30 MED ORDER — LISINOPRIL 20 MG PO TABS
20.0000 mg | ORAL_TABLET | Freq: Every day | ORAL | Status: DC
  Administered 2015-11-30 – 2015-12-02 (×3): 20 mg via ORAL
  Filled 2015-11-30 (×3): qty 1

## 2015-11-30 NOTE — Progress Notes (Signed)
Name: Kaitlyn MoralesFrances L Alms MRN: [id number] DOB: [birth year]    ADMISSION DATE:  11/25/2015 CONSULTATION DATE:  11/30/2015  REFERRING MD :  Elvera LennoxGherghe  CHIEF COMPLAINT:  Respiratory distress  HISTORY OF PRESENT ILLNESS:  70 year old smoker with COPD on 2 L home oxygen during sleep/exertion admitted on 3/6 for worsening respiratory distress and treated for COPD exacerbation with steroids and bronchodilators. We are consulted today since there is no improvement over the last 4 days. She sees my partner Dr. Maple HudsonYoung and was last seen on 3/3 in the office when she was given a Solu-Medrol shot. She quit smoking in 06/2015. PFTs in 06/2011 show FEV1 of 33% consistent with severe airway obstruction and hyperinflation and air trapping consistent with emphysema She is maintained on her home regimen of Symbicort and DuoNeb's. She has also been using Combivent in addition and admits to having severe tremors. Medication review also shows lisinopril She reports dry cough and denies sputum production There is no chest pain or palpitations  SIGNIFICANT EVENTS    STUDIES:  CT angios 3/8-negative PE, advanced emphysema 3/10 echo -normal LV function, no evidence of pulmonary hypertension  CT chest reviewed by me.- Severe emphysema  SUBJECTIVE:  Admits better. Cough remains dry. Says it helps to get up and walk around.  VITAL SIGNS: Temp:  [97.7 F (36.5 C)-98.2 F (36.8 C)] 97.8 F (36.6 C) (03/11 0503) Pulse Rate:  [76-99] 89 (03/11 0503) Resp:  [18] 18 (03/10 2053) BP: (153-156)/(58-65) 154/63 mmHg (03/11 0503) SpO2:  [96 %-100 %] 100 % (03/11 0503) FiO2 (%):  [32 %] 32 % (03/10 1426) Weight:  [55.5 kg (122 lb 5.7 oz)] 55.5 kg (122 lb 5.7 oz) (03/11 0503)  PHYSICAL EXAMINATION: Gen. Pleasant, thin chronically ill, in mild distress, anxious affect, talkative ENT - no lesions, no post nasal drip Neck: No JVD, no thyromegaly, no carotid bruits Lungs: no use of accessory muscles, no dullness to  percussion, decreased without rales, very distant Cardiovascular: Rhythm regular, heart sounds  normal, no murmurs, no peripheral edema Abdomen: soft and non-tender, no hepatosplenomegaly, BS normal. Musculoskeletal: No deformities, no cyanosis or clubbing Neuro:  alert, non focal, tremors minimal Skin:  Warm, no lesions/ rash     Recent Labs Lab 11/28/15 0830 11/29/15 0438 11/30/15 0533  NA 140 135 134*  K 4.6 3.7 4.3  CL 99* 95* 95*  CO2 27 28 28   BUN 10 10 16   CREATININE 0.62 0.57 0.53  GLUCOSE 135* 129* 122*    Recent Labs Lab 11/26/15 0051 11/28/15 0830 11/29/15 0438  HGB 10.9* 12.2 12.0  HCT 34.2* 35.9* 37.2  WBC 6.2 10.7* 8.3  PLT 273 293 294   No results found.  ASSESSMENT / PLAN:  Advanced emphysema COPD exacerbation Concern for bronchodilator toxicity  She wass getting too much bronchodilators as evidenced by tremors and tachycardia and her extreme anxiety. Meds are reviewed with her at every office visit. As she gets dyspneic, she increases use, trying to avoid hospitalization. Would decrease to 3-4 times daily, Emphasize use of either/ or neb/DuoNeb or Combivent as rescue med on discharge. She does not need Spiriva-since she is already on ipratropium.  Changed IV vibramycin to po doxycycline  Changed Solu-Medrol 60 every 12 for now . Probably prednisone tomorrow  Would agree with alprazolam 0.5  mg twice daily for now given severe anxiety until her tremors decrease-not sure if she needs this long-term  We can continue dulera in the hospital and switched back to Symbicort at discharge  Hypertension- We have taken the liberty of stopping lisinopril. Her blood pressure however the next 24 hours consider using ARB or Norvasc  CD Maple Hudson,  MD [facility] Pager [phone] If no response call [phone]    11/30/2015, 8:35 AM

## 2015-11-30 NOTE — Progress Notes (Signed)
PROGRESS NOTE  [name] WUJ:[id number] DOB: 02-01-1946 DOA: 11/25/2015 PCP: [name], MD Outpatient Specialists:    LOS: 5 days   Brief Narrative: Patient is a 70 year old female with history of COPD, not on home oxygen presented with worsening shortness of breath and COPD exacerbation.  Assessment & Plan: Active Problems:   Essential hypertension   COPD with exacerbation (HCC)   Acute respiratory failure (HCC)   Acute on chronic respiratory failure with hypoxia (HCC)   Anxiety   Dependent edema   Tachycardia   Acute on chronic respiratory failure with hypoxia (HCC) due to COPD with exacerbation (HCC) - Still diffusely wheezing however appears improved - Continue IV steroids, placed on scheduled Duo-Nebs. Decreased steroids today  - Continue Dulera.  - wean O2 as tolerated - CTA chest with no evidence of acute PE, advanced changes of emphysema - 2-D echo with normal EF - appreciate pulmonary input, discussed with Dr. Maple Hudson today  RT, please DO NOT change anymore patient's current regimen.  Essential hypertension - Continue current home regimen, currently stable  Anxiety - Continue Xanax.  SinusTachycardia - Improved    DVT prophylaxis: Lovenox Code Status: Full Family Communication: no family bedside Disposition Plan: home when ready  Barriers for discharge: respiratory status  Consultants:   Pulmonology  Procedures:   2D echo  Antimicrobials:  Doxycycline 3/7 >>   Levaquin 1 dose on 3/7  Subjective: - feels a little bit improved today, she was able to walk in the room with less dyspnea than yesterday.  Objective: Filed Vitals:   11/29/15 1439 11/29/15 2053 11/30/15 0148 11/30/15 0503  BP: 153/65 156/64  154/63  Pulse: 99 76  89  Temp: 98.2 F (36.8 C) 97.7 F (36.5 C)  97.8 F (36.6 C)  TempSrc: Oral Oral  Oral  Resp:  18    Height:      Weight:    55.5 kg (122 lb 5.7 oz)  SpO2: 100% 100% 100% 100%    Intake/Output  Summary (Last 24 hours) at 11/30/15 1223 Last data filed at 11/30/15 1216  Gross per 24 hour  Intake    363 ml  Output   3300 ml  Net  -2937 ml   Filed Weights   11/28/15 0342 11/29/15 0334 11/30/15 0503  Weight: 57.788 kg (127 lb 6.4 oz) 56.473 kg (124 lb 8 oz) 55.5 kg (122 lb 5.7 oz)   Examination: BP 154/63 mmHg  Pulse 89  Temp(Src) 97.8 F (36.6 C) (Oral)  Resp 18  Ht  (1.549 m)  Wt 55.5 kg (122 lb 5.7 oz)  BMI 23.13 kg/m2  SpO2 100% General exam: NAD HEENT: PERRLA, EOMI, Anicteric Sclera, mucous membranes moist. Respiratory system: Diffuse wheezing bilaterally, improved Cardiovascular system: regular rate and rhythm, no murmurs, gallops. No JVD. No peripheral edema.  Gastrointestinal system: Abdomen is nondistended, soft and nontender. Normal bowel sounds heard. Central nervous system: AxOx3. No focal deficits  Data Reviewed: I have personally reviewed following labs and imaging studies  CBC:  Recent Labs Lab 11/25/15 1036 11/26/15 0051 11/28/15 0830 11/29/15 0438  WBC 7.6 6.2 10.7* 8.3  NEUTROABS 5.1  --   --   --   HGB 12.4 10.9* 12.2 12.0  HCT 38.1 34.2* 35.9* 37.2  MCV 88.0 88.1 86.5 86.9  PLT 304 273 293 294   Basic Metabolic Panel:  Recent Labs Lab 11/25/15 1036 11/25/15 1516 11/26/15 0051 11/28/15 0830 11/29/15 0438 11/30/15 0533  NA 135  --  137 140  135 134*  K 4.4  --  4.8 4.6 3.7 4.3  CL 97*  --  103 99* 95* 95*  CO2 28  --  22 27 28 28   GLUCOSE 118*  --  140* 135* 129* 122*  BUN 9  --  10 10 10 16   CREATININE 0.67  --  0.65 0.62 0.57 0.53  CALCIUM 9.2  --  8.5* 9.6 9.1 9.1  MG  --  2.4  --   --   --   --   PHOS  --  3.4  --   --   --   --    GFR: Estimated Creatinine Clearance: 49.4 mL/min (by C-G formula based on Cr of 0.53). Liver Function Tests:  Recent Labs Lab 11/26/15 0051  AST 25  ALT 17  ALKPHOS 45  BILITOT 0.4  PROT 5.7*  ALBUMIN 3.4*   BNP (last 3 results)  Recent Labs  12/12/14 1006  PROBNP 33.0     Recent Results (from the past 240 hour(s))  MRSA PCR Screening     Status: None   Collection Time: 11/25/15  6:47 PM  Result Value Ref Range Status   MRSA by PCR NEGATIVE NEGATIVE Final    Comment:        The GeneXpert MRSA Assay (FDA approved for NASAL specimens only), is one component of a comprehensive MRSA colonization surveillance program. It is not intended to diagnose MRSA infection nor to guide or monitor treatment for MRSA infections.       Radiology Studies: No results found.   Scheduled Meds: . aspirin EC  81 mg Oral QODAY  . doxycycline  100 mg Oral Q12H  . enoxaparin (LOVENOX) injection  40 mg Subcutaneous Q24H  . feeding supplement (ENSURE ENLIVE)  237 mL Oral BID BM  . furosemide  20 mg Oral QODAY  . guaiFENesin  1,200 mg Oral BID  . ipratropium-albuterol  3 mL Nebulization TID  . lisinopril  20 mg Oral Daily  . methylPREDNISolone (SOLU-MEDROL) injection  60 mg Intravenous Q12H  . mometasone-formoterol  2 puff Inhalation BID  . sodium chloride flush  3 mL Intravenous Q12H   Continuous Infusions:   Pamella Pertostin Hendryx Ricke, MD, PhD [facility] Pager [phone]  If 7PM-7AM, please contact night-coverage [email or web address] Password [email or web address] 11/30/2015, 12:23 PM

## 2015-11-30 NOTE — Progress Notes (Signed)
Bipap not needed at this time. Pt tolerating [place] well. No distress noted.

## 2015-12-01 LAB — BASIC METABOLIC PANEL
ANION GAP: 11 (ref 5–15)
BUN: 19 mg/dL (ref 6–20)
CALCIUM: 9.2 mg/dL (ref 8.9–10.3)
CO2: 32 mmol/L (ref 22–32)
Chloride: 91 mmol/L — ABNORMAL LOW (ref 101–111)
Creatinine, Ser: 0.64 mg/dL (ref 0.44–1.00)
GLUCOSE: 119 mg/dL — AB (ref 65–99)
Potassium: 4.5 mmol/L (ref 3.5–5.1)
Sodium: 134 mmol/L — ABNORMAL LOW (ref 135–145)

## 2015-12-01 MED ORDER — PREDNISONE 20 MG PO TABS
20.0000 mg | ORAL_TABLET | Freq: Once | ORAL | Status: AC
  Administered 2015-12-01: 20 mg via ORAL
  Filled 2015-12-01: qty 1

## 2015-12-01 MED ORDER — PREDNISONE 20 MG PO TABS
40.0000 mg | ORAL_TABLET | Freq: Every day | ORAL | Status: DC
  Administered 2015-12-02: 40 mg via ORAL
  Filled 2015-12-01: qty 2

## 2015-12-01 MED ORDER — ENOXAPARIN SODIUM 40 MG/0.4ML ~~LOC~~ SOLN
40.0000 mg | SUBCUTANEOUS | Status: DC
  Administered 2015-12-01: 40 mg via SUBCUTANEOUS
  Filled 2015-12-01: qty 0.4

## 2015-12-01 NOTE — Progress Notes (Signed)
Name: Kaitlyn MoralesFrances L Ibarra MRN: [id number] DOB: September 23, 1945    ADMISSION DATE:  11/25/2015 CONSULTATION DATE:  12/01/2015  REFERRING MD :  Elvera LennoxGherghe  CHIEF COMPLAINT:  Respiratory distress  HISTORY OF PRESENT ILLNESS:  70 year old smoker with COPD on 2 L home oxygen during sleep/exertion admitted on 3/6 for worsening respiratory distress and treated for COPD exacerbation with steroids and bronchodilators. We are consulted today since there is no improvement over the last 4 days. She sees my partner Dr. Maple HudsonYoung and was last seen on 3/3 in the office when she was given a Solu-Medrol shot. She quit smoking in 06/2015. PFTs in 06/2011 show FEV1 of 33% consistent with severe airway obstruction and hyperinflation and air trapping consistent with emphysema She is maintained on her home regimen of Symbicort and DuoNeb's. She has also been using Combivent in addition and admits to having severe tremors. Medication review also shows lisinopril She reports dry cough and denies sputum production There is no chest pain or palpitations  SIGNIFICANT EVENTS    STUDIES:  CT angios 3/8-negative PE, advanced emphysema 3/10 echo -normal LV function, no evidence of pulmonary hypertension  CT chest reviewed by me.- Severe emphysema  SUBJECTIVE:  Anxious with changes of routine.   VITAL SIGNS: Temp:  [97.6 F (36.4 C)-98.1 F (36.7 C)] 98.1 F (36.7 C) (03/12 0500) Pulse Rate:  [83-92] 92 (03/12 0500) Resp:  [15-22] 22 (03/12 0500) BP: (137-156)/(63-71) 137/71 mmHg (03/12 0500) SpO2:  [98 %-99 %] 98 % (03/12 0500) Weight:  [54.522 kg (120 lb 3.2 oz)] 54.522 kg (120 lb 3.2 oz) (03/12 0500)  PHYSICAL EXAMINATION: Gen. Pleasant, thin chronically ill, in mild distress, anxious affect, talkative ENT - no lesions, no post nasal drip Neck: No JVD, no thyromegaly, no carotid bruits Lungs: no use of accessory muscles, decreased without rales, very distant Cardiovascular: Rhythm regular, heart sounds  normal, no  murmurs, no peripheral edema Abdomen: soft and non-tender, no hepatosplenomegaly, BS normal. Musculoskeletal: No deformities, no cyanosis or clubbing Neuro:  alert, non focal, tremors minimal Skin:  Warm, no lesions/ rash     Recent Labs Lab 11/29/15 0438 11/30/15 0533 12/01/15 0644  NA 135 134* 134*  K 3.7 4.3 4.5  CL 95* 95* 91*  CO2 28 28 32  BUN 10 16 19   CREATININE 0.57 0.53 0.64  GLUCOSE 129* 122* 119*    Recent Labs Lab 11/26/15 0051 11/28/15 0830 11/29/15 0438  HGB 10.9* 12.2 12.0  HCT 34.2* 35.9* 37.2  WBC 6.2 10.7* 8.3  PLT 273 293 294   No results found.  ASSESSMENT / PLAN:  Advanced emphysema COPD exacerbation Concern for bronchodilator toxicity  She was getting too much bronchodilator leading up to admission,  as evidenced by tremors and tachycardia and her extreme anxiety. Meds are reviewed with her at every office visit. As she gets dyspneic, she increases use, trying to avoid hospitalization. Would decrease Combivent rescue inhaler to 3-4 times daily, Emphasize use of either/ or neb/DuoNeb or Combivent as rescue med on discharge. She does not need Spiriva-since she is already on ipratropium.  She tries to minimize, but is oxygen dependent most of the time, especially with exertion for trips away from home. She had portable concentrator but felt confined by battery life and chose to use portable tanks.  Ok to change to prednisone  Would agree with alprazolam 0.5  mg twice daily for now given severe anxiety until her tremors decrease-not sure if she needs this long-term  We can continue  dulera in the hospital and switched back to Symbicort at discharge  She was upset about change from lisinopril without discussion and happier with resumption. Discussed potential for dry cough.  Now approaching baseline. Encourage mobilization. Home soon.  Terisa Starr,  MD [facility] Pager [phone] If no response call [phone]     12/01/2015, 9:02 AM

## 2015-12-01 NOTE — Progress Notes (Signed)
PROGRESS NOTE  CARALINE DEUTSCHMAN ZOX:[id number] DOB: 02-13-1946 DOA: 11/25/2015 PCP: [name], MD Outpatient Specialists:    LOS: 6 days   Brief Narrative: Patient is a 70 year old female with history of COPD, not on home oxygen presented with worsening shortness of breath and COPD exacerbation.  Assessment & Plan: Active Problems:   Essential hypertension   COPD with exacerbation (HCC)   Acute respiratory failure (HCC)   Acute on chronic respiratory failure with hypoxia (HCC)   Anxiety   Dependent edema   Tachycardia   Acute on chronic respiratory failure with hypoxia (HCC) due to COPD with exacerbation (HCC) - Still diffusely wheezing however appears improved - transition IV steroids to prednisone today  - Continue Dulera.  - wean O2 as tolerated - CTA chest with no evidence of acute PE, advanced changes of emphysema - 2-D echo with normal EF - appreciate pulmonary input  RT, please DO NOT change anymore patient's current regimen.  Essential hypertension - Continue current home regimen, currently stable  Anxiety - Continue Xanax.  SinusTachycardia - Improved    DVT prophylaxis: Lovenox Code Status: Full Family Communication: no family bedside Disposition Plan: home when ready, likely 1-2 days Barriers for discharge: respiratory status  Consultants:   Pulmonology  Procedures:   2D echo  Antimicrobials:  Doxycycline 3/7 >>   Levaquin 1 dose on 3/7  Subjective: - walked around the unit last night, she is feeling improved.   Objective: Filed Vitals:   11/30/15 1538 11/30/15 2053 11/30/15 2100 12/01/15 0500  BP: 156/68  143/63 137/71  Pulse:   83 92  Temp: 97.8 F (36.6 C)  97.6 F (36.4 C) 98.1 F (36.7 C)  TempSrc: Oral     Resp: Height:      Weight:    54.522 kg (120 lb 3.2 oz)  SpO2: 98% 99% 98% 98%    Intake/Output Summary (Last 24 hours) at 12/01/15 1201 Last data filed at 12/01/15 1000  Gross per 24 hour  Intake     920 ml  Output   1400 ml  Net   -480 ml   Filed Weights   11/29/15 0334 11/30/15 0503 12/01/15 0500  Weight: 56.473 kg (124 lb 8 oz) 55.5 kg (122 lb 5.7 oz) 54.522 kg (120 lb 3.2 oz)   Examination: BP 137/71 mmHg  Pulse 92  Temp(Src) 98.1 F (36.7 C) (Oral)  Resp 22  Ht  (1.549 m)  Wt 54.522 kg (120 lb 3.2 oz)  BMI 22.72 kg/m2  SpO2 98% General exam: NAD HEENT: PERRLA, EOMI, Anicteric Sclera, mucous membranes moist. Respiratory system: no wheezing Cardiovascular system: regular rate and rhythm, no murmurs, gallops. No JVD. No peripheral edema.  Gastrointestinal system: Abdomen is nondistended, soft and nontender. Normal bowel sounds heard. Central nervous system: AxOx3. No focal deficits  Data Reviewed: I have personally reviewed following labs and imaging studies  CBC:  Recent Labs Lab 11/25/15 1036 11/26/15 0051 11/28/15 0830 11/29/15 0438  WBC 7.6 6.2 10.7* 8.3  NEUTROABS 5.1  --   --   --   HGB 12.4 10.9* 12.2 12.0  HCT 38.1 34.2* 35.9* 37.2  MCV 88.0 88.1 86.5 86.9  PLT 304 273 293 294   Basic Metabolic Panel:  Recent Labs Lab 11/25/15 1516 11/26/15 0051 11/28/15 0830 11/29/15 0438 11/30/15 0533 12/01/15 0644  NA  --  137 140 135 134* 134*  K  --  4.8 4.6 3.7 4.3 4.5  CL  --  103 99* 95* 95* 91*  CO2  --  22 27 28 28  32  GLUCOSE  --  140* 135* 129* 122* 119*  BUN  --  10 10 10 16 19   CREATININE  --  0.65 0.62 0.57 0.53 0.64  CALCIUM  --  8.5* 9.6 9.1 9.1 9.2  MG 2.4  --   --   --   --   --   PHOS 3.4  --   --   --   --   --    GFR: Estimated Creatinine Clearance: 49.4 mL/min (by C-G formula based on Cr of 0.64). Liver Function Tests:  Recent Labs Lab 11/26/15 0051  AST 25  ALT 17  ALKPHOS 45  BILITOT 0.4  PROT 5.7*  ALBUMIN 3.4*   BNP (last 3 results)  Recent Labs  12/12/14 1006  PROBNP 33.0    Recent Results (from the past 240 hour(s))  MRSA PCR Screening     Status: None   Collection Time: 11/25/15  6:47 PM    Result Value Ref Range Status   MRSA by PCR NEGATIVE NEGATIVE Final    Comment:        The GeneXpert MRSA Assay (FDA approved for NASAL specimens only), is one component of a comprehensive MRSA colonization surveillance program. It is not intended to diagnose MRSA infection nor to guide or monitor treatment for MRSA infections.       Radiology Studies: No results found.   Scheduled Meds: . aspirin EC  81 mg Oral QODAY  . doxycycline  100 mg Oral Q12H  . enoxaparin (LOVENOX) injection  40 mg Subcutaneous Q24H  . feeding supplement (ENSURE ENLIVE)  237 mL Oral BID BM  . furosemide  20 mg Oral QODAY  . guaiFENesin  1,200 mg Oral BID  . ipratropium-albuterol  3 mL Nebulization TID  . lisinopril  20 mg Oral Daily  . methylPREDNISolone (SOLU-MEDROL) injection  60 mg Intravenous Q12H  . mometasone-formoterol  2 puff Inhalation BID  . sodium chloride flush  3 mL Intravenous Q12H   Continuous Infusions:   Pamella Pertostin Gherghe, MD, PhD [facility] Pager [phone]  If 7PM-7AM, please contact night-coverage [email or web address] Password [email or web address] 12/01/2015, 12:01 PM

## 2015-12-02 ENCOUNTER — Telehealth: Payer: Self-pay | Admitting: Internal Medicine

## 2015-12-02 DIAGNOSIS — J9601 Acute respiratory failure with hypoxia: Secondary | ICD-10-CM

## 2015-12-02 MED ORDER — FUROSEMIDE 20 MG PO TABS: 20.0000 mg | ORAL_TABLET | Freq: Every day | ORAL | Status: DC | PRN

## 2015-12-02 MED ORDER — ALBUTEROL SULFATE HFA 108 (90 BASE) MCG/ACT IN AERS
2.0000 | INHALATION_SPRAY | Freq: Four times a day (QID) | RESPIRATORY_TRACT | Status: AC | PRN
Start: 2015-12-02 — End: ?

## 2015-12-02 MED ORDER — GUAIFENESIN-DM 100-10 MG/5ML PO SYRP: 5.0000 mL | ORAL_SOLUTION | ORAL | Status: DC | PRN

## 2015-12-02 MED ORDER — PREDNISONE 10 MG PO TABS: 10.0000 mg | ORAL_TABLET | Freq: Every day | ORAL | Status: DC

## 2015-12-02 NOTE — Progress Notes (Signed)
Nutrition Follow-up  DOCUMENTATION CODES:   Not applicable  INTERVENTION:   Continue Ensure Enlive PO BID, each supplement provides 350 kcal and 20 grams of protein  NUTRITION DIAGNOSIS:   Inadequate oral intake related to poor appetite as evidenced by per patient/family report.  Resolved   GOAL:   Patient will meet greater than or equal to 90% of their needs  Met   MONITOR:   PO intake, Supplement acceptance, I & O's, Labs, Weight trends  ASSESSMENT:   70 y.o. female admitted with SOB, COPD exacerbation.   Patient with improved appetite and intake, consuming 75-100% of meals. Hopeful for d/c home soon.  Diet Order:  Diet Heart Room service appropriate?: Yes; Fluid consistency:: Thin; Fluid restriction:: 1800 mL Fluid  Skin:  Reviewed, no issues  Last BM:  3/10  Height:   Ht Readings from Last 1 Encounters:  11/25/15 '5\' 1"'$  (1.549 m)    Weight:   Wt Readings from Last 1 Encounters:  12/02/15 121 lb 9.6 oz (55.157 kg)    Ideal Body Weight:  47.7 kg  BMI:  Body mass index is 22.99 kg/(m^2).  Estimated Nutritional Needs:   Kcal:  1400-1600  Protein:  65-75 gm  Fluid:  1.4-1.6 L  EDUCATION NEEDS:   Education needs addressed  Molli Barrows, [place], [place], [place] Pager [phone] After Hours Pager [phone]

## 2015-12-02 NOTE — Care Management Important Message (Signed)
Important Message  Patient Details  Name: Kaitlyn MoralesFrances L Judon MRN: [id number] Date of Birth: 1946-07-30   Medicare Important Message Given:  Yes    Kyla BalzarineShealy, [place] 12/02/2015, 9:53 AM

## 2015-12-02 NOTE — Progress Notes (Signed)
Patient discharge instructions gone over with patient in detail. All questions answered to patient's satisfaction. Telemetry discontinued, IV removed intact. Patient [facility], by way of wheelchair.

## 2015-12-02 NOTE — Progress Notes (Signed)
Physical Therapy Treatment Patient Details Name: [name] MRN: [id number] DOB: 1945-12-14 Today's Date: 12/02/2015    History of Present Illness Pt is a 70 y/o F admitted for acute on chornic respiratory failure w/ hypoxia due to COPD exacerbation.  Pt's PMH includes anxiety.    PT Comments    [name] made good progress today and was able to tolerate high level balance activities w/o instability today.  SpO2 down to 89% on 2L O2 non sustained while ambulating.    Follow Up Recommendations  No PT follow up;Supervision - Intermittent;Other (comment) (Cardiopulmonary Rehab)     Equipment Recommendations  None recommended by PT    Recommendations for Other Services       Precautions / Restrictions Precautions Precaution Comments: watch O2 Restrictions Weight Bearing Restrictions: No    Mobility  Bed Mobility Overal bed mobility: Independent             General bed mobility comments: no cues or physical assist needed  Transfers Overall transfer level: Needs assistance Equipment used: None Transfers: Sit to/from Stand Sit to Stand: Modified independent (Device/Increase time)         General transfer comment: No cues or physical assist needed  Ambulation/Gait Ambulation/Gait assistance: Supervision Ambulation Distance (Feet): 400 Feet Assistive device: None Gait Pattern/deviations: Step-through pattern   Gait velocity interpretation: Below normal speed for age/gender General Gait Details: No instability w/ high level balance actvities listed below.  SpO2 down to 89% non sustained on 2L O2.  Two standing rest breaks due to dyspnea.   Stairs            Wheelchair Mobility    Modified Rankin (Stroke Patients Only)       Balance Overall balance assessment: Needs assistance Sitting-balance support: Feet supported;No upper extremity supported Sitting balance-Leahy Scale: Normal     Standing balance support: No upper extremity  supported;During functional activity Standing balance-Leahy Scale: Good               High level balance activites: Side stepping;Backward walking;Direction changes;Turns;Head turns;Other (comment) (stepping over object) High Level Balance Comments: No instability w/ high level balance activties listed above    Cognition Arousal/Alertness: Awake/alert Behavior During Therapy: WFL for tasks assessed/performed Overall Cognitive Status: Within Functional Limits for tasks assessed                      Exercises      General Comments        Pertinent Vitals/Pain Pain Assessment: No/denies pain    Home Living                      Prior Function            PT Goals (current goals can now be found in the care plan section) Acute Rehab PT Goals Patient Stated Goal: to go home today PT Goal Formulation: With patient Time For Goal Achievement: 12/10/15 Potential to Achieve Goals: Good Progress towards PT goals: Progressing toward goals    Frequency  Min 3X/week    PT Plan Current plan remains appropriate    Co-evaluation             End of Session Equipment Utilized During Treatment: Oxygen;Gait belt Activity Tolerance: Patient tolerated treatment well;Other (comment) (dyspnea) Patient left: with call bell/phone within reach;in bed (sitting EOB to eat lunch)     Time: 4098-1191 PT Time Calculation (min) (ACUTE ONLY): 15 min  Charges:  $Gait  Training: 8-22 mins                    G Codes:      Encarnacion ChuAshley Arcelia Pals PT, DPT  Pager: [phone] Phone: [phone] 12/02/2015, 12:26 PM

## 2015-12-02 NOTE — Telephone Encounter (Signed)
LMTCB x 1 

## 2015-12-02 NOTE — Discharge Instructions (Signed)
Follow with BADGER,MICHAEL C, MD in 5-7 days  Please get a complete blood count and chemistry panel checked by your Primary MD at your next visit, and again as instructed by your Primary MD. Please get your medications reviewed and adjusted by your Primary MD.  Please request your Primary MD to go over all Hospital Tests and Procedure/Radiological results at the follow up, please get all Hospital records sent to your Prim MD by signing hospital release before you go home.  If you had Pneumonia of Lung problems at the Hospital: Please get a 2 view Chest X ray done in 6-8 weeks after hospital discharge or sooner if instructed by your Primary MD.  If you have Congestive Heart Failure: Please call your Cardiologist or Primary MD anytime you have any of the following symptoms:  1) 3 pound weight gain in 24 hours or 5 pounds in 1 week  2) shortness of breath, with or without a dry hacking cough  3) swelling in the hands, feet or stomach  4) if you have to sleep on extra pillows at night in order to breathe  Follow cardiac low salt diet and 1.5 lit/day fluid restriction.  If you have diabetes Accuchecks 4 times/day, Once in AM empty stomach and then before each meal. Log in all results and show them to your primary doctor at your next visit. If any glucose reading is under 80 or above 300 call your primary MD immediately.  If you have Seizure/Convulsions/Epilepsy: Please do not drive, operate heavy machinery, participate in activities at heights or participate in high speed sports until you have seen by Primary MD or a Neurologist and advised to do so again.  If you had Gastrointestinal Bleeding: Please ask your Primary MD to check a complete blood count within one week of discharge or at your next visit. Your endoscopic/colonoscopic biopsies that are pending at the time of discharge, will also need to followed by your Primary MD.  Get Medicines reviewed and adjusted. Please take all your  medications with you for your next visit with your Primary MD  Please request your Primary MD to go over all hospital tests and procedure/radiological results at the follow up, please ask your Primary MD to get all Hospital records sent to his/her office.  If you experience worsening of your admission symptoms, develop shortness of breath, life threatening emergency, suicidal or homicidal thoughts you must seek medical attention immediately by calling 911 or calling your MD immediately  if symptoms less severe.  You must read complete instructions/literature along with all the possible adverse reactions/side effects for all the Medicines you take and that have been prescribed to you. Take any new Medicines after you have completely understood and accpet all the possible adverse reactions/side effects.   Do not drive or operate heavy machinery when taking Pain medications.   Do not take more than prescribed Pain, Sleep and Anxiety Medications  Special Instructions: If you have smoked or chewed Tobacco  in the last 2 yrs please stop smoking, stop any regular Alcohol  and or any Recreational drug use.  Wear Seat belts while driving.  Please note You were cared for by a hospitalist during your hospital stay. If you have any questions about your discharge medications or the care you received while you were in the hospital after you are discharged, you can call the unit and asked to speak with the hospitalist on call if the hospitalist that took care of you is not available. Once  you are discharged, your primary care physician will handle any further medical issues. Please note that NO REFILLS for any discharge medications will be authorized once you are discharged, as it is imperative that you return to your primary care physician (or establish a relationship with a primary care physician if you do not have one) for your aftercare needs so that they can reassess your need for medications and monitor your  lab values.  You can reach the hospitalist office at phone [phone] or fax [phone]   If you do not have a primary care physician, you can call [phone] for a physician referral.  Activity: As tolerated with Full fall precautions use walker/cane & assistance as needed  Diet: regular  Disposition Home

## 2015-12-02 NOTE — Progress Notes (Signed)
Name: [name] MRN: [id number] DOB: November 11, 1945    ADMISSION DATE:  11/25/2015 CONSULTATION DATE:  12/02/2015  REFERRING MD :  Elvera LennoxGherghe  CHIEF COMPLAINT:  Respiratory distress  HISTORY OF PRESENT ILLNESS:  70 year old smoker with COPD on 2 L home oxygen during sleep/exertion admitted on 3/6 for worsening respiratory distress and treated for COPD exacerbation with steroids and bronchodilators. We are consulted today since there is no improvement over the last 4 days. She sees my partner Dr. Maple HudsonYoung and was last seen on 3/3 in the office when she was given a Solu-Medrol shot. She quit smoking in 06/2015. PFTs in 06/2011 show FEV1 of 33% consistent with severe airway obstruction and hyperinflation and air trapping consistent with emphysema She is maintained on her home regimen of Symbicort and DuoNeb's. She has also been using Combivent in addition and admits to having severe tremors. Medication review also shows lisinopril She reports dry cough and denies sputum production There is no chest pain or palpitations  SIGNIFICANT EVENTS    STUDIES:  CT angios 3/8-negative PE, advanced emphysema 3/10 echo -normal LV function, no evidence of pulmonary hypertension  I reviewed chest CT myself, severe emphysema noted.  SUBJECTIVE:  Anxious with changes of routine.   VITAL SIGNS: Temp:  [97.4 F (36.3 C)-97.7 F (36.5 C)] 97.5 F (36.4 C) (03/13 0500) Pulse Rate:  [79-93] 81 (03/13 0500) Resp:  [19-24] 22 (03/13 0500) BP: (143-155)/(55-63) 150/59 mmHg (03/13 0500) SpO2:  [95 %-100 %] 98 % (03/13 0500) Weight:  [55.157 kg (121 lb 9.6 oz)] 55.157 kg (121 lb 9.6 oz) (03/13 0500)  PHYSICAL EXAMINATION: Gen. Pleasant, thin chronically ill, in mild distress, anxious affect, talkative ENT - no lesions, no post nasal drip Neck: No JVD, no thyromegaly, no carotid bruits Lungs: no use of accessory muscles, decreased without rales, very distant Cardiovascular: Rhythm regular, heart sounds   normal, no murmurs, no peripheral edema Abdomen: soft and non-tender, no hepatosplenomegaly, BS normal. Musculoskeletal: No deformities, no cyanosis or clubbing Neuro:  alert, non focal, tremors minimal Skin:  Warm, no lesions/ rash  Recent Labs Lab 11/29/15 0438 11/30/15 0533 12/01/15 0644  NA 135 134* 134*  K 3.7 4.3 4.5  CL 95* 95* 91*  CO2 28 28 32  BUN 10 16 19   CREATININE 0.57 0.53 0.64  GLUCOSE 129* 122* 119*    Recent Labs Lab 11/26/15 0051 11/28/15 0830 11/29/15 0438  HGB 10.9* 12.2 12.0  HCT 34.2* 35.9* 37.2  WBC 6.2 10.7* 8.3  PLT 273 293 294   No results found.  ASSESSMENT / PLAN:  Advanced emphysema COPD exacerbation Concern for bronchodilator toxicity  She was getting too much bronchodilator leading up to admission,  as evidenced by tremors and tachycardia and her extreme anxiety. Meds are reviewed with her at every office visit. As she gets dyspneic, she increases use, trying to avoid hospitalization. Would decrease Combivent rescue inhaler to 3-4 times daily, Emphasize use of either/ or neb/DuoNeb or Combivent as rescue med on discharge. She does not need Spiriva-since she is already on ipratropium.  She tries to minimize, but is oxygen dependent most of the time, especially with exertion for trips away from home. She had portable concentrator but felt confined by battery life and chose to use portable tanks.  PO prednisone with taper.  Alprazolam 0.5 mg twice daily for now given severe anxiety until her tremors decrease - not sure if she needs this long-term.  We can continue dulera in the hospital and  switched back to Symbicort at discharge  [facility] to discharge from pulmonary standpoint, f/u with Dr. Maple Hudson in 2 wks.  Alyson Reedy, M.D. [facility] Pulmonary/Critical Care Medicine. Pager: [phone]. After hours pager: [phone].  12/02/2015, 10:29 AM

## 2015-12-02 NOTE — Telephone Encounter (Signed)
Pt is needing HFU in 2 weeks. Can pt see one of the NP's? Please advise Dr. Maple HudsonYoung thanks

## 2015-12-02 NOTE — Telephone Encounter (Signed)
OK NP

## 2015-12-03 NOTE — Discharge Summary (Signed)
Physician Discharge Summary  EMRI SAMPLE MWU:[id number] DOB: 02/23/1946 DOA: 11/25/2015  PCP: [name], MD  Admit date: 11/25/2015 Discharge date: 12/03/2015  Time spent: > 30 minutes  Recommendations for Outpatient Follow-up:  1. Follow-up with Dr. Maple Hudson from pulmonology as scheduled  2. Continue prednisone taper as prescribed  Discharge Diagnoses:  Active Problems:   Essential hypertension   COPD with exacerbation (HCC)   Acute respiratory failure (HCC)   Acute on chronic respiratory failure with hypoxia (HCC)   Anxiety   Dependent edema   Tachycardia  Discharge Condition: stable  Diet recommendation: regular  Filed Weights   11/30/15 0503 12/01/15 0500 12/02/15 0500  Weight: 55.5 kg (122 lb 5.7 oz) 54.522 kg (120 lb 3.2 oz) 55.157 kg (121 lb 9.6 oz)    History of present illness:  See H&P, Labs, Consult and Test reports for all details in brief, patient is a 70 year old female with history of COPD, not on home oxygen presented with worsening shortness of breath and COPD exacerbation.  Hospital Course:  Acute on chronic respiratory failure with hypoxia (HCC) due to COPD with exacerbation (HCC) - patient was placed on scheduled breathing treatments, IV steroids as well as antibiotics. Given lack of improvement in the initial 3-4 days pulmonology was consulted and have followed patient while hospitalized. She eventually turn around, and was feeling back to baseline on the day of discharge, able to ambulate in the hallway as well as in the room with minimal dyspnea which is not unusual for her. She underwent a CT angiogram which showed no evidence of acute pulmonary embolus however it did show advanced changes of emphysema, she also underwent a 2-D echo which showed normal ejection fraction. She will have close follow-up with pulmonology as an outpatient. She finished an antibiotic course will hospitalized, she will be discharged on a prednisone taper as  below. Essential hypertension - Continue current home regimen, currently stable Anxiety - Continue Xanax. SinusTachycardia - Improved   Procedures:  None   Consultations:  Pulmonology   Discharge Exam: Filed Vitals:   12/01/15 2000 12/01/15 2001 12/01/15 2005 12/02/15 0500  BP: 143/63   150/59  Pulse: 79   81  Temp: 97.4 F (36.3 C)   97.5 F (36.4 C)  TempSrc:      Resp: 19   22  Height:      Weight:    55.157 kg (121 lb 9.6 oz)  SpO2: 95% 98% 99% 98%   General: NAD Cardiovascular: RRR Respiratory: CTA biL, no wheezing  Discharge Instructions Activity:  As tolerated   Get Medicines reviewed and adjusted: Please take all your medications with you for your next visit with your Primary MD  Please request your Primary MD to go over all hospital tests and procedure/radiological results at the follow up, please ask your Primary MD to get all Hospital records sent to his/her office.  If you experience worsening of your admission symptoms, develop shortness of breath, life threatening emergency, suicidal or homicidal thoughts you must seek medical attention immediately by calling 911 or calling your MD immediately if symptoms less severe.  You must read complete instructions/literature along with all the possible adverse reactions/side effects for all the Medicines you take and that have been prescribed to you. Take any new Medicines after you have completely understood and accpet all the possible adverse reactions/side effects.   Do not drive when taking Pain medications.   Do not take more than prescribed Pain, Sleep and Anxiety Medications  Special Instructions: If you have smoked or chewed Tobacco in the last 2 yrs please stop smoking, stop any regular Alcohol and or any Recreational drug use.  Wear Seat belts while driving.  Please note  You were cared for by a hospitalist during your hospital stay. Once you are discharged, your primary care physician will  handle any further medical issues. Please note that NO REFILLS for any discharge medications will be authorized once you are discharged, as it is imperative that you return to your primary care physician (or establish a relationship with a primary care physician if you do not have one) for your aftercare needs so that they can reassess your need for medications and monitor your lab values.    Medication List    STOP taking these medications        guaiFENesin 600 MG 12 hr tablet  Commonly known as:  MUCINEX      TAKE these medications        albuterol 108 (90 Base) MCG/ACT inhaler  Commonly known as:  PROVENTIL HFA;VENTOLIN HFA  Inhale 2 puffs into the lungs every 6 (six) hours as needed for wheezing or shortness of breath. Please dispense generic.     ALPRAZolam 0.5 MG tablet  Commonly known as:  XANAX  TAKE 1 TABLET THREE TIMES DAILY AS NEEDED FOR ANXIETY  OR  SLEEP     aspirin 81 MG tablet  Take 81 mg by mouth every other day. Take one tablet every other day     budesonide-formoterol 160-4.5 MCG/ACT inhaler  Commonly known as:  SYMBICORT  2 puffs then rinse mouth, twice daily     furosemide 20 MG tablet  Commonly known as:  LASIX  Take 1 tablet (20 mg total) by mouth daily as needed.     guaiFENesin-dextromethorphan 100-10 MG/5ML syrup  Commonly known as:  ROBITUSSIN DM  Take 5 mLs by mouth every 4 (four) hours as needed for cough.     ipratropium-albuterol 0.5-2.5 (3) MG/3ML Soln  Commonly known as:  DUONEB  USE ONE VIAL VIA NEBULIZER 2 TIMES DAILY     Ipratropium-Albuterol 20-100 MCG/ACT Aers respimat  Commonly known as:  COMBIVENT  Inhale 1 puff into the lungs every 6 (six) hours.     lisinopril 20 MG tablet  Commonly known as:  PRINIVIL,ZESTRIL  Take 20 mg by mouth daily.     POTASSIUM GLUCONATE PO  Take 1 tablet by mouth daily. Patient gets this medication over the counter -dose unknown     predniSONE 10 MG tablet  Commonly known as:  DELTASONE  Take 1  tablet (10 mg total) by mouth daily with breakfast. 4 X 2 DAYS, 3 X 2 DAYS, 2 X 2 DAYS, 1 X 2 DAYS     PRESERVISION AREDS 2 PO  Take 2 capsules by mouth daily. Reported on 11/22/2015           Follow-up Information    Follow up with Waymon BudgeYOUNG,CLINTON D, MD.   Specialty:  Pulmonary Disease   Why:  office will call you. call them if you haven't heart in 2 days   Contact information:   [address] [place] [address] [phone]       The results of significant diagnostics from this hospitalization (including imaging, microbiology, ancillary and laboratory) are listed below for reference.    Significant Diagnostic Studies: Ct Angio Chest Pe W/cm &/or Wo Cm  11/27/2015  CLINICAL DATA:  Shortness of breath for 2 weeks. EXAM:  CT ANGIOGRAPHY CHEST WITH CONTRAST TECHNIQUE: Multidetector CT imaging of the chest was performed using the standard protocol during bolus administration of intravenous contrast. Multiplanar CT image reconstructions and MIPs were obtained to evaluate the vascular anatomy. CONTRAST:  80mL OMNIPAQUE IOHEXOL 350 MG/ML SOLN COMPARISON:  10/08/2011 FINDINGS: Mediastinum/Lymph Nodes: No pulmonary emboli or thoracic aortic dissection identified. No masses or pathologically enlarged lymph nodes identified. Aortic atherosclerosis noted. Lungs/Pleura: No pleural fluid. Moderate to advanced changes of centrilobular and paraseptal emphysema. No airspace consolidation or atelectasis. Upper abdomen: No acute findings. Musculoskeletal: No chest wall mass or suspicious bone lesions identified. Review of the MIP images confirms the above findings. IMPRESSION: 1. No evidence for acute pulmonary embolus. 2. Advanced changes of emphysema. Electronically Signed   By: Signa Kell M.D.   On: 11/27/2015 09:30   Dg Chest Port 1 View  11/25/2015  CLINICAL DATA:  Shortness of Breath EXAM: PORTABLE CHEST 1 VIEW COMPARISON:  08/20/2015 FINDINGS: Cardiac shadow is within normal limits. The lungs are  well aerated bilaterally. Old rib fractures are noted on the left. No acute abnormality is seen. IMPRESSION: No acute abnormality noted. Electronically Signed   By: Alcide Clever M.D.   On: 11/25/2015 10:43    Microbiology: Recent Results (from the past 240 hour(s))  MRSA PCR Screening     Status: None   Collection Time: 11/25/15  6:47 PM  Result Value Ref Range Status   MRSA by PCR NEGATIVE NEGATIVE Final    Comment:        The GeneXpert MRSA Assay (FDA approved for NASAL specimens only), is one component of a comprehensive MRSA colonization surveillance program. It is not intended to diagnose MRSA infection nor to guide or monitor treatment for MRSA infections.     Labs: Basic Metabolic Panel:  Recent Labs Lab 11/28/15 0830 11/29/15 0438 11/30/15 0533 12/01/15 0644  NA 140 135 134* 134*  K 4.6 3.7 4.3 4.5  CL 99* 95* 95* 91*  CO2 32  GLUCOSE 135* 129* 122* 119*  BUN CREATININE 0.62 0.57 0.53 0.64  CALCIUM 9.6 9.1 9.1 9.2   CBC:  Recent Labs Lab 11/28/15 0830 11/29/15 0438  WBC 10.7* 8.3  HGB 12.2 12.0  HCT 35.9* 37.2  MCV 86.5 86.9  PLT 293 294   BNP: BNP (last 3 results)  Recent Labs  03/13/15 0846 11/25/15 1036  BNP 23.0 16.7    ProBNP (last 3 results)  Recent Labs  12/12/14 1006  PROBNP 33.0    Signed:  Vincenza Dail  [facility] 12/03/2015, 11:18 AM

## 2015-12-03 NOTE — Telephone Encounter (Signed)
Spoke with pt. She has been scheduled to see TP on 12/18/15 at 10:15am.

## 2015-12-04 ENCOUNTER — Telehealth: Payer: Self-pay | Admitting: Internal Medicine

## 2015-12-04 DIAGNOSIS — R0902 Hypoxemia: Secondary | ICD-10-CM

## 2015-12-04 DIAGNOSIS — J449 Chronic obstructive pulmonary disease, unspecified: Secondary | ICD-10-CM

## 2015-12-04 NOTE — Telephone Encounter (Signed)
Pt returned call - [phone]

## 2015-12-04 NOTE — Telephone Encounter (Signed)
lmtcb x1 for pt. 

## 2015-12-04 NOTE — Telephone Encounter (Signed)
Spoke with pt, states that before she was released from hospital on Monday that home health nursing came by and offered to do no charge visits to pt's home and check her vitals-pt declined at the time, but now wishes to opt for these visits.  Pt wants to know how she can get this ordered for her.  CY please advise.  Thanks.

## 2015-12-05 NOTE — Telephone Encounter (Signed)
Spoke with patient, she does not remember the company.   [facility] how [facility]

## 2015-12-05 NOTE — Telephone Encounter (Signed)
Does she remember what company the home health nurse said she was with? I suspect [facility], or hospital case manager on discharge. It may be possible to [facility] and at [facility] to clarify.

## 2015-12-06 NOTE — Telephone Encounter (Signed)
Order has been placed. Called and made pt aware that order was placed. She voiced understanding and had no further questions. Nothing further needed at this time.

## 2015-12-06 NOTE — Telephone Encounter (Signed)
i think you just put in home health order and in your comments state to use thn [facility]

## 2015-12-09 ENCOUNTER — Telehealth: Payer: Self-pay | Admitting: Internal Medicine

## 2015-12-09 NOTE — Telephone Encounter (Signed)
Spoke with pt and she states that she has already spoke with Almyra FreeLibby about this and she is taking care of it  Nothing needed now per pt

## 2015-12-11 NOTE — Telephone Encounter (Signed)
[name]-Please order DME Advanced- Home Health Nurse to evaluate and treat 2 x weekly for COPD with chronic hypoxic respiratory failure   [facility]. Order has been placed and Almyra FreeLibby is aware.

## 2015-12-11 NOTE — Addendum Note (Signed)
Addended by: Ronny BaconWELCHEL, KATIE C on: 12/11/2015 09:10 AM   Modules accepted: Orders

## 2015-12-18 ENCOUNTER — Ambulatory Visit (INDEPENDENT_AMBULATORY_CARE_PROVIDER_SITE_OTHER): Payer: Medicare HMO | Admitting: Adult Health

## 2015-12-18 ENCOUNTER — Encounter: Payer: Self-pay | Admitting: Adult Health

## 2015-12-18 ENCOUNTER — Telehealth: Payer: Self-pay | Admitting: Internal Medicine

## 2015-12-18 VITALS — BP 130/82 | HR 92 | Temp 98.2°F | Ht 61.0 in | Wt 120.0 lb

## 2015-12-18 DIAGNOSIS — J441 Chronic obstructive pulmonary disease with (acute) exacerbation: Secondary | ICD-10-CM | POA: Diagnosis not present

## 2015-12-18 DIAGNOSIS — J9611 Chronic respiratory failure with hypoxia: Secondary | ICD-10-CM

## 2015-12-18 MED ORDER — ALPRAZOLAM 0.5 MG PO TABS
ORAL_TABLET | ORAL | Status: DC
Start: 2015-12-18 — End: 2016-08-12

## 2015-12-18 NOTE — Assessment & Plan Note (Signed)
Cont on O2 .  

## 2015-12-18 NOTE — Telephone Encounter (Signed)
Pt was seen in office with TP today At ov she requested a 90 day refill on her xanax. She states that her insurance case manager told her that if she used a mail order pharmacy and received a 90 day supply that the cost would come down. I explained to her that I would need to [facility] before medication could be sent.  Xanax .5mg  TAKE 1 TABLET THREE TIMES DAILY AS NEEDED FOR ANXIETY OR SLEEP #60 with 5 refills Last refilled on 06/24/15  CY please advise if okay to send 90 day supply  No Known Allergies    Current outpatient prescriptions:  .  albuterol (PROVENTIL HFA;VENTOLIN HFA) 108 (90 Base) MCG/ACT inhaler, Inhale 2 puffs into the lungs every 6 (six) hours as needed for wheezing or shortness of breath. Please dispense generic., Disp: 1 Inhaler, Rfl: 2 .  ALPRAZolam (XANAX) 0.5 MG tablet, TAKE 1 TABLET THREE TIMES DAILY AS NEEDED FOR ANXIETY  OR  SLEEP, Disp: 60 tablet, Rfl: 5 .  aspirin 81 MG tablet, Take 81 mg by mouth every other day. Take one tablet every other day, Disp: , Rfl:  .  budesonide-formoterol (SYMBICORT) 160-4.5 MCG/ACT inhaler, 2 puffs then rinse mouth, twice daily, Disp: 3 Inhaler, Rfl: 3 .  furosemide (LASIX) 20 MG tablet, Take 1 tablet (20 mg total) by mouth daily as needed., Disp: 30 tablet, Rfl: 0 .  guaiFENesin-dextromethorphan (ROBITUSSIN DM) 100-10 MG/5ML syrup, Take 5 mLs by mouth every 4 (four) hours as needed for cough., Disp: 118 mL, Rfl: 1 .  Ipratropium-Albuterol (COMBIVENT) 20-100 MCG/ACT AERS respimat, Inhale 1 puff into the lungs every 6 (six) hours., Disp: 3 Inhaler, Rfl: 3 .  ipratropium-albuterol (DUONEB) 0.5-2.5 (3) MG/3ML SOLN, USE ONE VIAL VIA NEBULIZER 2 TIMES DAILY (Patient taking differently: USE ONE VIAL VIA NEBULIZER 3 TIMES DAILY), Disp: 360 mL, Rfl: 5 .  lisinopril (PRINIVIL,ZESTRIL) 20 MG tablet, Take 20 mg by mouth daily., Disp: , Rfl:  .  Multiple Vitamins-Minerals (PRESERVISION AREDS 2 PO), Take 2 capsules by mouth  daily. Reported on 11/22/2015, Disp: , Rfl:  .  POTASSIUM GLUCONATE PO, Take 1 tablet by mouth daily. Reported on 12/18/2015, Disp: , Rfl:  .  [DISCONTINUED] amLODipine (NORVASC) 10 MG tablet, Take 1 tablet (10 mg total) by mouth daily., Disp: 30 tablet, Rfl: 3 .  [DISCONTINUED] pantoprazole (PROTONIX) 40 MG tablet, Take 1 tablet (40 mg total) by mouth 2 (two) times daily before a meal., Disp: 60 tablet, Rfl: 3

## 2015-12-18 NOTE — Telephone Encounter (Signed)
Ok to order Xanax 0.5 mg, # 180,  1 three times daily as needed for anxiety    Ref x 3

## 2015-12-18 NOTE — Patient Instructions (Addendum)
Continue on Symbicort 2 puffs Twice daily  , rinse after use.  Increase DuoNeb Three times a day  .  Continue on Oxygen 2l/m .  Discuss with primary MD that Lisinopril may cause your cough /wheezing to be worse.  follow up Dr. Maple HudsonYoung  In 2 months and As needed

## 2015-12-18 NOTE — Telephone Encounter (Signed)
Called spoke with patient and discussed CY's recs as stated below Pt voiced her understanding but did notice that CY okayed for #180 which is only a 2 month supply and requests this be changed to #270 for insurance to cover 100% from a mail order pharmacy  Discussed with CY: Okay for a 90 day supply  Called [employer] and rx was given verbally to pharmacist Joni ReiningNicole for Xanax 0.5mg  1po TID prn anxiety #270 w/ 1 refill (because this is a controlled substance, can only rx for 6 months at a time) Med list updated Patient is aware Nothing further needed; will sign off

## 2015-12-18 NOTE — Progress Notes (Signed)
Subjective:    Patient ID: [name], female    DOB: 13-Nov-1945, 70 y.o.   MRN: [id number]  HPI 70 yo female with COPD former smoker on O2   12/18/2015 Post Hospital follow up  Pt returns for a post hospital follow up .  Recent admission for COPD exacerbation , tx w/ abx and steroids.  CT chest neg for PE, advanced changes of emphsema . 2 D echo showed nml EF. She was recommended to stop ACE however this was not done . She did not understand why this was recommended and declined.  She is feeling better but still has some cough, wheezing , and dyspena.  Energy level is still low.  On Symbicort . Taking Duoneb Twice daily  .  Remains on O2 at 2l/m .   Denies chest pain, orthopnea, edema or hemoptysis .      Past Medical History  Diagnosis Date  . Chronic airway obstruction, not elsewhere classified   . Hypertension   . Ventilator dependent (HCC) 2004; 2008    "for mucous plugs"  . Pneumonia   . H/O chronic bronchitis   . Shortness of breath 10/08/11    "all the time last 1 1/2 wk"   Current Outpatient Prescriptions on File Prior to Visit  Medication Sig Dispense Refill  . albuterol (PROVENTIL HFA;VENTOLIN HFA) 108 (90 Base) MCG/ACT inhaler Inhale 2 puffs into the lungs every 6 (six) hours as needed for wheezing or shortness of breath. Please dispense generic. 1 Inhaler 2  . aspirin 81 MG tablet Take 81 mg by mouth every other day. Take one tablet every other day    . budesonide-formoterol (SYMBICORT) 160-4.5 MCG/ACT inhaler 2 puffs then rinse mouth, twice daily 3 Inhaler 3  . furosemide (LASIX) 20 MG tablet Take 1 tablet (20 mg total) by mouth daily as needed. 30 tablet 0  . guaiFENesin-dextromethorphan (ROBITUSSIN DM) 100-10 MG/5ML syrup Take 5 mLs by mouth every 4 (four) hours as needed for cough. 118 mL 1  . Ipratropium-Albuterol (COMBIVENT) 20-100 MCG/ACT AERS respimat Inhale 1 puff into the lungs every 6 (six) hours. 3 Inhaler 3  . ipratropium-albuterol (DUONEB)  0.5-2.5 (3) MG/3ML SOLN USE ONE VIAL VIA NEBULIZER 2 TIMES DAILY (Patient taking differently: USE ONE VIAL VIA NEBULIZER 3 TIMES DAILY) 360 mL 5  . lisinopril (PRINIVIL,ZESTRIL) 20 MG tablet Take 20 mg by mouth daily.    . Multiple Vitamins-Minerals (PRESERVISION AREDS 2 PO) Take 2 capsules by mouth daily. Reported on 11/22/2015    . POTASSIUM GLUCONATE PO Take 1 tablet by mouth daily. Reported on 12/18/2015    . [DISCONTINUED] amLODipine (NORVASC) 10 MG tablet Take 1 tablet (10 mg total) by mouth daily. 30 tablet 3  . [DISCONTINUED] pantoprazole (PROTONIX) 40 MG tablet Take 1 tablet (40 mg total) by mouth 2 (two) times daily before a meal. 60 tablet 3   No current facility-administered medications on file prior to visit.      Review of Systems Constitutional:   No  weight loss, night sweats,  Fevers, chills,  +fatigue, or  lassitude.  HEENT:   No headaches,  Difficulty swallowing,  Tooth/dental problems, or  Sore throat,                No sneezing, itching, ear ache, nasal congestion, post nasal drip,   CV:  No chest pain,  Orthopnea, PND, swelling in lower extremities, anasarca, dizziness, palpitations, syncope.   GI  No heartburn, indigestion, abdominal pain, nausea, vomiting, diarrhea,  change in bowel habits, loss of appetite, bloody stools.   Resp:   No chest wall deformity  Skin: no rash or lesions.  GU: no dysuria, change in color of urine, no urgency or frequency.  No flank pain, no hematuria   MS:  No joint pain or swelling.  No decreased range of motion.  No back pain.  Psych:  No change in mood or affect. No depression or anxiety.  No memory loss.         Objective:   Physical Exam  Filed Vitals:   12/18/15 1024  BP: 130/82  Pulse: 92  Temp: 98.2 F (36.8 C)  TempSrc: Oral  Height: 5\' 1"  (1.549 m)  Weight: 120 lb (54.432 kg)  SpO2: 100%   .GEN: A/Ox3; pleasant , NAD, frail and elderly   HEENT:  Vincent/AT,  EACs-clear, TMs-wnl, NOSE-clear, THROAT-clear, no  lesions, no postnasal drip or exudate noted.   NECK:  Supple w/ fair ROM; no JVD; normal carotid impulses w/o bruits; no thyromegaly or nodules palpated; no lymphadenopathy.  RESP  Decreased BS in bases no accessory muscle use, no dullness to percussion  CARD:  RRR, no m/r/g  , no peripheral edema, pulses intact, no cyanosis or clubbing.  GI:   Soft & nt; nml bowel sounds; no organomegaly or masses detected.  Musco: Warm bil, no deformities or joint swelling noted.   Neuro: alert, no focal deficits noted.    Skin: Warm, no lesions or rashes  Kaitlyn Rabel NP-C  [facility]  12/18/2015       Assessment & Plan:

## 2015-12-18 NOTE — Assessment & Plan Note (Signed)
Recent flare now resolving  If possible would change ACE to ARB as ACE inhibitor may cause/aggravate cough   Plan  Continue on Symbicort 2 puffs Twice daily  , rinse after use.  Increase DuoNeb Three times a day  .  Continue on Oxygen 2l/m .  Discuss with primary MD that Lisinopril may cause your cough /wheezing to be worse.  follow up Dr. Maple HudsonYoung  In 2 months and As needed

## 2015-12-24 ENCOUNTER — Telehealth: Payer: Self-pay | Admitting: Internal Medicine

## 2015-12-24 NOTE — Telephone Encounter (Signed)
Spoke with pt,aware of recs.  Nothing further needed.  

## 2015-12-24 NOTE — Telephone Encounter (Signed)
Just take prednisone and see how she does.

## 2015-12-24 NOTE — Telephone Encounter (Signed)
Spoke with [name][facility]- states she is having increased fatigue, sob, occasional chest pain, sometimes prod cough with clear mucus.  Pt using rescue inhalers more frequently- several times over next few days.  [name] says upper lobes are clear, lower lobes are diminished, no crackles in lungs.   Pt has a rx for prednisone taper (10mg  tabs 4 tabs X2 days, 3 tabs X2 days, 2 tabs X2 days, 1 tab X2 days) that pt started taking today.  This was prescribed by CY.  Pt uses [facility] [address]   Please call pt with recs at [phone]   Last ov: 12/18/15 with TP, 11/22/15 with CY Next ov: 03/05/16 with CY.  CY please advise on further recs.  Thanks!   No Known Allergies Current Outpatient Prescriptions on File Prior to Visit  Medication Sig Dispense Refill  . albuterol (PROVENTIL HFA;VENTOLIN HFA) 108 (90 Base) MCG/ACT inhaler Inhale 2 puffs into the lungs every 6 (six) hours as needed for wheezing or shortness of breath. Please dispense generic. 1 Inhaler 2  . ALPRAZolam (XANAX) 0.5 MG tablet Take 1 by mouth three times daily as needed for anxiety 270 tablet 1  . aspirin 81 MG tablet Take 81 mg by mouth every other day. Take one tablet every other day    . budesonide-formoterol (SYMBICORT) 160-4.5 MCG/ACT inhaler 2 puffs then rinse mouth, twice daily 3 Inhaler 3  . furosemide (LASIX) 20 MG tablet Take 1 tablet (20 mg total) by mouth daily as needed. 30 tablet 0  . guaiFENesin-dextromethorphan (ROBITUSSIN DM) 100-10 MG/5ML syrup Take 5 mLs by mouth every 4 (four) hours as needed for cough. 118 mL 1  . Ipratropium-Albuterol (COMBIVENT) 20-100 MCG/ACT AERS respimat Inhale 1 puff into the lungs every 6 (six) hours. 3 Inhaler 3  . ipratropium-albuterol (DUONEB) 0.5-2.5 (3) MG/3ML SOLN USE ONE VIAL VIA NEBULIZER 2 TIMES DAILY (Patient taking differently: USE ONE VIAL VIA NEBULIZER 3 TIMES DAILY) 360 mL 5  . lisinopril (PRINIVIL,ZESTRIL) 20 MG tablet Take 20 mg by mouth daily.    . Multiple  Vitamins-Minerals (PRESERVISION AREDS 2 PO) Take 2 capsules by mouth daily. Reported on 11/22/2015    . POTASSIUM GLUCONATE PO Take 1 tablet by mouth daily. Reported on 12/18/2015    . [DISCONTINUED] amLODipine (NORVASC) 10 MG tablet Take 1 tablet (10 mg total) by mouth daily. 30 tablet 3  . [DISCONTINUED] pantoprazole (PROTONIX) 40 MG tablet Take 1 tablet (40 mg total) by mouth 2 (two) times daily before a meal. 60 tablet 3   No current facility-administered medications on file prior to visit.

## 2016-01-21 ENCOUNTER — Telehealth: Payer: Self-pay | Admitting: Internal Medicine

## 2016-01-21 NOTE — Telephone Encounter (Signed)
Friday 01-31-16 at 11:30am-please remind patient she is being worked in and could experience longer than usual wait time. Thanks.

## 2016-01-21 NOTE — Telephone Encounter (Signed)
Florentina AddisonKatie- will I have any work-in opportunity in next week or two?

## 2016-01-21 NOTE — Telephone Encounter (Signed)
Spoke with [name], c/o increased sob-states her maintenance inhalers aren't working well for her.  Symbicort gives short relief, albuterol gives short relief, and duoneb helps some but [name] does not like doing this often.  [name] states she would like to be seen by CY only to discuss alternative meds to help with her shortness of breath.  No openings between now and her current appt. [name] uses [facility] on [address]  Last ov: 11/22/15 with CY, 12/18/15 with TP Next ov: 03/05/16  CY please advise on recs.  Thanks!   No Known Allergies Current Outpatient Prescriptions on File Prior to Visit  Medication Sig Dispense Refill  . albuterol (PROVENTIL HFA;VENTOLIN HFA) 108 (90 Base) MCG/ACT inhaler Inhale 2 puffs into the lungs every 6 (six) hours as needed for wheezing or shortness of breath. Please dispense generic. 1 Inhaler 2  . ALPRAZolam (XANAX) 0.5 MG tablet Take 1 by mouth three times daily as needed for anxiety 270 tablet 1  . aspirin 81 MG tablet Take 81 mg by mouth every other day. Take one tablet every other day    . budesonide-formoterol (SYMBICORT) 160-4.5 MCG/ACT inhaler 2 puffs then rinse mouth, twice daily 3 Inhaler 3  . furosemide (LASIX) 20 MG tablet Take 1 tablet (20 mg total) by mouth daily as needed. 30 tablet 0  . guaiFENesin-dextromethorphan (ROBITUSSIN DM) 100-10 MG/5ML syrup Take 5 mLs by mouth every 4 (four) hours as needed for cough. 118 mL 1  . Ipratropium-Albuterol (COMBIVENT) 20-100 MCG/ACT AERS respimat Inhale 1 puff into the lungs every 6 (six) hours. 3 Inhaler 3  . ipratropium-albuterol (DUONEB) 0.5-2.5 (3) MG/3ML SOLN USE ONE VIAL VIA NEBULIZER 2 TIMES DAILY (Patient taking differently: USE ONE VIAL VIA NEBULIZER 3 TIMES DAILY) 360 mL 5  . lisinopril (PRINIVIL,ZESTRIL) 20 MG tablet Take 20 mg by mouth daily.    . Multiple Vitamins-Minerals (PRESERVISION AREDS 2 PO) Take 2 capsules by mouth daily. Reported on 11/22/2015    . POTASSIUM GLUCONATE PO Take 1 tablet by mouth daily. Reported  on 12/18/2015    . [DISCONTINUED] amLODipine (NORVASC) 10 MG tablet Take 1 tablet (10 mg total) by mouth daily. 30 tablet 3  . [DISCONTINUED] pantoprazole (PROTONIX) 40 MG tablet Take 1 tablet (40 mg total) by mouth 2 (two) times daily before a meal. 60 tablet 3   No current facility-administered medications on file prior to visit.

## 2016-01-21 NOTE — Telephone Encounter (Signed)
Spoke with pt.  Appt scheduled for below appt date and time.  Pt confirmed appt and voiced no further questions or concerns at this time.

## 2016-01-31 ENCOUNTER — Encounter: Payer: Self-pay | Admitting: Internal Medicine

## 2016-01-31 ENCOUNTER — Ambulatory Visit (INDEPENDENT_AMBULATORY_CARE_PROVIDER_SITE_OTHER): Payer: Medicare HMO | Admitting: Internal Medicine

## 2016-01-31 VITALS — BP 126/70 | HR 94 | Ht 61.0 in | Wt 119.2 lb

## 2016-01-31 DIAGNOSIS — J9611 Chronic respiratory failure with hypoxia: Secondary | ICD-10-CM

## 2016-01-31 DIAGNOSIS — J449 Chronic obstructive pulmonary disease, unspecified: Secondary | ICD-10-CM | POA: Diagnosis not present

## 2016-01-31 MED ORDER — DOXYCYCLINE HYCLATE 100 MG PO TABS: 100.0000 mg | ORAL_TABLET | Freq: Two times a day (BID) | ORAL | Status: DC

## 2016-01-31 MED ORDER — PREDNISONE 10 MG PO TABS: ORAL_TABLET | ORAL | Status: DC

## 2016-01-31 NOTE — Assessment & Plan Note (Addendum)
Near baseline at this time without acute exacerbation. We discussed available medications. I suggested a trial sample of a LABA/LAMA Bevespi for comparison with Symbicort She requests a standby prescription for antibiotic and prednisone taper to keep at home.

## 2016-01-31 NOTE — Progress Notes (Signed)
Patient ID: Kaitlyn MoralesFrances L Rigg, female    DOB: 1946/05/10, 70 y.o.   MRN: [id number]  HPI  08/20/2015-70 year old female former smoker followed for COPD mixed type/Gold D, chronic hypoxic respiratory failure, complicated by anxiety, hyponatremia, HBP, aortic atherosclerosis O2 2 L/[employer] sleep/exertion FOLLOWS FOR:c/o increased sob over the last mth. gradually getting worse,cough esp. in am-thick clear,feels like going to "smother" if can't get it up,occass. wheezing,midchest tightness and pain comes and goes,not sleeping well due to sob, sleeping on 2 pillows,no fcs.Has not used Brovana samples. Flu vaccine UTD More aware of dyspnea, especially noticing nasal stuffiness in the morning, over the last month, coincident with use of indoor heat. Nebulizer used each morning without much effect. Still likes Symbicort, needing financial assistance. Graduated from pulmonary rehabilitation mid October and started [facility] the [facility] 4 days a week. Using oxygen continuously now.  11/22/2015-70 year old female former smoker followed for COPD mixed type/Gold D, chronic hypoxic respiratory failure, complicated by anxiety, hyponatremia, HBP, aortic atherosclerosis O2 2 L/[employer] sleep/exertion     daughter here Reports increased SOB for 1 week. Denies any constant chest tightness, wheezing or coughing Using neb meds more frequently. Increased dyspnea on exertion noted "off and on" but perhaps more than usual this week. Prednisone and antibiotic take around January 28, left over, made a little help only. Admits occasional cough and phlegm which is hard to clear but no fever or chills blood chest pain or frankly purulent sputum. Her portable concentrator battery doesn't last very long so she prefers portable tank oxygen. Her PCP checks labs-last sodium 134. CXR 08/20/2015-No active cardiopulmonary disease. Emphysema.  01/31/16- 70 year old female former smoker followed for COPD mixed type/Gold D, chronic  hypoxic respiratory failure, complicated by anxiety, hyponatremia, HBP, aortic atherosclerosis O2 2-4 liters/[employer] LOV 3/29-NP- post Hospital- FOLLOWS FOR: Pt continues to wear O2-states she is having a hard with SOB and wheezing. Pt has questions about her medications and what to take. She tells me she isn't really that different and is not experiencing an exacerbation. Using nebulizer once or twice daily, Symbicort twice daily and then either Ventolin or Combivent. She likes Symbicort, saying it worked better than other inhalers.  Review of Systems-see HPI Constitutional:   No-   weight loss, night sweats, fevers, chills, fatigue, lassitude. HEENT:   No-  headaches, difficulty swallowing, tooth/dental problems, sore throat,       No- sneezing, itching, no-ear ache, + nasal congestion, post nasal drip,  CV:  No- chest pain, orthopnea, PND, +swelling in lower extremities, anasarca, dizziness, palpitations.  Resp:    +shortness of breath with exertion or at rest.               + productive cough,  -productive cough,  No-  coughing up of blood.              No-  change in color of mucus.   wheezing.   Skin: No-   rash or lesions. GI:  No-   heartburn, indigestion, abdominal pain, nausea, vomiting,  GU: MS:   neck pain w/o radicular symptoms Neuro-  Psych:  No- change in mood or affect. depression or anxiety.  No memory loss.   Objective:   Physical Exam  General- Alert, Oriented, Affect+ anxious, Distress- none acute, conversational on O2, + very frail Skin- + steroid ecchymoses on forearms Lymphadenopathy- none Head- atraumatic            Eyes- Gross vision intact, PERRLA, conjunctivae clear secretions  Ears- Hearing, canals-normal            Nose-  no-Septal dev,  polyps, erosion, perforation. .             Throat- Mallampati II , mucosa clear , drainage- none, tonsils- atrophic.   Neck-  trachea midline, no stridor , thyroid nl, carotid no bruit.+ cervical kyphosis Chest -  symmetrical excursion , unlabored           Heart/CV- RRR , no murmur , no gallop  , no rub, nl s1 s2                           - JVD- none , edema+trace, stasis changes- none, varices- none           Lung- distant , wheeze -none, cough-none , dullness-none, rub- none           Chest wall- + kyphosis Abd- Br/ Gen/ Rectal- Not done, not indicated Extrem- no cyanosis, edema or clubbing Neuro- grossly intact to observation

## 2016-01-31 NOTE — Patient Instructions (Addendum)
Try sample Bevespi maintenance inhaler     Inhale 2 puffs   Twice daily    Try this instead of Symbicort. When the sample runs out, go back to your Symbicort  Scripts to hold for doxycycline and prednisone taper sent to drug store  Please call as needed

## 2016-01-31 NOTE — Assessment & Plan Note (Signed)
She is currently using oxygen 2 or 3 L at rest and 3 L or 4 L with a pulse regulator on her portable oxygen

## 2016-03-05 ENCOUNTER — Ambulatory Visit: Payer: Medicare HMO | Admitting: Internal Medicine

## 2016-03-30 ENCOUNTER — Telehealth: Payer: Self-pay | Admitting: Internal Medicine

## 2016-03-30 NOTE — Telephone Encounter (Signed)
Refill for duoneb has been placed. Nothing further needed

## 2016-03-31 ENCOUNTER — Other Ambulatory Visit: Payer: Self-pay | Admitting: *Deleted

## 2016-03-31 DIAGNOSIS — J449 Chronic obstructive pulmonary disease, unspecified: Secondary | ICD-10-CM

## 2016-03-31 MED ORDER — IPRATROPIUM-ALBUTEROL 0.5-2.5 (3) MG/3ML IN SOLN: RESPIRATORY_TRACT | Status: DC

## 2016-04-20 ENCOUNTER — Telehealth: Payer: Self-pay | Admitting: Internal Medicine

## 2016-04-20 MED ORDER — BUDESONIDE-FORMOTEROL FUMARATE 160-4.5 MCG/ACT IN AERO: 2.0000 | INHALATION_SPRAY | Freq: Two times a day (BID) | RESPIRATORY_TRACT | 0 refills | Status: DC

## 2016-04-20 NOTE — Telephone Encounter (Signed)
Spoke with pt, requesting symbicort 160 samples.  These have been placed up front for pt.  Nothing further needed.

## 2016-05-05 ENCOUNTER — Ambulatory Visit (INDEPENDENT_AMBULATORY_CARE_PROVIDER_SITE_OTHER): Payer: Medicare HMO | Admitting: Internal Medicine

## 2016-05-05 ENCOUNTER — Encounter: Payer: Self-pay | Admitting: Internal Medicine

## 2016-05-05 DIAGNOSIS — J449 Chronic obstructive pulmonary disease, unspecified: Secondary | ICD-10-CM

## 2016-05-05 DIAGNOSIS — J9611 Chronic respiratory failure with hypoxia: Secondary | ICD-10-CM

## 2016-05-05 MED ORDER — BUDESONIDE-FORMOTEROL FUMARATE 160-4.5 MCG/ACT IN AERO: INHALATION_SPRAY | RESPIRATORY_TRACT | 12 refills | Status: DC

## 2016-05-05 MED ORDER — BUDESONIDE-FORMOTEROL FUMARATE 160-4.5 MCG/ACT IN AERO: 2.0000 | INHALATION_SPRAY | Freq: Two times a day (BID) | RESPIRATORY_TRACT | 0 refills | Status: DC

## 2016-05-05 MED ORDER — PREDNISONE 10 MG PO TABS: ORAL_TABLET | ORAL | 3 refills | Status: DC

## 2016-05-05 NOTE — Assessment & Plan Note (Signed)
She dislikes current heat and humidity as expected but has not had any significant exacerbation or acute event and no recent infection. Plan-Symbicort sample as requested. Explained we cannot maintain her on samples-she needs to check her insurance formulary for preferred alternatives.

## 2016-05-05 NOTE — Assessment & Plan Note (Signed)
Oxygen need is pretty much continuous now. Usual compromise between weight of portable oxygen system and duration of support before recharge. She will probably stick with what she has but is invited to discuss options with her DME company.

## 2016-05-05 NOTE — Patient Instructions (Signed)
Scripts sent for prednisone taper to hold, and for Symbicort  Sample Symbicort 160     Inhale 2 puffs then rinse mouth, otwice daily  Please call as needed

## 2016-05-05 NOTE — Progress Notes (Signed)
Patient ID: Kaitlyn MoralesFrances L Crittendon, female    DOB: 06-01-1946, 70 y.o.   MRN: [id number]  HPI  08/20/2015-70 year old female former smoker followed for COPD mixed type/Gold D, chronic hypoxic respiratory failure, complicated by anxiety, hyponatremia, HBP, aortic atherosclerosis O2 2 L/[facility] sleep/exertion FOLLOWS FOR:c/o increased sob over the last mth. gradually getting worse,cough esp. in am-thick clear,feels like going to "smother" if can't get it up,occass. wheezing,midchest tightness and pain comes and goes,not sleeping well due to sob, sleeping on 2 pillows,no fcs.Has not used Brovana samples. Flu vaccine UTD More aware of dyspnea, especially noticing nasal stuffiness in the morning, over the last month, coincident with use of indoor heat. Nebulizer used each morning without much effect. Still likes Symbicort, needing financial assistance. Graduated from pulmonary rehabilitation mid October and started [facility] the [facility] for [facility] 4 days a week. Using oxygen continuously now.  11/22/2015-70 year old female former smoker followed for COPD mixed type/Gold D, chronic hypoxic respiratory failure, complicated by anxiety, hyponatremia, HBP, aortic atherosclerosis O2 2 L/[facility] sleep/exertion     daughter here Reports increased SOB for 1 week. Denies any constant chest tightness, wheezing or coughing Using neb meds more frequently. Increased dyspnea on exertion noted "off and on" but perhaps more than usual this week. Prednisone and antibiotic take around January 28, left over, made a little help only. Admits occasional cough and phlegm which is hard to clear but no fever or chills blood chest pain or frankly purulent sputum. Her portable concentrator battery doesn't last very long so she prefers portable tank oxygen. Her PCP checks labs-last sodium 134. CXR 08/20/2015-No active cardiopulmonary disease. Emphysema.  01/31/16- 70 year old female former smoker followed for COPD mixed type/Gold D, chronic  hypoxic respiratory failure, complicated by anxiety, hyponatremia, HBP, aortic atherosclerosis O2 2-4 liters/[facility] LOV 3/29-NP- post Hospital- FOLLOWS FOR: Pt continues to wear O2-states she is having a hard with SOB and wheezing. Pt has questions about her medications and what to take. She tells me she isn't really that different and is not experiencing an exacerbation. Using nebulizer once or twice daily, Symbicort twice daily and then either Ventolin or Combivent. She likes Symbicort, saying it worked better than other inhalers.  05/05/2016-70 year old female former smoker followed for COPD mixed type/Gold D, chronic hypoxic respiratory failure, complicated by anxiety, hyponatremia, HBP, aortic atherosclerosis O2 2-4 liters/[facility] FOLLOWS FOR: Pt continues to wear O2 DME: [facility]. Heat and Humidity are bothering her breathing-tries to stay indoors as much as possible.  Long discussion of alternative portable oxygen systems. She is using portable tanks.  Review of Systems-see HPI Constitutional:   No-   weight loss, night sweats, fevers, chills, fatigue, lassitude. HEENT:   No-  headaches, difficulty swallowing, tooth/dental problems, sore throat,       No- sneezing, itching, no-ear ache, + nasal congestion, post nasal drip,  CV:  No- chest pain, orthopnea, PND, +swelling in lower extremities, anasarca, dizziness, palpitations.  Resp:    +shortness of breath with exertion or at rest.               + productive cough,  -productive cough,  No-  coughing up of blood.              No-  change in color of mucus.   wheezing.   Skin: No-   rash or lesions. GI:  No-   heartburn, indigestion, abdominal pain, nausea, vomiting,  GU: MS:   neck pain w/o radicular symptoms Neuro-  Psych:  No- change in mood or  affect. depression or anxiety.  No memory loss.   Objective:   Physical Exam  General- Alert, Oriented, Affect+ anxious, Distress- none acute, conversational on O2, + very frail Skin- + steroid  ecchymoses on forearms Lymphadenopathy- none Head- atraumatic            Eyes- Gross vision intact, PERRLA, conjunctivae clear secretions            Ears- Hearing, canals-normal            Nose-  no-Septal dev,  polyps, erosion, perforation. .             Throat- Mallampati II , mucosa clear , drainage- none, tonsils- atrophic.   Neck-  trachea midline, no stridor , thyroid nl, carotid no bruit.+ cervical kyphosis Chest - symmetrical excursion , unlabored           Heart/CV- RRR , no murmur , no gallop  , no rub, nl s1 s2                           - JVD- none , edema+trace, stasis changes- none, varices- none           Lung- distant , wheeze -none, cough-none , dullness-none, rub- none           Chest wall- + kyphosis Abd- Br/ Gen/ Rectal- Not done, not indicated Extrem- no cyanosis, edema or clubbing Neuro- grossly intact to observation

## 2016-06-15 ENCOUNTER — Telehealth: Payer: Self-pay | Admitting: Internal Medicine

## 2016-06-15 NOTE — Telephone Encounter (Signed)
Called and spoke with pt and she is aware of the differences with the flu shots that they offer.  Pt stated that her insurance company will be giving the flu shots out next week and she was not sure which one to get this year.  Pt will check with her insurance company to see if they offer the HD flu vaccine.

## 2016-07-22 ENCOUNTER — Telehealth: Payer: Self-pay | Admitting: Internal Medicine

## 2016-07-22 DIAGNOSIS — J9611 Chronic respiratory failure with hypoxia: Secondary | ICD-10-CM

## 2016-07-22 DIAGNOSIS — J449 Chronic obstructive pulmonary disease, unspecified: Secondary | ICD-10-CM

## 2016-07-22 NOTE — Telephone Encounter (Signed)
Called and spoke with pt and she stated that she needs a new nebulizer machine. Her machine is over 71 years old and is not putting out like it should.  New order has been placed and nothing further is needed.

## 2016-08-12 ENCOUNTER — Other Ambulatory Visit: Payer: Self-pay | Admitting: Internal Medicine

## 2016-08-14 NOTE — Telephone Encounter (Signed)
CY please advise on refill. Thanks. 

## 2016-08-14 NOTE — Telephone Encounter (Signed)
Rx called in per CY.

## 2016-08-14 NOTE — Telephone Encounter (Signed)
Ok refill 6 months 

## 2016-08-27 IMAGING — CR DG CHEST 2V
2 series · 2 of 2 positions shown · non-contrast
Comparison: 01/16/2014

CLINICAL DATA: COPD acute exacerbation

EXAM:
CHEST  2 VIEW

[view not recorded (1 of 2)]
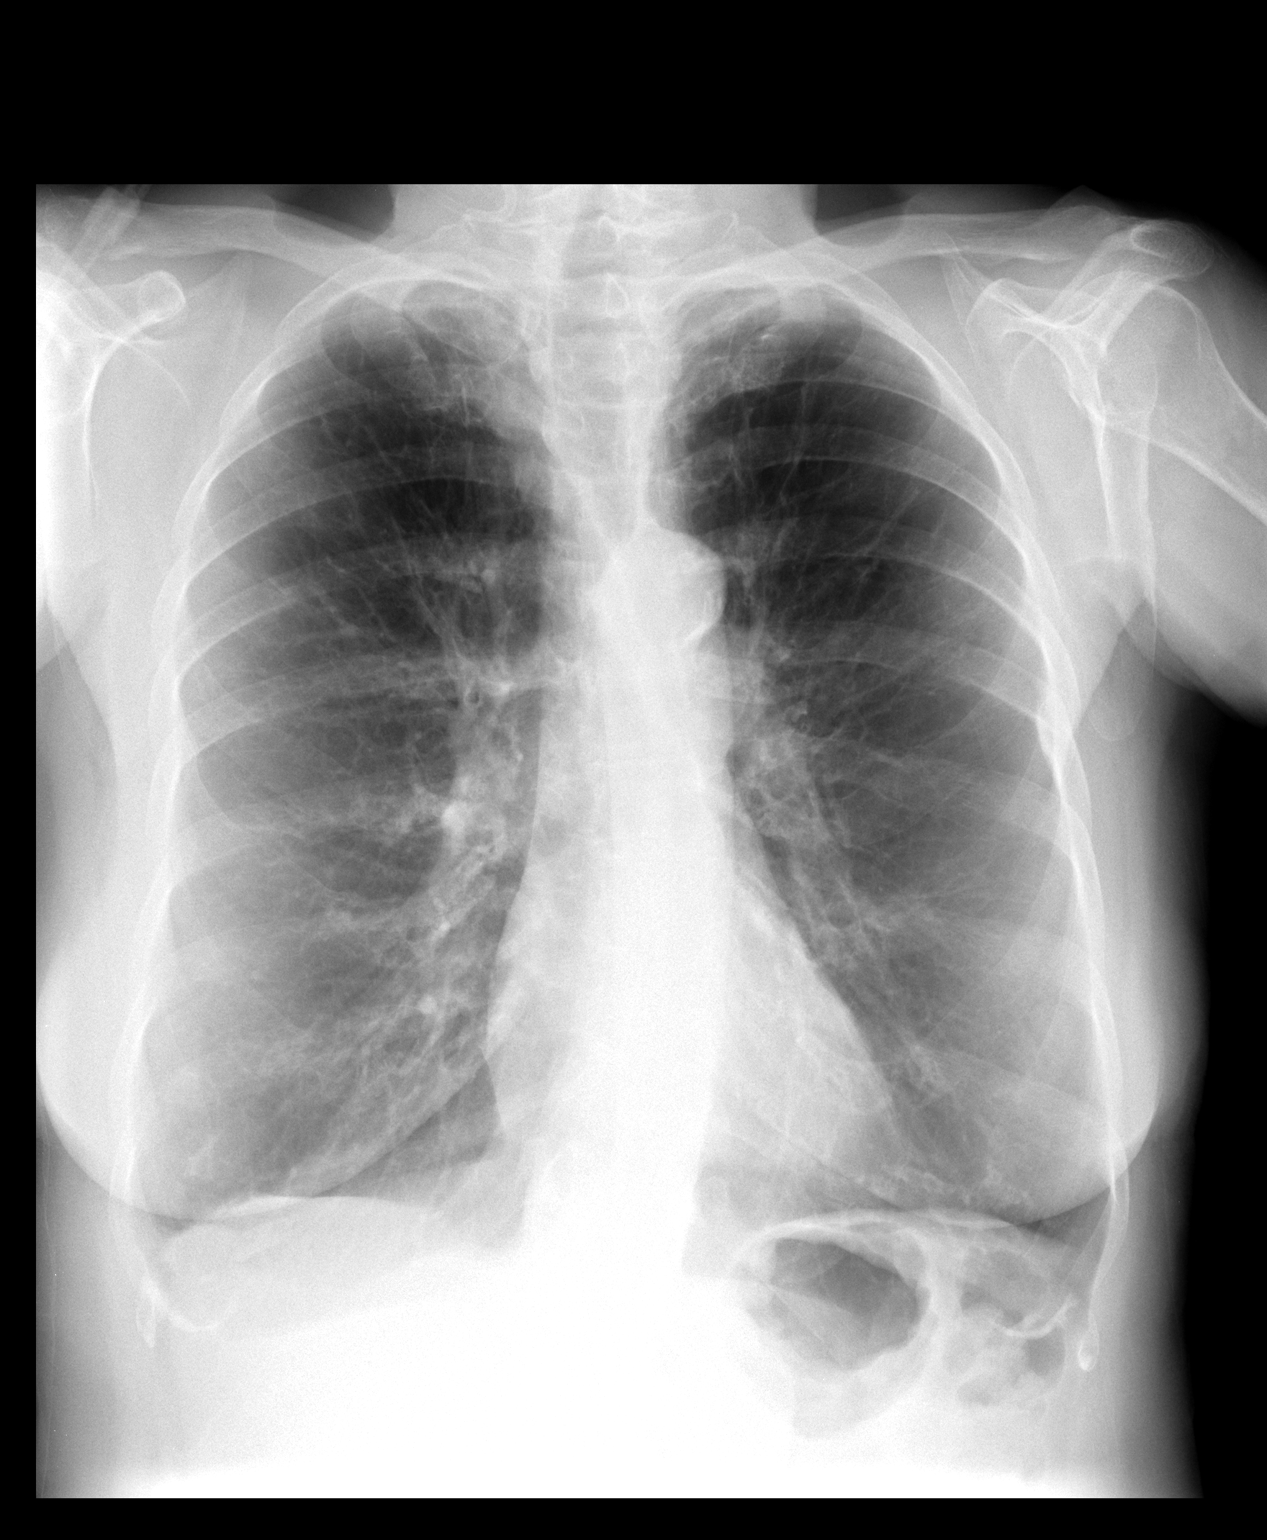

[view not recorded (2 of 2)]
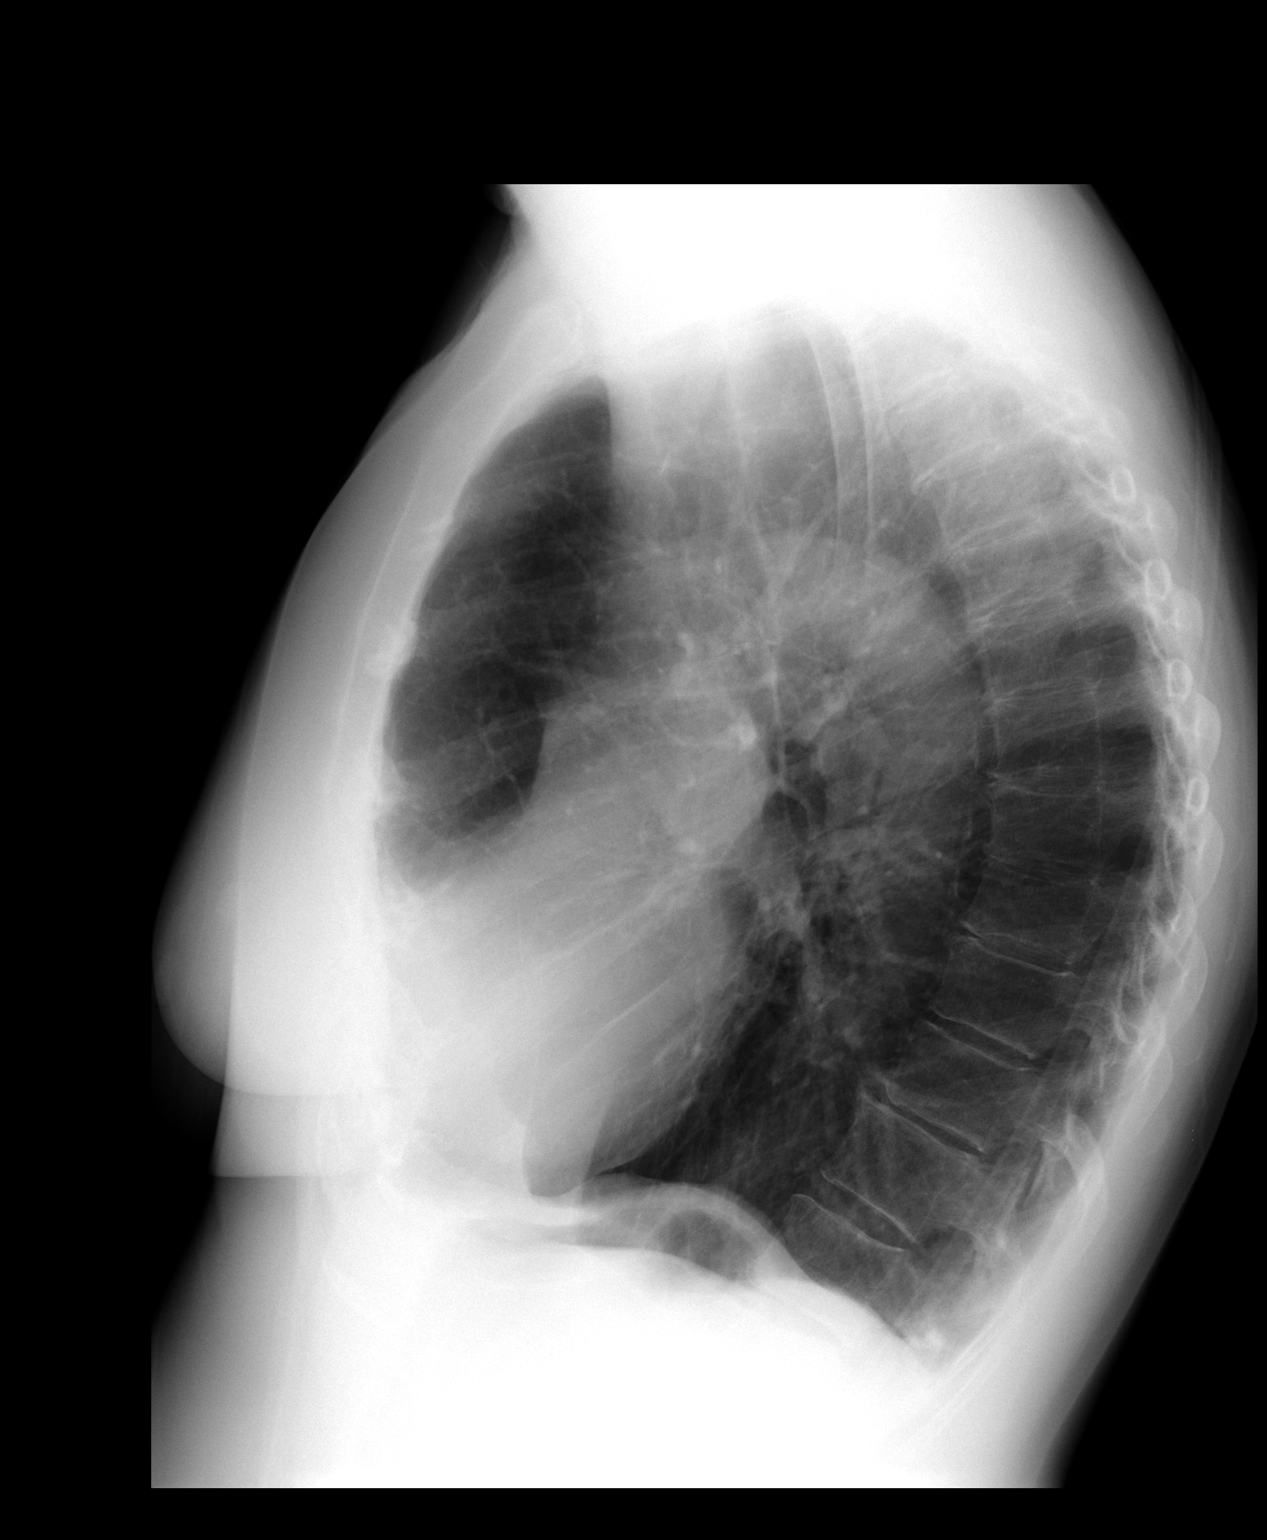

[2 of 2 positions shown; findings below may reference images not displayed]

FINDINGS: Hyperinflation and emphysematous change. No superimposed pneumonia
or edema. Stable appearance of nipple shadow at the left base.
Normal heart size and aortic contours. Remote lateral left seventh
rib fracture.
IMPRESSION: Emphysema without superimposed pneumonia.

## 2016-09-08 ENCOUNTER — Telehealth: Payer: Self-pay | Admitting: Internal Medicine

## 2016-09-08 NOTE — Telephone Encounter (Signed)
Called and spoke to pt. Pt is requesting Symbicort 160 sample. Pt last seen 8.15.17 by CY.   Dr. Maple HudsonYoung please advise if ok for pt to have sample. Thanks.   No Known Allergies  Current Outpatient Prescriptions on File Prior to Visit  Medication Sig Dispense Refill  . albuterol (PROVENTIL HFA;VENTOLIN HFA) 108 (90 Base) MCG/ACT inhaler Inhale 2 puffs into the lungs every 6 (six) hours as needed for wheezing or shortness of breath. Please dispense generic. 1 Inhaler 2  . ALPRAZolam (XANAX) 0.5 MG tablet TAKE 1 TABLET THREE TIMES DAILY AS NEEDED FOR ANXIETY 270 tablet 1  . aspirin 81 MG tablet Take 81 mg by mouth every other day. Take one tablet every other day    . budesonide-formoterol (SYMBICORT) 160-4.5 MCG/ACT inhaler 2 puffs then rinse mouth, twice daily 1 Inhaler 12  . budesonide-formoterol (SYMBICORT) 160-4.5 MCG/ACT inhaler Inhale 2 puffs into the lungs 2 (two) times daily. 1 Inhaler 0  . furosemide (LASIX) 20 MG tablet Take 1 tablet (20 mg total) by mouth daily as needed. 30 tablet 0  . guaiFENesin-dextromethorphan (ROBITUSSIN DM) 100-10 MG/5ML syrup Take 5 mLs by mouth every 4 (four) hours as needed for cough. 118 mL 1  . Ipratropium-Albuterol (COMBIVENT) 20-100 MCG/ACT AERS respimat Inhale 1 puff into the lungs every 6 (six) hours. 3 Inhaler 3  . ipratropium-albuterol (DUONEB) 0.5-2.5 (3) MG/3ML SOLN USE ONE VIAL VIA NEBULIZER 2 TIMES DAILY 360 mL 5  . lisinopril (PRINIVIL,ZESTRIL) 20 MG tablet Take 20 mg by mouth daily.    . Multiple Vitamins-Minerals (PRESERVISION AREDS 2 PO) Take 2 capsules by mouth daily. Reported on 11/22/2015    . POTASSIUM GLUCONATE PO Take 1 tablet by mouth daily. Reported on 12/18/2015    . predniSONE (DELTASONE) 10 MG tablet 4 X 2 DAYS, 3 X 2 DAYS, 2 X 2 DAYS, 1 X 2 DAYS 20 tablet 3  . [DISCONTINUED] amLODipine (NORVASC) 10 MG tablet Take 1 tablet (10 mg total) by mouth daily. 30 tablet 3  . [DISCONTINUED] pantoprazole (PROTONIX) 40 MG tablet Take 1 tablet (40 mg  total) by mouth 2 (two) times daily before a meal. 60 tablet 3   No current facility-administered medications on file prior to visit.

## 2016-09-09 MED ORDER — BUDESONIDE-FORMOTEROL FUMARATE 160-4.5 MCG/ACT IN AERO: 2.0000 | INHALATION_SPRAY | Freq: Two times a day (BID) | RESPIRATORY_TRACT | 0 refills | Status: DC

## 2016-09-09 NOTE — Telephone Encounter (Signed)
Ok 2 samples Symbicort 160 if we have them

## 2016-09-09 NOTE — Telephone Encounter (Signed)
Samples placed up front for pick up. Pt aware & voiced her understanding. Nothing further needed.

## 2016-09-11 ENCOUNTER — Ambulatory Visit (INDEPENDENT_AMBULATORY_CARE_PROVIDER_SITE_OTHER)
Admission: RE | Admit: 2016-09-11 | Discharge: 2016-09-11 | Disposition: A | Discharge status: Home / Self Care | Payer: Medicare HMO | Source: Ambulatory Visit | Attending: Internal Medicine | Admitting: Internal Medicine

## 2016-09-11 ENCOUNTER — Encounter: Payer: Self-pay | Admitting: Internal Medicine

## 2016-09-11 ENCOUNTER — Ambulatory Visit (INDEPENDENT_AMBULATORY_CARE_PROVIDER_SITE_OTHER): Payer: Medicare HMO | Admitting: Internal Medicine

## 2016-09-11 VITALS — BP 130/68 | HR 109

## 2016-09-11 DIAGNOSIS — I10 Essential (primary) hypertension: Secondary | ICD-10-CM | POA: Diagnosis not present

## 2016-09-11 DIAGNOSIS — J449 Chronic obstructive pulmonary disease, unspecified: Secondary | ICD-10-CM

## 2016-09-11 DIAGNOSIS — J9611 Chronic respiratory failure with hypoxia: Secondary | ICD-10-CM | POA: Insufficient documentation

## 2016-09-11 DIAGNOSIS — J9612 Chronic respiratory failure with hypercapnia: Secondary | ICD-10-CM | POA: Diagnosis not present

## 2016-09-11 MED ORDER — VALSARTAN 160 MG PO TABS: 160.0000 mg | ORAL_TABLET | Freq: Every day | ORAL | 11 refills | Status: DC

## 2016-09-11 NOTE — Patient Instructions (Addendum)
Stop lisinopril  And start valsartan 160 mg daily   02 default 2lpm but with exertion 4lpm with the goal of keeping it above 88% at all times  Take nebulizer (duoneb) up to every 6 hours as needed   Work on inhaler technique:  relax and gently blow all the way out then take a nice smooth deep breath back in, triggering the inhaler at same time you start breathing in.  Hold for up to 5 seconds if you can. Blow out thru nose. Rinse and gargle with water when done     Please remember to go to the  x-ray department downstairs for your tests - we will call you with the results when they are available.    Please schedule a follow up office visit in 4 weeks, sooner if needed to see Tammy on return and bring all medications and the alternatives to your valsartan if you find it too expensive

## 2016-09-11 NOTE — Assessment & Plan Note (Signed)
HCO3  12/01/15  = 32 c/w mild hypercarbia   Adequate control on present rx, reviewed in detail with pt > no change in rx needed  = 2 lpm is the floor, 4lpm is the floor with goal of > 88% at all times

## 2016-09-11 NOTE — Assessment & Plan Note (Addendum)
Quit smoking 06/2015 PFT  07/07/11 - FEV1 0.73 (38%) ratio 0.37  DLCO 45% corrects to 65% - 09/11/2016  After extensive coaching HFA effectiveness =    75%   Downhill despite quit smoking x 9 m.  DDX of  difficult airways management almost all start with A and  include Adherence, Ace Inhibitors, Acid Reflux, Active Sinus Disease, Alpha 1 Antitripsin deficiency, Anxiety masquerading as Airways dz,  ABPA,  Allergy(esp in young), Aspiration (esp in elderly), Adverse effects of meds,  Active smokers, A bunch of PE's (a small clot burden can't cause this syndrome unless there is already severe underlying pulm or vascular dz with poor reserve) plus two Bs  = Bronchiectasis and Beta blocker use..and one C= CHF  Adherence is always the initial "prime suspect" and is a multilayered concern that requires a "trust but verify" approach in every patient - starting with knowing how to use medications, especially inhalers, correctly, keeping up with refills and understanding the fundamental difference between maintenance and prns vs those medications only taken for a very short course and then stopped and not refilled.  - very shaky on details of care -needs to return  with all meds in hand using a trust but verify approach to confirm accurate Medication  Reconciliation The principal here is that until we are certain that the  patients are doing what we've asked, it makes no sense to ask them to do more.  - - The proper method of use, as well as anticipated side effects, of a metered-dose inhaler are discussed and demonstrated to the patient. Improved effectiveness after extensive coaching during this visit to a level of approximately 75 % from a baseline of 50 % ok to continue HFA for now  ACEi adverse effects at the  top of the usual list of suspects and the only way to rule it out is a trial off > see a/p    ?allergy/ asthma > doubt as no better on prednisone  ? Anxiety > usually at the bottom of this list of  usual suspects but should be much higher on this pt's based on H and P  > Follow up per Primary Care planned   I had an extended discussion with the patient reviewing all relevant studies completed to date and  lasting 25 minutes of a 40  minute acute office visit in pt not seen by me in > 3 years  re  non-specific but potentially very serious pulmonary symptoms of unknown etiology.  Formulary restrictions will be an ongoing challenge for the forseable future and I would be happy to pick an alternative if the pt will first  provide me a list of them but pt  will need to return here for training for any new device that is required eg dpi vs hfa vs respimat.    In meantime we can always provide samples so the patient never runs out of any needed respiratory medications.   Each maintenance medication was reviewed in detail including most importantly the difference between maintenance and prns and under what circumstances the prns are to be triggered using an action plan format that is not reflected in the computer generated alphabetically organized AVS.    Please see AVS for unique instructions that I personally wrote and verbalized to the the pt in detail and then reviewed with pt  by my nurse highlighting any  changes in therapy recommended at today's visit to their plan of care.

## 2016-09-11 NOTE — Progress Notes (Signed)
Patient ID: [name], female    DOB: Jun 08, 1946,    MRN: [id number]    Brief patient profile:  05/05/2016-70 year old female former smoker followed for COPD mixed type/Gold D, chronic hypoxic respiratory failure, complicated by anxiety, hyponatremia, HBP, aortic atherosclerosis O2 2-4 liters/[facility] FOLLOWS FOR: Pt continues to wear O2 DME: [facility]. Heat and Humidity are bothering her breathing-tries to stay indoors as much as possible.  Long discussion of alternative portable oxygen systems. She is using portable tanks. rec Scripts sent for prednisone taper to hold, and for Symbicort Sample Symbicort 160     Inhale 2 puffs then rinse mouth, twice daily    09/11/2016  Acute extended ov/[name] re: quit smoking 06/2015  copd GOLD III / chronic resp failure on 2lpm -4lpm but rarely uses the 4/ maint rx symb 160 2bid and duoneb avg bid Chief Complaint  Patient presents with  . Shortness of Breath    Increased SOB started today. Was in the office today to pickup samples and o2 dropped.   completed rehab Oct 13th 2016 some better but still variable sob with adls and gradually  worse sob ever since March 2017  Even p pred/duoneb/ assoc with dry hacking cough on acei  Doe =  MMRC4  = sob if tries to leave home or while getting dressed   No better with symbicort though hfa poor   Sleeps ok with noct or am exac/ excess mucus    No obvious day to day or daytime variability or assoc excess/ purulent sputum or mucus plugs or hemoptysis or cp or chest tightness, subjective wheeze or overt sinus or hb symptoms. No unusual exp hx or h/o childhood pna/ asthma or knowledge of premature birth.  Sleeping ok without nocturnal  or early am exacerbation  of respiratory  c/o's or need for noct saba. Also denies any obvious fluctuation of symptoms with weather or environmental changes or other aggravating or alleviating factors except as outlined above   Current Medications, Allergies, Complete Past Medical  History, Past Surgical History, Family History, and Social History were reviewed in [facility] electronic medical record.  ROS  The following are not active complaints unless bolded sore throat, dysphagia, dental problems, itching, sneezing,  nasal congestion or excess/ purulent secretions, ear ache,   fever, chills, sweats, unintended wt loss, classically pleuritic or exertional cp,  orthopnea pnd or leg swelling, presyncope, palpitations, abdominal pain, anorexia, nausea, vomiting, diarrhea  or change in bowel or bladder habits, change in stools or urine, dysuria,hematuria,  rash, arthralgias, visual complaints, headache, numbness, weakness or ataxia or problems with walking or coordination,  change in mood/affect or memory.            Objective:   Physical Exam    amb wf chronically ill quite hoarse/ dry hacking cough    Wt Readings from Last 3 Encounters:  05/05/16 118 lb 9.6 oz (53.8 kg)  01/31/16 119 lb 3.2 oz (54.1 kg)  12/18/15 120 lb (54.4 kg)    Vital signs reviewed - Note on arrival 02 sats  95 % on 2lpm      HEENT: nl dentition, turbinates, and oropharynx. Nl external ear canals without cough reflex   NECK :  without JVD/Nodes/TM/ nl carotid upstrokes bilaterally   LUNGS: no acc muscle use,  slt barrel  contour chest with prominent pseuodowheeze heard best over trachea/ distant bs peripherally    CV:  RRR  no s3 or murmur or increase in P2, nad no edema  ABD:  soft and nontender with nl inspiratory excursion in the supine position. No bruits or organomegaly appreciated, bowel sounds nl  MS:  Nl gait/ ext warm without deformities, calf tenderness, cyanosis or clubbing No obvious joint restrictions   SKIN: warm and dry without lesions    NEURO:  alert, approp, nl sensorium with  no motor or cerebellar deficits apparent.     CXR PA and Lateral:   09/11/2016 :    I personally reviewed images and agree with radiology impression as follows:    COPD.  No  acute pulmonary disease .

## 2016-09-11 NOTE — Assessment & Plan Note (Signed)
ACEI stopped indefinitely by Sherene SiresWert  10/09/2011 due to concerns atypical copd symptoms might be in part acei related - D/c acei again 09/11/2016 >>>   In the best review of chronic cough to date ( NEJM 2016 375 [id number]) ,  ACEi are now felt to cause cough in up to  20% of pts which is a 4 fold increase from previous reports and does not include the variety of non-specific complaints we see in pulmonary clinic in pts on ACEi but previously attributed to another dx like  Copd/asthma and  include PNDS, throat and chest congestion, "bronchitis", unexplained dyspnea and noct "strangling" sensations, and hoarseness, but also  atypical /refractory GERD symptoms like dysphagia and "bad heartburn"   The only way I know  to prove this is not an "ACEi Case" is a trial off ACEi x a minimum of 6 weeks then regroup.   Will start diovan 160 mg daily and have her return in 4 weeks for recheck

## 2016-09-25 ENCOUNTER — Ambulatory Visit (INDEPENDENT_AMBULATORY_CARE_PROVIDER_SITE_OTHER): Payer: Medicare HMO | Admitting: Internal Medicine

## 2016-09-25 ENCOUNTER — Encounter: Payer: Self-pay | Admitting: Internal Medicine

## 2016-09-25 VITALS — BP 132/74 | HR 109 | Temp 97.8°F | Ht 62.0 in | Wt 117.4 lb

## 2016-09-25 DIAGNOSIS — F17201 Nicotine dependence, unspecified, in remission: Secondary | ICD-10-CM

## 2016-09-25 DIAGNOSIS — J9611 Chronic respiratory failure with hypoxia: Secondary | ICD-10-CM | POA: Diagnosis not present

## 2016-09-25 DIAGNOSIS — J441 Chronic obstructive pulmonary disease with (acute) exacerbation: Secondary | ICD-10-CM

## 2016-09-25 MED ORDER — METHYLPREDNISOLONE ACETATE 80 MG/ML IJ SUSP
80.0000 mg | Freq: Once | INTRAMUSCULAR | Status: AC
  Administered 2016-09-25: 80 mg via INTRAMUSCULAR

## 2016-09-25 NOTE — Patient Instructions (Signed)
Sample Trelegy Ellipta   Inhaler     Inhale 1 puff then rinse mouth, once daily   Try this instead of Symbicort. When you run out of the sample, go back to Symbicort.  Ok to turn oxygen up to 4L when needed for shortness of breath.  Depo 80      Dx exacerbation COPD  Please  Call as needed

## 2016-09-25 NOTE — Assessment & Plan Note (Signed)
Supportive discussion, continuing in remission

## 2016-09-25 NOTE — Assessment & Plan Note (Signed)
With daughter present, I again explained goals of oxygen therapy, potential for airflow decreased going through long hose from concentrator at home. Reemphasized permission to turn oxygen flow up to 4 L when needed during activity. Plan-Depo-Medrol.

## 2016-09-25 NOTE — Assessment & Plan Note (Addendum)
More uncomfortable about her breathing, mainly dyspnea, although she has not tried increasing oxygen to 4 L as instructed. No specific acute event identified. She asks Depo-Medrol. Plan-Depo-Medrol, sample Trelegy Ellipta inhaler for trial

## 2016-09-25 NOTE — Progress Notes (Signed)
Patient ID: Kaitlyn MoralesFrances L Coglianese, female    DOB: June 18, 1946, 71 y.o.   MRN: [id number]  HPI female former smoker followed for COPD mixed type/Gold D, chronic hypoxic respiratory failure, complicated by anxiety, hyponatremia, HBP, aortic atherosclerosis O2 2-4 liters/[facility]  -----------------------------------------------------------------------------  05/05/2016-71 year old female former smoker followed for COPD mixed type/Gold D, chronic hypoxic respiratory failure, complicated by anxiety, hyponatremia, HBP, aortic atherosclerosis O2 2-4 liters/[facility] FOLLOWS FOR: Pt continues to wear O2 DME: [facility]. Heat and Humidity are bothering her breathing-tries to stay indoors as much as possible.  Long discussion of alternative portable oxygen systems. She is using portable tanks.  09/25/2016-71 year old female former smoker followed for COPD mixed type/Gold D, chronic hypoxic respiratory failure, complicated by anxiety, hyponatremia, HBP, aortic atherosclerosis, glaucoma O2-4 L/[facility] LOV 12/22- educated med techniques. Acute visit-shortness of breath. Symbicort, albuterol HFA, pulmonary/DuoNeb used twice daily Here with her daughter today. She expresses concern that she is more short of breath without change in cough, chest pain, palpitation. Scant sputum is white. No acute events. Mostly dyspnea on exertion. She worries about recurrence of small airway mucous plugs diagnosed by chest CT several years ago. Using nebulizer every 4-6 hours with no increase in secretion clearance. Taking Robitussin-DM. Understands instruction to adjust oxygen between 2 and 4 L, but she believes it mostly at 2 or 3. CXR 09/11/2016 IMPRESSION: COPD.  No acute pulmonary disease   Review of Systems-see HPI Constitutional:   No-   weight loss, night sweats, fevers, chills, fatigue, lassitude. HEENT:   No-  headaches, difficulty swallowing, tooth/dental problems, sore throat,       No- sneezing, itching, no-ear ache,  nasal  congestion, post nasal drip,  CV:  No- chest pain, orthopnea, PND, +swelling in lower extremities, anasarca, dizziness, palpitations.  Resp:    +shortness of breath with exertion or at rest.               + productive cough,  productive cough,  No-  coughing up of blood.              No-  change in color of mucus.   wheezing.   Skin: No-   rash or lesions. GI:  No-   heartburn, indigestion, abdominal pain, nausea, vomiting,  GU: MS:   neck pain w/o radicular symptoms Neuro-  Psych:  No- change in mood or affect. depression or anxiety.  No memory loss.   Objective:   Physical Exam  General- Alert, Oriented, Affect+ anxious, Distress- none acute, conversational on O2,  Skin- + steroid ecchymoses on forearms Lymphadenopathy- none Head- atraumatic            Eyes- Gross vision intact, PERRLA, conjunctivae clear secretions            Ears- Hearing, canals-normal            Nose-  no-Septal dev,  polyps, erosion, perforation. .             Throat- Mallampati II , mucosa clear , drainage- none, tonsils- atrophic.   Neck-  trachea midline, no stridor , thyroid nl, carotid no bruit.+ cervical kyphosis Chest - symmetrical excursion , unlabored           Heart/CV- RRR , no murmur , no gallop  , no rub, nl s1 s2                           - JVD- none , edema+trace, stasis changes- none, varices-  none           Lung- distant , wheeze -none, cough-none , dullness-none, rub- none           Chest wall- + kyphosis Abd- Br/ Gen/ Rectal- Not done, not indicated Extrem- no cyanosis, edema or clubbing Neuro- grossly intact to observation

## 2016-10-09 ENCOUNTER — Ambulatory Visit: Payer: Medicare HMO | Admitting: Adult Health

## 2016-10-13 ENCOUNTER — Other Ambulatory Visit: Payer: Self-pay

## 2016-10-13 MED ORDER — PREDNISONE 10 MG PO TABS: ORAL_TABLET | ORAL | 5 refills | Status: DC

## 2016-10-13 NOTE — Telephone Encounter (Signed)
Request from pharmacy for refill on Prednisone taper. Ok'd by Dr. Maple HudsonYoung to refill with 5 refills.

## 2016-10-15 ENCOUNTER — Telehealth: Payer: Self-pay | Admitting: Internal Medicine

## 2016-10-15 MED ORDER — FLUTICASONE-UMECLIDIN-VILANT 100-62.5-25 MCG/INH IN AEPB: 1.0000 | INHALATION_SPRAY | Freq: Every day | RESPIRATORY_TRACT | 0 refills | Status: DC

## 2016-10-15 NOTE — Telephone Encounter (Signed)
Ok, I spoke with pt and advised her that the sample will be up front for her to pick up. She was reluctant to send in Rx of Trelegy at this time. She wants to hold off on this until she finishes the sample. She is trying to see if she should stay on Symbicort versus Trelegy. She will let us know in 2 weeks. Nothing further needed.

## 2016-10-15 NOTE — Telephone Encounter (Signed)
Patient Instructions by Waymon Budgelinton D Young, MD at 09/25/2016 10:30 AM   Author: Waymon Budgelinton D Young, MD Author Type: Physician Filed: 09/25/2016 10:56 AM  Note Status: Signed Cosign: Cosign Not Required Encounter Date: 09/25/2016 10:30 AM  Editor: Waymon Budgelinton D Young, MD (Physician)    Sample Trelegy Ellipta   Inhaler     Inhale 1 puff then rinse mouth, once daily   Try this instead of Symbicort. When you run out of the sample, go back to Symbicort.     Prescribed by Dr. Maple HudsonYoung

## 2016-10-15 NOTE — Telephone Encounter (Signed)
Ok 1 sample Ttrelegy  And print script for [employer] Ellipta, # 1, with coupon, Inhale 1 puff, then rinse mouth, once daily, refill prn                       Don't know if her insurance will cover it

## 2016-10-15 NOTE — Telephone Encounter (Addendum)
Spoke with pt, she states the Trelegy is doing better than the Symbicort. On CY note from last visit he stated to go back to Symbicort. CY can we give her more samples of Trelegy? Please advise.    No Known Allergies   Current Outpatient Prescriptions on File Prior to Visit  Medication Sig Dispense Refill  . albuterol (PROVENTIL HFA;VENTOLIN HFA) 108 (90 Base) MCG/ACT inhaler Inhale 2 puffs into the lungs every 6 (six) hours as needed for wheezing or shortness of breath. Please dispense generic. 1 Inhaler 2  . ALPRAZolam (XANAX) 0.5 MG tablet TAKE 1 TABLET THREE TIMES DAILY AS NEEDED FOR ANXIETY 270 tablet 1  . aspirin 81 MG tablet Take 81 mg by mouth every other day. Take one tablet every other day    . budesonide-formoterol (SYMBICORT) 160-4.5 MCG/ACT inhaler 2 puffs then rinse mouth, twice daily 1 Inhaler 12  . furosemide (LASIX) 20 MG tablet Take 1 tablet (20 mg total) by mouth daily as needed. 30 tablet 0  . guaiFENesin-dextromethorphan (ROBITUSSIN DM) 100-10 MG/5ML syrup Take 5 mLs by mouth every 4 (four) hours as needed for cough. 118 mL 1  . ipratropium-albuterol (DUONEB) 0.5-2.5 (3) MG/3ML SOLN USE ONE VIAL VIA NEBULIZER 2 TIMES DAILY 360 mL 5  . Multiple Vitamins-Minerals (PRESERVISION AREDS 2 PO) Take 2 capsules by mouth daily. Reported on 11/22/2015    . POTASSIUM GLUCONATE PO Take 1 tablet by mouth daily. Reported on 12/18/2015    . predniSONE (DELTASONE) 10 MG tablet Take 4 tablets by mouth daily for 2 days, then take 3 tablets daily for 2 days daily for 2 days,, then take 2 tablets daily for 2 days, then take 1 tablet for 2 days. 20 tablet 5  . valsartan (DIOVAN) 160 MG tablet Take 1 tablet (160 mg total) by mouth daily. 30 tablet 11  . [DISCONTINUED] amLODipine (NORVASC) 10 MG tablet Take 1 tablet (10 mg total) by mouth daily. 30 tablet 3  . [DISCONTINUED] pantoprazole (PROTONIX) 40 MG tablet Take 1 tablet (40 mg total) by mouth 2 (two) times daily before a meal. 60 tablet 3   No  current facility-administered medications on file prior to visit.

## 2016-11-02 ENCOUNTER — Ambulatory Visit: Payer: Medicare HMO | Admitting: Internal Medicine

## 2016-11-04 ENCOUNTER — Telehealth: Payer: Self-pay | Admitting: Internal Medicine

## 2016-11-04 MED ORDER — FLUTICASONE-UMECLIDIN-VILANT 100-62.5-25 MCG/INH IN AEPB: 1.0000 | INHALATION_SPRAY | Freq: Every day | RESPIRATORY_TRACT | 0 refills | Status: DC

## 2016-11-04 NOTE — Telephone Encounter (Signed)
Spoke with pt, requesting trelegy sample.  This has been left up front for pt.  Nothing further needed.

## 2016-11-26 ENCOUNTER — Encounter (HOSPITAL_COMMUNITY): Payer: Self-pay

## 2016-11-26 ENCOUNTER — Telehealth: Payer: Self-pay | Admitting: Pulmonary Disease

## 2016-11-26 ENCOUNTER — Emergency Department (HOSPITAL_COMMUNITY): Payer: Medicare HMO

## 2016-11-26 ENCOUNTER — Inpatient Hospital Stay (HOSPITAL_COMMUNITY)
Admission: EM | Admit: 2016-11-26 | Discharge: 2016-12-03 | DRG: 871 | Disposition: A | Discharge status: Home Health | Payer: Medicare HMO | Attending: Internal Medicine | Admitting: Internal Medicine

## 2016-11-26 DIAGNOSIS — R0602 Shortness of breath: Secondary | ICD-10-CM | POA: Diagnosis not present

## 2016-11-26 DIAGNOSIS — J9622 Acute and chronic respiratory failure with hypercapnia: Secondary | ICD-10-CM | POA: Diagnosis present

## 2016-11-26 DIAGNOSIS — R7989 Other specified abnormal findings of blood chemistry: Secondary | ICD-10-CM

## 2016-11-26 DIAGNOSIS — F419 Anxiety disorder, unspecified: Secondary | ICD-10-CM | POA: Diagnosis present

## 2016-11-26 DIAGNOSIS — T380X5A Adverse effect of glucocorticoids and synthetic analogues, initial encounter: Secondary | ICD-10-CM | POA: Diagnosis present

## 2016-11-26 DIAGNOSIS — J189 Pneumonia, unspecified organism: Secondary | ICD-10-CM | POA: Diagnosis present

## 2016-11-26 DIAGNOSIS — Z825 Family history of asthma and other chronic lower respiratory diseases: Secondary | ICD-10-CM

## 2016-11-26 DIAGNOSIS — J441 Chronic obstructive pulmonary disease with (acute) exacerbation: Secondary | ICD-10-CM | POA: Diagnosis present

## 2016-11-26 DIAGNOSIS — R739 Hyperglycemia, unspecified: Secondary | ICD-10-CM | POA: Diagnosis present

## 2016-11-26 DIAGNOSIS — Z87891 Personal history of nicotine dependence: Secondary | ICD-10-CM

## 2016-11-26 DIAGNOSIS — Z800 Family history of malignant neoplasm of digestive organs: Secondary | ICD-10-CM

## 2016-11-26 DIAGNOSIS — I214 Non-ST elevation (NSTEMI) myocardial infarction: Secondary | ICD-10-CM

## 2016-11-26 DIAGNOSIS — I4819 Other persistent atrial fibrillation: Secondary | ICD-10-CM

## 2016-11-26 DIAGNOSIS — I480 Paroxysmal atrial fibrillation: Secondary | ICD-10-CM

## 2016-11-26 DIAGNOSIS — R000 Tachycardia, unspecified: Secondary | ICD-10-CM | POA: Diagnosis present

## 2016-11-26 DIAGNOSIS — J440 Chronic obstructive pulmonary disease with acute lower respiratory infection: Secondary | ICD-10-CM | POA: Diagnosis present

## 2016-11-26 DIAGNOSIS — I10 Essential (primary) hypertension: Secondary | ICD-10-CM | POA: Diagnosis present

## 2016-11-26 DIAGNOSIS — D649 Anemia, unspecified: Secondary | ICD-10-CM | POA: Diagnosis present

## 2016-11-26 DIAGNOSIS — R778 Other specified abnormalities of plasma proteins: Secondary | ICD-10-CM | POA: Diagnosis present

## 2016-11-26 DIAGNOSIS — H3530 Unspecified macular degeneration: Secondary | ICD-10-CM | POA: Diagnosis present

## 2016-11-26 DIAGNOSIS — Z09 Encounter for follow-up examination after completed treatment for conditions other than malignant neoplasm: Secondary | ICD-10-CM

## 2016-11-26 DIAGNOSIS — Z90710 Acquired absence of both cervix and uterus: Secondary | ICD-10-CM

## 2016-11-26 DIAGNOSIS — J9621 Acute and chronic respiratory failure with hypoxia: Secondary | ICD-10-CM | POA: Diagnosis present

## 2016-11-26 DIAGNOSIS — A419 Sepsis, unspecified organism: Secondary | ICD-10-CM | POA: Diagnosis not present

## 2016-11-26 DIAGNOSIS — Z9981 Dependence on supplemental oxygen: Secondary | ICD-10-CM

## 2016-11-26 DIAGNOSIS — H409 Unspecified glaucoma: Secondary | ICD-10-CM | POA: Diagnosis present

## 2016-11-26 DIAGNOSIS — R079 Chest pain, unspecified: Secondary | ICD-10-CM

## 2016-11-26 DIAGNOSIS — I248 Other forms of acute ischemic heart disease: Secondary | ICD-10-CM | POA: Diagnosis present

## 2016-11-26 DIAGNOSIS — D638 Anemia in other chronic diseases classified elsewhere: Secondary | ICD-10-CM | POA: Diagnosis present

## 2016-11-26 HISTORY — DX: Unspecified macular degeneration: H35.30

## 2016-11-26 HISTORY — DX: Unspecified glaucoma: H40.9

## 2016-11-26 LAB — BASIC METABOLIC PANEL
ANION GAP: 9 (ref 5–15)
BUN: 11 mg/dL (ref 6–20)
CALCIUM: 9.2 mg/dL (ref 8.9–10.3)
CO2: 30 mmol/L (ref 22–32)
CREATININE: 0.73 mg/dL (ref 0.44–1.00)
Chloride: 89 mmol/L — ABNORMAL LOW (ref 101–111)
GFR calc Af Amer: >60 mL/min (ref >60)
GLUCOSE: 179 mg/dL — AB (ref 65–99)
Potassium: 4.2 mmol/L (ref 3.5–5.1)
Sodium: 128 mmol/L — ABNORMAL LOW (ref 135–145)

## 2016-11-26 LAB — CBC WITH DIFFERENTIAL/PLATELET
BASOS ABS: 0 10*3/uL (ref 0.0–0.1)
BASOS PCT: 0 %
Eosinophils Absolute: 0 10*3/uL (ref 0.0–0.7)
Eosinophils Relative: 0 %
HEMATOCRIT: 34.1 % — AB (ref 36.0–46.0)
HEMOGLOBIN: 11.1 g/dL — AB (ref 12.0–15.0)
LYMPHS PCT: 2 %
Lymphs Abs: 0.7 10*3/uL (ref 0.7–4.0)
MCH: 28.4 pg (ref 26.0–34.0)
MCHC: 32.6 g/dL (ref 30.0–36.0)
MCV: 87.2 fL (ref 78.0–100.0)
MONO ABS: 2.6 10*3/uL — AB (ref 0.1–1.0)
Monocytes Relative: 8 %
NEUTROS ABS: 29.7 10*3/uL — AB (ref 1.7–7.7)
NEUTROS PCT: 90 %
Platelets: 281 10*3/uL (ref 150–400)
RBC: 3.91 MIL/uL (ref 3.87–5.11)
RDW: 13.3 % (ref 11.5–15.5)
WBC: 33 10*3/uL — ABNORMAL HIGH (ref 4.0–10.5)

## 2016-11-26 LAB — I-STAT ARTERIAL BLOOD GAS, ED
ACID-BASE EXCESS: 5 mmol/L — AB (ref 0.0–2.0)
BICARBONATE: 32.1 mmol/L — AB (ref 20.0–28.0)
O2 SAT: 99 %
TCO2: 34 mmol/L (ref 0–100)
pCO2 arterial: 57.4 mmHg — ABNORMAL HIGH (ref 32.0–48.0)
pH, Arterial: 7.355 (ref 7.350–7.450)
pO2, Arterial: 156 mmHg — ABNORMAL HIGH (ref 83.0–108.0)

## 2016-11-26 LAB — I-STAT CHEM 8, ED
BUN: 14 mg/dL (ref 6–20)
CREATININE: 0.6 mg/dL (ref 0.44–1.00)
Calcium, Ion: 1.18 mmol/L (ref 1.15–1.40)
Chloride: 87 mmol/L — ABNORMAL LOW (ref 101–111)
Glucose, Bld: 186 mg/dL — ABNORMAL HIGH (ref 65–99)
HEMATOCRIT: 36 % (ref 36.0–46.0)
HEMOGLOBIN: 12.2 g/dL (ref 12.0–15.0)
POTASSIUM: 4.1 mmol/L (ref 3.5–5.1)
SODIUM: 128 mmol/L — AB (ref 135–145)
TCO2: 34 mmol/L (ref 0–100)

## 2016-11-26 LAB — I-STAT CG4 LACTIC ACID, ED: LACTIC ACID, VENOUS: 1.79 mmol/L (ref 0.5–1.9)

## 2016-11-26 LAB — I-STAT TROPONIN, ED: Troponin i, poc: 0.89 ng/mL (ref 0.00–0.08)

## 2016-11-26 MED ORDER — PIPERACILLIN-TAZOBACTAM 3.375 G IVPB 30 MIN
3.3750 g | Freq: Once | INTRAVENOUS | Status: AC
  Administered 2016-11-26: 3.375 g via INTRAVENOUS
  Filled 2016-11-26: qty 50

## 2016-11-26 MED ORDER — ALBUTEROL (5 MG/ML) CONTINUOUS INHALATION SOLN
15.0000 mg/h | INHALATION_SOLUTION | RESPIRATORY_TRACT | Status: DC
  Administered 2016-11-26: 15 mg/h via RESPIRATORY_TRACT
  Filled 2016-11-26: qty 20

## 2016-11-26 MED ORDER — VANCOMYCIN HCL IN DEXTROSE 1-5 GM/200ML-% IV SOLN
1000.0000 mg | Freq: Once | INTRAVENOUS | Status: AC
  Administered 2016-11-27: 1000 mg via INTRAVENOUS
  Filled 2016-11-26: qty 200

## 2016-11-26 NOTE — Telephone Encounter (Signed)
Patient reports she began to have dyspnea while cleaning her home this afternoon. She was exposed to "fumes". She has a temperature of 101F. She denies any sore throat or sinus congestion. She did have tremors this afternoon. She denies any sweats but has had chills. She just used her home nebulizer and first used it at 1:30pm. She reports her breathing seems to have worsened on Trelegy versus her previous Symbicort. She is on oxygen continuously at baseline. Lives alone. She reports her daughter is ill as well and she isn't sure about sick contact. She was around multiple people twice this week in public. Patient denies any myalgias but is having a headache. Recommended evaluation in the closest Emergency Department. She is going to contact her son for transport otherwise I recommended EMS transport.

## 2016-11-26 NOTE — Progress Notes (Signed)
RT spoke with ED physician about patient wearing BiPAP at this time.  MD stated to hold off currently with the Bipap per the following results. RT will continue to monitor  Results for Lissa MoralesKEATON, Abie L (MRN [id number]) as of 11/26/2016 23:21  Ref. Range 11/26/2016 23:12  Sample type Unknown ARTERIAL  pH, Arterial Latest Ref Range: 7.350 - 7.450  7.355  pCO2 arterial Latest Ref Range: 32.0 - 48.0 mmHg 57.4 (H)  pO2, Arterial Latest Ref Range: 83.0 - 108.0 mmHg 156.0 (H)  TCO2 Latest Ref Range: 0 - 100 mmol/L 34  Acid-Base Excess Latest Ref Range: 0.0 - 2.0 mmol/L 5.0 (H)  Bicarbonate Latest Ref Range: 20.0 - 28.0 mmol/L 32.1 (H)  O2 Saturation Latest Units: % 99.0  Patient temperature Unknown 98.6 F  Collection site Unknown RADIAL, [facility] T.Marland Kitchen..Marland Kitchen

## 2016-11-26 NOTE — ED Provider Notes (Signed)
[facility]-EMERGENCY DEPT Provider Note   CSN: [id number] Arrival date & time: 11/26/16  2130     History   Chief Complaint Chief Complaint  Patient presents with  . COPD    HPI DORETHA GODING is a 71 y.o. female.  Fever. Shortness of breath.  The history is provided by the patient.  Shortness of Breath  This is a recurrent problem. The average episode lasts 2 hours. The problem occurs continuously.The current episode started 1 to 2 hours ago. The problem has been rapidly worsening. Associated symptoms include a fever and cough. Pertinent negatives include no sore throat, no ear pain, no chest pain, no vomiting, no abdominal pain and no rash. The problem's precipitants include strong odors. She has tried inhaled steroids and beta-agonist inhalers for the symptoms. The treatment provided mild relief. She has had prior hospitalizations. She has had prior ED visits. She has had prior ICU admissions. Associated medical issues include COPD.    Past Medical History:  Diagnosis Date  . Chronic airway obstruction, not elsewhere classified   . Glaucoma   . H/O chronic bronchitis   . Hypertension   . Macular degeneration   . Pneumonia   . Shortness of breath 10/08/11   "all the time last 1 1/2 wk"  . 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 Pneumonia 11/27/2016  . Elevated troponin 11/27/2016  . Hyperglycemia 11/27/2016  . Anemia 11/27/2016  . Sepsis (HCC) 11/27/2016  . Chronic respiratory failure with hypoxia and hypercapnia (HCC) 09/11/2016  . Acute on chronic respiratory failure with hypoxia (HCC) 11/25/2015  . Anxiety 11/25/2015  . Dependent edema 11/25/2015  . Tachycardia 11/25/2015  . Hyponatremia   . COPD with respiratory distress, acute (HCC)   . COPD with exacerbation (HCC)   . CAP (community acquired pneumonia) 03/13/2015  . Respiratory distress 03/13/2015  . Pain of left calf 03/16/2014  .  Neck pain 02/12/2014  . Allergic conjunctivitis and rhinitis 10/04/2012  . Chest pain radiating to arm 06/26/2011  . Essential hypertension 03/21/2009  . Chronic respiratory failure with hypoxia (HCC) 10/14/2007  . CT, CHEST, ABNORMAL 10/14/2007  . Tobacco use disorder, severe, in sustained remission 09/12/2007  . COPD GOLD III / 02 dep  09/12/2007    Past Surgical History:  Procedure Laterality Date  . DILATION AND CURETTAGE OF UTERUS  1970's  . OOPHORECTOMY  1986  . VAGINAL HYSTERECTOMY  1978   partial    OB History    No data available       Home Medications    Prior to Admission medications   Medication Sig Start Date End Date Taking? Authorizing Provider  albuterol (PROVENTIL HFA;VENTOLIN HFA) 108 (90 Base) MCG/ACT inhaler Inhale 2 puffs into the lungs every 6 (six) hours as needed for wheezing or shortness of breath. Please dispense generic. 12/02/15  Yes Costin Otelia Sergeant, MD  ALPRAZolam Prudy Feeler) 0.5 MG tablet TAKE 1 TABLET THREE TIMES DAILY AS NEEDED FOR ANXIETY 08/14/16  Yes Waymon Budge, MD  CALCIUM PO Take 1 tablet by mouth daily.   Yes Historical Provider, MD  Fluticasone-Umeclidin-Vilant (TRELEGY ELLIPTA) 100-62.5-25 MCG/INH AEPB Inhale 1 puff into the lungs daily. 11/04/16  Yes Waymon Budge, MD  ipratropium-albuterol (DUONEB) 0.5-2.5 (3) MG/3ML SOLN USE ONE VIAL VIA NEBULIZER 2 TIMES DAILY 03/31/16  Yes Waymon Budge, MD  lisinopril (PRINIVIL,ZESTRIL) 20 MG tablet Take 20 mg by mouth  daily.   Yes Historical Provider, MD  Multiple Vitamins-Minerals (PRESERVISION AREDS 2 PO) Take 2 capsules by mouth daily. Reported on 11/22/2015   Yes Historical Provider, MD    Family History Family History  Problem Relation Age of Onset  . Liver cancer Mother   . Emphysema Father     Social History Social History  Substance Use Topics  . Smoking status: Former Smoker    Packs/day: 0.50    Years: 40.00    Types: Cigarettes    Quit date: 06/22/2015  . Smokeless tobacco:  Former Neurosurgeon    Quit date: 02/26/2015     Comment: 4-5 cigs a day - QUIT Oct 2016  . Alcohol use 4.2 oz/week    7 Cans of beer per week     Allergies   Patient has no known allergies.   Review of Systems Review of Systems  Constitutional: Positive for fever. Negative for chills.  HENT: Negative for ear pain and sore throat.   Eyes: Negative for pain and visual disturbance.  Respiratory: Positive for cough and shortness of breath.   Cardiovascular: Negative for chest pain and palpitations.  Gastrointestinal: Negative for abdominal pain and vomiting.  Genitourinary: Negative for dysuria and hematuria.  Musculoskeletal: Negative for arthralgias and back pain.  Skin: Negative for color change and rash.  Neurological: Negative for seizures and syncope.  All other systems reviewed and are negative.    Physical Exam Updated Vital Signs BP (!) 112/45 (BP Location: Left Arm)   Pulse 94   Temp 99.3 F (37.4 C) (Oral)   Resp (!) 22   Ht 5' 1.2" (1.554 m)   Wt 53.3 kg   SpO2 98%   BMI 22.04 kg/m   Physical Exam  Constitutional: She appears well-developed and well-nourished. No distress.  HENT:  Head: Normocephalic and atraumatic.  Eyes: Conjunctivae are normal.  Neck: Neck supple.  Cardiovascular: Regular rhythm and intact distal pulses.   No murmur heard. Tachycardic  Pulmonary/Chest: She is in respiratory distress. She has wheezes.  Marked tachypnea and resp distress. Poor aeration throughout. Scant wheezes but overall very poor air movement  Abdominal: Soft. There is no tenderness.  Musculoskeletal: She exhibits no edema.  Neurological: She is alert.  Skin: Skin is warm and dry.  Psychiatric: She has a normal mood and affect.  Nursing note and vitals reviewed.    ED Treatments / Results  Labs (all labs ordered are listed, but only abnormal results are displayed) Labs Reviewed  CBC WITH DIFFERENTIAL/PLATELET - Abnormal; Notable for the following:       Result  Value   WBC 33.0 (*)    Hemoglobin 11.1 (*)    HCT 34.1 (*)    Neutro Abs 29.7 (*)    Monocytes Absolute 2.6 (*)    All other components within normal limits  BASIC METABOLIC PANEL - Abnormal; Notable for the following:    Sodium 128 (*)    Chloride 89 (*)    Glucose, Bld 179 (*)    All other components within normal limits  LACTIC ACID, PLASMA - Abnormal; Notable for the following:    Lactic Acid, Venous 5.6 (*)    All other components within normal limits  BASIC METABOLIC PANEL - Abnormal; Notable for the following:    Sodium 131 (*)    Chloride 93 (*)    Glucose, Bld 169 (*)    Calcium 8.5 (*)    All other components within normal limits  CBC WITH DIFFERENTIAL/PLATELET -  Abnormal; Notable for the following:    WBC 35.1 (*)    RBC 3.46 (*)    Hemoglobin 9.7 (*)    HCT 29.8 (*)    Neutro Abs 32.3 (*)    Monocytes Absolute 2.1 (*)    All other components within normal limits  TROPONIN I - Abnormal; Notable for the following:    Troponin I 1.19 (*)    All other components within normal limits  TROPONIN I - Abnormal; Notable for the following:    Troponin I 1.13 (*)    All other components within normal limits  TROPONIN I - Abnormal; Notable for the following:    Troponin I 1.28 (*)    All other components within normal limits  GLUCOSE, CAPILLARY - Abnormal; Notable for the following:    Glucose-Capillary 260 (*)    All other components within normal limits  APTT - Abnormal; Notable for the following:    aPTT 146 (*)    All other components within normal limits  OSMOLALITY - Abnormal; Notable for the following:    Osmolality 273 (*)    All other components within normal limits  OSMOLALITY, URINE - Abnormal; Notable for the following:    Osmolality, Ur 128 (*)    All other components within normal limits  GLUCOSE, CAPILLARY - Abnormal; Notable for the following:    Glucose-Capillary 151 (*)    All other components within normal limits  GLUCOSE, CAPILLARY - Abnormal;  Notable for the following:    Glucose-Capillary 166 (*)    All other components within normal limits  I-STAT CHEM 8, ED - Abnormal; Notable for the following:    Sodium 128 (*)    Chloride 87 (*)    Glucose, Bld 186 (*)    All other components within normal limits  I-STAT TROPOININ, ED - Abnormal; Notable for the following:    Troponin i, poc 0.89 (*)    All other components within normal limits  I-STAT ARTERIAL BLOOD GAS, ED - Abnormal; Notable for the following:    pCO2 arterial 57.4 (*)    pO2, Arterial 156.0 (*)    Bicarbonate 32.1 (*)    Acid-Base Excess 5.0 (*)    All other components within normal limits  CULTURE, BLOOD (ROUTINE X 2)  CULTURE, BLOOD (ROUTINE X 2)  GRAM STAIN  RESPIRATORY PANEL BY PCR  CULTURE, EXPECTORATED SPUTUM-ASSESSMENT  BRAIN NATRIURETIC PEPTIDE  INFLUENZA PANEL BY PCR (TYPE A & B)  PROCALCITONIN  PROTIME-INR  APTT  HIV ANTIBODY (ROUTINE TESTING)  STREP PNEUMONIAE URINARY ANTIGEN  HEPARIN LEVEL (UNFRACTIONATED)  PROTIME-INR  LACTIC ACID, PLASMA  SODIUM, URINE, RANDOM  URIC ACID  CREATININE, URINE, RANDOM  HEMOGLOBIN A1C  LEGIONELLA PNEUMOPHILA SEROGP 1 UR AG  I-STAT CG4 LACTIC ACID, ED    EKG  EKG Interpretation None       Radiology Ct Angio Chest Pe W Or Wo Contrast  Result Date: 11/27/2016 CLINICAL DATA:  Shortness of Breath EXAM: CT ANGIOGRAPHY CHEST WITH CONTRAST TECHNIQUE: Multidetector CT imaging of the chest was performed using the standard protocol during bolus administration of intravenous contrast. Multiplanar CT image reconstructions and MIPs were obtained to evaluate the vascular anatomy. CONTRAST:  65 mL Isovue 370. COMPARISON:  11/27/2015 FINDINGS: Cardiovascular: Thoracic aorta demonstrates some calcifications without aneurysmal dilatation or dissection. No significant cardiac enlargement is noted. Mild coronary calcifications are seen. The pulmonary artery shows a normal branching pattern. No filling defect to suggest  pulmonary embolism is seen. Mediastinum/Nodes: The thoracic inlet  is within normal limits. Scattered small hilar lymph nodes are seen. No significant mediastinal adenopathy is noted. The esophagus as visualized is within normal limits. Lungs/Pleura: Diffuse emphysematous changes are noted. In the left upper lobe there is an 8 mm somewhat lobular nodular density with surrounding increased infiltrate. Bilateral lower lobe infiltrates are noted right greater than left. Some associated retained secretions are noted within the bronchial tree bilaterally. No other focal nodular density is seen. Upper Abdomen: Visualized upper abdomen is within normal limits. Musculoskeletal: No acute bony abnormality is noted. Review of the MIP images confirms the above findings. IMPRESSION: No evidence of pulmonary embolism. Bilateral lower lobe infiltrates right greater than left without sizable effusion. Lobular nodular density in the left upper lobe with some surrounding infiltrate. This may all be postinflammatory in nature. Non-contrast chest CT at 6-12 months is recommended. If the nodule is stable at time of repeat CT, then future CT at 18-24 months (from today's scan) is considered optional for low-risk patients, but is recommended for high-risk patients. This recommendation follows the consensus statement: Guidelines for Management of Incidental Pulmonary Nodules Detected on CT Images: From the Fleischner Society 2017; Radiology 2017; 284:228-243. Electronically Signed   By: Alcide Clever M.D.   On: 11/27/2016 11:07   Dg Chest Port 1 View  Result Date: 11/27/2016 CLINICAL DATA:  Shortness of breath. EXAM: PORTABLE CHEST 1 VIEW COMPARISON:  11/26/2016 FINDINGS: Lungs are hyperinflated. Heart size is normal. There is patchy infiltrate involving the medial right lower lobe. Developing infiltrate also noted in the medial left lower lobe. No pulmonary edema. Remote left rib fracture. IMPRESSION: Developing bilateral lower lobe  infiltrates. Electronically Signed   By: Norva Pavlov M.D.   On: 11/27/2016 07:32   Dg Chest Portable 1 View  Result Date: 11/26/2016 CLINICAL DATA:  Shortness of breath, history of COPD EXAM: PORTABLE CHEST 1 VIEW COMPARISON:  09/11/2016 FINDINGS: Hyperinflation. Focal opacity at the right lung base with possible tiny right effusion. Normal cardiomediastinal silhouette with atherosclerosis. No pneumothorax. IMPRESSION: Hyperinflation with right basilar opacity suspicious for an infiltrate. Possible tiny right pleural effusion. Electronically Signed   By: Jasmine Pang M.D.   On: 11/26/2016 22:40    Procedures Procedures (including critical care time)  Medications Ordered in ED Medications  ALPRAZolam (XANAX) tablet 0.5 mg (0.5 mg Oral Given 11/27/16 0227)  ipratropium-albuterol (DUONEB) 0.5-2.5 (3) MG/3ML nebulizer solution 3 mL (3 mLs Nebulization Not Given 11/27/16 1511)  insulin aspart (novoLOG) injection 0-15 Units (3 Units Subcutaneous Given 11/27/16 0903)  insulin aspart (novoLOG) injection 0-5 Units (3 Units Subcutaneous Given 11/27/16 0240)  cefTRIAXone (ROCEPHIN) 1 g in dextrose 5 % 50 mL IVPB (1 g Intravenous Given 11/27/16 0904)  azithromycin (ZITHROMAX) 500 mg in dextrose 5 % 250 mL IVPB (500 mg Intravenous Given 11/27/16 1145)  heparin ADULT infusion 100 units/mL (25000 units/254mL sodium chloride 0.45%) (650 Units/hr Intravenous New Bag/Given 11/27/16 0430)  aspirin chewable tablet 81 mg (81 mg Oral Given 11/27/16 0902)  guaiFENesin-dextromethorphan (ROBITUSSIN DM) 100-10 MG/5ML syrup 10 mL (10 mLs Oral Given 11/27/16 1145)  methylPREDNISolone sodium succinate (SOLU-MEDROL) 125 mg/2 mL injection 60 mg (60 mg Intravenous Given 11/27/16 1145)  albuterol (PROVENTIL) (2.5 MG/3ML) 0.083% nebulizer solution 2.5 mg (2.5 mg Nebulization Given 11/27/16 1255)  nebivolol (BYSTOLIC) tablet 2.5 mg (2.5 mg Oral Given 11/27/16 1159)  0.9 %  sodium chloride infusion ( Intravenous New Bag/Given 11/27/16 1145)    mometasone-formoterol (DULERA) 200-5 MCG/ACT inhaler 2 puff (not administered)  feeding supplement (ENSURE  ENLIVE) (ENSURE ENLIVE) liquid 237 mL (not administered)  vancomycin (VANCOCIN) IVPB 1000 mg/200 mL premix (1,000 mg Intravenous Transfusing/Transfer 11/27/16 0131)  piperacillin-tazobactam (ZOSYN) IVPB 3.375 g (0 g Intravenous Stopped 11/27/16 0047)  sodium chloride 0.9 % bolus 1,000 mL (1,000 mLs Intravenous Given 11/27/16 0404)  heparin bolus via infusion 3,000 Units (3,000 Units Intravenous Bolus from Bag 11/27/16 0431)  sodium chloride 0.9 % bolus 500 mL (500 mLs Intravenous Given 11/27/16 0516)    And  sodium chloride 0.9 % bolus 250 mL (250 mLs Intravenous Given 11/27/16 0558)  iopamidol (ISOVUE-370) 76 % injection (65 mLs Intravenous Contrast Given 11/27/16 1041)     Initial Impression / Assessment and Plan / ED Course  I have reviewed the triage vital signs and the nursing notes.  Pertinent labs & imaging results that were available during my care of the patient were reviewed by me and considered in my medical decision making (see chart for details).    Pt with hx of COPD on 2-3 l/min home O2 p/w SOB. Started about 2 hours ago while using cleaning products. She noted she was febrile to 101 prior to arrival. Afebrile here. EMS gave 10mg  Albuterol and placed on CPAP just prior to arrival for desat. Here pt has poor aeration. Initially trialed on BiPAP. WOB improved and transitioned to [place]. Continuous duoneb given with mild improvement in aeration. Pt denies any chest pain, although labs notable for elevated troponin to 0.89. Pt continues to have increased WOB and increased oxygen requirement. CXR shows pneumonia. She was hospitalized recently for 1 week. Will cover for HCAP with Vanc/Zosyn. I spoke to hospitalast who will admit pt.  Final Clinical Impressions(s) / ED Diagnoses   Final diagnoses:  SOB (shortness of breath)    New Prescriptions Current Discharge Medication List        Lennette BihariJohn Michael Philopateer Strine, MD 11/27/16 1535    Arby BarretteMarcy Pfeiffer, MD 11/28/16 1752

## 2016-11-26 NOTE — ED Triage Notes (Signed)
Pt arrived via [facility] from home c/o SOB starting around 1300. EMS gave 10mg  albuterol 0.5mg  atrovent 125mg  solumedrol 2g magnesium Pt wears 3L O2 at home

## 2016-11-27 ENCOUNTER — Inpatient Hospital Stay (HOSPITAL_COMMUNITY): Payer: Medicare HMO

## 2016-11-27 ENCOUNTER — Encounter (HOSPITAL_COMMUNITY): Payer: Self-pay | Admitting: Internal Medicine

## 2016-11-27 DIAGNOSIS — I200 Unstable angina: Secondary | ICD-10-CM | POA: Diagnosis not present

## 2016-11-27 DIAGNOSIS — Z800 Family history of malignant neoplasm of digestive organs: Secondary | ICD-10-CM | POA: Diagnosis not present

## 2016-11-27 DIAGNOSIS — Z90710 Acquired absence of both cervix and uterus: Secondary | ICD-10-CM | POA: Diagnosis not present

## 2016-11-27 DIAGNOSIS — J9622 Acute and chronic respiratory failure with hypercapnia: Secondary | ICD-10-CM | POA: Diagnosis present

## 2016-11-27 DIAGNOSIS — R748 Abnormal levels of other serum enzymes: Secondary | ICD-10-CM

## 2016-11-27 DIAGNOSIS — J441 Chronic obstructive pulmonary disease with (acute) exacerbation: Secondary | ICD-10-CM | POA: Diagnosis present

## 2016-11-27 DIAGNOSIS — Z825 Family history of asthma and other chronic lower respiratory diseases: Secondary | ICD-10-CM | POA: Diagnosis not present

## 2016-11-27 DIAGNOSIS — R0602 Shortness of breath: Secondary | ICD-10-CM | POA: Diagnosis present

## 2016-11-27 DIAGNOSIS — R7989 Other specified abnormal findings of blood chemistry: Secondary | ICD-10-CM

## 2016-11-27 DIAGNOSIS — A419 Sepsis, unspecified organism: Secondary | ICD-10-CM | POA: Diagnosis present

## 2016-11-27 DIAGNOSIS — I509 Heart failure, unspecified: Secondary | ICD-10-CM | POA: Diagnosis not present

## 2016-11-27 DIAGNOSIS — I214 Non-ST elevation (NSTEMI) myocardial infarction: Secondary | ICD-10-CM | POA: Diagnosis not present

## 2016-11-27 DIAGNOSIS — J9621 Acute and chronic respiratory failure with hypoxia: Secondary | ICD-10-CM

## 2016-11-27 DIAGNOSIS — J189 Pneumonia, unspecified organism: Secondary | ICD-10-CM | POA: Diagnosis present

## 2016-11-27 DIAGNOSIS — J440 Chronic obstructive pulmonary disease with acute lower respiratory infection: Secondary | ICD-10-CM | POA: Diagnosis present

## 2016-11-27 DIAGNOSIS — R739 Hyperglycemia, unspecified: Secondary | ICD-10-CM | POA: Diagnosis present

## 2016-11-27 DIAGNOSIS — H3530 Unspecified macular degeneration: Secondary | ICD-10-CM | POA: Diagnosis present

## 2016-11-27 DIAGNOSIS — H409 Unspecified glaucoma: Secondary | ICD-10-CM | POA: Diagnosis present

## 2016-11-27 DIAGNOSIS — R778 Other specified abnormalities of plasma proteins: Secondary | ICD-10-CM | POA: Diagnosis present

## 2016-11-27 DIAGNOSIS — Z87891 Personal history of nicotine dependence: Secondary | ICD-10-CM | POA: Diagnosis not present

## 2016-11-27 DIAGNOSIS — I248 Other forms of acute ischemic heart disease: Secondary | ICD-10-CM | POA: Diagnosis present

## 2016-11-27 DIAGNOSIS — I10 Essential (primary) hypertension: Secondary | ICD-10-CM | POA: Diagnosis present

## 2016-11-27 DIAGNOSIS — F419 Anxiety disorder, unspecified: Secondary | ICD-10-CM | POA: Diagnosis present

## 2016-11-27 DIAGNOSIS — D638 Anemia in other chronic diseases classified elsewhere: Secondary | ICD-10-CM | POA: Diagnosis present

## 2016-11-27 DIAGNOSIS — T380X5A Adverse effect of glucocorticoids and synthetic analogues, initial encounter: Secondary | ICD-10-CM | POA: Diagnosis present

## 2016-11-27 DIAGNOSIS — I480 Paroxysmal atrial fibrillation: Secondary | ICD-10-CM

## 2016-11-27 DIAGNOSIS — R079 Chest pain, unspecified: Secondary | ICD-10-CM

## 2016-11-27 DIAGNOSIS — D649 Anemia, unspecified: Secondary | ICD-10-CM | POA: Diagnosis present

## 2016-11-27 DIAGNOSIS — Z9981 Dependence on supplemental oxygen: Secondary | ICD-10-CM | POA: Diagnosis not present

## 2016-11-27 DIAGNOSIS — I4819 Other persistent atrial fibrillation: Secondary | ICD-10-CM

## 2016-11-27 LAB — GLUCOSE, CAPILLARY
GLUCOSE-CAPILLARY: 260 mg/dL — AB (ref 65–99)
GLUCOSE-CAPILLARY: 166 mg/dL — AB (ref 65–99)
GLUCOSE-CAPILLARY: 122 mg/dL — AB (ref 65–99)
Glucose-Capillary: 151 mg/dL — ABNORMAL HIGH (ref 65–99)
Glucose-Capillary: 209 mg/dL — ABNORMAL HIGH (ref 65–99)

## 2016-11-27 LAB — RESPIRATORY PANEL BY PCR
Adenovirus: NOT DETECTED
BORDETELLA PERTUSSIS-RVPCR: NOT DETECTED
CHLAMYDOPHILA PNEUMONIAE-RVPPCR: NOT DETECTED
CORONAVIRUS 229E-RVPPCR: NOT DETECTED
CORONAVIRUS HKU1-RVPPCR: NOT DETECTED
Coronavirus NL63: NOT DETECTED
Coronavirus OC43: NOT DETECTED
INFLUENZA B-RVPPCR: NOT DETECTED
Influenza A: NOT DETECTED
Metapneumovirus: NOT DETECTED
Mycoplasma pneumoniae: NOT DETECTED
Parainfluenza Virus 1: NOT DETECTED
Parainfluenza Virus 2: NOT DETECTED
Parainfluenza Virus 3: NOT DETECTED
Parainfluenza Virus 4: NOT DETECTED
RESPIRATORY SYNCYTIAL VIRUS-RVPPCR: NOT DETECTED
Rhinovirus / Enterovirus: NOT DETECTED

## 2016-11-27 LAB — TROPONIN I
TROPONIN I: 1.19 ng/mL — AB (ref <0.03)
Troponin I: 1.13 ng/mL (ref <0.03)
Troponin I: 1.28 ng/mL (ref <0.03)

## 2016-11-27 LAB — BASIC METABOLIC PANEL
ANION GAP: 9 (ref 5–15)
BUN: 10 mg/dL (ref 6–20)
CALCIUM: 8.5 mg/dL — AB (ref 8.9–10.3)
CO2: 29 mmol/L (ref 22–32)
CREATININE: 0.58 mg/dL (ref 0.44–1.00)
Chloride: 93 mmol/L — ABNORMAL LOW (ref 101–111)
GFR calc Af Amer: >60 mL/min (ref >60)
GFR calc non Af Amer: >60 mL/min (ref >60)
GLUCOSE: 169 mg/dL — AB (ref 65–99)
Potassium: 3.9 mmol/L (ref 3.5–5.1)
Sodium: 131 mmol/L — ABNORMAL LOW (ref 135–145)

## 2016-11-27 LAB — PROTIME-INR
INR: 0.99
INR: 1.1
PROTHROMBIN TIME: 13.1 s (ref 11.4–15.2)
PROTHROMBIN TIME: 14.3 s (ref 11.4–15.2)

## 2016-11-27 LAB — CBC WITH DIFFERENTIAL/PLATELET
BASOS PCT: 0 %
Basophils Absolute: 0 10*3/uL (ref 0.0–0.1)
EOS ABS: 0 10*3/uL (ref 0.0–0.7)
Eosinophils Relative: 0 %
HCT: 29.8 % — ABNORMAL LOW (ref 36.0–46.0)
HEMOGLOBIN: 9.7 g/dL — AB (ref 12.0–15.0)
LYMPHS ABS: 0.7 10*3/uL (ref 0.7–4.0)
LYMPHS PCT: 2 %
MCH: 28 pg (ref 26.0–34.0)
MCHC: 32.6 g/dL (ref 30.0–36.0)
MCV: 86.1 fL (ref 78.0–100.0)
Monocytes Absolute: 2.1 10*3/uL — ABNORMAL HIGH (ref 0.1–1.0)
Monocytes Relative: 6 %
NEUTROS ABS: 32.3 10*3/uL — AB (ref 1.7–7.7)
Neutrophils Relative %: 92 %
Platelets: 230 10*3/uL (ref 150–400)
RBC: 3.46 MIL/uL — ABNORMAL LOW (ref 3.87–5.11)
RDW: 13.1 % (ref 11.5–15.5)
WBC: 35.1 10*3/uL — ABNORMAL HIGH (ref 4.0–10.5)

## 2016-11-27 LAB — PROCALCITONIN: PROCALCITONIN: 0.86 ng/mL

## 2016-11-27 LAB — INFLUENZA PANEL BY PCR (TYPE A & B)
Influenza A By PCR: NEGATIVE
Influenza B By PCR: NEGATIVE

## 2016-11-27 LAB — APTT
APTT: 35 s (ref 24–36)
aPTT: 146 seconds — ABNORMAL HIGH (ref 24–36)

## 2016-11-27 LAB — HEPARIN LEVEL (UNFRACTIONATED): Heparin Unfractionated: 0.51 IU/mL (ref 0.30–0.70)

## 2016-11-27 LAB — CREATININE, URINE, RANDOM: CREATININE, URINE: 16.44 mg/dL

## 2016-11-27 LAB — STREP PNEUMONIAE URINARY ANTIGEN: STREP PNEUMO URINARY ANTIGEN: NEGATIVE

## 2016-11-27 LAB — OSMOLALITY, URINE: OSMOLALITY UR: 128 mosm/kg — AB (ref 300–900)

## 2016-11-27 LAB — LACTIC ACID, PLASMA
Lactic Acid, Venous: 5.6 mmol/L (ref 0.5–1.9)
Lactic Acid, Venous: 1.8 mmol/L (ref 0.5–1.9)

## 2016-11-27 LAB — SODIUM, URINE, RANDOM: SODIUM UR: 14 mmol/L

## 2016-11-27 LAB — BRAIN NATRIURETIC PEPTIDE: B Natriuretic Peptide: 55.7 pg/mL (ref 0.0–100.0)

## 2016-11-27 LAB — HIV ANTIBODY (ROUTINE TESTING W REFLEX): HIV SCREEN 4TH GENERATION: NONREACTIVE

## 2016-11-27 LAB — OSMOLALITY: Osmolality: 273 mOsm/kg — ABNORMAL LOW (ref 275–295)

## 2016-11-27 LAB — ECHOCARDIOGRAM COMPLETE
Height: 61.2 in
Weight: 1878.4 oz

## 2016-11-27 LAB — URIC ACID: Uric Acid, Serum: 3 mg/dL (ref 2.3–6.6)

## 2016-11-27 MED ORDER — SODIUM CHLORIDE 0.9 % IV BOLUS (SEPSIS)
1000.0000 mL | Freq: Once | INTRAVENOUS | Status: AC
  Administered 2016-11-27: 1000 mL via INTRAVENOUS

## 2016-11-27 MED ORDER — INSULIN ASPART 100 UNIT/ML ~~LOC~~ SOLN
0.0000 [IU] | Freq: Three times a day (TID) | SUBCUTANEOUS | Status: DC
  Administered 2016-11-27: 3 [IU] via SUBCUTANEOUS
  Administered 2016-11-27: 5 [IU] via SUBCUTANEOUS
  Administered 2016-11-27: 3 [IU] via SUBCUTANEOUS
  Administered 2016-11-28: 2 [IU] via SUBCUTANEOUS
  Administered 2016-11-28: 5 [IU] via SUBCUTANEOUS
  Administered 2016-11-28 – 2016-11-29 (×2): 2 [IU] via SUBCUTANEOUS
  Administered 2016-11-29 – 2016-11-30 (×3): 3 [IU] via SUBCUTANEOUS
  Administered 2016-11-30: 5 [IU] via SUBCUTANEOUS
  Administered 2016-12-01: 3 [IU] via SUBCUTANEOUS
  Administered 2016-12-02: 2 [IU] via SUBCUTANEOUS
  Administered 2016-12-02: 3 [IU] via SUBCUTANEOUS
  Administered 2016-12-03: 2 [IU] via SUBCUTANEOUS

## 2016-11-27 MED ORDER — MOMETASONE FURO-FORMOTEROL FUM 200-5 MCG/ACT IN AERO
2.0000 | INHALATION_SPRAY | Freq: Two times a day (BID) | RESPIRATORY_TRACT | Status: DC
  Administered 2016-11-30 – 2016-12-03 (×6): 2 via RESPIRATORY_TRACT
  Filled 2016-11-27 (×4): qty 8.8

## 2016-11-27 MED ORDER — SODIUM CHLORIDE 0.9 % IV SOLN: INTRAVENOUS | Status: DC

## 2016-11-27 MED ORDER — DEXTROSE 5 % IV SOLN
500.0000 mg | INTRAVENOUS | Status: DC
  Administered 2016-11-27 – 2016-11-30 (×4): 500 mg via INTRAVENOUS
  Filled 2016-11-27 (×4): qty 500

## 2016-11-27 MED ORDER — HEPARIN (PORCINE) IN NACL 100-0.45 UNIT/ML-% IJ SOLN
750.0000 [IU]/h | INTRAMUSCULAR | Status: DC
  Administered 2016-11-27: 650 [IU]/h via INTRAVENOUS
  Administered 2016-11-28: 750 [IU]/h via INTRAVENOUS
  Filled 2016-11-27 (×2): qty 250

## 2016-11-27 MED ORDER — INSULIN ASPART 100 UNIT/ML ~~LOC~~ SOLN
0.0000 [IU] | Freq: Every day | SUBCUTANEOUS | Status: DC
  Administered 2016-11-27: 3 [IU] via SUBCUTANEOUS

## 2016-11-27 MED ORDER — SODIUM CHLORIDE 0.9 % IV BOLUS (SEPSIS)
250.0000 mL | Freq: Once | INTRAVENOUS | Status: AC
  Administered 2016-11-27: 250 mL via INTRAVENOUS

## 2016-11-27 MED ORDER — SODIUM CHLORIDE 0.9 % IV SOLN
INTRAVENOUS | Status: DC
  Administered 2016-11-27 (×2): via INTRAVENOUS

## 2016-11-27 MED ORDER — ASPIRIN 81 MG PO CHEW
81.0000 mg | CHEWABLE_TABLET | Freq: Every day | ORAL | Status: DC
  Administered 2016-11-27 – 2016-12-03 (×7): 81 mg via ORAL
  Filled 2016-11-27 (×7): qty 1

## 2016-11-27 MED ORDER — NEBIVOLOL HCL 2.5 MG PO TABS
2.5000 mg | ORAL_TABLET | Freq: Every day | ORAL | Status: DC
  Administered 2016-11-27 – 2016-12-03 (×7): 2.5 mg via ORAL
  Filled 2016-11-27 (×7): qty 1

## 2016-11-27 MED ORDER — DEXTROSE 5 % IV SOLN
500.0000 mg | Freq: Once | INTRAVENOUS | Status: DC
  Filled 2016-11-27: qty 500

## 2016-11-27 MED ORDER — METHYLPREDNISOLONE SODIUM SUCC 125 MG IJ SOLR
60.0000 mg | Freq: Three times a day (TID) | INTRAMUSCULAR | Status: DC
  Administered 2016-11-27 – 2016-11-29 (×9): 60 mg via INTRAVENOUS
  Filled 2016-11-27 (×9): qty 2

## 2016-11-27 MED ORDER — ENSURE ENLIVE PO LIQD
237.0000 mL | Freq: Two times a day (BID) | ORAL | Status: DC
  Administered 2016-11-27 – 2016-12-03 (×10): 237 mL via ORAL

## 2016-11-27 MED ORDER — SODIUM CHLORIDE 0.9 % IV BOLUS (SEPSIS)
500.0000 mL | Freq: Once | INTRAVENOUS | Status: AC
  Administered 2016-11-27: 500 mL via INTRAVENOUS

## 2016-11-27 MED ORDER — IOPAMIDOL (ISOVUE-370) INJECTION 76%
INTRAVENOUS | Status: AC
Start: 2016-11-27 — End: 2016-11-27
  Administered 2016-11-27: 65 mL via INTRAVENOUS
  Filled 2016-11-27: qty 100

## 2016-11-27 MED ORDER — DEXTROSE 5 % IV SOLN
1.0000 g | INTRAVENOUS | Status: DC
  Administered 2016-11-27 – 2016-12-01 (×5): 1 g via INTRAVENOUS
  Filled 2016-11-27 (×5): qty 10

## 2016-11-27 MED ORDER — GUAIFENESIN-DM 100-10 MG/5ML PO SYRP
10.0000 mL | ORAL_SOLUTION | ORAL | Status: DC | PRN
  Administered 2016-11-27 – 2016-11-28 (×2): 10 mL via ORAL
  Filled 2016-11-27 (×3): qty 10

## 2016-11-27 MED ORDER — SODIUM CHLORIDE 0.9 % IV BOLUS (SEPSIS): 1000.0000 mL | Freq: Once | INTRAVENOUS | Status: DC

## 2016-11-27 MED ORDER — ALPRAZOLAM 0.5 MG PO TABS
0.5000 mg | ORAL_TABLET | Freq: Three times a day (TID) | ORAL | Status: DC | PRN
  Administered 2016-11-27 – 2016-12-03 (×14): 0.5 mg via ORAL
  Filled 2016-11-27 (×14): qty 1

## 2016-11-27 MED ORDER — IPRATROPIUM-ALBUTEROL 0.5-2.5 (3) MG/3ML IN SOLN
3.0000 mL | Freq: Four times a day (QID) | RESPIRATORY_TRACT | Status: DC
  Administered 2016-11-27 – 2016-11-28 (×6): 3 mL via RESPIRATORY_TRACT
  Filled 2016-11-27 (×7): qty 3

## 2016-11-27 MED ORDER — DEXTROSE 5 % IV SOLN
1.0000 g | Freq: Once | INTRAVENOUS | Status: DC
  Filled 2016-11-27: qty 10

## 2016-11-27 MED ORDER — ENOXAPARIN SODIUM 40 MG/0.4ML ~~LOC~~ SOLN: 40.0000 mg | SUBCUTANEOUS | Status: DC

## 2016-11-27 MED ORDER — HEPARIN BOLUS VIA INFUSION
3000.0000 [IU] | Freq: Once | INTRAVENOUS | Status: AC
  Administered 2016-11-27: 3000 [IU] via INTRAVENOUS
  Filled 2016-11-27: qty 3000

## 2016-11-27 MED ORDER — SODIUM CHLORIDE 0.9 % IV BOLUS (SEPSIS): 500.0000 mL | Freq: Once | INTRAVENOUS | Status: DC

## 2016-11-27 MED ORDER — ALBUTEROL SULFATE (2.5 MG/3ML) 0.083% IN NEBU
2.5000 mg | INHALATION_SOLUTION | RESPIRATORY_TRACT | Status: DC | PRN
  Administered 2016-11-27 – 2016-12-01 (×5): 2.5 mg via RESPIRATORY_TRACT
  Filled 2016-11-27 (×5): qty 3

## 2016-11-27 NOTE — Progress Notes (Signed)
Patient arrived to [phone] via stretcher from ED.  Patient short of breath and having pursed lip breathing. Oxygen saturation 96% on 4L oxygen.  Respiratory called to bedside and patient received breathing treatment.  Rapid response also aware of patient.  Patient oriented to room, call bell, and phone, and all questions answered.  Will continue to monitor.

## 2016-11-27 NOTE — Consult Note (Signed)
CARDIOLOGY CONSULT NOTE   Patient ID: [name] MRN: [id number] DOB/AGE: 11/18/1945 71 y.o.  Admit date: 11/26/2016  Requesting Physician: Dr. Thedore Mins Primary Physician:   [name], MD Primary Cardiologist:   New Reason for Consultation:   Elevated troponin  HPI: [name] is a 71 y.o. female with a history of previous heavy tobacco abuse (quit 2016), COPD on 2.5L home O2, HTN, and macular degeneration who presented to [facility] on 11/27/16 with SOB and fevers. She was found to have sepsis 2/2 PNA and AECOPD. Cardiology consulted for elevated troponin.    She was admitted to [facility] in 11/2015 for AECOPD. 2D ECHO at that time showed normal LV function, G1DD, mild MR/TR. Since this admission she has been on continuous home 02.   She was in her usual state of health until 11/26/16 when she began to have dyspnea after cleaning her home. She checked her temperature which was 101F.  She presented with sepsis picture with tachycardia, fevers, hypotension, leukocytosis and lactate of 5.6. CXR showed bilateral lower lobe infiltrates. CTA negative for acute PE, but did show bilateral lower lobe infiltrates and a  pulmonary nodule in left upper lobe. She initially required BiPAP and placed on empiric antibiotics along with IV steroids, nebs and supportive care. She quickly improved and now stable. Lactic acid now improved to normal, BPs improved and fever resolved. Apparently she has poor IV access and required PICC placement.   She is resting comfortably in bed on 02. She states she feels fine unless she gets up to walk around, which makes her SOB. No CP. No LE edema, orthopnea or PND. She chronically sleeps on two pillows due to comfort. No dizziness or syncope. No blood in stool or urine. No palpitations. She is not very active but does go to the grocery store and [employer] as well as participate in silver sneakers ~1x/week. She denies ever getting exertional chest pain but does get SOB.      Past Medical History:  Diagnosis Date  . Chronic airway obstruction, not elsewhere classified   . Glaucoma   . H/O chronic bronchitis   . Hypertension   . Macular degeneration   . Pneumonia   . Shortness of breath 10/08/11   "all the time last 1 1/2 wk"  . Ventilator dependent [facility]) 2004; 2008   "for mucous plugs"     Past Surgical History:  Procedure Laterality Date  . DILATION AND CURETTAGE OF UTERUS  1970's  . OOPHORECTOMY  1986  . VAGINAL HYSTERECTOMY  1978   partial    No Known Allergies  I have reviewed the patient's current medications . aspirin  81 mg Oral Daily  . azithromycin  500 mg Intravenous Q24H  . cefTRIAXone (ROCEPHIN)  IV  1 g Intravenous Q24H  . insulin aspart  0-15 Units Subcutaneous TID WC  . insulin aspart  0-5 Units Subcutaneous QHS  . ipratropium-albuterol  3 mL Nebulization Q6H  . methylPREDNISolone (SOLU-MEDROL) injection  60 mg Intravenous TID  . nebivolol  2.5 mg Oral Daily   . sodium chloride 50 mL/hr at 11/27/16 1145  . heparin 650 Units/hr (11/27/16 0430)   albuterol, ALPRAZolam, guaiFENesin-dextromethorphan  Prior to Admission medications   Medication Sig Start Date End Date Taking? Authorizing Provider  albuterol (PROVENTIL HFA;VENTOLIN HFA) 108 (90 Base) MCG/ACT inhaler Inhale 2 puffs into the lungs every 6 (six) hours as needed for wheezing or shortness of breath. Please dispense generic. 12/02/15  Yes  Costin Otelia Sergeant, MD  ALPRAZolam Prudy Feeler) 0.5 MG tablet TAKE 1 TABLET THREE TIMES DAILY AS NEEDED FOR ANXIETY 08/14/16  Yes Waymon Budge, MD  CALCIUM PO Take 1 tablet by mouth daily.   Yes Historical Provider, MD  Fluticasone-Umeclidin-Vilant (TRELEGY ELLIPTA) 100-62.5-25 MCG/INH AEPB Inhale 1 puff into the lungs daily. 11/04/16  Yes Waymon Budge, MD  ipratropium-albuterol (DUONEB) 0.5-2.5 (3) MG/3ML SOLN USE ONE VIAL VIA NEBULIZER 2 TIMES DAILY 03/31/16  Yes Waymon Budge, MD  lisinopril (PRINIVIL,ZESTRIL) 20 MG tablet Take 20  mg by mouth daily.   Yes Historical Provider, MD  Multiple Vitamins-Minerals (PRESERVISION AREDS 2 PO) Take 2 capsules by mouth daily. Reported on 11/22/2015   Yes Historical Provider, MD     Social History   Social History  . Marital status: Married    Spouse name: N/A  . Number of children: N/A  . Years of education: N/A   Occupational History  . Retired    Social History Main Topics  . Smoking status: Former Smoker    Packs/day: 0.50    Years: 40.00    Types: Cigarettes    Quit date: 06/22/2015  . Smokeless tobacco: Former Neurosurgeon    Quit date: 02/26/2015     Comment: 4-5 cigs a day - QUIT Oct 2016  . Alcohol use 4.2 oz/week    7 Cans of beer per week  . Drug use: No  . Sexual activity: No   Other Topics Concern  . Not on file   Social History Narrative  . No narrative on file    Family Status  Relation Status  . Mother   . Father    Family History  Problem Relation Age of Onset  . Liver cancer Mother   . Emphysema Father        ROS:  Full 14 point review of systems complete and found to be negative unless listed above.  Physical Exam: Blood pressure (!) 103/44, pulse 97, temperature 98.3 F (36.8 C), temperature source Oral, resp. rate (!) 26, height 5' 1.2" (1.554 m), weight 117 lb 6.4 oz (53.3 kg), SpO2 99 %.  General: Well developed, well nourished, female in no acute distress Head: Eyes PERRLA, No xanthomas.   Normocephalic and atraumatic, oropharynx without edema or exudate.  Lungs: mild diffuse wheezes Heart: HRRR S1 S2, no rub/gallop, Heart regular rate and rhythm with S1, S2 no murmur. pulses are 2+ extrem.   Neck: No carotid bruits. No lymphadenopathy. No JVD. Abdomen: Bowel sounds present, abdomen soft and non-tender without masses or hernias noted. Msk:  No spine or cva tenderness. No weakness, no joint deformities or effusions. Extremities: No clubbing or cyanosis. No LE edema.  Neuro: Alert and oriented X 3. No focal deficits noted. Psych:  Good  affect, responds appropriately Skin: No rashes or lesions noted.  Labs:   Lab Results  Component Value Date   WBC 35.1 (H) 11/27/2016   HGB 9.7 (L) 11/27/2016   HCT 29.8 (L) 11/27/2016   MCV 86.1 11/27/2016   PLT 230 11/27/2016    Recent Labs  11/27/16 0948  INR 1.10    Recent Labs Lab 11/27/16 0948  NA 131*  K 3.9  CL 93*  CO2 29  BUN 10  CREATININE 0.58  CALCIUM 8.5*  GLUCOSE 169*   Magnesium  Date Value Ref Range Status  11/25/2015 2.4 1.7 - 2.4 mg/dL Final    Recent Labs  16/10/96 0245 11/27/16 0948  TROPONINI 1.19* 1.13*  Recent Labs  11/26/16 2315  TROPIPOC 0.89*   Pro B Natriuretic peptide (BNP)  Date/Time Value Ref Range Status  12/12/2014 10:06 AM 33.0 0.0 - 100.0 pg/mL Final  10/08/2011 03:36 PM 86.0 0 - 125 pg/mL Final   Lab Results  Component Value Date   CHOL (H) 04/07/2007    222        ATP III CLASSIFICATION:  <200     mg/dL   Desirable  829-562  mg/dL   Borderline High  >=130    mg/dL   High   HDL 87 86/57/8469   LDLCALC  04/07/2007    96        Total Cholesterol/HDL:CHD Risk Coronary Heart Disease Risk Table                     Men   Women  1/2 Average Risk   3.4   3.3   TRIG 197 (H) 04/07/2007   Lab Results  Component Value Date   DDIMER 0.41 03/15/2015   Echo: 11/29/2015 LV EF: 60% -   65% Study Conclusions - Left ventricle: The cavity size was normal. Systolic function was   normal. The estimated ejection fraction was in the range of 60%   to 65%. Wall motion was normal; there were no regional wall   motion abnormalities. Doppler parameters are consistent with   abnormal left ventricular relaxation (grade 1 diastolic   dysfunction). There was no evidence of elevated ventricular   filling pressure by Doppler parameters. - Aortic valve: Trileaflet; normal thickness leaflets. There was   trivial regurgitation. - Aortic root: The aortic root was normal in size. - Mitral valve: Structurally normal valve. There was  mild   regurgitation. - Left atrium: The atrium was normal in size. - Right ventricle: The cavity size was normal. Wall thickness was   normal. - Right atrium: The atrium was normal in size. - Tricuspid valve: There was mild regurgitation. - Pulmonic valve: There was no regurgitation. - Pulmonary arteries: The main pulmonary artery was normal-sized.   Systolic pressure was within the normal range. - Inferior vena cava: The vessel was normal in size. - Pericardium, extracardiac: A trivial pericardial effusion was   identified posterior to the heart. Features were not consistent   with tamponade physiology.  ECG:  Sinus tachy HR 118 with some inferior ST depression -personally reviewed  TELE: sinus tachy, sinus, and possible afib (Dr. Mayford Knife to review) - personally review.   Radiology:  Ct Angio Chest Pe W Or Wo Contrast  Result Date: 11/27/2016 CLINICAL DATA:  Shortness of Breath EXAM: CT ANGIOGRAPHY CHEST WITH CONTRAST TECHNIQUE: Multidetector CT imaging of the chest was performed using the standard protocol during bolus administration of intravenous contrast. Multiplanar CT image reconstructions and MIPs were obtained to evaluate the vascular anatomy. CONTRAST:  65 mL Isovue 370. COMPARISON:  11/27/2015 FINDINGS: Cardiovascular: Thoracic aorta demonstrates some calcifications without aneurysmal dilatation or dissection. No significant cardiac enlargement is noted. Mild coronary calcifications are seen. The pulmonary artery shows a normal branching pattern. No filling defect to suggest pulmonary embolism is seen. Mediastinum/Nodes: The thoracic inlet is within normal limits. Scattered small hilar lymph nodes are seen. No significant mediastinal adenopathy is noted. The esophagus as visualized is within normal limits. Lungs/Pleura: Diffuse emphysematous changes are noted. In the left upper lobe there is an 8 mm somewhat lobular nodular density with surrounding increased infiltrate. Bilateral lower  lobe infiltrates are noted right greater than left. Some associated  retained secretions are noted within the bronchial tree bilaterally. No other focal nodular density is seen. Upper Abdomen: Visualized upper abdomen is within normal limits. Musculoskeletal: No acute bony abnormality is noted. Review of the MIP images confirms the above findings. IMPRESSION: No evidence of pulmonary embolism. Bilateral lower lobe infiltrates right greater than left without sizable effusion. Lobular nodular density in the left upper lobe with some surrounding infiltrate. This may all be postinflammatory in nature. Non-contrast chest CT at 6-12 months is recommended. If the nodule is stable at time of repeat CT, then future CT at 18-24 months (from today's scan) is considered optional for low-risk patients, but is recommended for high-risk patients. This recommendation follows the consensus statement: Guidelines for Management of Incidental Pulmonary Nodules Detected on CT Images: From the Fleischner Society 2017; Radiology 2017; 284:228-243. Electronically Signed   By: Alcide CleverMark  Lukens M.D.   On: 11/27/2016 11:07   Dg Chest Port 1 View  Result Date: 11/27/2016 CLINICAL DATA:  Shortness of breath. EXAM: PORTABLE CHEST 1 VIEW COMPARISON:  11/26/2016 FINDINGS: Lungs are hyperinflated. Heart size is normal. There is patchy infiltrate involving the medial right lower lobe. Developing infiltrate also noted in the medial left lower lobe. No pulmonary edema. Remote left rib fracture. IMPRESSION: Developing bilateral lower lobe infiltrates. Electronically Signed   By: Norva PavlovElizabeth  Brown M.D.   On: 11/27/2016 07:32   Dg Chest Portable 1 View  Result Date: 11/26/2016 CLINICAL DATA:  Shortness of breath, history of COPD EXAM: PORTABLE CHEST 1 VIEW COMPARISON:  09/11/2016 FINDINGS: Hyperinflation. Focal opacity at the right lung base with possible tiny right effusion. Normal cardiomediastinal silhouette with atherosclerosis. No pneumothorax.  IMPRESSION: Hyperinflation with right basilar opacity suspicious for an infiltrate. Possible tiny right pleural effusion. Electronically Signed   By: Jasmine PangKim  Fujinaga M.D.   On: 11/26/2016 22:40    ASSESSMENT AND PLAN:    Principal Problem:   Acute on chronic respiratory failure with hypoxia (HCC) Active Problems:   Essential hypertension   Anxiety   Tachycardia   Pneumonia   Elevated troponin   Hyperglycemia   Anemia   Sepsis (HCC)  [name] is a 71 y.o. female with a history of previous heavy tobacco abuse (quit 2016), COPD on 2.5L home O2, HTN, and macular degeneration who [facility] with SOB and fevers. She was found to have sepsis 2/2 PNA and AECOPD. Cardiology consulted for elevated troponin.  Elevated troponin: 0.89--> 1.19--> 1.13. IM placed on heparin. She denies any chest pain. CTA did show some mild calcifications of aorta and coronary arteries. This likely represents demand ischemia in the setting of acute respiratory failure and sepsis. However, a little higher than I would have expected. ECG on admission showed sinus tachy with some subtle ST depression. 2D ECHO pending. If LV function preserved will plan for out patient or prior to discharge nuc. If EF down, she may need a cath  HTN: initially hypotensive. Now BP has improved. Home ARB on hold. IM has added low dose of bystolic.   Sepsis & Acute on chronic hypoxic respiratory failure secondary to pneumonia with COPD exacerbation in a patient with advanced COPD onto a half to 3 L nasal Oxygen at Home: she initially required BiPAP and placed on empiric antibiotics along with IV steroids, nebs and supportive care. She quickly improved and now stable  Possible pulmonary nodule: this will be followed by her pulmonologist, Dr. Lovell SheehanJenkins   Acute on chronic leukocytosis: felt to be 2/2 acute  infection and steroids for AECOPD. She has also had some chronic leukocytosis as well. Plan is for heme follow up after  discharge.   Possible afib/flutter on tele: ecg with sinus. Now back in sinus by tele. Will review with Dr. Mayford Knife. CHADSVASC at least 4 (HTN, age, vasc dz (noted on CT scan), [place]). Currently on ACS dosing of IV heparin. Continue for now.    Signed: Cline Crock, PA-C 11/27/2016 1:22 PM  Pager [phone]  Co-Sign MD

## 2016-11-27 NOTE — Consult Note (Signed)
Name: [name] MRN: [id number] DOB: 11-Feb-1946    ADMISSION DATE:  11/26/2016 CONSULTATION DATE: 11/27/2016  REFERRING MD :  Dr. Thedore Mins  CHIEF COMPLAINT:  Dyspnea    BRIEF PATIENT DESCRIPTION:  71 year old female with PMH of COPD mixed type/Gold D (former smoker), (on 2-3L [place] at baseline, followed by Fannie Knee), HTN, and glaucoma. Presents to ED on 3/8 with progressive dyspnea, patient reports that she was cleaning her house when this suddenly onset. At baseline she reports that she will get short of breath, however, is able to recover after a few minutes. Upon arrival to ED patient had a temp of 101, WBC 33, and required use of BIPAP. PCCM was consulted to assist with continued hypoxia.   Of note patient had COPD exacerbation on 1/5 where she was changed to Trelegy Inhaler > patient reports that she would like to go back on Symbicort as she feels like her symptoms were better controlled then.   SIGNIFICANT EVENTS  3/8 > Presents to ED with Dyspnea   STUDIES:  CXR 3/8 > Hyperinflation, focal opacity at the right lung base with possible right effusion CXR 3/9 > Developing bilateral lower lobe infiltrates  CTA Chest 3/9 > Neg PE, Lobular nodule density in the left upper lobe with surrounding infiltrate  Microbiology: Blood 3/8 >> Sputum 3/8 >>   PAST MEDICAL HISTORY :   has a past medical history of Chronic airway obstruction, not elsewhere classified; Glaucoma; H/O chronic bronchitis; Hypertension; Macular degeneration; Pneumonia; Shortness of breath (10/08/11); and Ventilator dependent (HCC) (2004; 2008).  has a past surgical history that includes Oophorectomy (1986); Vaginal hysterectomy (1978); and Dilation and curettage of uterus (1970's). Prior to Admission medications   Medication Sig Start Date End Date Taking? Authorizing Provider  albuterol (PROVENTIL HFA;VENTOLIN HFA) 108 (90 Base) MCG/ACT inhaler Inhale 2 puffs into the lungs every 6 (six) hours as needed for wheezing  or shortness of breath. Please dispense generic. 12/02/15  Yes Costin Otelia Sergeant, MD  ALPRAZolam Prudy Feeler) 0.5 MG tablet TAKE 1 TABLET THREE TIMES DAILY AS NEEDED FOR ANXIETY 08/14/16  Yes Waymon Budge, MD  CALCIUM PO Take 1 tablet by mouth daily.   Yes Historical Provider, MD  Fluticasone-Umeclidin-Vilant (TRELEGY ELLIPTA) 100-62.5-25 MCG/INH AEPB Inhale 1 puff into the lungs daily. 11/04/16  Yes Waymon Budge, MD  ipratropium-albuterol (DUONEB) 0.5-2.5 (3) MG/3ML SOLN USE ONE VIAL VIA NEBULIZER 2 TIMES DAILY 03/31/16  Yes Waymon Budge, MD  lisinopril (PRINIVIL,ZESTRIL) 20 MG tablet Take 20 mg by mouth daily.   Yes Historical Provider, MD  Multiple Vitamins-Minerals (PRESERVISION AREDS 2 PO) Take 2 capsules by mouth daily. Reported on 11/22/2015   Yes Historical Provider, MD   No Known Allergies  FAMILY HISTORY:  family history includes Emphysema in her father; Liver cancer in her mother. SOCIAL HISTORY:  reports that she quit smoking about 17 months ago. Her smoking use included Cigarettes. She has a 20.00 pack-year smoking history. She quit smokeless tobacco use about 21 months ago. She reports that she drinks about 4.2 oz of alcohol per week . She reports that she does not use drugs.  REVIEW OF SYSTEMS:   All negative; except for those that are bolded, which indicate positives.  Constitutional: weight loss, weight gain, night sweats, fevers, chills, fatigue, weakness.  HEENT: headaches, sore throat, sneezing, nasal congestion, post nasal drip, difficulty swallowing, tooth/dental problems, visual complaints, visual changes, ear aches. Neuro: difficulty with speech, weakness, numbness, ataxia. CV:  chest  pain, orthopnea, PND, swelling in lower extremities, dizziness, palpitations, syncope.  Resp: cough, hemoptysis, dyspnea, wheezing. GI: heartburn, indigestion, abdominal pain, nausea, vomiting, diarrhea, constipation, change in bowel habits, loss of appetite, hematemesis, melena,  hematochezia.  GU: dysuria, change in color of urine, urgency or frequency, flank pain, hematuria. MSK: joint pain or swelling, decreased range of motion. Psych: change in mood or affect, depression, anxiety, suicidal ideations, homicidal ideations. Skin: rash, itching, bruising.   SUBJECTIVE:  Patient reports dyspnea overall has improved, however she is no at her baseline. Reports a productive cough that began toady   VITAL SIGNS: Temp:  [97.8 F (36.6 C)-98.3 F (36.8 C)] 98.3 F (36.8 C) (03/09 0441) Pulse Rate:  [97-132] 97 (03/09 0647) Resp:  [15-30] 26 (03/09 0441) BP: (95-143)/(41-66) 103/44 (03/09 0647) SpO2:  [94 %-100 %] 99 % (03/09 0805) Weight:  [53.1 kg (117 lb)-53.3 kg (117 lb 6.4 oz)] 53.3 kg (117 lb 6.4 oz) (03/09 0149)  PHYSICAL EXAMINATION: General:  Adult female, sitting in bed  Neuro:  Alert, oriented, grossly intact  HEENT:  normocephalic  Cardiovascular:  RRR, no MRG, NI S1/S2  Lungs:  Exp Wheezes, non-labored  Abdomen:  Non-tender, non-distended, active bowel sounds  Musculoskeletal:  No acute  Skin:  Warm, dry, intact    Recent Labs Lab 11/26/16 2307 11/26/16 2316 11/27/16 0948  NA 128* 128* 131*  K 4.2 4.1 3.9  CL 89* 87* 93*  CO2 30  --  29  BUN 11 14 10   CREATININE 0.73 0.60 0.58  GLUCOSE 179* 186* 169*    Recent Labs Lab 11/26/16 2307 11/26/16 2316 11/27/16 0948  HGB 11.1* 12.2 9.7*  HCT 34.1* 36.0 29.8*  WBC 33.0*  --  35.1*  PLT 281  --  230   Ct Angio Chest Pe W Or Wo Contrast  Result Date: 11/27/2016 CLINICAL DATA:  Shortness of Breath EXAM: CT ANGIOGRAPHY CHEST WITH CONTRAST TECHNIQUE: Multidetector CT imaging of the chest was performed using the standard protocol during bolus administration of intravenous contrast. Multiplanar CT image reconstructions and MIPs were obtained to evaluate the vascular anatomy. CONTRAST:  65 mL Isovue 370. COMPARISON:  11/27/2015 FINDINGS: Cardiovascular: Thoracic aorta demonstrates some  calcifications without aneurysmal dilatation or dissection. No significant cardiac enlargement is noted. Mild coronary calcifications are seen. The pulmonary artery shows a normal branching pattern. No filling defect to suggest pulmonary embolism is seen. Mediastinum/Nodes: The thoracic inlet is within normal limits. Scattered small hilar lymph nodes are seen. No significant mediastinal adenopathy is noted. The esophagus as visualized is within normal limits. Lungs/Pleura: Diffuse emphysematous changes are noted. In the left upper lobe there is an 8 mm somewhat lobular nodular density with surrounding increased infiltrate. Bilateral lower lobe infiltrates are noted right greater than left. Some associated retained secretions are noted within the bronchial tree bilaterally. No other focal nodular density is seen. Upper Abdomen: Visualized upper abdomen is within normal limits. Musculoskeletal: No acute bony abnormality is noted. Review of the MIP images confirms the above findings. IMPRESSION: No evidence of pulmonary embolism. Bilateral lower lobe infiltrates right greater than left without sizable effusion. Lobular nodular density in the left upper lobe with some surrounding infiltrate. This may all be postinflammatory in nature. Non-contrast chest CT at 6-12 months is recommended. If the nodule is stable at time of repeat CT, then future CT at 18-24 months (from today's scan) is considered optional for low-risk patients, but is recommended for high-risk patients. This recommendation follows the consensus statement: Guidelines for  Management of Incidental Pulmonary Nodules Detected on CT Images: From the Fleischner Society 2017; Radiology 2017; 284:228-243. Electronically Signed   By: Alcide Clever M.D.   On: 11/27/2016 11:07   Dg Chest Port 1 View  Result Date: 11/27/2016 CLINICAL DATA:  Shortness of breath. EXAM: PORTABLE CHEST 1 VIEW COMPARISON:  11/26/2016 FINDINGS: Lungs are hyperinflated. Heart size is  normal. There is patchy infiltrate involving the medial right lower lobe. Developing infiltrate also noted in the medial left lower lobe. No pulmonary edema. Remote left rib fracture. IMPRESSION: Developing bilateral lower lobe infiltrates. Electronically Signed   By: Norva Pavlov M.D.   On: 11/27/2016 07:32   Dg Chest Portable 1 View  Result Date: 11/26/2016 CLINICAL DATA:  Shortness of breath, history of COPD EXAM: PORTABLE CHEST 1 VIEW COMPARISON:  09/11/2016 FINDINGS: Hyperinflation. Focal opacity at the right lung base with possible tiny right effusion. Normal cardiomediastinal silhouette with atherosclerosis. No pneumothorax. IMPRESSION: Hyperinflation with right basilar opacity suspicious for an infiltrate. Possible tiny right pleural effusion. Electronically Signed   By: Jasmine Pang M.D.   On: 11/26/2016 22:40    ASSESSMENT / PLAN:  Acute on Chronic hypoxic respiratory Failure +/- AECOPD, +/- CAP, +/- Viral Source  -Influenza Negative  Plan  -Maintain Oxygenation >90 -Pulmonary Hygiene  -Continue Antibiotics  -Send Sputum sample  -Send RVP panel  -Continued Scheduled Dounebs  -Start Dulera BID  -Continue Solu-Medrol 60 q8h   Jovita Kussmaul, AG-ACNP [facility]  Pgr: [phone]  PCCM Pgr: [phone]

## 2016-11-27 NOTE — Progress Notes (Signed)
Pharmacy Antibiotic Note  Lissa MoralesFrances L Cale is a 71 y.o. female admitted on 11/26/2016 with CAP.  Pharmacy has been consulted for Azithromycin and Rocephin dosing.  Plan: Rocephin 1gm IV q24h Azithromycin 500mg  IV q24h Pharmacy will sign off - please reconsult if needed  Height: 5' 1.2" (155.4 cm) Weight: 117 lb 6.4 oz (53.3 kg) IBW/kg (Calculated) : 48.26  Temp (24hrs), Avg:97.8 F (36.6 C), Min:97.8 F (36.6 C), Max:97.8 F (36.6 C)   Recent Labs Lab 11/26/16 2307 11/26/16 2316  WBC 33.0*  --   CREATININE 0.73 0.60  LATICACIDVEN  --  1.79    Estimated Creatinine Clearance: 49.2 mL/min (by C-G formula based on SCr of 0.6 mg/dL).    No Known Allergies  Thank you for allowing pharmacy to be a part of this patient's care.  Christoper Fabianaron Essense Bousquet, PharmD, BCPS Clinical pharmacist, pager [phone] 11/27/2016 1:58 AM

## 2016-11-27 NOTE — Progress Notes (Signed)
ANTICOAGULATION CONSULT NOTE  Pharmacy Consult for Heparin Indication: NSTEMI  No Known Allergies  Patient Measurements: Height: 5' 1.2" (155.4 cm) Weight: 117 lb 6.4 oz (53.3 kg) IBW/kg (Calculated) : 48.26  Vital Signs: Temp: 98.3 F (36.8 C) (03/09 0441) Temp Source: Oral (03/09 0441) BP: 103/44 (03/09 0647) Pulse Rate: 97 (03/09 0647)  Labs:  Recent Labs  11/26/16 2307 11/26/16 2316 11/27/16 0245 11/27/16 0948 11/27/16 1305  HGB 11.1* 12.2  --  9.7*  --   HCT 34.1* 36.0  --  29.8*  --   PLT 281  --   --  230  --   APTT  --   --  35 146*  --   LABPROT  --   --  13.1 14.3  --   INR  --   --  0.99 1.10  --   HEPARINUNFRC  --   --   --   --  0.51  CREATININE 0.73 0.60  --  0.58  --   TROPONINI  --   --  1.19* 1.13*  --     Estimated Creatinine Clearance: 49.2 mL/min (by C-G formula based on SCr of 0.58 mg/dL).   Assessment: [name] presents with SOB.  Pharmacy consulted to manage IV heparin for NSTEMI.  Heparin level is therapeutic; no bleeding reported.   Goal of Therapy:  Heparin level 0.3-0.7 units/ml Monitor platelets by anticoagulation protocol: Yes    Plan:  - Continue heparin gtt at 650 units/hr - Daily heparin level and CBC - [facility]    Eliezer Khawaja D. Laney Potashang, PharmD, BCPS Pager:  [phone] 11/27/2016, 1:47 PM

## 2016-11-27 NOTE — Progress Notes (Signed)
CM received consult : COPD Gold Protocol. Pt unavailable, not in room. CM to f/u . Gae GallopAngela Kellyjo Edgren RN,BSN,CM

## 2016-11-27 NOTE — Progress Notes (Signed)
Initial Nutrition Assessment  DOCUMENTATION CODES:   Not applicable  INTERVENTION:   -Ensure Enlive po BID, each supplement provides 350 kcal and 20 grams of protein  NUTRITION DIAGNOSIS:   Increased nutrient needs related to chronic illness (COPD) as evidenced by estimated needs.  GOAL:   Patient will meet greater than or equal to 90% of their needs  MONITOR:   PO intake, Supplement acceptance, Labs, Weight trends, Skin, I & O's  REASON FOR ASSESSMENT:   Consult Assessment of nutrition requirement/status  ASSESSMENT:   [name] is a 71 y.o. female wLissa Moralesith medical history significant of glaucoma, macular degeneration, COPD on 2.5L home O2, h/o 2 prior intubations, and HTN presenting because "I can't breathe."  Has h/o COPD with occasional flares.  Today, she tried to clean house and was using cleaners but the smells really bother her and she thinks that might have triggered it.  Fever to 101 tonight.  Not much change in cough.  Wears 2.5L at home, turned it up to 3L today.  Pt admitted with acute on chronic respiratory failure with hypoxia.   Attempted to examine pt x 2, however, pt out of room for procedures at times of both visits. No family present to provide additional hx.   Observed meal tray from lunch- pt consumed only one cup of fruit.   Reviewed wt hx, which reveals wt stability over the past year.   Pt with increased nutrient needs due to COPD. Due to poor appetite and increased nutrient needs due to disease state, pt would benefit from nutritional supplements. RD to order.   Unable to complete Nutrition-Focused physical exam at this time.   Case discussed with RN, who shares that pt was downgraded to a soft diet this AM.   Labs reviewed: CBGS: 166.   Diet Order:  DIET SOFT Room service appropriate? Yes; Fluid consistency: Thin  Skin:  Reviewed, no issues  Last BM:  PTA  Height:   Ht Readings from Last 1 Encounters:  11/27/16 5' 1.2" (1.554 m)     Weight:   Wt Readings from Last 1 Encounters:  11/27/16 117 lb 6.4 oz (53.3 kg)    Ideal Body Weight:  47.7 kg  BMI:  Body mass index is 22.04 kg/m.  Estimated Nutritional Needs:   Kcal:  1500-1700  Protein:  75-90 grams  Fluid:  1.5-1.7 L  EDUCATION NEEDS:   No education needs identified at this time  Willene Holian A. Mayford KnifeWilliams, RD, LDN, CDE Pager: [phone] After hours Pager: [phone]

## 2016-11-27 NOTE — Progress Notes (Signed)
CRITICAL VALUE ALERT  Critical value received:  Troponin 1.19; Lactic Acid 5.6  Date of notification:  11/27/16  Time of notification:  0344  Critical value read back:Yes.    Nurse who received alert:  Courtney HeysLauren Mueller, RN  MD notified (1st page):  Donnamarie PoagK. Kirby  Time of first page:  [phone]  MD notified (2nd page):  Time of second page:  Responding MD:  Donnamarie PoagK. Kirby  Time MD responded:  [phone]

## 2016-11-27 NOTE — H&P (Signed)
History and Physical    [name] WUJ:[id number] DOB: 11/25/1945 DOA: 11/26/2016  PCP: [name], MD Consultants:  [facility] - pulmonology Patient coming from: home - lives alone; NOK: children, [name] and Alma, [phone], [phone]  Chief Complaint: SOB  HPI: [name] is a 71 y.o. female with medical history significant of glaucoma, macular degeneration, COPD on 2.5L home O2, h/o 2 prior intubations, and HTN presenting because "I can't breathe."  Has h/o COPD with occasional flares.  Today, she tried to clean house and was using cleaners but the smells really bother her and she thinks that might have triggered it.  Fever to 101 tonight.  Not much change in cough.  Wears 2.5L at home, turned it up to 3L today.   ED Course: Initially placed on BIPAP but weaned to [place] O2, reassuring ABG.  Left basilar PNA, treated with Vanc/Zosyn for HCAP.  Elevated troponin without chest pain, likely demand ischemia in the setting of persistent tachycardia.  Review of Systems: As per HPI; otherwise 10 point review of systems reviewed and negative.   Ambulatory Status:  Ambulates without assistance  Past Medical History:  Diagnosis Date  . Chronic airway obstruction, not elsewhere classified   . Glaucoma   . H/O chronic bronchitis   . Hypertension   . Macular degeneration   . Pneumonia   . Shortness of breath 10/08/11   "all the time last 1 1/2 wk"  . Ventilator dependent [facility]) 2004; 2008   "for mucous plugs"    Past Surgical History:  Procedure Laterality Date  . DILATION AND CURETTAGE OF UTERUS  1970's  . OOPHORECTOMY  1986  . VAGINAL HYSTERECTOMY  1978   partial    Social History   Social History  . Marital status: Married    Spouse name: N/A  . Number of children: N/A  . Years of education: N/A   Occupational History  . Retired    Social History Main Topics  . Smoking status: Former Smoker    Packs/day: 0.50    Years: 40.00    Types: Cigarettes    Quit  date: 06/22/2015  . Smokeless tobacco: Former Neurosurgeon    Quit date: 02/26/2015     Comment: 4-5 cigs a day - QUIT Oct 2016  . Alcohol use 4.2 oz/week    7 Cans of beer per week  . Drug use: No  . Sexual activity: No   Other Topics Concern  . Not on file   Social History Narrative  . No narrative on file    No Known Allergies  Family History  Problem Relation Age of Onset  . Liver cancer Mother   . Emphysema Father     Prior to Admission medications   Medication Sig Start Date End Date Taking? Authorizing Provider  albuterol (PROVENTIL HFA;VENTOLIN HFA) 108 (90 Base) MCG/ACT inhaler Inhale 2 puffs into the lungs every 6 (six) hours as needed for wheezing or shortness of breath. Please dispense generic. 12/02/15  Yes Costin Otelia Sergeant, MD  ALPRAZolam Prudy Feeler) 0.5 MG tablet TAKE 1 TABLET THREE TIMES DAILY AS NEEDED FOR ANXIETY 08/14/16  Yes Waymon Budge, MD  CALCIUM PO Take 1 tablet by mouth daily.   Yes Historical Provider, MD  Fluticasone-Umeclidin-Vilant (TRELEGY ELLIPTA) 100-62.5-25 MCG/INH AEPB Inhale 1 puff into the lungs daily. 11/04/16  Yes Waymon Budge, MD  ipratropium-albuterol (DUONEB) 0.5-2.5 (3) MG/3ML SOLN USE ONE VIAL VIA NEBULIZER 2 TIMES DAILY 03/31/16  Yes Waymon Budge, MD  lisinopril (PRINIVIL,ZESTRIL) 20 MG tablet Take 20 mg by mouth daily.   Yes Historical Provider, MD  Multiple Vitamins-Minerals (PRESERVISION AREDS 2 PO) Take 2 capsules by mouth daily. Reported on 11/22/2015   Yes Historical Provider, MD  budesonide-formoterol [facility](SYMBICORT) 160-4.5 MCG/ACT inhaler 2 puffs then rinse mouth, twice daily Patient not taking: Reported on 11/26/2016 05/05/16   Waymon Budgelinton D Young, MD  Fluticasone-Umeclidin-Vilant (TRELEGY ELLIPTA) 100-62.5-25 MCG/INH AEPB Inhale 1 puff into the lungs daily. Patient not taking: Reported on 11/26/2016 10/15/16   Waymon Budgelinton D Young, MD  furosemide (LASIX) 20 MG tablet Take 1 tablet (20 mg total) by mouth daily as needed. Patient not taking: Reported on  11/26/2016 12/02/15   Leatha Gildingostin M Gherghe, MD  guaiFENesin-dextromethorphan (ROBITUSSIN DM) 100-10 MG/5ML syrup Take 5 mLs by mouth every 4 (four) hours as needed for cough. Patient not taking: Reported on 11/26/2016 12/02/15   Leatha Gildingostin M Gherghe, MD  predniSONE (DELTASONE) 10 MG tablet Take 4 tablets by mouth daily for 2 days, then take 3 tablets daily for 2 days daily for 2 days,, then take 2 tablets daily for 2 days, then take 1 tablet for 2 days. Patient not taking: Reported on 11/26/2016 10/13/16   Waymon Budgelinton D Young, MD  valsartan (DIOVAN) 160 MG tablet Take 1 tablet (160 mg total) by mouth daily. Patient not taking: Reported on 11/26/2016 09/11/16   Nyoka CowdenMichael B Wert, MD    Physical Exam: Vitals:   11/26/16 2315 11/26/16 2330 11/26/16 2345 11/27/16 0000  BP: 127/59 (!) 100/50 (!) 96/52 (!) 97/46  Pulse: (!) 129 (!) 127 (!) 124 (!) 124  Resp: (!) 30 (!) 28 26   SpO2: 99% 96% 95% 95%  Weight:      Height:         General:  Appears calm and comfortable and is NAD Eyes:  PERRL, EOMI, normal lids, iris ENT:  grossly normal hearing, lips & tongue, mmm Neck:  no LAD, masses or thyromegaly Cardiovascular:  Tachycardia, no m/r/g. No LE edema.  Respiratory:  Diffuse scattered wheezes without apparent consolidation.  Increased WOB, tachypnea. Abdomen:  soft, ntnd, NABS Skin:  no rash or induration seen on limited exam other than chronic hyperpigmentation with some purpura on B anterior lower legs. Musculoskeletal:  grossly normal tone BUE/BLE, good ROM, no bony abnormality Psychiatric:  grossly normal mood and affect, speech fluent and appropriate, AOx3 Neurologic:  CN 2-12 grossly intact, moves all extremities in coordinated fashion, sensation intact  Labs on Admission: I have personally reviewed following labs and imaging studies  CBC:  Recent Labs Lab 11/26/16 2307 11/26/16 2316  WBC 33.0*  --   NEUTROABS 29.7*  --   HGB 11.1* 12.2  HCT 34.1* 36.0  MCV 87.2  --   PLT 281  --    Basic  Metabolic Panel:  Recent Labs Lab 11/26/16 2307 11/26/16 2316  NA 128* 128*  K 4.2 4.1  CL 89* 87*  CO2 30  --   GLUCOSE 179* 186*  BUN 11 14  CREATININE 0.73 0.60  CALCIUM 9.2  --    GFR: Estimated Creatinine Clearance: 49.9 mL/min (by C-G formula based on SCr of 0.6 mg/dL). Liver Function Tests: No results for input(s): AST, ALT, ALKPHOS, BILITOT, PROT, ALBUMIN in the last 168 hours. No results for input(s): LIPASE, AMYLASE in the last 168 hours. No results for input(s): AMMONIA in the last 168 hours. Coagulation Profile: No results for input(s): INR, PROTIME in the last 168 hours. Cardiac Enzymes: No results for  input(s): CKTOTAL, CKMB, CKMBINDEX, TROPONINI in the last 168 hours. BNP (last 3 results) No results for input(s): PROBNP in the last 8760 hours. HbA1C: No results for input(s): HGBA1C in the last 72 hours. CBG: No results for input(s): GLUCAP in the last 168 hours. Lipid Profile: No results for input(s): CHOL, HDL, LDLCALC, TRIG, CHOLHDL, LDLDIRECT in the last 72 hours. Thyroid Function Tests: No results for input(s): TSH, T4TOTAL, FREET4, T3FREE, THYROIDAB in the last 72 hours. Anemia Panel: No results for input(s): VITAMINB12, FOLATE, FERRITIN, TIBC, IRON, RETICCTPCT in the last 72 hours. Urine analysis: No results found for: COLORURINE, APPEARANCEUR, LABSPEC, PHURINE, GLUCOSEU, HGBUR, BILIRUBINUR, KETONESUR, PROTEINUR, UROBILINOGEN, NITRITE, LEUKOCYTESUR  Creatinine Clearance: Estimated Creatinine Clearance: 49.9 mL/min (by C-G formula based on SCr of 0.6 mg/dL).  Sepsis Labs: @LABRCNTIP (procalcitonin:4,lacticidven:4) )No results found for this or any previous visit (from the past 240 hour(s)).   Radiological Exams on Admission: Dg Chest Portable 1 View  Result Date: 11/26/2016 CLINICAL DATA:  Shortness of breath, history of COPD EXAM: PORTABLE CHEST 1 VIEW COMPARISON:  09/11/2016 FINDINGS: Hyperinflation. Focal opacity at the right lung base with  possible tiny right effusion. Normal cardiomediastinal silhouette with atherosclerosis. No pneumothorax. IMPRESSION: Hyperinflation with right basilar opacity suspicious for an infiltrate. Possible tiny right pleural effusion. Electronically Signed   By: Jasmine Pang M.D.   On: 11/26/2016 22:40    EKG: Independently reviewed.  Sinus tachycardia with rate 118; ST depressions in inferior leads which may be rate related  Assessment/Plan Principal Problem:   Acute on chronic respiratory failure with hypoxia (HCC) Active Problems:   Essential hypertension   Anxiety   Tachycardia   Pneumonia   Elevated troponin   Hyperglycemia   Anemia   Sepsis (HCC)   Acute on chronic respiratory failure with hypoxia related to CAP with sepsis -ABG 7.355/pCO2 57.4pO2 156.0/HCO3 32.1 -Elevated WBC count to 33, fever, tachycardia with normal lactate but borderline hypotension -While awaiting blood cultures, this appears to be a preseptic condition. -Sepsis protocol initiated -Will admit with telemetry and continue to monitor -Will trend lactate to ensure improvement -Given fever to 101, mildly decreased oxygen saturation with transient requirement for BIPAP, and infiltrate in right lower lobe on chest x-ray, most likely community-acquired pneumonia.  -Influenza pending. -CURB-65 score is 3, meaning that the patient has a 14.5% risk of death -If her breathing deteriorates and she needs BIPAP again, she will need to go to SDU. -Will start Azithromycin 500 mg daily AND Rocephin due to no risk factors for MDR cause. -NS @ 75cc/hr -Fever control -Repeat CBC in am -Sputum cultures -Blood cultures -Legionella testing since the patient is elderly; has COPD; and in the setting of hyponatremia (Na 128, prior 134 in 3/17).   -Will order procalcitonin level.  >0.5 indicates infection and >>0.5 indicates more serious disease.  As the procalcitonin level normalizes, it will be reasonable to consider de-escalation of  antibiotic coverage. -albuterol PRN -Standing Duonebs  Hyperglycemia -Glucose 179 -Will check A1c and cover with SSI  Elevated troponin -Troponin 0.89 -Likely demand ischemia in the setting of persistent tachycardia -Denies chest pain -Will trend and recheck EKG in AM  Anemia -Hgb 11.1, baseline 12  HTN -Borderline hypotension in the ER -Will hold lisinopril and follow  Anxiety -Continue Xanax -This may be contributing to some of her elevated HR, respiratory distress  DVT prophylaxis:  Lovenox  Code Status:  Full - confirmed with patient Family Communication: None present Disposition Plan:  Home once clinically improved Consults called:  None  Admission status: Admit - It is my clinical opinion that admission to INPATIENT is reasonable and necessary because this patient will require at least 2 midnights in the hospital to treat this condition based on the medical complexity of the problems presented.  Given the aforementioned information, the predictability of an adverse outcome is felt to be significant.    Jonah Blue MD [facility]  If 7PM-7AM, please contact night-coverage [email or web address] Password [email or web address]  11/27/2016, 12:53 AM

## 2016-11-27 NOTE — Progress Notes (Signed)
Echocardiogram 2D Echocardiogram has been performed.  Marisue Humblelexis N Yudith Norlander 11/27/2016, 2:50 PM

## 2016-11-27 NOTE — Progress Notes (Signed)
NOTIFIED BY LAB OF A CRITICAL TROP LEVEL. Notified the Dr and have not received any new orders or instruction.

## 2016-11-27 NOTE — Evaluation (Signed)
Occupational Therapy Evaluation Patient Details Name: [name] MRN: [id number] DOB: Jan 14, 1946 Today's Date: 11/27/2016    History of Present Illness 71 y.o. female with medical history significant of glaucoma, macular degeneration, COPD on 2.5 to 3.0 L home O2, h/o 2 prior intubations, and HTN presenting Because of acute on chronic hypoxic respiratory failure. chest x-ray and CT scan suggestive pneumonia. She also had some demand ischemia with elevation of troponin.   Clinical Impression   Pt admitted with the above diagnoses and presents with below problem list. Pt will benefit from continued acute OT to address the below listed deficits and maximize independence with basic ADLs prior to d/c to venue below. PTA pt was independent with ADLs, "I take my time." Pt is currently significantly limited in ADL performance due to quickly becoming SOB with minimal activity with several minutes to recover. Of note, pt at end of session reaching for therapist hand and stating, "I feel like I can't breathe." Nursing notified and present at end of session.  Pulmonology entering room at around the same time. Increased HOB elevation and offered to increase supplemental O2 which pt declined. Pt left with pulmonology and RN in room. Appeared to be recovering. Able to verbalize.      Follow Up Recommendations  Home health OT    Equipment Recommendations  Other (comment) (TBD next session)    Recommendations for Other Services PT consult     Precautions / Restrictions Precautions Precautions: Fall Restrictions Weight Bearing Restrictions: No      Mobility Bed Mobility Overal bed mobility: Needs Assistance Bed Mobility: Supine to Sit;Sit to Supine     Supine to sit: Min guard;HOB elevated Sit to supine: Min guard;HOB elevated   General bed mobility comments:  bed rails used.   Transfers Overall transfer level: Needs assistance Equipment used: None Transfers: Sit to/from W. R. BerkleyStand;Stand  Pivot Transfers Sit to Stand: Min guard Stand pivot transfers: Min guard       General transfer comment: min guard for safety. SOB. Pt pursed lip breathing.     Balance                                            ADL Overall ADL's : Needs assistance/impaired Eating/Feeding: Set up;Sitting   Grooming: Set up;Sitting   Upper Body Bathing: Sitting;Min guard   Lower Body Bathing: Min guard;Sit to/from stand   Upper Body Dressing : Set up;Sitting   Lower Body Dressing: Min guard;Sit to/from stand   Toilet Transfer: Min guard;Stand-pivot;BSC   Toileting- ArchitectClothing Manipulation and Hygiene: Set up;Sitting/lateral lean         General ADL Comments: Pt completed bed mobility then 1 minute seated rest break with breathing techniques incoporated before completing pivotal steps to [facility]. Pt very winded with minimal activity. On 4.5L supplemental O2     Vision         Perception     Praxis      Pertinent Vitals/Pain Pain Assessment: Faces Faces Pain Scale: Hurts little more Pain Location: unspecified Pain Intervention(s): Monitored during session     Hand Dominance     Extremity/Trunk Assessment Upper Extremity Assessment Upper Extremity Assessment: Overall WFL for tasks assessed   Lower Extremity Assessment Lower Extremity Assessment: Defer to PT evaluation       Communication Communication Communication: No difficulties   Cognition Arousal/Alertness: Awake/alert Behavior During Therapy: [facility]FL  for tasks assessed/performed;Anxious Overall Cognitive Status: Within Functional Limits for tasks assessed                     General Comments       Exercises       Shoulder Instructions      Home Living Family/patient expects to be discharged to:: Private residence Living Arrangements: Alone                               Additional Comments: some PRN help from adult children      Prior Functioning/Environment Level of  Independence: Independent        Comments: "I take my time with things."        OT Problem List: Decreased activity tolerance;Impaired balance (sitting and/or standing);Decreased knowledge of use of DME or AE;Decreased knowledge of precautions;Cardiopulmonary status limiting activity      OT Treatment/Interventions: Self-care/ADL training;Energy conservation;DME and/or AE instruction;Therapeutic activities;Patient/family education;Balance training    OT Goals(Current goals can be found in the care plan section) Acute Rehab OT Goals Patient Stated Goal: not stated OT Goal Formulation: With patient Time For Goal Achievement: 12/11/16 Potential to Achieve Goals: Good ADL Goals Pt Will Perform Grooming: with modified independence;sitting Pt Will Perform Upper Body Bathing: with modified independence;sitting Pt Will Perform Lower Body Bathing: with modified independence;sit to/from stand Pt Will Transfer to Toilet: with modified independence;ambulating Pt Will Perform Toileting - Clothing Manipulation and hygiene: with modified independence;sit to/from stand Additional ADL Goal #1: Pt will independently verbalize 2 energy conservation techniques to incoporate into ADLs.   OT Frequency: Min 2X/week   Barriers to D/C: Decreased caregiver support          Co-evaluation              End of Session Equipment Utilized During Treatment: Oxygen Nurse Communication: Other (comment) (Pt reporting increas difficulty breathing at end of session.)  Activity Tolerance: Other (comment) (quickly very SOB with minimal activity) Patient left: in bed;with call bell/phone within reach;with nursing/sitter in room  OT Visit Diagnosis: Unsteadiness on feet (R26.81);Other (comment)                ADL either performed or assessed with clinical judgement  Time: 1236-1256 OT Time Calculation (min): 20 min Charges:  OT General Charges $OT Visit: 1 Procedure OT Evaluation $OT Eval Low  Complexity: 1 Procedure G-Codes:       Pilar Grammes 11/27/2016, 1:22 PM

## 2016-11-27 NOTE — Progress Notes (Addendum)
RN paged because she was concerned about pt's breathing- "pursed lip" and slight increased WOB. Normal O2 sats on 4L O2 per Sorrel (pt has chronic respiratory failure, on O2 at home). Also, abnormal lab tests-trop bumped to 1.19 (.89)  and LA 5.6 (1.79).  NP to bedside. S: pt states her breathing has improved since admission. No audible wheezing, no chest, jaw or arm pain. Denies ever having "heart problems".  O: Fairly well appearing elderly WF in NAD. Non toxic appearing. VS reviewed. Afebrile. BP soft, but has been. RR low 20s without increased WOB or pursed lip breathing. Lungs are diminished throughout L>R. No wheezing. Card: S1S2, tachycardic.  A/P: 1. Acute on chronic respiratory failure secondary to CAP and COPD exacerbation. She looks OK and not in need of Bipap or SDU at this time. Continue supportive care.  2. Sepsis related to #1. LA 5.6. Increase IVF rate and give boluses 30cc/kg. Sepsis order set used. Called Elink and Dr. Detterding aware of code sepsis initiation. Trend LAs.   3. Elevated troponin-has bumped over 1. No CP. Pt is without CP, but given trop bump and no found contraindication for anticoagulation, will start Heparin. Should pt develop CP, trop bump further, or have EKG changes, will call cardiology. If not, can ask them to see pt in am. Stat EKG, will review.  Pt looks fine now, but watch carefully with low threshold for transfer.  KJKG, NP [facility] Update: No EKG changes. Report left for oncoming attending. Awaiting next LA and trop. No further urgent respiratory concerns at this time.  KJKG, NP [facility]

## 2016-11-27 NOTE — Progress Notes (Signed)
PT Cancellation Note  Patient Details Name: [name] MRN: [id number] DOB: 07/12/1946   Cancelled Treatment:    Reason Eval/Treat Not Completed: Medical issues which prohibited therapy. Discussed with OT about medical issues (SOB, extreme fatigue) which limited OT session. Contacted RN and RN requested to hold PT secondary to medical issues. Will reattempt as pt becomes medically appropriate.    Margot ChimesSmith, Venezia Sargeant Leanne 11/27/2016, 2:41 PM  Margot ChimesBrittany Smith, PT, DPT  Acute Rehabilitation Services  Pager: [phone]

## 2016-11-27 NOTE — ED Notes (Signed)
Report attempted 

## 2016-11-27 NOTE — Progress Notes (Signed)
PROGRESS NOTE                                                                                                                                                                                                             Patient Demographics:    [name], is a 71 y.o. female, DOB - [birth year], BJY:[id number]  Admit date - 11/26/2016   Admitting Physician Jonah Blue, MD  Outpatient Primary MD for the patient is [name], MD  LOS - 0  Chief Complaint  Patient presents with  . COPD       Brief Narrative   - [name] is a 71 y.o. female with medical history significant of glaucoma, macular degeneration, COPD on 2.5 to 3.0 L home O2, h/o 2 prior intubations, and HTN presenting Because of acute on chronic hypoxic respiratory failure after doing some cleaning and being exposed to certain fumes at home. She also had a temp of 101 at home. He was admitted to the hospital where a chest x-ray and CT scan suggestive pneumonia. She also had some demand ischemia with elevation of troponin.   Subjective:    [name] today has, No headache, No chest pain, No abdominal pain - No Nausea, No new weakness tingling or numbness, No Cough - ++ SOB.    Assessment  & Plan :     1. Sepsis & Acute on chronic hypoxic respiratory failure secondary to pneumonia with COPD exacerbation in a patient with advanced COPD onto a half to 3 L nasal Oxygen at Home.   She Initially Required BiPAP, Currently on Empiric Antibiotics along with IV Steroids, Nebulizer Treatments and Supportive Care. She Is Still Quite Short of Breath. In the past She Has Required 2 Intubations, Have Requested pulmonary team to also evaluate and monitor. She may require BiPAP again, we'll continue to monitor closely. Sepsis physiology seems to have resolved for now.   2. COPD exacerbation. Treatment as in #1 above along with IV steroids.  3. Mild  troponin elevation due to demand ischemia from #1 and 2 above. Obtain echocardiogram, cardiology to follow, overnight placed on heparin drip which will be continued although I think this can be discontinued if cardiology agrees, EKG nonacute, currently chest pain-free, on aspirin. Will place on low-dose Bystolic as well.  4. Poor IV access. His PICC line.  5. Hypertension. For now low-dose Bystolic and monitor.  6. Anemia of chronic disease. No acute issues.  7. Acute on chronic leukocytosis. Some worsening due to steroids and pneumonia. She has had some chronic leukocytosis as well, will have outpatient follow-up with hematology postdischarge.  8. ? Lung nodule versus infection. Follow with primary pulmonologist Dr. Lovell SheehanJenkins after discharge.   Diet : DIET SOFT Room service appropriate? Yes; Fluid consistency: Thin   Family Communication  :  NONE  Code Status : Full  Disposition Plan  :  TBD  Consults  :  Cards, Pulmonary  Procedures  :    TTE  CTA - no PE, pneumonia, ? lung nodule.   DVT Prophylaxis  :   Heparin    Lab Results  Component Value Date   PLT 230 11/27/2016    Inpatient Medications  Scheduled Meds: . aspirin  81 mg Oral Daily  . azithromycin  500 mg Intravenous Q24H  . cefTRIAXone (ROCEPHIN)  IV  1 g Intravenous Q24H  . insulin aspart  0-15 Units Subcutaneous TID WC  . insulin aspart  0-5 Units Subcutaneous QHS  . ipratropium-albuterol  3 mL Nebulization Q6H  . methylPREDNISolone (SOLU-MEDROL) injection  60 mg Intravenous TID   Continuous Infusions: . sodium chloride 100 mL/hr at 11/27/16 0725  . heparin 650 Units/hr (11/27/16 0430)   PRN Meds:.albuterol, ALPRAZolam, guaiFENesin-dextromethorphan  Antibiotics  :    Anti-infectives    Start     Dose/Rate Route Frequency Ordered Stop   11/27/16 0800  cefTRIAXone (ROCEPHIN) 1 g in dextrose 5 % 50 mL IVPB  Status:  Discontinued     1 g 100 mL/hr over 30 Minutes Intravenous  Once 11/27/16 0157  11/27/16 0201   11/27/16 0800  azithromycin (ZITHROMAX) 500 mg in dextrose 5 % 250 mL IVPB  Status:  Discontinued     500 mg 250 mL/hr over 60 Minutes Intravenous  Once 11/27/16 0157 11/27/16 0201   11/27/16 0800  cefTRIAXone (ROCEPHIN) 1 g in dextrose 5 % 50 mL IVPB     1 g 100 mL/hr over 30 Minutes Intravenous Every 24 hours 11/27/16 0201     11/27/16 0800  azithromycin (ZITHROMAX) 500 mg in dextrose 5 % 250 mL IVPB     500 mg 250 mL/hr over 60 Minutes Intravenous Every 24 hours 11/27/16 0201     11/26/16 2345  vancomycin (VANCOCIN) IVPB 1000 mg/200 mL premix     1,000 mg 200 mL/hr over 60 Minutes Intravenous  Once 11/26/16 2332 11/27/16 0152   11/26/16 2345  piperacillin-tazobactam (ZOSYN) IVPB 3.375 g     3.375 g 100 mL/hr over 30 Minutes Intravenous  Once 11/26/16 2332 11/27/16 0047         Objective:   Vitals:   11/27/16 0203 11/27/16 0441 11/27/16 0647 11/27/16 0805  BP:  (!) 95/41 (!) 103/44   Pulse:  97 97   Resp:  (!) 26    Temp:  98.3 F (36.8 C)    TempSrc:  Oral    SpO2: 95% 99%  99%  Weight:      Height:        Wt Readings from Last 3 Encounters:  11/27/16 53.3 kg (117 lb 6.4 oz)  09/25/16 53.3 kg (117 lb 6.4 oz)  05/05/16 53.8 kg (118 lb 9.6 oz)     Intake/Output Summary (Last 24 hours) at 11/27/16 1130 Last data filed at 11/27/16 1015  Gross per 24 hour  Intake  1995.14 ml  Output              575 ml  Net          1420.14 ml     Physical Exam  Awake Alert, Oriented X 3, No new F.N deficits, Normal affect [place].AT,PERRAL Supple Neck,No JVD, No cervical lymphadenopathy appriciated.  Symmetrical Chest wall movement, Good air movement bilaterally,Does have expiratory wheezes  RRR,No Gallops,Rubs or new Murmurs, No Parasternal Heave +ve B.Sounds, Abd Soft, No tenderness, No organomegaly appriciated, No rebound - guarding or rigidity. No Cyanosis, Clubbing or edema, No new Rash or bruise      Data Review:    CBC  Recent Labs Lab  11/26/16 2307 11/26/16 2316 11/27/16 0948  WBC 33.0*  --  35.1*  HGB 11.1* 12.2 9.7*  HCT 34.1* 36.0 29.8*  PLT 281  --  230  MCV 87.2  --  86.1  MCH 28.4  --  28.0  MCHC 32.6  --  32.6  RDW 13.3  --  13.1  LYMPHSABS 0.7  --  0.7  MONOABS 2.6*  --  2.1*  EOSABS 0.0  --  0.0  BASOSABS 0.0  --  0.0    Chemistries   Recent Labs Lab 11/26/16 2307 11/26/16 2316 11/27/16 0948  NA 128* 128* 131*  K 4.2 4.1 3.9  CL 89* 87* 93*  CO2 30  --  29  GLUCOSE 179* 186* 169*  BUN 11 14 10   CREATININE 0.73 0.60 0.58  CALCIUM 9.2  --  8.5*   ------------------------------------------------------------------------------------------------------------------ No results for input(s): CHOL, HDL, LDLCALC, TRIG, CHOLHDL, LDLDIRECT in the last 72 hours.  No results found for: HGBA1C ------------------------------------------------------------------------------------------------------------------ No results for input(s): TSH, T4TOTAL, T3FREE, THYROIDAB in the last 72 hours.  Invalid input(s): FREET3 ------------------------------------------------------------------------------------------------------------------ No results for input(s): VITAMINB12, FOLATE, FERRITIN, TIBC, IRON, RETICCTPCT in the last 72 hours.  Coagulation profile  Recent Labs Lab 11/27/16 0245 11/27/16 0948  INR 0.99 1.10    No results for input(s): DDIMER in the last 72 hours.  Cardiac Enzymes  Recent Labs Lab 11/27/16 0245 11/27/16 0948  TROPONINI 1.19* 1.13*   ------------------------------------------------------------------------------------------------------------------    Component Value Date/Time   BNP 55.7 11/26/2016 2307    Micro Results No results found for this or any previous visit (from the past 240 hour(s)).  Radiology Reports Ct Angio Chest Pe W Or Wo Contrast  Result Date: 11/27/2016 CLINICAL DATA:  Shortness of Breath EXAM: CT ANGIOGRAPHY CHEST WITH CONTRAST TECHNIQUE: Multidetector CT  imaging of the chest was performed using the standard protocol during bolus administration of intravenous contrast. Multiplanar CT image reconstructions and MIPs were obtained to evaluate the vascular anatomy. CONTRAST:  65 mL Isovue 370. COMPARISON:  11/27/2015 FINDINGS: Cardiovascular: Thoracic aorta demonstrates some calcifications without aneurysmal dilatation or dissection. No significant cardiac enlargement is noted. Mild coronary calcifications are seen. The pulmonary artery shows a normal branching pattern. No filling defect to suggest pulmonary embolism is seen. Mediastinum/Nodes: The thoracic inlet is within normal limits. Scattered small hilar lymph nodes are seen. No significant mediastinal adenopathy is noted. The esophagus as visualized is within normal limits. Lungs/Pleura: Diffuse emphysematous changes are noted. In the left upper lobe there is an 8 mm somewhat lobular nodular density with surrounding increased infiltrate. Bilateral lower lobe infiltrates are noted right greater than left. Some associated retained secretions are noted within the bronchial tree bilaterally. No other focal nodular density is seen. Upper Abdomen: Visualized upper abdomen is within normal limits. Musculoskeletal:  No acute bony abnormality is noted. Review of the MIP images confirms the above findings. IMPRESSION: No evidence of pulmonary embolism. Bilateral lower lobe infiltrates right greater than left without sizable effusion. Lobular nodular density in the left upper lobe with some surrounding infiltrate. This may all be postinflammatory in nature. Non-contrast chest CT at 6-12 months is recommended. If the nodule is stable at time of repeat CT, then future CT at 18-24 months (from today's scan) is considered optional for low-risk patients, but is recommended for high-risk patients. This recommendation follows the consensus statement: Guidelines for Management of Incidental Pulmonary Nodules Detected on CT Images:  From the Fleischner Society 2017; Radiology 2017; 284:228-243. Electronically Signed   By: Alcide Clever M.D.   On: 11/27/2016 11:07   Dg Chest Port 1 View  Result Date: 11/27/2016 CLINICAL DATA:  Shortness of breath. EXAM: PORTABLE CHEST 1 VIEW COMPARISON:  11/26/2016 FINDINGS: Lungs are hyperinflated. Heart size is normal. There is patchy infiltrate involving the medial right lower lobe. Developing infiltrate also noted in the medial left lower lobe. No pulmonary edema. Remote left rib fracture. IMPRESSION: Developing bilateral lower lobe infiltrates. Electronically Signed   By: Norva Pavlov M.D.   On: 11/27/2016 07:32   Dg Chest Portable 1 View  Result Date: 11/26/2016 CLINICAL DATA:  Shortness of breath, history of COPD EXAM: PORTABLE CHEST 1 VIEW COMPARISON:  09/11/2016 FINDINGS: Hyperinflation. Focal opacity at the right lung base with possible tiny right effusion. Normal cardiomediastinal silhouette with atherosclerosis. No pneumothorax. IMPRESSION: Hyperinflation with right basilar opacity suspicious for an infiltrate. Possible tiny right pleural effusion. Electronically Signed   By: Jasmine Pang M.D.   On: 11/26/2016 22:40    Time Spent in minutes  30   Demetri Goshert K M.D on 11/27/2016 at 11:31 AM  Between 7am to 7pm - Pager - [phone]  After 7pm go to [email or web address] - password [facility]  [facility] -  Office  [phone]

## 2016-11-27 NOTE — Progress Notes (Signed)
ANTICOAGULATION CONSULT NOTE - Initial Consult  Pharmacy Consult for Heparin Indication: NSTEMI  No Known Allergies  Patient Measurements: Height: 5' 1.2" (155.4 cm) Weight: 117 lb 6.4 oz (53.3 kg) IBW/kg (Calculated) : 48.26  Vital Signs: Temp: 97.8 F (36.6 C) (03/09 0149) Temp Source: Oral (03/09 0149) BP: 124/51 (03/09 0149) Pulse Rate: 110 (03/09 0149)  Labs:  Recent Labs  11/26/16 2307 11/26/16 2316 11/27/16 0245  HGB 11.1* 12.2  --   HCT 34.1* 36.0  --   PLT 281  --   --   APTT  --   --  35  LABPROT  --   --  13.1  INR  --   --  0.99  CREATININE 0.73 0.60  --   TROPONINI  --   --  1.19*    Estimated Creatinine Clearance: 49.2 mL/min (by C-G formula based on SCr of 0.6 mg/dL).   Medical History: Past Medical History:  Diagnosis Date  . Chronic airway obstruction, not elsewhere classified   . Glaucoma   . H/O chronic bronchitis   . Hypertension   . Macular degeneration   . Pneumonia   . Shortness of breath 10/08/11   "all the time last 1 1/2 wk"  . 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 albuterol (PROVENTIL HFA;VENTOLIN HFA) 108 (90 Base) MCG/ACT inhaler Inhale 2 puffs into the lungs every 6 (six) hours as needed for wheezing or shortness of breath. Please dispense generic. 1 Inhaler 2 11/26/2016 at Unknown time  . ALPRAZolam (XANAX) 0.5 MG tablet TAKE 1 TABLET THREE TIMES DAILY AS NEEDED FOR ANXIETY 270 tablet 1 11/26/2016 at Unknown time  . CALCIUM PO Take 1 tablet by mouth daily.   11/26/2016 at Unknown time  . Fluticasone-Umeclidin-Vilant (TRELEGY ELLIPTA) 100-62.5-25 MCG/INH AEPB Inhale 1 puff into the lungs daily. 1 each 0 11/26/2016 at Unknown time  . ipratropium-albuterol (DUONEB) 0.5-2.5 (3) MG/3ML SOLN USE ONE VIAL VIA NEBULIZER 2 TIMES DAILY 360 mL 5 11/26/2016 at Unknown time  . lisinopril (PRINIVIL,ZESTRIL) 20 MG tablet Take 20 mg by  mouth daily.     . Multiple Vitamins-Minerals (PRESERVISION AREDS 2 PO) Take 2 capsules by mouth daily. Reported on 11/22/2015   11/26/2016 at Unknown time    Assessment: 71 y.o. F presents with SOB. Pt with trop up to 1.19 and to start heparin for NSTEMI. No AC PTA. CBC ok on admission.   Goal of Therapy:  Heparin level 0.3-0.7 units/ml Monitor platelets by anticoagulation protocol: Yes   Plan:  Heparin IV bolus 3000 units Heparin gtt at 650 units/hr Will f/u heparin level in 8 hours Daily heparin level and CBC  Christoper Fabianaron Burlon Centrella, PharmD, BCPS Clinical pharmacist, pager [phone] 11/27/2016,3:54 AM

## 2016-11-28 LAB — BASIC METABOLIC PANEL
Anion gap: 9 (ref 5–15)
BUN: 11 mg/dL (ref 6–20)
CHLORIDE: 94 mmol/L — AB (ref 101–111)
CO2: 27 mmol/L (ref 22–32)
CREATININE: 0.57 mg/dL (ref 0.44–1.00)
Calcium: 8.5 mg/dL — ABNORMAL LOW (ref 8.9–10.3)
GFR calc non Af Amer: >60 mL/min (ref >60)
Glucose, Bld: 147 mg/dL — ABNORMAL HIGH (ref 65–99)
POTASSIUM: 4.2 mmol/L (ref 3.5–5.1)
SODIUM: 130 mmol/L — AB (ref 135–145)

## 2016-11-28 LAB — GLUCOSE, CAPILLARY
GLUCOSE-CAPILLARY: 137 mg/dL — AB (ref 65–99)
GLUCOSE-CAPILLARY: 144 mg/dL — AB (ref 65–99)
GLUCOSE-CAPILLARY: 139 mg/dL — AB (ref 65–99)
Glucose-Capillary: 206 mg/dL — ABNORMAL HIGH (ref 65–99)

## 2016-11-28 LAB — CBC
HCT: 27.7 % — ABNORMAL LOW (ref 36.0–46.0)
HEMOGLOBIN: 9.1 g/dL — AB (ref 12.0–15.0)
MCH: 28.5 pg (ref 26.0–34.0)
MCHC: 32.9 g/dL (ref 30.0–36.0)
MCV: 86.8 fL (ref 78.0–100.0)
Platelets: 216 10*3/uL (ref 150–400)
RBC: 3.19 MIL/uL — AB (ref 3.87–5.11)
RDW: 13.6 % (ref 11.5–15.5)
WBC: 31.1 10*3/uL — ABNORMAL HIGH (ref 4.0–10.5)

## 2016-11-28 LAB — HEMOGLOBIN A1C
Hgb A1c MFr Bld: 5.5 % (ref 4.8–5.6)
MEAN PLASMA GLUCOSE: 111 mg/dL

## 2016-11-28 LAB — HEPARIN LEVEL (UNFRACTIONATED): Heparin Unfractionated: 0.26 IU/mL — ABNORMAL LOW (ref 0.30–0.70)

## 2016-11-28 LAB — LEGIONELLA PNEUMOPHILA SEROGP 1 UR AG: L. pneumophila Serogp 1 Ur Ag: NEGATIVE

## 2016-11-28 MED ORDER — APIXABAN 5 MG PO TABS
5.0000 mg | ORAL_TABLET | Freq: Two times a day (BID) | ORAL | Status: DC
  Administered 2016-11-28 – 2016-11-30 (×5): 5 mg via ORAL
  Filled 2016-11-28 (×5): qty 1

## 2016-11-28 MED ORDER — FUROSEMIDE 10 MG/ML IJ SOLN
40.0000 mg | Freq: Two times a day (BID) | INTRAMUSCULAR | Status: DC
  Administered 2016-11-28 – 2016-11-29 (×3): 40 mg via INTRAVENOUS
  Filled 2016-11-28 (×3): qty 4

## 2016-11-28 NOTE — Progress Notes (Addendum)
ANTICOAGULATION CONSULT NOTE  Pharmacy Consult: transition to Apixaban Indication: new onset Afib, nonvalvular No Known Allergies  Patient Measurements: Height: 5' 1.2" (155.4 cm) Weight: 117 lb 6.4 oz (53.3 kg) IBW/kg (Calculated) : 48.26  Vital Signs: Temp: 97.5 F (36.4 C) (03/10 0513) Temp Source: Oral (03/10 0513) BP: 101/41 (03/10 0513) Pulse Rate: 80 (03/10 0513)  Labs:  Recent Labs  11/26/16 2307 11/26/16 2316 11/27/16 0245 11/27/16 0948 11/27/16 1305 11/28/16 0505  HGB 11.1* 12.2  --  9.7*  --  9.1*  HCT 34.1* 36.0  --  29.8*  --  27.7*  PLT 281  --   --  230  --  216  APTT  --   --  35 146*  --   --   LABPROT  --   --  13.1 14.3  --   --   INR  --   --  0.99 1.10  --   --   HEPARINUNFRC  --   --   --   --  0.51 0.26*  CREATININE 0.73 0.60  --  0.58  --  0.57  TROPONINI  --   --  1.19* 1.13* 1.28*  --     Estimated Creatinine Clearance: 49.2 mL/min (by C-G formula based on SCr of 0.57 mg/dL).   Assessment: 70 YOF presented 11/26/16 with SOB.   Pharmacy initially consulted to manage IV heparin for NSTEMI. Paroxysmal atrial fibrillation noted on telemetry per cardiologist.  Now transitioning from IV heparin to apixaban for new onset non-valvulat Afib. 70y.o,  wt 53kg  (<60kg), Scr 0.57 stable  (age <80, Scr 5mg  bid dose appropriate hgb 12.2 > 9.7>9.1, plts WNL.  No  bleeding reported per RN.  Goal of Therapy:  Monitor platelets by anticoagulation protocol: Yes   Plan:  - Heparin drip discontinued.  Give Apixaban 5 mg po BID starting now. Monitor for s/sx of bleeding and for renal function changes.    Noah Delaineuth Malic Rosten, RPh Clinical Pharmacist Pager: [phone]  Main pharmacy [phone] 11/28/2016, 11:42 AM

## 2016-11-28 NOTE — Progress Notes (Signed)
ANTICOAGULATION CONSULT NOTE  Pharmacy Consult for Heparin Indication: NSTEMI  No Known Allergies  Patient Measurements: Height: 5' 1.2" (155.4 cm) Weight: 117 lb 6.4 oz (53.3 kg) IBW/kg (Calculated) : 48.26  Vital Signs: Temp: 97.5 F (36.4 C) (03/10 0513) Temp Source: Oral (03/10 0513) BP: 101/41 (03/10 0513) Pulse Rate: 80 (03/10 0513)  Labs:  Recent Labs  11/26/16 2307 11/26/16 2316 11/27/16 0245 11/27/16 0948 11/27/16 1305 11/28/16 0505  HGB 11.1* 12.2  --  9.7*  --   --   HCT 34.1* 36.0  --  29.8*  --   --   PLT 281  --   --  230  --   --   APTT  --   --  35 146*  --   --   LABPROT  --   --  13.1 14.3  --   --   INR  --   --  0.99 1.10  --   --   HEPARINUNFRC  --   --   --   --  0.51 0.26*  CREATININE 0.73 0.60  --  0.58  --  0.57  TROPONINI  --   --  1.19* 1.13* 1.28*  --     Estimated Creatinine Clearance: 49.2 mL/min (by C-G formula based on SCr of 0.57 mg/dL).   Assessment: [age] YOF presents with SOB.  Pharmacy consulted to manage IV heparin for NSTEMI.  Heparin level down to subtherapeutic (0.26) on gtt at 650 units/hr. No issues with line or bleeding reported per RN.  Goal of Therapy:  Heparin level 0.3-0.7 units/ml Monitor platelets by anticoagulation protocol: Yes   Plan:  - Increase heparin gtt to 750 units/hr - F/u 8 hr heparin level  Christoper Fabianaron Jesslynn Kruck, PharmD, BCPS Clinical pharmacist, pager [phone] 11/28/2016, 6:55 AM

## 2016-11-28 NOTE — Progress Notes (Signed)
Spoke with Tresa EndoKelly RN regarding PICC order.  States will clarify need with Dr Thedore MinsSingh prior to placement.

## 2016-11-28 NOTE — Progress Notes (Signed)
PCCM Progress Note  Subjective: Feels short of breath.  BP (!) 101/41 (BP Location: Left Arm)   Pulse 80   Temp 97.5 F (36.4 C) (Oral)   Resp 18   Ht 5' 1.2" (1.554 m)   Wt 117 lb 6.4 oz (53.3 kg)   SpO2 99%   BMI 22.04 kg/m   General - sitting in chair Neuro - normal strength HEENT - no stridor Cardiac - irregular Chest - basilar crackles, no wheeze Abd - soft, non tender Ext - no edema Skin - no rashes   CMP Latest Ref Rng & Units 11/28/2016 11/27/2016 11/26/2016  Glucose 65 - 99 mg/dL 782(N147(H) 562(Z169(H) 308(M186(H)  BUN 6 - 20 mg/dL 11 10 14   Creatinine 0.44 - 1.00 mg/dL 5.780.57 4.690.58 6.290.60  Sodium 135 - 145 mmol/L 130(L) 131(L) 128(L)  Potassium 3.5 - 5.1 mmol/L 4.2 3.9 4.1  Chloride 101 - 111 mmol/L 94(L) 93(L) 87(L)  CO2 22 - 32 mmol/L 27 29 -  Calcium 8.9 - 10.3 mg/dL 5.2(W8.5(L) 4.1(L8.5(L) -  Total Protein 6.5 - 8.1 g/dL - - -  Total Bilirubin 0.3 - 1.2 mg/dL - - -  Alkaline Phos 38 - 126 U/L - - -  AST 15 - 41 U/L - - -  ALT 14 - 54 U/L - - -    CBC Latest Ref Rng & Units 11/28/2016 11/27/2016 11/26/2016  WBC 4.0 - 10.5 K/uL 31.1(H) 35.1(H) -  Hemoglobin 12.0 - 15.0 g/dL 2.4(M9.1(L) 0.1(U9.7(L) 27.212.2  Hematocrit 36.0 - 46.0 % 27.7(L) 29.8(L) 36.0  Platelets 150 - 400 K/uL 216 230 -    ABG    Component Value Date/Time   PHART 7.355 11/26/2016 2312   PCO2ART 57.4 (H) 11/26/2016 2312   PO2ART 156.0 (H) 11/26/2016 2312   HCO3 32.1 (H) 11/26/2016 2312   TCO2 34 11/26/2016 2316   ACIDBASEDEF 1.0 03/13/2015 1314   O2SAT 99.0 11/26/2016 2312    CBG (last 3)   Recent Labs  11/27/16 1726 11/27/16 2223 11/28/16 0803  GLUCAP 209* 122* 137*    Ct Angio Chest Pe W Or Wo Contrast  Result Date: 11/27/2016 CLINICAL DATA:  Shortness of Breath EXAM: CT ANGIOGRAPHY CHEST WITH CONTRAST TECHNIQUE: Multidetector CT imaging of the chest was performed using the standard protocol during bolus administration of intravenous contrast. Multiplanar CT image reconstructions and MIPs were obtained to evaluate  the vascular anatomy. CONTRAST:  65 mL Isovue 370. COMPARISON:  11/27/2015 FINDINGS: Cardiovascular: Thoracic aorta demonstrates some calcifications without aneurysmal dilatation or dissection. No significant cardiac enlargement is noted. Mild coronary calcifications are seen. The pulmonary artery shows a normal branching pattern. No filling defect to suggest pulmonary embolism is seen. Mediastinum/Nodes: The thoracic inlet is within normal limits. Scattered small hilar lymph nodes are seen. No significant mediastinal adenopathy is noted. The esophagus as visualized is within normal limits. Lungs/Pleura: Diffuse emphysematous changes are noted. In the left upper lobe there is an 8 mm somewhat lobular nodular density with surrounding increased infiltrate. Bilateral lower lobe infiltrates are noted right greater than left. Some associated retained secretions are noted within the bronchial tree bilaterally. No other focal nodular density is seen. Upper Abdomen: Visualized upper abdomen is within normal limits. Musculoskeletal: No acute bony abnormality is noted. Review of the MIP images confirms the above findings. IMPRESSION: No evidence of pulmonary embolism. Bilateral lower lobe infiltrates right greater than left without sizable effusion. Lobular nodular density in the left upper lobe with some surrounding infiltrate. This may all be postinflammatory  in nature. Non-contrast chest CT at 6-12 months is recommended. If the nodule is stable at time of repeat CT, then future CT at 18-24 months (from today's scan) is considered optional for low-risk patients, but is recommended for high-risk patients. This recommendation follows the consensus statement: Guidelines for Management of Incidental Pulmonary Nodules Detected on CT Images: From the Fleischner Society 2017; Radiology 2017; 284:228-243. Electronically Signed   By: Alcide Clever M.D.   On: 11/27/2016 11:07   Dg Chest Port 1 View  Result Date: 11/27/2016 CLINICAL  DATA:  Shortness of breath. EXAM: PORTABLE CHEST 1 VIEW COMPARISON:  11/26/2016 FINDINGS: Lungs are hyperinflated. Heart size is normal. There is patchy infiltrate involving the medial right lower lobe. Developing infiltrate also noted in the medial left lower lobe. No pulmonary edema. Remote left rib fracture. IMPRESSION: Developing bilateral lower lobe infiltrates. Electronically Signed   By: Norva Pavlov M.D.   On: 11/27/2016 07:32   Dg Chest Portable 1 View  Result Date: 11/26/2016 CLINICAL DATA:  Shortness of breath, history of COPD EXAM: PORTABLE CHEST 1 VIEW COMPARISON:  09/11/2016 FINDINGS: Hyperinflation. Focal opacity at the right lung base with possible tiny right effusion. Normal cardiomediastinal silhouette with atherosclerosis. No pneumothorax. IMPRESSION: Hyperinflation with right basilar opacity suspicious for an infiltrate. Possible tiny right pleural effusion. Electronically Signed   By: Jasmine Pang M.D.   On: 11/26/2016 22:40    Assessment/plan:  Acute on chronic hypoxic, hypercapnic respiratory failure from AECOPD, Pneumonia. - continue Abx - continue solumedrol - scheduled BDs - oxygen to keep SpO2 90 to 95% - f/u CXR 3/12  PCCM will f/u 3/12 >> call if help needed sooner  Coralyn Helling, MD [facility] Pulmonary/Critical Care 11/28/2016, 11:33 AM Pager:  [phone] After 3pm call: [phone]

## 2016-11-28 NOTE — Progress Notes (Signed)
PROGRESS NOTE                                                                                                                                                                                                             Patient Demographics:    [name], is a 71 y.o. female, DOB - May 31, 1946, RUE:[id number]  Admit date - 11/26/2016   Admitting Physician Jonah Blue, MD  Outpatient Primary MD for the patient is [name], MD  LOS - 1  Chief Complaint  Patient presents with  . COPD       Brief Narrative   - [name] is a 71 y.o. female with medical history significant of glaucoma, macular degeneration, COPD on 2.5 to 3.0 L home O2, h/o 2 prior intubations, and HTN presenting Because of acute on chronic hypoxic respiratory failure after doing some cleaning and being exposed to certain fumes at home. She also had a temp of 101 at home. He was admitted to the hospital where a chest x-ray and CT scan suggestive pneumonia. She also had some demand ischemia with elevation of troponin.   Subjective:    [name] today has, No headache, No chest pain, No abdominal pain - No Nausea, No new weakness tingling or numbness, No Cough - ++ SOB.    Assessment  & Plan :     1. Sepsis & Acute on chronic hypoxic respiratory failure secondary to pneumonia with COPD exacerbation in a patient with advanced COPD onto a half to 3 L nasal Oxygen at Home.   She Initially Required BiPAP, Currently on Empiric Antibiotics along with IV Steroids, Nebulizer Treatments and Supportive Care, Clinically improved. In the past She Has Required 2 Intubations, pulmonary on board as well. We will continue to monitor closely. Sepsis physiology seems to have resolved for now.   2. COPD exacerbation. Treatment as in #1 above along with IV steroids. Clinically improved.  3. Mild troponin elevation due to demand ischemia from #1 and 2  above. Stable echocardiogram with preserved EF of 55% and no wall motion abnormality,  cardiology following on aspirin and low-dose beta blocker along with Eliquis, EKG nonacute, currently chest pain-free, on aspirin.    4. Paroxysmal atrial fibrillation noted on telemetry. Per cardiology continue beta blocker and anticoagulation. We will transition to Eliquis from heparin drip on  11/28/2016.  5. Hypertension. For now low-dose Bystolic and monitor.  6. Anemia of chronic disease. No acute issues.  7. Acute on chronic leukocytosis. Some worsening due to steroids and pneumonia. She has had some chronic leukocytosis as well, will have outpatient follow-up with hematology postdischarge.  8. ? Lung nodule versus infection. Follow with primary pulmonologist Dr. Lovell SheehanJenkins after discharge.   Diet : DIET SOFT Room service appropriate? Yes; Fluid consistency: Thin   Family Communication  :  NONE  Code Status : Full  Disposition Plan  :  TBD  Consults  :  Cards, Pulmonary  Procedures  :    TTE - - Left ventricle: The cavity size was normal. Systolic function was normal. The estimated ejection fraction was in the range of 55% to 60%. Wall motion was normal; there were no regional wall motion abnormalities. Left ventricular diastolic function parameters were normal. - Aortic valve: Trileaflet; mildly thickened, mildly calcified leaflets. Valve mobility was restricted. There was very mild stenosis. There was moderate regurgitation. - Mitral valve: Transvalvular velocity was within the normal range. There was no evidence for stenosis. There was no regurgitation. - Right ventricle: The cavity size was normal. Wall thickness was normal. Systolic function was normal. - Atrial septum: No defect or patent foramen ovale was identified by color flow Doppler. - Tricuspid valve: There was no regurgitation.  CTA - no PE, pneumonia, ? lung nodule.   DVT Prophylaxis  :   Heparin    Lab Results  Component  Value Date   PLT 216 11/28/2016    Inpatient Medications  Scheduled Meds: . aspirin  81 mg Oral Daily  . azithromycin  500 mg Intravenous Q24H  . cefTRIAXone (ROCEPHIN)  IV  1 g Intravenous Q24H  . feeding supplement (ENSURE ENLIVE)  237 mL Oral BID BM  . furosemide  40 mg Intravenous BID  . insulin aspart  0-15 Units Subcutaneous TID WC  . insulin aspart  0-5 Units Subcutaneous QHS  . ipratropium-albuterol  3 mL Nebulization Q6H  . methylPREDNISolone (SOLU-MEDROL) injection  60 mg Intravenous TID  . mometasone-formoterol  2 puff Inhalation BID  . nebivolol  2.5 mg Oral Daily   Continuous Infusions: . heparin 750 Units/hr (11/28/16 0813)   PRN Meds:.albuterol, ALPRAZolam, guaiFENesin-dextromethorphan  Antibiotics  :    Anti-infectives    Start     Dose/Rate Route Frequency Ordered Stop   11/27/16 0800  cefTRIAXone (ROCEPHIN) 1 g in dextrose 5 % 50 mL IVPB  Status:  Discontinued     1 g 100 mL/hr over 30 Minutes Intravenous  Once 11/27/16 0157 11/27/16 0201   11/27/16 0800  azithromycin (ZITHROMAX) 500 mg in dextrose 5 % 250 mL IVPB  Status:  Discontinued     500 mg 250 mL/hr over 60 Minutes Intravenous  Once 11/27/16 0157 11/27/16 0201   11/27/16 0800  cefTRIAXone (ROCEPHIN) 1 g in dextrose 5 % 50 mL IVPB     1 g 100 mL/hr over 30 Minutes Intravenous Every 24 hours 11/27/16 0201     11/27/16 0800  azithromycin (ZITHROMAX) 500 mg in dextrose 5 % 250 mL IVPB     500 mg 250 mL/hr over 60 Minutes Intravenous Every 24 hours 11/27/16 0201     11/26/16 2345  vancomycin (VANCOCIN) IVPB 1000 mg/200 mL premix     1,000 mg 200 mL/hr over 60 Minutes Intravenous  Once 11/26/16 2332 11/27/16 0152   11/26/16 2345  piperacillin-tazobactam (ZOSYN) IVPB 3.375 g  3.375 g 100 mL/hr over 30 Minutes Intravenous  Once 11/26/16 2332 11/27/16 0047         Objective:   Vitals:   11/27/16 1851 11/27/16 2229 11/28/16 0513 11/28/16 0812  BP:  (!) 101/45 (!) 101/41   Pulse:  88 80     Resp:  17 18   Temp:  98.1 F (36.7 C) 97.5 F (36.4 C)   TempSrc:  Oral Oral   SpO2: 99% 98% 100% 99%  Weight:      Height:        Wt Readings from Last 3 Encounters:  11/27/16 53.3 kg (117 lb 6.4 oz)  09/25/16 53.3 kg (117 lb 6.4 oz)  05/05/16 53.8 kg (118 lb 9.6 oz)     Intake/Output Summary (Last 24 hours) at 11/28/16 1034 Last data filed at 11/28/16 0820  Gross per 24 hour  Intake              340 ml  Output              800 ml  Net             -460 ml     Physical Exam  Awake Alert, Oriented X 3, No new F.N deficits, Normal affect [place].AT,PERRAL Supple Neck,No JVD, No cervical lymphadenopathy appriciated.  Symmetrical Chest wall movement, Good air movement bilaterally,Does have expiratory wheezes  RRR,No Gallops,Rubs or new Murmurs, No Parasternal Heave +ve B.Sounds, Abd Soft, No tenderness, No organomegaly appriciated, No rebound - guarding or rigidity. No Cyanosis, Clubbing or edema, No new Rash or bruise      Data Review:    CBC  Recent Labs Lab 11/26/16 2307 11/26/16 2316 11/27/16 0948 11/28/16 0505  WBC 33.0*  --  35.1* 31.1*  HGB 11.1* 12.2 9.7* 9.1*  HCT 34.1* 36.0 29.8* 27.7*  PLT 281  --  230 216  MCV 87.2  --  86.1 86.8  MCH 28.4  --  28.0 28.5  MCHC 32.6  --  32.6 32.9  RDW 13.3  --  13.1 13.6  LYMPHSABS 0.7  --  0.7  --   MONOABS 2.6*  --  2.1*  --   EOSABS 0.0  --  0.0  --   BASOSABS 0.0  --  0.0  --     Chemistries   Recent Labs Lab 11/26/16 2307 11/26/16 2316 11/27/16 0948 11/28/16 0505  NA 128* 128* 131* 130*  K 4.2 4.1 3.9 4.2  CL 89* 87* 93* 94*  CO2 30  --  29 27  GLUCOSE 179* 186* 169* 147*  BUN 11 14 10 11   CREATININE 0.73 0.60 0.58 0.57  CALCIUM 9.2  --  8.5* 8.5*   ------------------------------------------------------------------------------------------------------------------ No results for input(s): CHOL, HDL, LDLCALC, TRIG, CHOLHDL, LDLDIRECT in the last 72 hours.  Lab Results  Component Value Date    HGBA1C 5.5 11/27/2016   ------------------------------------------------------------------------------------------------------------------ No results for input(s): TSH, T4TOTAL, T3FREE, THYROIDAB in the last 72 hours.  Invalid input(s): FREET3 ------------------------------------------------------------------------------------------------------------------ No results for input(s): VITAMINB12, FOLATE, FERRITIN, TIBC, IRON, RETICCTPCT in the last 72 hours.  Coagulation profile  Recent Labs Lab 11/27/16 0245 11/27/16 0948  INR 0.99 1.10    No results for input(s): DDIMER in the last 72 hours.  Cardiac Enzymes  Recent Labs Lab 11/27/16 0245 11/27/16 0948 11/27/16 1305  TROPONINI 1.19* 1.13* 1.28*   ------------------------------------------------------------------------------------------------------------------    Component Value Date/Time   BNP 55.7 11/26/2016 2307    Micro Results Recent Results (from  the past 240 hour(s))  Blood culture (routine x 2)     Status: None (Preliminary result)   Collection Time: 11/26/16 11:15 PM  Result Value Ref Range Status   Specimen Description BLOOD RIGHT ARM  Final   Special Requests IN PEDIATRIC BOTTLE  Final   Culture NO GROWTH 1 DAY  Final   Report Status PENDING  Incomplete  Blood culture (routine x 2)     Status: None (Preliminary result)   Collection Time: 11/26/16 11:50 PM  Result Value Ref Range Status   Specimen Description BLOOD RIGHT HAND  Final   Special Requests IN PEDIATRIC BOTTLE  Final   Culture NO GROWTH 1 DAY  Final   Report Status PENDING  Incomplete  Respiratory Panel by PCR     Status: None   Collection Time: 11/27/16  2:46 AM  Result Value Ref Range Status   Adenovirus NOT DETECTED NOT DETECTED Final   Coronavirus 229E NOT DETECTED NOT DETECTED Final   Coronavirus HKU1 NOT DETECTED NOT DETECTED Final   Coronavirus NL63 NOT DETECTED NOT DETECTED Final   Coronavirus OC43 NOT DETECTED NOT DETECTED  Final   Metapneumovirus NOT DETECTED NOT DETECTED Final   Rhinovirus / Enterovirus NOT DETECTED NOT DETECTED Final   Influenza A NOT DETECTED NOT DETECTED Final   Influenza B NOT DETECTED NOT DETECTED Final   Parainfluenza Virus 1 NOT DETECTED NOT DETECTED Final   Parainfluenza Virus 2 NOT DETECTED NOT DETECTED Final   Parainfluenza Virus 3 NOT DETECTED NOT DETECTED Final   Parainfluenza Virus 4 NOT DETECTED NOT DETECTED Final   Respiratory Syncytial Virus NOT DETECTED NOT DETECTED Final   Bordetella pertussis NOT DETECTED NOT DETECTED Final   Chlamydophila pneumoniae NOT DETECTED NOT DETECTED Final   Mycoplasma pneumoniae NOT DETECTED NOT DETECTED Final    Radiology Reports Ct Angio Chest Pe W Or Wo Contrast  Result Date: 11/27/2016 CLINICAL DATA:  Shortness of Breath EXAM: CT ANGIOGRAPHY CHEST WITH CONTRAST TECHNIQUE: Multidetector CT imaging of the chest was performed using the standard protocol during bolus administration of intravenous contrast. Multiplanar CT image reconstructions and MIPs were obtained to evaluate the vascular anatomy. CONTRAST:  65 mL Isovue 370. COMPARISON:  11/27/2015 FINDINGS: Cardiovascular: Thoracic aorta demonstrates some calcifications without aneurysmal dilatation or dissection. No significant cardiac enlargement is noted. Mild coronary calcifications are seen. The pulmonary artery shows a normal branching pattern. No filling defect to suggest pulmonary embolism is seen. Mediastinum/Nodes: The thoracic inlet is within normal limits. Scattered small hilar lymph nodes are seen. No significant mediastinal adenopathy is noted. The esophagus as visualized is within normal limits. Lungs/Pleura: Diffuse emphysematous changes are noted. In the left upper lobe there is an 8 mm somewhat lobular nodular density with surrounding increased infiltrate. Bilateral lower lobe infiltrates are noted right greater than left. Some associated retained secretions are noted within the  bronchial tree bilaterally. No other focal nodular density is seen. Upper Abdomen: Visualized upper abdomen is within normal limits. Musculoskeletal: No acute bony abnormality is noted. Review of the MIP images confirms the above findings. IMPRESSION: No evidence of pulmonary embolism. Bilateral lower lobe infiltrates right greater than left without sizable effusion. Lobular nodular density in the left upper lobe with some surrounding infiltrate. This may all be postinflammatory in nature. Non-contrast chest CT at 6-12 months is recommended. If the nodule is stable at time of repeat CT, then future CT at 18-24 months (from today's scan) is considered optional for low-risk patients, but is recommended  for high-risk patients. This recommendation follows the consensus statement: Guidelines for Management of Incidental Pulmonary Nodules Detected on CT Images: From the Fleischner Society 2017; Radiology 2017; 284:228-243. Electronically Signed   By: Alcide Clever M.D.   On: 11/27/2016 11:07   Dg Chest Port 1 View  Result Date: 11/27/2016 CLINICAL DATA:  Shortness of breath. EXAM: PORTABLE CHEST 1 VIEW COMPARISON:  11/26/2016 FINDINGS: Lungs are hyperinflated. Heart size is normal. There is patchy infiltrate involving the medial right lower lobe. Developing infiltrate also noted in the medial left lower lobe. No pulmonary edema. Remote left rib fracture. IMPRESSION: Developing bilateral lower lobe infiltrates. Electronically Signed   By: Norva Pavlov M.D.   On: 11/27/2016 07:32   Dg Chest Portable 1 View  Result Date: 11/26/2016 CLINICAL DATA:  Shortness of breath, history of COPD EXAM: PORTABLE CHEST 1 VIEW COMPARISON:  09/11/2016 FINDINGS: Hyperinflation. Focal opacity at the right lung base with possible tiny right effusion. Normal cardiomediastinal silhouette with atherosclerosis. No pneumothorax. IMPRESSION: Hyperinflation with right basilar opacity suspicious for an infiltrate. Possible tiny right pleural  effusion. Electronically Signed   By: Jasmine Pang M.D.   On: 11/26/2016 22:40    Time Spent in minutes  30   Priyana Mccarey K M.D on 11/28/2016 at 10:34 AM  Between 7am to 7pm - Pager - [phone]  After 7pm go to [email or web address] - password [facility]  [facility] -  Office  [phone]

## 2016-11-28 NOTE — Evaluation (Signed)
Physical Therapy Evaluation Patient Details Name: Kaitlyn MoralesFrances L Festa MRN: [id number] DOB: October 14, 1945 Today's Date: 11/28/2016   History of Present Illness  71 y.o. female with medical history significant of glaucoma, macular degeneration, COPD on 2.5 to 3.0 L home O2, h/o 2 prior intubations, and HTN presenting Because of acute on chronic hypoxic respiratory failure. chest x-ray and CT scan suggestive pneumonia. She also had some demand ischemia with elevation of troponin.  Clinical Impression  Pt admitted with above complications. Pt currently with functional limitations due to the deficits listed below (see PT Problem List). Able to ambulate in hallway on 3L supplemental O2 with SpO2 ranging from 91-96% and mild/mod dyspnea. May benefit from HHPT follow-up to improve her endurance and independence with functional mobility. Otherwise seems to be doing much better today. Pt will benefit from skilled PT to increase their independence and safety with mobility to allow discharge to the venue listed below.       Follow Up Recommendations Home health PT (Follow up for home safety eval)    Equipment Recommendations  None recommended by PT    Recommendations for Other Services       Precautions / Restrictions Precautions Precautions: Fall Restrictions Weight Bearing Restrictions: No      Mobility  Bed Mobility               General bed mobility comments: sitting in recliner when PT entered room  Transfers Overall transfer level: Needs assistance Equipment used: None Transfers: Sit to/from Stand Sit to Stand: Supervision         General transfer comment: Supervision for safety. Good power up, with no significant instability noted.  Ambulation/Gait Ambulation/Gait assistance: Supervision Ambulation Distance (Feet): 80 Feet Assistive device: None Gait Pattern/deviations: Decreased stride length;Step-through pattern Gait velocity: decreased Gait velocity interpretation:  Below normal speed for age/gender General Gait Details: Supervision for safety. SpO2 down to 91% on 3L supplemental O2. Mild-Mod dyspnea. Required 1 standing restbreak which improved SpO2 to 96%. No significant instability noted with gait.  No buckling.  Stairs            Wheelchair Mobility    Modified Rankin (Stroke Patients Only)       Balance Overall balance assessment: Needs assistance Sitting-balance support: No upper extremity supported;Feet supported Sitting balance-Leahy Scale: Normal     Standing balance support: No upper extremity supported Standing balance-Leahy Scale: Good                               Pertinent Vitals/Pain Pain Assessment: No/denies pain Pain Intervention(s): Monitored during session    Home Living Family/patient expects to be discharged to:: Private residence Living Arrangements: Alone Available Help at Discharge: Friend(s);Family (minimal) Type of Home: House Home Access: Stairs to enter Entrance Stairs-Rails: Doctor, general practiceight;Left Entrance Stairs-Number of Steps: 3 Home Layout: One level Home Equipment: Environmental consultantWalker - 2 wheels Additional Comments: some PRN help from adult children    Prior Function Level of Independence: Independent         Comments: "I take my time with things."     Hand Dominance        Extremity/Trunk Assessment   Upper Extremity Assessment Upper Extremity Assessment: Defer to OT evaluation    Lower Extremity Assessment Lower Extremity Assessment: Generalized weakness    Cervical / Trunk Assessment Cervical / Trunk Assessment: Normal  Communication   Communication: No difficulties  Cognition Arousal/Alertness: Awake/alert Behavior During Therapy: Anxious;WFL for tasks  assessed/performed Overall Cognitive Status: Within Functional Limits for tasks assessed                      General Comments      Exercises     Assessment/Plan    PT Assessment Patient needs continued PT  services  PT Problem List Decreased strength;Decreased activity tolerance;Decreased balance;Decreased mobility;Cardiopulmonary status limiting activity       PT Treatment Interventions DME instruction;Gait training;Stair training;Functional mobility training;Therapeutic activities;Therapeutic exercise;Balance training;Neuromuscular re-education;Patient/family education    PT Goals (Current goals can be found in the Care Plan section)  Acute Rehab PT Goals Patient Stated Goal: Get lunch earlier PT Goal Formulation: With patient Time For Goal Achievement: 12/12/16 Potential to Achieve Goals: Good    Frequency Min 3X/week   Barriers to discharge Decreased caregiver support      Co-evaluation               End of Session Equipment Utilized During Treatment: Gait belt;Oxygen Activity Tolerance: Patient tolerated treatment well Patient left: in chair;with call bell/phone within reach;with chair alarm set (Resp therapist in room)   PT Visit Diagnosis: Muscle weakness (generalized) (M62.81);Difficulty in walking, not elsewhere classified (R26.2)         Time: 1610-9604 PT Time Calculation (min) (ACUTE ONLY): 27 min   Charges:   PT Evaluation $PT Eval Low Complexity: 1 Procedure PT Treatments $Gait Training: 8-22 mins   PT G CodesBerton Mount 11/28/2016, 3:17 PM Sunday [place], [place] [phone]

## 2016-11-29 LAB — GLUCOSE, CAPILLARY
Glucose-Capillary: 143 mg/dL — ABNORMAL HIGH (ref 65–99)
Glucose-Capillary: 176 mg/dL — ABNORMAL HIGH (ref 65–99)
Glucose-Capillary: 142 mg/dL — ABNORMAL HIGH (ref 65–99)
Glucose-Capillary: 153 mg/dL — ABNORMAL HIGH (ref 65–99)

## 2016-11-29 LAB — CBC
HEMATOCRIT: 31.4 % — AB (ref 36.0–46.0)
HEMOGLOBIN: 10 g/dL — AB (ref 12.0–15.0)
MCH: 27.6 pg (ref 26.0–34.0)
MCHC: 31.8 g/dL (ref 30.0–36.0)
MCV: 86.7 fL (ref 78.0–100.0)
Platelets: 265 10*3/uL (ref 150–400)
RBC: 3.62 MIL/uL — ABNORMAL LOW (ref 3.87–5.11)
RDW: 13.3 % (ref 11.5–15.5)
WBC: 22 10*3/uL — ABNORMAL HIGH (ref 4.0–10.5)

## 2016-11-29 LAB — BASIC METABOLIC PANEL
ANION GAP: 13 (ref 5–15)
BUN: 19 mg/dL (ref 6–20)
CHLORIDE: 88 mmol/L — AB (ref 101–111)
CO2: 32 mmol/L (ref 22–32)
CREATININE: 0.74 mg/dL (ref 0.44–1.00)
Calcium: 9 mg/dL (ref 8.9–10.3)
GFR calc non Af Amer: >60 mL/min (ref >60)
Glucose, Bld: 268 mg/dL — ABNORMAL HIGH (ref 65–99)
Potassium: 3.3 mmol/L — ABNORMAL LOW (ref 3.5–5.1)
Sodium: 133 mmol/L — ABNORMAL LOW (ref 135–145)

## 2016-11-29 MED ORDER — IPRATROPIUM-ALBUTEROL 0.5-2.5 (3) MG/3ML IN SOLN
3.0000 mL | Freq: Three times a day (TID) | RESPIRATORY_TRACT | Status: DC
  Administered 2016-11-29 – 2016-12-03 (×12): 3 mL via RESPIRATORY_TRACT
  Filled 2016-11-29 (×14): qty 3

## 2016-11-29 MED ORDER — POTASSIUM CHLORIDE CRYS ER 10 MEQ PO TBCR
40.0000 meq | EXTENDED_RELEASE_TABLET | Freq: Four times a day (QID) | ORAL | Status: AC
  Administered 2016-11-29 – 2016-11-30 (×2): 40 meq via ORAL
  Filled 2016-11-29: qty 2
  Filled 2016-11-29: qty 4

## 2016-11-29 NOTE — Discharge Instructions (Signed)

## 2016-11-29 NOTE — Progress Notes (Signed)
PROGRESS NOTE                                                                                                                                                                                                             Patient Demographics:    [name], is a 71 y.o. female, DOB - [birth year], WUJ:[id number]  Admit date - 11/26/2016   Admitting Physician Jonah Blue, MD  Outpatient Primary MD for the patient is [name], MD  LOS - 2  Chief Complaint  Patient presents with  . COPD       Brief Narrative   - LAMESHIA HYPOLITE is a 71 y.o. female with medical history significant of glaucoma, macular degeneration, COPD on 2.5 to 3.0 L home O2, h/o 2 prior intubations, and HTN presenting Because of acute on chronic hypoxic respiratory failure after doing some cleaning and being exposed to certain fumes at home. She also had a temp of 101 at home. He was admitted to the hospital where a chest x-ray and CT scan suggestive pneumonia. She also had some demand ischemia with elevation of troponin.   Subjective:    [name] today has, No headache, No chest pain, No abdominal pain - No Nausea, No new weakness tingling or numbness, No Cough - ++ SOB.    Assessment  & Plan :     1. Sepsis & Acute on chronic hypoxic respiratory failure secondary to pneumonia with COPD exacerbation in a patient with advanced COPD onto a half to 3 L nasal Oxygen at Home.   She Initially Required BiPAP, Currently on Empiric Antibiotics along with IV Steroids, Nebulizer Treatments and Supportive Care, Clinically improved. In the past She Has Required 2 Intubations, pulmonary on board as well. We will continue to monitor closely. Sepsis physiology seems to have resolved for now. Repeat CXR and Labs.   2. COPD exacerbation. Treatment as in #1 above along with IV steroids. Clinically improved.  3. Mild troponin elevation due to demand  ischemia from #1 and 2 above. Stable echocardiogram with preserved EF of 55% and no wall motion abnormality,  cardiology following on aspirin and low-dose beta blocker along with Eliquis, EKG nonacute, currently chest pain-free, on aspirin.    4. Paroxysmal atrial fibrillation noted on telemetry. Per cardiology continue beta blocker and anticoagulation. We will transition to Eliquis  from heparin drip on 11/28/2016.  5. Hypertension. For now low-dose Bystolic and monitor.  6. Anemia of chronic disease. No acute issues.  7. Acute on chronic leukocytosis. Some worsening due to steroids and pneumonia. She has had some chronic leukocytosis as well, will have outpatient follow-up with hematology post discharge.  8. ? Lung nodule versus infection. Follow with primary pulmonologist Dr. Lovell SheehanJenkins after discharge.   Diet : DIET SOFT Room service appropriate? Yes; Fluid consistency: Thin   Family Communication  :  NONE  Code Status : Full  Disposition Plan  :  Likely DC in 1-2 days  Consults  :  Cards, Pulmonary  Procedures  :    TTE - - Left ventricle: The cavity size was normal. Systolic function was normal. The estimated ejection fraction was in the range of 55% to 60%. Wall motion was normal; there were no regional wall motion abnormalities. Left ventricular diastolic function parameters were normal. - Aortic valve: Trileaflet; mildly thickened, mildly calcified leaflets. Valve mobility was restricted. There was very mild stenosis. There was moderate regurgitation. - Mitral valve: Transvalvular velocity was within the normal range. There was no evidence for stenosis. There was no regurgitation. - Right ventricle: The cavity size was normal. Wall thickness was normal. Systolic function was normal. - Atrial septum: No defect or patent foramen ovale was identified by color flow Doppler. - Tricuspid valve: There was no regurgitation.  CTA - no PE, pneumonia, ? lung nodule.   DVT Prophylaxis  :    Heparin    Lab Results  Component Value Date   PLT 216 11/28/2016    Inpatient Medications  Scheduled Meds: . apixaban  5 mg Oral BID  . aspirin  81 mg Oral Daily  . azithromycin  500 mg Intravenous Q24H  . cefTRIAXone (ROCEPHIN)  IV  1 g Intravenous Q24H  . feeding supplement (ENSURE ENLIVE)  237 mL Oral BID BM  . furosemide  40 mg Intravenous BID  . insulin aspart  0-15 Units Subcutaneous TID WC  . insulin aspart  0-5 Units Subcutaneous QHS  . ipratropium-albuterol  3 mL Nebulization TID  . methylPREDNISolone (SOLU-MEDROL) injection  60 mg Intravenous TID  . mometasone-formoterol  2 puff Inhalation BID  . nebivolol  2.5 mg Oral Daily   Continuous Infusions:  PRN Meds:.albuterol, ALPRAZolam, guaiFENesin-dextromethorphan  Antibiotics  :    Anti-infectives    Start     Dose/Rate Route Frequency Ordered Stop   11/27/16 0800  cefTRIAXone (ROCEPHIN) 1 g in dextrose 5 % 50 mL IVPB  Status:  Discontinued     1 g 100 mL/hr over 30 Minutes Intravenous  Once 11/27/16 0157 11/27/16 0201   11/27/16 0800  azithromycin (ZITHROMAX) 500 mg in dextrose 5 % 250 mL IVPB  Status:  Discontinued     500 mg 250 mL/hr over 60 Minutes Intravenous  Once 11/27/16 0157 11/27/16 0201   11/27/16 0800  cefTRIAXone (ROCEPHIN) 1 g in dextrose 5 % 50 mL IVPB     1 g 100 mL/hr over 30 Minutes Intravenous Every 24 hours 11/27/16 0201     11/27/16 0800  azithromycin (ZITHROMAX) 500 mg in dextrose 5 % 250 mL IVPB     500 mg 250 mL/hr over 60 Minutes Intravenous Every 24 hours 11/27/16 0201     11/26/16 2345  vancomycin (VANCOCIN) IVPB 1000 mg/200 mL premix     1,000 mg 200 mL/hr over 60 Minutes Intravenous  Once 11/26/16 2332 11/27/16 0152   11/26/16 2345  piperacillin-tazobactam (ZOSYN) IVPB 3.375 g     3.375 g 100 mL/hr over 30 Minutes Intravenous  Once 11/26/16 2332 11/27/16 0047         Objective:   Vitals:   11/28/16 1453 11/28/16 2052 11/29/16 0129 11/29/16 0538  BP: 125/61  (!) 103/53 (!)  130/56  Pulse: 92 88 71 86  Resp: 18 18 20  (!) 22  Temp: 98.2 F (36.8 C)  99.2 F (37.3 C) 98 F (36.7 C)  TempSrc: Oral   Oral  SpO2: 96% 95% 100% 96%  Weight:      Height:        Wt Readings from Last 3 Encounters:  11/27/16 53.3 kg (117 lb 6.4 oz)  09/25/16 53.3 kg (117 lb 6.4 oz)  05/05/16 53.8 kg (118 lb 9.6 oz)     Intake/Output Summary (Last 24 hours) at 11/29/16 1010 Last data filed at 11/29/16 0954  Gross per 24 hour  Intake              200 ml  Output             1450 ml  Net            -1250 ml     Physical Exam  Awake Alert, Oriented X 3, No new F.N deficits, Normal affect [place].AT,PERRAL Supple Neck,No JVD, No cervical lymphadenopathy appriciated.  Symmetrical Chest wall movement, Good air movement bilaterally,Does have expiratory wheezes  RRR,No Gallops,Rubs or new Murmurs, No Parasternal Heave +ve B.Sounds, Abd Soft, No tenderness, No organomegaly appriciated, No rebound - guarding or rigidity. No Cyanosis, Clubbing or edema, No new Rash or bruise      Data Review:    CBC  Recent Labs Lab 11/26/16 2307 11/26/16 2316 11/27/16 0948 11/28/16 0505  WBC 33.0*  --  35.1* 31.1*  HGB 11.1* 12.2 9.7* 9.1*  HCT 34.1* 36.0 29.8* 27.7*  PLT 281  --  230 216  MCV 87.2  --  86.1 86.8  MCH 28.4  --  28.0 28.5  MCHC 32.6  --  32.6 32.9  RDW 13.3  --  13.1 13.6  LYMPHSABS 0.7  --  0.7  --   MONOABS 2.6*  --  2.1*  --   EOSABS 0.0  --  0.0  --   BASOSABS 0.0  --  0.0  --     Chemistries   Recent Labs Lab 11/26/16 2307 11/26/16 2316 11/27/16 0948 11/28/16 0505  NA 128* 128* 131* 130*  K 4.2 4.1 3.9 4.2  CL 89* 87* 93* 94*  CO2 30  --  29 27  GLUCOSE 179* 186* 169* 147*  BUN 11 14 10 11   CREATININE 0.73 0.60 0.58 0.57  CALCIUM 9.2  --  8.5* 8.5*   ------------------------------------------------------------------------------------------------------------------ No results for input(s): CHOL, HDL, LDLCALC, TRIG, CHOLHDL, LDLDIRECT in the last  72 hours.  Lab Results  Component Value Date   HGBA1C 5.5 11/27/2016   ------------------------------------------------------------------------------------------------------------------ No results for input(s): TSH, T4TOTAL, T3FREE, THYROIDAB in the last 72 hours.  Invalid input(s): FREET3 ------------------------------------------------------------------------------------------------------------------ No results for input(s): VITAMINB12, FOLATE, FERRITIN, TIBC, IRON, RETICCTPCT in the last 72 hours.  Coagulation profile  Recent Labs Lab 11/27/16 0245 11/27/16 0948  INR 0.99 1.10    No results for input(s): DDIMER in the last 72 hours.  Cardiac Enzymes  Recent Labs Lab 11/27/16 0245 11/27/16 0948 11/27/16 1305  TROPONINI 1.19* 1.13* 1.28*   ------------------------------------------------------------------------------------------------------------------    Component Value Date/Time   BNP 55.7  11/26/2016 2307    Micro Results Recent Results (from the past 240 hour(s))  Blood culture (routine x 2)     Status: None (Preliminary result)   Collection Time: 11/26/16 11:15 PM  Result Value Ref Range Status   Specimen Description BLOOD RIGHT ARM  Final   Special Requests IN PEDIATRIC BOTTLE  Final   Culture NO GROWTH 1 DAY  Final   Report Status PENDING  Incomplete  Blood culture (routine x 2)     Status: None (Preliminary result)   Collection Time: 11/26/16 11:50 PM  Result Value Ref Range Status   Specimen Description BLOOD RIGHT HAND  Final   Special Requests IN PEDIATRIC BOTTLE  Final   Culture NO GROWTH 1 DAY  Final   Report Status PENDING  Incomplete  Respiratory Panel by PCR     Status: None   Collection Time: 11/27/16  2:46 AM  Result Value Ref Range Status   Adenovirus NOT DETECTED NOT DETECTED Final   Coronavirus 229E NOT DETECTED NOT DETECTED Final   Coronavirus HKU1 NOT DETECTED NOT DETECTED Final   Coronavirus NL63 NOT DETECTED NOT DETECTED  Final   Coronavirus OC43 NOT DETECTED NOT DETECTED Final   Metapneumovirus NOT DETECTED NOT DETECTED Final   Rhinovirus / Enterovirus NOT DETECTED NOT DETECTED Final   Influenza A NOT DETECTED NOT DETECTED Final   Influenza B NOT DETECTED NOT DETECTED Final   Parainfluenza Virus 1 NOT DETECTED NOT DETECTED Final   Parainfluenza Virus 2 NOT DETECTED NOT DETECTED Final   Parainfluenza Virus 3 NOT DETECTED NOT DETECTED Final   Parainfluenza Virus 4 NOT DETECTED NOT DETECTED Final   Respiratory Syncytial Virus NOT DETECTED NOT DETECTED Final   Bordetella pertussis NOT DETECTED NOT DETECTED Final   Chlamydophila pneumoniae NOT DETECTED NOT DETECTED Final   Mycoplasma pneumoniae NOT DETECTED NOT DETECTED Final    Radiology Reports Ct Angio Chest Pe W Or Wo Contrast  Result Date: 11/27/2016 CLINICAL DATA:  Shortness of Breath EXAM: CT ANGIOGRAPHY CHEST WITH CONTRAST TECHNIQUE: Multidetector CT imaging of the chest was performed using the standard protocol during bolus administration of intravenous contrast. Multiplanar CT image reconstructions and MIPs were obtained to evaluate the vascular anatomy. CONTRAST:  65 mL Isovue 370. COMPARISON:  11/27/2015 FINDINGS: Cardiovascular: Thoracic aorta demonstrates some calcifications without aneurysmal dilatation or dissection. No significant cardiac enlargement is noted. Mild coronary calcifications are seen. The pulmonary artery shows a normal branching pattern. No filling defect to suggest pulmonary embolism is seen. Mediastinum/Nodes: The thoracic inlet is within normal limits. Scattered small hilar lymph nodes are seen. No significant mediastinal adenopathy is noted. The esophagus as visualized is within normal limits. Lungs/Pleura: Diffuse emphysematous changes are noted. In the left upper lobe there is an 8 mm somewhat lobular nodular density with surrounding increased infiltrate. Bilateral lower lobe infiltrates are noted right greater than left. Some  associated retained secretions are noted within the bronchial tree bilaterally. No other focal nodular density is seen. Upper Abdomen: Visualized upper abdomen is within normal limits. Musculoskeletal: No acute bony abnormality is noted. Review of the MIP images confirms the above findings. IMPRESSION: No evidence of pulmonary embolism. Bilateral lower lobe infiltrates right greater than left without sizable effusion. Lobular nodular density in the left upper lobe with some surrounding infiltrate. This may all be postinflammatory in nature. Non-contrast chest CT at 6-12 months is recommended. If the nodule is stable at time of repeat CT, then future CT at 18-24 months (from today's  scan) is considered optional for low-risk patients, but is recommended for high-risk patients. This recommendation follows the consensus statement: Guidelines for Management of Incidental Pulmonary Nodules Detected on CT Images: From the Fleischner Society 2017; Radiology 2017; 284:228-243. Electronically Signed   By: Alcide Clever M.D.   On: 11/27/2016 11:07   Dg Chest Port 1 View  Result Date: 11/27/2016 CLINICAL DATA:  Shortness of breath. EXAM: PORTABLE CHEST 1 VIEW COMPARISON:  11/26/2016 FINDINGS: Lungs are hyperinflated. Heart size is normal. There is patchy infiltrate involving the medial right lower lobe. Developing infiltrate also noted in the medial left lower lobe. No pulmonary edema. Remote left rib fracture. IMPRESSION: Developing bilateral lower lobe infiltrates. Electronically Signed   By: Norva Pavlov M.D.   On: 11/27/2016 07:32   Dg Chest Portable 1 View  Result Date: 11/26/2016 CLINICAL DATA:  Shortness of breath, history of COPD EXAM: PORTABLE CHEST 1 VIEW COMPARISON:  09/11/2016 FINDINGS: Hyperinflation. Focal opacity at the right lung base with possible tiny right effusion. Normal cardiomediastinal silhouette with atherosclerosis. No pneumothorax. IMPRESSION: Hyperinflation with right basilar opacity  suspicious for an infiltrate. Possible tiny right pleural effusion. Electronically Signed   By: Jasmine Pang M.D.   On: 11/26/2016 22:40    Time Spent in minutes  30   SINGH,PRASHANT K M.D on 11/29/2016 at 10:10 AM  Between 7am to 7pm - Pager - [phone]  After 7pm go to [email or web address] - password [email or web address]  [facility] -  Office  [phone]

## 2016-11-30 ENCOUNTER — Inpatient Hospital Stay (HOSPITAL_COMMUNITY): Payer: Medicare HMO

## 2016-11-30 DIAGNOSIS — F419 Anxiety disorder, unspecified: Secondary | ICD-10-CM

## 2016-11-30 DIAGNOSIS — I200 Unstable angina: Secondary | ICD-10-CM

## 2016-11-30 LAB — BASIC METABOLIC PANEL
Anion gap: 8 (ref 5–15)
BUN: 20 mg/dL (ref 6–20)
CHLORIDE: 92 mmol/L — AB (ref 101–111)
CO2: 32 mmol/L (ref 22–32)
CREATININE: 0.63 mg/dL (ref 0.44–1.00)
Calcium: 9.1 mg/dL (ref 8.9–10.3)
GFR calc Af Amer: >60 mL/min (ref >60)
GFR calc non Af Amer: >60 mL/min (ref >60)
GLUCOSE: 142 mg/dL — AB (ref 65–99)
Potassium: 5 mmol/L (ref 3.5–5.1)
SODIUM: 132 mmol/L — AB (ref 135–145)

## 2016-11-30 LAB — CBC
HCT: 31.5 % — ABNORMAL LOW (ref 36.0–46.0)
HEMOGLOBIN: 10.1 g/dL — AB (ref 12.0–15.0)
MCH: 27.7 pg (ref 26.0–34.0)
MCHC: 32.1 g/dL (ref 30.0–36.0)
MCV: 86.3 fL (ref 78.0–100.0)
Platelets: 291 10*3/uL (ref 150–400)
RBC: 3.65 MIL/uL — ABNORMAL LOW (ref 3.87–5.11)
RDW: 13.2 % (ref 11.5–15.5)
WBC: 17.2 10*3/uL — ABNORMAL HIGH (ref 4.0–10.5)

## 2016-11-30 LAB — GLUCOSE, CAPILLARY
GLUCOSE-CAPILLARY: 209 mg/dL — AB (ref 65–99)
GLUCOSE-CAPILLARY: 184 mg/dL — AB (ref 65–99)
Glucose-Capillary: 187 mg/dL — ABNORMAL HIGH (ref 65–99)
Glucose-Capillary: 113 mg/dL — ABNORMAL HIGH (ref 65–99)

## 2016-11-30 MED ORDER — PREDNISONE 20 MG PO TABS
40.0000 mg | ORAL_TABLET | Freq: Two times a day (BID) | ORAL | Status: DC
  Administered 2016-11-30: 40 mg via ORAL
  Filled 2016-11-30: qty 2

## 2016-11-30 MED ORDER — FUROSEMIDE 10 MG/ML IJ SOLN
40.0000 mg | Freq: Once | INTRAMUSCULAR | Status: AC
  Administered 2016-11-30: 40 mg via INTRAVENOUS
  Filled 2016-11-30: qty 4

## 2016-11-30 MED ORDER — APIXABAN 5 MG PO TABS
5.0000 mg | ORAL_TABLET | Freq: Two times a day (BID) | ORAL | Status: DC
  Administered 2016-11-30 – 2016-12-03 (×6): 5 mg via ORAL
  Filled 2016-11-30 (×6): qty 1

## 2016-11-30 MED ORDER — METHYLPREDNISOLONE SODIUM SUCC 125 MG IJ SOLR
60.0000 mg | Freq: Two times a day (BID) | INTRAMUSCULAR | Status: DC
  Administered 2016-11-30: 60 mg via INTRAVENOUS
  Filled 2016-11-30: qty 2

## 2016-11-30 NOTE — Progress Notes (Signed)
PROGRESS NOTE                                                                                                                                                                                                             Patient Demographics:    [name], is a 71 y.o. female, DOB - November 06, 1945, ZOX:[id number]  Admit date - 11/26/2016   Admitting Physician Jonah Blue, MD  Outpatient Primary MD for the patient is [name], MD  LOS - 3  Chief Complaint  Patient presents with  . COPD       Brief Narrative   - KENZLEI Ibarra is a 71 y.o. female with medical history significant of glaucoma, macular degeneration, COPD on 2.5 to 3.0 L home O2, h/o 2 prior intubations, and HTN presenting Because of acute on chronic hypoxic respiratory failure after doing some cleaning and being exposed to certain fumes at home. She also had a temp of 101 at home. He was admitted to the hospital where a chest x-ray and CT scan suggestive pneumonia. She also had some demand ischemia with elevation of troponin.   Subjective:    [name] today has, No headache, No chest pain, No abdominal pain - No Nausea, No new weakness tingling or numbness, No Cough - improved SOB.   Note patient tends to talk for over 25-30 minutes on a daily basis on various non-medicine related topics. She says that she is lonely and she has no one to talk to at home as her son is usually busy at work and out of town, she has few friends, she tried to become friends with a woman at the [facility] but could not keep up, she is constantly bored, on 11/30/2016 she started talking about her pets and how no one becomes a friend. I had to politely excuse myself after 25 minutes.   Assessment  & Plan :     1. Sepsis & Acute on chronic hypoxic respiratory failure secondary to pneumonia with COPD exacerbation in a patient with advanced COPD onto a half to 3 L nasal Oxygen at  Home.   She Initially Required BiPAP, Currently on Empiric Antibiotics along with IV Steroids, Nebulizer Treatments and Supportive Care, Clinically improved. In the past She Has Required 2 Intubations, pulmonary on board as well. We will continue to monitor closely.  Sepsis physiology seems to have resolved for now, She is clinically much improved and we will likely discharge her on 12/01/2016.   2. COPD exacerbation. Treatment as in #1 above along with IV steroids. Clinically improved.  3. Mild troponin elevation due to demand ischemia from #1 and 2 above. Stable echocardiogram with preserved EF of 55% and no wall motion abnormality,  cardiology following on aspirin and low-dose beta blocker along with Eliquis, EKG nonacute, currently chest pain-free, on aspirin.    4. Paroxysmal atrial fibrillation noted on telemetry. Per cardiology continue beta blocker and anticoagulation. We will transition to Eliquis from heparin drip on 11/28/2016.  5. Hypertension. For now low-dose Bystolic and monitor.  6. Anemia of chronic disease. No acute issues.  7. Acute on chronic leukocytosis. Some worsening due to steroids and pneumonia. She has had some chronic leukocytosis as well, will have outpatient follow-up with hematology post discharge.  8. ? Lung nodule versus infection. Follow with primary pulmonologist Dr. Lovell Sheehan after discharge.   Diet : DIET SOFT Room service appropriate? Yes; Fluid consistency: Thin   Family Communication  :  NONE  Code Status : Full  Disposition Plan  :  Likely DC in am with HHPT vs SNF  Consults  :  Cards, Pulmonary  Procedures  :    TTE - - Left ventricle: The cavity size was normal. Systolic function was normal. The estimated ejection fraction was in the range of 55% to 60%. Wall motion was normal; there were no regional wall motion abnormalities. Left ventricular diastolic function parameters were normal. - Aortic valve: Trileaflet; mildly thickened, mildly  calcified leaflets. Valve mobility was restricted. There was very mild stenosis. There was moderate regurgitation. - Mitral valve: Transvalvular velocity was within the normal range. There was no evidence for stenosis. There was no regurgitation. - Right ventricle: The cavity size was normal. Wall thickness was normal. Systolic function was normal. - Atrial septum: No defect or patent foramen ovale was identified by color flow Doppler. - Tricuspid valve: There was no regurgitation.  CTA - no PE, pneumonia, ? lung nodule.   DVT Prophylaxis  :   Heparin    Lab Results  Component Value Date   PLT 291 11/30/2016    Inpatient Medications  Scheduled Meds: . apixaban  5 mg Oral BID  . aspirin  81 mg Oral Daily  . azithromycin  500 mg Intravenous Q24H  . cefTRIAXone (ROCEPHIN)  IV  1 g Intravenous Q24H  . feeding supplement (ENSURE ENLIVE)  237 mL Oral BID BM  . insulin aspart  0-15 Units Subcutaneous TID WC  . insulin aspart  0-5 Units Subcutaneous QHS  . ipratropium-albuterol  3 mL Nebulization TID  . methylPREDNISolone (SOLU-MEDROL) injection  60 mg Intravenous TID  . mometasone-formoterol  2 puff Inhalation BID  . nebivolol  2.5 mg Oral Daily   Continuous Infusions:  PRN Meds:.albuterol, ALPRAZolam, guaiFENesin-dextromethorphan  Antibiotics  :    Anti-infectives    Start     Dose/Rate Route Frequency Ordered Stop   11/27/16 0800  cefTRIAXone (ROCEPHIN) 1 g in dextrose 5 % 50 mL IVPB  Status:  Discontinued     1 g 100 mL/hr over 30 Minutes Intravenous  Once 11/27/16 0157 11/27/16 0201   11/27/16 0800  azithromycin (ZITHROMAX) 500 mg in dextrose 5 % 250 mL IVPB  Status:  Discontinued     500 mg 250 mL/hr over 60 Minutes Intravenous  Once 11/27/16 0157 11/27/16 0201   11/27/16 0800  cefTRIAXone (ROCEPHIN) 1 g in dextrose 5 % 50 mL IVPB     1 g 100 mL/hr over 30 Minutes Intravenous Every 24 hours 11/27/16 0201     11/27/16 0800  azithromycin (ZITHROMAX) 500 mg in dextrose 5 %  250 mL IVPB     500 mg 250 mL/hr over 60 Minutes Intravenous Every 24 hours 11/27/16 0201     11/26/16 2345  vancomycin (VANCOCIN) IVPB 1000 mg/200 mL premix     1,000 mg 200 mL/hr over 60 Minutes Intravenous  Once 11/26/16 2332 11/27/16 0152   11/26/16 2345  piperacillin-tazobactam (ZOSYN) IVPB 3.375 g     3.375 g 100 mL/hr over 30 Minutes Intravenous  Once 11/26/16 2332 11/27/16 0047         Objective:   Vitals:   11/29/16 2054 11/29/16 2127 11/30/16 0436 11/30/16 0549  BP:  (!) 129/54 (!) 125/49   Pulse: 75 75 73   Resp: 19 20 18    Temp:  98.9 F (37.2 C) 98.3 F (36.8 C)   TempSrc:      SpO2: 99% 98% 100% 100%  Weight:      Height:        Wt Readings from Last 3 Encounters:  11/27/16 53.3 kg (117 lb 6.4 oz)  09/25/16 53.3 kg (117 lb 6.4 oz)  05/05/16 53.8 kg (118 lb 9.6 oz)     Intake/Output Summary (Last 24 hours) at 11/30/16 0925 Last data filed at 11/30/16 0238  Gross per 24 hour  Intake              820 ml  Output             1700 ml  Net             -880 ml     Physical Exam  Awake Alert, Oriented X 3, No new F.N deficits, Normal affect [place].AT,PERRAL Supple Neck,No JVD, No cervical lymphadenopathy appriciated.  Symmetrical Chest wall movement, Good air movement bilaterally, Does have expiratory wheezes  RRR,No Gallops,Rubs or new Murmurs, No Parasternal Heave +ve B.Sounds, Abd Soft, No tenderness, No organomegaly appriciated, No rebound - guarding or rigidity. No Cyanosis, Clubbing or edema, No new Rash or bruise      Data Review:    CBC  Recent Labs Lab 11/26/16 2307 11/26/16 2316 11/27/16 0948 11/28/16 0505 11/29/16 1049 11/30/16 0602  WBC 33.0*  --  35.1* 31.1* 22.0* 17.2*  HGB 11.1* 12.2 9.7* 9.1* 10.0* 10.1*  HCT 34.1* 36.0 29.8* 27.7* 31.4* 31.5*  PLT 281  --  230 216 265 291  MCV 87.2  --  86.1 86.8 86.7 86.3  MCH 28.4  --  28.0 28.5 27.6 27.7  MCHC 32.6  --  32.6 32.9 31.8 32.1  RDW 13.3  --  13.1 13.6 13.3 13.2  LYMPHSABS  0.7  --  0.7  --   --   --   MONOABS 2.6*  --  2.1*  --   --   --   EOSABS 0.0  --  0.0  --   --   --   BASOSABS 0.0  --  0.0  --   --   --     Chemistries   Recent Labs Lab 11/26/16 2307 11/26/16 2316 11/27/16 0948 11/28/16 0505 11/29/16 1049 11/30/16 0602  NA 128* 128* 131* 130* 133* 132*  K 4.2 4.1 3.9 4.2 3.3* 5.0  CL 89* 87* 93* 94* 88* 92*  CO2 30  --  29  27 32 32  GLUCOSE 179* 186* 169* 147* 268* 142*  BUN 11 14 10 11 19 20   CREATININE 0.73 0.60 0.58 0.57 0.74 0.63  CALCIUM 9.2  --  8.5* 8.5* 9.0 9.1   ------------------------------------------------------------------------------------------------------------------ No results for input(s): CHOL, HDL, LDLCALC, TRIG, CHOLHDL, LDLDIRECT in the last 72 hours.  Lab Results  Component Value Date   HGBA1C 5.5 11/27/2016   ------------------------------------------------------------------------------------------------------------------ No results for input(s): TSH, T4TOTAL, T3FREE, THYROIDAB in the last 72 hours.  Invalid input(s): FREET3 ------------------------------------------------------------------------------------------------------------------ No results for input(s): VITAMINB12, FOLATE, FERRITIN, TIBC, IRON, RETICCTPCT in the last 72 hours.  Coagulation profile  Recent Labs Lab 11/27/16 0245 11/27/16 0948  INR 0.99 1.10    No results for input(s): DDIMER in the last 72 hours.  Cardiac Enzymes  Recent Labs Lab 11/27/16 0245 11/27/16 0948 11/27/16 1305  TROPONINI 1.19* 1.13* 1.28*   ------------------------------------------------------------------------------------------------------------------    Component Value Date/Time   BNP 55.7 11/26/2016 2307    Micro Results Recent Results (from the past 240 hour(s))  Blood culture (routine x 2)     Status: None (Preliminary result)   Collection Time: 11/26/16 11:15 PM  Result Value Ref Range Status   Specimen Description BLOOD RIGHT ARM  Final    Special Requests IN PEDIATRIC BOTTLE  Final   Culture NO GROWTH 2 DAYS  Final   Report Status PENDING  Incomplete  Blood culture (routine x 2)     Status: None (Preliminary result)   Collection Time: 11/26/16 11:50 PM  Result Value Ref Range Status   Specimen Description BLOOD RIGHT HAND  Final   Special Requests IN PEDIATRIC BOTTLE  Final   Culture NO GROWTH 2 DAYS  Final   Report Status PENDING  Incomplete  Respiratory Panel by PCR     Status: None   Collection Time: 11/27/16  2:46 AM  Result Value Ref Range Status   Adenovirus NOT DETECTED NOT DETECTED Final   Coronavirus 229E NOT DETECTED NOT DETECTED Final   Coronavirus HKU1 NOT DETECTED NOT DETECTED Final   Coronavirus NL63 NOT DETECTED NOT DETECTED Final   Coronavirus OC43 NOT DETECTED NOT DETECTED Final   Metapneumovirus NOT DETECTED NOT DETECTED Final   Rhinovirus / Enterovirus NOT DETECTED NOT DETECTED Final   Influenza A NOT DETECTED NOT DETECTED Final   Influenza B NOT DETECTED NOT DETECTED Final   Parainfluenza Virus 1 NOT DETECTED NOT DETECTED Final   Parainfluenza Virus 2 NOT DETECTED NOT DETECTED Final   Parainfluenza Virus 3 NOT DETECTED NOT DETECTED Final   Parainfluenza Virus 4 NOT DETECTED NOT DETECTED Final   Respiratory Syncytial Virus NOT DETECTED NOT DETECTED Final   Bordetella pertussis NOT DETECTED NOT DETECTED Final   Chlamydophila pneumoniae NOT DETECTED NOT DETECTED Final   Mycoplasma pneumoniae NOT DETECTED NOT DETECTED Final    Radiology Reports Ct Angio Chest Pe W Or Wo Contrast  Result Date: 11/27/2016 CLINICAL DATA:  Shortness of Breath EXAM: CT ANGIOGRAPHY CHEST WITH CONTRAST TECHNIQUE: Multidetector CT imaging of the chest was performed using the standard protocol during bolus administration of intravenous contrast. Multiplanar CT image reconstructions and MIPs were obtained to evaluate the vascular anatomy. CONTRAST:  65 mL Isovue 370. COMPARISON:  11/27/2015 FINDINGS: Cardiovascular:  Thoracic aorta demonstrates some calcifications without aneurysmal dilatation or dissection. No significant cardiac enlargement is noted. Mild coronary calcifications are seen. The pulmonary artery shows a normal branching pattern. No filling defect to suggest pulmonary embolism is seen. Mediastinum/Nodes: The thoracic inlet is within  normal limits. Scattered small hilar lymph nodes are seen. No significant mediastinal adenopathy is noted. The esophagus as visualized is within normal limits. Lungs/Pleura: Diffuse emphysematous changes are noted. In the left upper lobe there is an 8 mm somewhat lobular nodular density with surrounding increased infiltrate. Bilateral lower lobe infiltrates are noted right greater than left. Some associated retained secretions are noted within the bronchial tree bilaterally. No other focal nodular density is seen. Upper Abdomen: Visualized upper abdomen is within normal limits. Musculoskeletal: No acute bony abnormality is noted. Review of the MIP images confirms the above findings. IMPRESSION: No evidence of pulmonary embolism. Bilateral lower lobe infiltrates right greater than left without sizable effusion. Lobular nodular density in the left upper lobe with some surrounding infiltrate. This may all be postinflammatory in nature. Non-contrast chest CT at 6-12 months is recommended. If the nodule is stable at time of repeat CT, then future CT at 18-24 months (from today's scan) is considered optional for low-risk patients, but is recommended for high-risk patients. This recommendation follows the consensus statement: Guidelines for Management of Incidental Pulmonary Nodules Detected on CT Images: From the Fleischner Society 2017; Radiology 2017; 284:228-243. Electronically Signed   By: Alcide Clever M.D.   On: 11/27/2016 11:07   Dg Chest Port 1 View  Result Date: 11/27/2016 CLINICAL DATA:  Shortness of breath. EXAM: PORTABLE CHEST 1 VIEW COMPARISON:  11/26/2016 FINDINGS: Lungs are  hyperinflated. Heart size is normal. There is patchy infiltrate involving the medial right lower lobe. Developing infiltrate also noted in the medial left lower lobe. No pulmonary edema. Remote left rib fracture. IMPRESSION: Developing bilateral lower lobe infiltrates. Electronically Signed   By: Norva Pavlov M.D.   On: 11/27/2016 07:32   Dg Chest Portable 1 View  Result Date: 11/26/2016 CLINICAL DATA:  Shortness of breath, history of COPD EXAM: PORTABLE CHEST 1 VIEW COMPARISON:  09/11/2016 FINDINGS: Hyperinflation. Focal opacity at the right lung base with possible tiny right effusion. Normal cardiomediastinal silhouette with atherosclerosis. No pneumothorax. IMPRESSION: Hyperinflation with right basilar opacity suspicious for an infiltrate. Possible tiny right pleural effusion. Electronically Signed   By: Jasmine Pang M.D.   On: 11/26/2016 22:40    Time Spent in minutes  30   Ezreal Turay K M.D on 11/30/2016 at 9:25 AM  Between 7am to 7pm - Pager - [phone]  After 7pm go to [email or web address] - password [facility]  [facility] -  Office  [phone]

## 2016-11-30 NOTE — Progress Notes (Signed)
Name: [name] MRN: [id number] DOB: 10/20/1945    ADMISSION DATE:  11/26/2016 CONSULTATION DATE: 11/27/2016  REFERRING MD :  Dr. Thedore MinsSingh   CHIEF COMPLAINT:  Dyspnea   BRIEF PATIENT DESCRIPTION:  71 year old female with PMH of COPD mixed type/Gold D (former smoker), (on 2-3L [place] at baseline, followed by Fannie Kneelint Young), HTN, and glaucoma. Presents to ED on 3/8 with progressive dyspnea, patient reports that she was cleaning her house when this suddenly onset. At baseline she reports that she will get short of breath, however, is able to recover after a few minutes. Upon arrival to ED patient had a temp of 101, WBC 33, and required use of BIPAP. PCCM was consulted to assist with continued hypoxia.   Of note patient had COPD exacerbation on 1/5 where she was changed to Trelegy Inhaler > patient reports that she would like to go back on Symbicort as she feels like her symptoms were better controlled then.  SIGNIFICANT EVENTS  3/8 > Presents to ED with Dyspnea   STUDIES:  CXR 3/8 > Hyperinflation, focal opacity at the right lung base with possible right effusion CXR 3/9 > Developing bilateral lower lobe infiltrates  CTA Chest 3/9 > Neg PE, Lobular nodule density in the left upper lobe with surrounding infiltrate  MICROBIOLOGY: Blood 3/8 >> Sputum 3/8 >>    SUBJECTIVE:  Remains on home oxygen level. States she gets severely short of breath while moving around. However, feels like a major factor to this is her anxiety level.   VITAL SIGNS: Temp:  [97.8 F (36.6 C)-98.9 F (37.2 C)] 98.3 F (36.8 C) (03/12 0436) Pulse Rate:  [73-75] 73 (03/12 0436) Resp:  [18-20] 18 (03/12 0436) BP: (125-132)/(49-54) 125/49 (03/12 0436) SpO2:  [95 %-100 %] 100 % (03/12 0549)  PHYSICAL EXAMINATION: General:  Elderly female, no distress, sitting in bed  Neuro:  Alert, oriented, follows commands, grossly intact  HEENT:  Normocephalic  Cardiovascular:  RRR, no MRG, NI S1/S2  Lungs:  Crackles noted  to bases, non-labored  Abdomen:  Non-tender, active bowel sounds Musculoskeletal:  No acute Skin:  Warm, dry, intact    Recent Labs Lab 11/28/16 0505 11/29/16 1049 11/30/16 0602  NA 130* 133* 132*  K 4.2 3.3* 5.0  CL 94* 88* 92*  CO2 27 32 32  BUN 11 19 20   CREATININE 0.57 0.74 0.63  GLUCOSE 147* 268* 142*    Recent Labs Lab 11/28/16 0505 11/29/16 1049 11/30/16 0602  HGB 9.1* 10.0* 10.1*  HCT 27.7* 31.4* 31.5*  WBC 31.1* 22.0* 17.2*  PLT 216 265 291   Dg Chest 2 View  Result Date: 11/30/2016 CLINICAL DATA:  PNA; SOB today worsening after exertion; hx HTN, ex-smoker EXAM: CHEST  2 VIEW COMPARISON:  CT and plain films of 11/28/2015. FINDINGS: Hyperinflation. Midline trachea. Mild cardiomegaly. Atherosclerosis in the transverse aorta. Small bilateral pleural effusions. No pneumothorax. Minimal improvement in right greater than left base airspace disease. No new pulmonary opacity. IMPRESSION: Slight improvement in right greater than left base airspace disease. Cardiomegaly without congestive failure. Small bilateral pleural effusions. Electronically Signed   By: Jeronimo GreavesKyle  Talbot M.D.   On: 11/30/2016 09:39    ASSESSMENT / PLAN:  Acute on Chronic Hypoxic/hypercapnic Respiratory Failure secondary to AECOPD, +CAP, +Anxiety  Plan -Maintain Oxygenation >90 -Continue Duoneb and Dulera  -Transition Solumedrol to PO prednisone Taper  -[place] Antibiotics  > D/C Azithromycin Treat with Rocephin if changed to PO Augmentin  -Pulmonary Hygiene > Mobilize, IS,  Flutter valve  -Continue PRN Xanax   Jovita Kussmaul, AG-ACNP [facility]  Pgr: [phone]  PCCM Pgr: [phone]   STAFF NOTE: Cindi Carbon, MD FACP have personally reviewed patient's available data, including medical history, events of note, physical examination and test results as part of my evaluation. I have discussed with resident/NP and other care providers such as pharmacist, RN and RRT.  In addition, I personally evaluated patient and elicited key findings of: awake in chair, anxious overall, lungs coarse anterior, jvd wnl, no edema legs, PNA was present on scan , pcxr and last pcxr is improved, wbc is downward, she is progressing well, allow neg balance, given appearanc einfiltrate, we should consider monotherapy ctx, dc azithro, I updated pt in full, will sign off, call if needed, needs 7 days abx   Mcarthur Rossetti. Tyson Alias, MD, FACP Pgr: [phone] [facility] 11/30/2016 4:12 PM

## 2016-11-30 NOTE — Progress Notes (Signed)
Progress Note  Patient Name: [name] Date of Encounter: 11/30/2016  Primary Cardiologist: new  Subjective   Patient is currently on empiric abx, IV steroids, nebulizer treatments and supportive care for sepsis in the setting of acute on chronic hypoxic respiratory failure secondary to pneumonia with COPD.   At home gets intermittent CP at rest, last a second at a time- sometimes on the right side and sometimes left. Denies being told she has an abnormal heart rhythm in the past. Pt still has severe exertional dyspnea and some mild dyspnea at rest.She was on a heparin drip and has been transitioned to Eliquis over the weekend.  Recommended by cardiology that a nuc stress test be done with pulmonary issues improve and also to strongly consider starting on chronic anticoags because the tele monitor has picked up intermittent a.fib, appears to have more episodes of afib on Tele since yesterday.  Patient is unhappy, requesting Xanax, very anxious. She wants to go home and doesn't want to go to a rehab facility which medicine is recommending. She doesn't want to take blood thinners because a. Eliquis is expensive and b. Coumadin she would need to get her levels checked. Denies falling or GI bleed.   Inpatient Medications    Scheduled Meds: . apixaban  5 mg Oral BID  . aspirin  81 mg Oral Daily  . azithromycin  500 mg Intravenous Q24H  . cefTRIAXone (ROCEPHIN)  IV  1 g Intravenous Q24H  . feeding supplement (ENSURE ENLIVE)  237 mL Oral BID BM  . insulin aspart  0-15 Units Subcutaneous TID WC  . insulin aspart  0-5 Units Subcutaneous QHS  . ipratropium-albuterol  3 mL Nebulization TID  . methylPREDNISolone (SOLU-MEDROL) injection  60 mg Intravenous TID  . mometasone-formoterol  2 puff Inhalation BID  . nebivolol  2.5 mg Oral Daily   Continuous Infusions:  PRN Meds: albuterol, ALPRAZolam, guaiFENesin-dextromethorphan   Vital Signs    Vitals:   11/29/16 2054 11/29/16 2127  11/30/16 0436 11/30/16 0549  BP:  (!) 129/54 (!) 125/49   Pulse: 75 75 73   Resp: 19 20 18    Temp:  98.9 F (37.2 C) 98.3 F (36.8 C)   TempSrc:      SpO2: 99% 98% 100% 100%  Weight:      Height:        Intake/Output Summary (Last 24 hours) at 11/30/16 0921 Last data filed at 11/30/16 0238  Gross per 24 hour  Intake              820 ml  Output             1700 ml  Net             -880 ml   Filed Weights   11/26/16 2138 11/27/16 0149  Weight: 117 lb (53.1 kg) 117 lb 6.4 oz (53.3 kg)    Telemetry    ?? Normal sinus rhythm HR 81, intermittent episodes of rate controlled a.fib. - Personally Reviewed  ECG    None since 11/27/2016 - Personally Reviewed  Physical Exam   GEN: No acute distress.  Deconditioned. Neck: No JVD Cardiac: RRR, no murmurs, rubs, or gallops.  Respiratory: crackles to bilateral lungs, increased effort of breathing , congested cough, severe dyspnea on exertion. GI: Soft, nontender, non-distended  MS: No edema; No deformity. Neuro:  Nonfocal  Psych: Normal affect   Labs    Chemistry Recent Labs Lab 11/28/16 0505 11/29/16 1049 11/30/16 0602  NA 130* 133* 132*  K 4.2 3.3* 5.0  CL 94* 88* 92*  CO2 27 32 32  GLUCOSE 147* 268* 142*  BUN 11 19 20   CREATININE 0.57 0.74 0.63  CALCIUM 8.5* 9.0 9.1  GFRNONAA >60 >60 >60  GFRAA >60 >60 >60  ANIONGAP 9 13 8      Hematology Recent Labs Lab 11/28/16 0505 11/29/16 1049 11/30/16 0602  WBC 31.1* 22.0* 17.2*  RBC 3.19* 3.62* 3.65*  HGB 9.1* 10.0* 10.1*  HCT 27.7* 31.4* 31.5*  MCV 86.8 86.7 86.3  MCH 28.5 27.6 27.7  MCHC 32.9 31.8 32.1  RDW 13.6 13.3 13.2  PLT 216 265 291    Cardiac Enzymes Recent Labs Lab 11/27/16 0245 11/27/16 0948 11/27/16 1305  TROPONINI 1.19* 1.13* 1.28*    Recent Labs Lab 11/26/16 2315  TROPIPOC 0.89*     BNP Recent Labs Lab 11/26/16 2307  BNP 55.7      Radiology    Chest xray from today pending.  Cardiac Studies   2D Echo 11/27/2016  Study  Conclusions  - Left ventricle: The cavity size was normal. Systolic function was   normal. The estimated ejection fraction was in the range of 55%   to 60%. Wall motion was normal; there were no regional wall   motion abnormalities. Left ventricular diastolic function   parameters were normal. - Aortic valve: Trileaflet; mildly thickened, mildly calcified   leaflets. Valve mobility was restricted. There was very mild   stenosis. There was moderate regurgitation. - Mitral valve: Transvalvular velocity was within the normal range.   There was no evidence for stenosis. There was no regurgitation. - Right ventricle: The cavity size was normal. Wall thickness was   normal. Systolic function was normal. - Atrial septum: No defect or patent foramen ovale was identified   by color flow Doppler. - Tricuspid valve: There was no regurgitation.   Patient Profile     71 y.o. female ELAYAH KLOOSTER is a 71 y.o. female with a history of previous heavy tobacco abuse (quit 2016), COPD on 2.5L home O2, HTN, and macular degeneration who presented to [facility] on 11/27/16 with SOB and fevers. She was found to have sepsis 2/2 PNA and AECOPD. She was admitted to [facility] in 11/2015 for AECOPD. 2D ECHO at that time showed normal LV function, G1DD, mild MR/TR.   Assessment & Plan    Elevated troponin: 0.89 <<  1.19 >> 1.13, last trend from 3 days ago. Has been transitioned to Eliquis over the weekend. She denies any chest pain today. CTA did show some mild calcifications of aorta and coronary arteries. 2D echo this admission showed normal VLF with moderate AR and mild AS Troponins higher than expected for demand ischemia therefore recommendation is to have nuclear stress test be done prior to discharge once pulmonary issues have resolved.   Possible afib/flutter on tele: Continues to have intermittent a.fib since yesterday.  CHADS2VASC score is 4 (HTN, age, vasc dz) so likely will need to be placed on long term  anticoagulation once ischemic workup complete. Pts preference is to not be on blood thinners; cost/time. - Continue Eliquis  HTN: Well controlled on current regimen 24 hour;  Min 125/49  andd  Max 132/54  Sepsis &Acute on chronic hypoxic respiratory failure secondary to pneumonia with COPD exacerbation in a patient with advanced COPD onto a half to 3 L nasal Oxygen at Home: Internal Medicine managing  Possible pulmonary nodule: this will be followed by her pulmonologist, Dr.  Jenkins   Acute on chronic leukocytosis: Internal Medicine Managing    Signed, Dorthula MatasGREENE,Brylie Sneath G, PA-C  11/30/2016, 9:21 AM

## 2016-12-01 ENCOUNTER — Inpatient Hospital Stay (HOSPITAL_COMMUNITY): Payer: Medicare HMO

## 2016-12-01 DIAGNOSIS — I214 Non-ST elevation (NSTEMI) myocardial infarction: Secondary | ICD-10-CM

## 2016-12-01 LAB — CBC
HCT: 28.8 % — ABNORMAL LOW (ref 36.0–46.0)
HEMOGLOBIN: 9.3 g/dL — AB (ref 12.0–15.0)
MCH: 27.9 pg (ref 26.0–34.0)
MCHC: 32.3 g/dL (ref 30.0–36.0)
MCV: 86.5 fL (ref 78.0–100.0)
Platelets: 303 10*3/uL (ref 150–400)
RBC: 3.33 MIL/uL — ABNORMAL LOW (ref 3.87–5.11)
RDW: 13.2 % (ref 11.5–15.5)
WBC: 17.9 10*3/uL — AB (ref 4.0–10.5)

## 2016-12-01 LAB — NM MYOCAR MULTI W/SPECT W/WALL MOTION / EF
CHL CUP NUCLEAR SDS: 4
CHL CUP NUCLEAR SRS: 0
CHL CUP NUCLEAR SSS: 4
CSEPEW: 1 METS
Exercise duration (min): 5 min
Exercise duration (sec): 19 s
LHR: 0
LVDIAVOL: 73 mL (ref 46–106)
LVSYSVOL: 26 mL
Peak HR: 107 {beats}/min
Rest HR: 77 {beats}/min
TID: 1.24

## 2016-12-01 LAB — GLUCOSE, CAPILLARY
GLUCOSE-CAPILLARY: 96 mg/dL (ref 65–99)
Glucose-Capillary: 109 mg/dL — ABNORMAL HIGH (ref 65–99)
Glucose-Capillary: 188 mg/dL — ABNORMAL HIGH (ref 65–99)

## 2016-12-01 MED ORDER — REGADENOSON 0.4 MG/5ML IV SOLN
0.4000 mg | Freq: Once | INTRAVENOUS | Status: AC
Start: 2016-12-01 — End: 2016-12-01
  Administered 2016-12-01: 0.4 mg via INTRAVENOUS
  Filled 2016-12-01: qty 5

## 2016-12-01 MED ORDER — PREDNISONE 20 MG PO TABS: 30.0000 mg | ORAL_TABLET | Freq: Every day | ORAL | Status: DC

## 2016-12-01 MED ORDER — TECHNETIUM TC 99M TETROFOSMIN IV KIT
10.0000 | PACK | Freq: Once | INTRAVENOUS | Status: AC | PRN
  Administered 2016-12-01: 10 via INTRAVENOUS

## 2016-12-01 MED ORDER — PAROXETINE HCL 20 MG PO TABS
10.0000 mg | ORAL_TABLET | Freq: Every day | ORAL | Status: DC
  Administered 2016-12-01 – 2016-12-03 (×3): 10 mg via ORAL
  Filled 2016-12-01 (×3): qty 1

## 2016-12-01 MED ORDER — PREDNISONE 20 MG PO TABS: 20.0000 mg | ORAL_TABLET | Freq: Every day | ORAL | Status: DC

## 2016-12-01 MED ORDER — TECHNETIUM TC 99M TETROFOSMIN IV KIT
30.0000 | PACK | Freq: Once | INTRAVENOUS | Status: AC | PRN
  Administered 2016-12-01: 30 via INTRAVENOUS

## 2016-12-01 MED ORDER — PREDNISONE 20 MG PO TABS
30.0000 mg | ORAL_TABLET | Freq: Every day | ORAL | Status: DC
  Administered 2016-12-01 – 2016-12-02 (×2): 30 mg via ORAL
  Filled 2016-12-01 (×2): qty 1

## 2016-12-01 MED ORDER — REGADENOSON 0.4 MG/5ML IV SOLN
INTRAVENOUS | Status: AC
  Administered 2016-12-01: 0.4 mg via INTRAVENOUS
  Filled 2016-12-01: qty 5

## 2016-12-01 MED ORDER — AZITHROMYCIN 500 MG PO TABS
500.0000 mg | ORAL_TABLET | Freq: Once | ORAL | Status: AC
  Administered 2016-12-01: 500 mg via ORAL
  Filled 2016-12-01: qty 1

## 2016-12-01 NOTE — Progress Notes (Signed)
Occupational Therapy Treatment Patient Details Name: Kaitlyn MoralesFrances L Gumz MRN: [id number] DOB: 1946-03-01 Today's Date: 12/01/2016    History of present illness 71 y.o. female with medical history significant of glaucoma, macular degeneration, COPD on 2.5 to 3.0 L home O2, h/o 2 prior intubations, and HTN presenting Because of acute on chronic hypoxic respiratory failure. chest x-ray and CT scan suggestive pneumonia. She also had some demand ischemia with elevation of troponin.   OT comments  Pt progressing toward OT goals. She continues to demonstrate decreased activity tolerance for ADL requiring therapeutic rest breaks after approximately 1 minute of activity. She was able to complete toilet transfers with min guard assist and standing ADL tasks with supervision this session. Educated pt concerning energy conservation techniques during ADL tasks with handout provided. She would benefit from continued reinforcement.  In consideration of d/c plan, pt reports hesitance concerning potential D/C to SNF. Feel she would benefit from 24 hour supervision/assistance from family post-acute D/C however they are unable to provide this. Feel she would benefit from short-term SNF placement for continued rehabilitation services in order to maximize independence with ADL and functional mobility if she is agreeable. If not, will need home health OT services.   Follow Up Recommendations  Home health OT;Supervision/Assistance - 24 hour (see above for details)   Equipment Recommendations  3 in 1 bedside commode    Recommendations for Other Services      Precautions / Restrictions Precautions Precautions: Fall Restrictions Weight Bearing Restrictions: No       Mobility Bed Mobility               General bed mobility comments: Sitting at EOB on arrival.  Transfers Overall transfer level: Needs assistance Equipment used: None Transfers: Sit to/from Stand Sit to Stand: Supervision          General transfer comment: Supervision for safety.    Balance Overall balance assessment: Needs assistance Sitting-balance support: No upper extremity supported;Feet supported Sitting balance-Leahy Scale: Normal     Standing balance support: No upper extremity supported Standing balance-Leahy Scale: Good                     ADL Overall ADL's : Needs assistance/impaired     Grooming: Supervision/safety;Standing                   Toilet Transfer: Min guard;BSC;Ambulation           Functional mobility during ADLs: Min guard General ADL Comments: Challenged activity tolerance with ADL tasks standing in room. Pt required therapeutic rest breaks after approximately 1 minute of activity. Vitals stable throughout; SpO2 ranging from 95-98% with 3 L O2 and HR ranging from 88-90 with activity.      Vision                     Perception     Praxis      Cognition   Behavior During Therapy: Anxious;WFL for tasks assessed/performed Overall Cognitive Status: Within Functional Limits for tasks assessed                         Exercises     Shoulder Instructions       General Comments      Pertinent Vitals/ Pain       Pain Assessment: Faces Faces Pain Scale: Hurts little more Pain Location: unspecified Pain Descriptors / Indicators: Discomfort Pain Intervention(s): Monitored during session  Home  Living                                          Prior Functioning/Environment              Frequency  Min 2X/week        Progress Toward Goals  OT Goals(current goals can now be found in the care plan section)  Progress towards OT goals: Progressing toward goals  Acute Rehab OT Goals Patient Stated Goal: figure out if she will go to SNF or home OT Goal Formulation: With patient Time For Goal Achievement: 12/11/16 Potential to Achieve Goals: Good ADL Goals Pt Will Perform Grooming: with modified  independence;sitting Pt Will Perform Upper Body Bathing: with modified independence;sitting Pt Will Perform Lower Body Bathing: with modified independence;sit to/from stand Pt Will Transfer to Toilet: with modified independence;ambulating Pt Will Perform Toileting - Clothing Manipulation and hygiene: with modified independence;sit to/from stand Additional ADL Goal #1: Pt will independently verbalize 2 energy conservation techniques to incoporate into ADLs.   Plan Discharge plan remains appropriate    Co-evaluation                 End of Session Equipment Utilized During Treatment: Oxygen (3LO2)  OT Visit Diagnosis: Unsteadiness on feet (R26.81);Other (comment)   Activity Tolerance Other (comment) (limited by decreased activity tolerance)   Patient Left in bed;with call bell/phone within reach;with family/visitor present (with respiratory therapist providing breathing treatment)   Nurse Communication          Time: 1610-9604 OT Time Calculation (min): 18 min  Charges: OT General Charges $OT Visit: 1 Procedure OT Treatments $Therapeutic Activity: 8-22 mins  Doristine Section, MS OTR/L  Pager: [phone]    Emerald Shor A Nastasia Kage 12/01/2016, 4:36 PM

## 2016-12-01 NOTE — Progress Notes (Signed)
PT Cancellation Note  Patient Details Name: Kaitlyn MoralesFrances L Macnair MRN: [id number] DOB: [birth year]   Cancelled Treatment:    Reason Eval/Treat Not Completed: Patient at procedure or test/unavailable (Pt off unit for repeat of stress test.  Will f/u if time permits.  )   Michele Kerlin Artis DelayJ Daschel Roughton 12/01/2016, 4:16 PM Joycelyn RuaAimee Gurvir Schrom, PTA pager [phone]

## 2016-12-01 NOTE — Care Management Note (Signed)
Case Management Note  Patient Details  Name: [name] MRN: [id number] Date of Birth: [birth year]  Subjective/Objective:         Admitted with PNA, history of glaucoma, macular degeneration, COPD on 2.5 to 3.0 L home O2, h/o 2 prior intubation. From home alone. Pt very anxious, states she will needs to go to SNF/ rehab @  discharge. States she has no one to assist her once d/c. Pt states she has a son and daughter and they are both ill.   Trinda Pascalrenda Axsom (Daughter) Rolland PorterDwayne Brimage (Son)     [phone]       PCP: Antony HasteMichael Badger   Action/Plan: SNF vs home with home health services.....PT evaluation pending, CM to follow up with disposition needs.  Expected Discharge Date:                  Expected Discharge Plan:  Home w Home Health Services (Lives alone)  In-House Referral:  Clinical Social Work  Discharge planning Services  CM Consult  Post Acute Care Choice:  Resumption of Svcs/PTA Provider Choice offered to:     DME Arranged:  Oxygen DME Agency:     HH Arranged:    HH Agency:     Status of Service:  In process, will continue to follow  If discussed at Long Length of Stay Meetings, dates discussed:    Additional Comments:  Epifanio LeschesCole, Marranda Arakelian Hudson, RN 12/01/2016, 6:03 PM

## 2016-12-01 NOTE — Progress Notes (Addendum)
PROGRESS NOTE                                                                                                                                                                                                             Patient Demographics:    [name], is a 71 y.o. female, DOB - 10-22-1945, ZOX:[id number]  Admit date - 11/26/2016   Admitting Physician Jonah Blue, MD  Outpatient Primary MD for the patient is [name], MD  LOS - 4  Chief Complaint  Patient presents with  . COPD       Brief Narrative   - [name] is a 70 y.o. female with medical history significant of glaucoma, macular degeneration, COPD on 2.5 to 3.0 L home O2, h/o 2 prior intubations, and HTN presenting Because of acute on chronic hypoxic respiratory failure after doing some cleaning and being exposed to certain fumes at home. She also had a temp of 101 at home. He was admitted to the hospital where a chest x-ray and CT scan suggestive pneumonia. She also had some demand ischemia with elevation of troponin.  She is being treated for pneumonia, COPD exacerbation, A. fib, pulmonary critical care cardiology has seen the patient. Undergoing stress test on 12/01/2016. Pulmonary issues much improved.   Subjective:    Tyronica Nickels today has, No headache, No chest pain, No abdominal pain - No Nausea, No new weakness tingling or numbness, No Cough - improved SOB.   Note patient tends to talk for over 25-30 minutes on a daily basis on various non-medicine related topics. She says that she is lonely and she has no one to talk to at home as her son is usually busy at work and out of town, she has few friends, she tried to become friends with a woman at the [facility] but could not keep up, she is constantly bored, on 11/30/2016 she started talking about her pets and how no one becomes a friend. I had to politely excuse myself after 25  minutes.  Discussed her social situation with her daughter-in-law and 12/01/2016, per daughter-in-law she has problems with talking nonstop especially when she takes prednisone and daughter-in-law tells me that she usually will turn her whole life story to whoever she finds.    Assessment  & Plan :     1. Sepsis &  Acute on chronic hypoxic respiratory failure secondary to pneumonia with COPD exacerbation in a patient with advanced COPD onto a half to 3 L nasal Oxygen at Home.   She Initially Required BiPAP, Treated with empiric antibiotics, last dose of antibiotics on 12/01/2016, is now on PO Steroids which I have started to taper, Nebulizer Treatments and Supportive Care, Clinically improved. In the past She Has Required 2 Intubations, pulmonary on board as well. We will continue to monitor closely. Sepsis physiology seems to have resolved for now, She is clinically much improved and we will likely discharge her on 12/02/2016.   2. COPD exacerbation. Treatment as in #1 above along with steroids. Clinically improved. At baseline she uses between 2-1/2-3-1/2 nasal cannula oxygen continue. Follow with primary pulmonologist Dr. Lovell Sheehan after discharge.  3. Mild troponin elevation due to demand ischemia from #1 and 2 above. Stable echocardiogram with preserved EF of 55% and no wall motion abnormality, cardiology following on aspirin and low-dose beta blocker along with Eliquis, EKG nonacute, currently chest pain-free, on aspirin.  Cardiology doing Lexiscan stress test on 12/01/2016.  4. Paroxysmal atrial fibrillation noted on telemetry. Per cardiology continue beta blocker and anticoagulation. We will transition to Eliquis from heparin drip on 11/28/2016.  5. Hypertension. For now low-dose Bystolic and monitor.  6. Anemia of chronic disease. No acute issues.  7. Acute on chronic leukocytosis. Some worsening due to steroids and pneumonia. She has had some chronic leukocytosis as well, will have  outpatient follow-up with hematology post discharge.  8. ? Lung nodule versus infection. Follow with primary pulmonologist Dr. Lovell Sheehan after discharge.  9. Severe anxiety and pressure of speech. April down steroids, placed on Paxil.    Diet : Diet Carb Modified Fluid consistency: Thin; Room service appropriate? Yes   Family Communication  :  Daughter in law on 12-01-16  Code Status : Full  Disposition Plan  :  Likely DC in am with HHPT vs SNF, she is tearful this morning wants one to 2 days to decide whether she wants to go. Discussed with daughter-in-law discharge will be on 12/02/2016 provided stress test is negative.  Consults  :  Cards, Pulmonary  Procedures  :    Lexiscan  TTE - - Left ventricle: The cavity size was normal. Systolic function was normal. The estimated ejection fraction was in the range of 55% to 60%. Wall motion was normal; there were no regional wall motion abnormalities. Left ventricular diastolic function parameters were normal. - Aortic valve: Trileaflet; mildly thickened, mildly calcified leaflets. Valve mobility was restricted. There was very mild stenosis. There was moderate regurgitation. - Mitral valve: Transvalvular velocity was within the normal range. There was no evidence for stenosis. There was no regurgitation. - Right ventricle: The cavity size was normal. Wall thickness was normal. Systolic function was normal. - Atrial septum: No defect or patent foramen ovale was identified by color flow Doppler. - Tricuspid valve: There was no regurgitation.  CTA - no PE, pneumonia, ? lung nodule.   DVT Prophylaxis  :   Heparin    Lab Results  Component Value Date   PLT 303 12/01/2016    Inpatient Medications  Scheduled Meds: . apixaban  5 mg Oral BID  . aspirin  81 mg Oral Daily  . azithromycin  500 mg Oral Once  . feeding supplement (ENSURE ENLIVE)  237 mL Oral BID BM  . insulin aspart  0-15 Units Subcutaneous TID WC  . insulin aspart  0-5 Units  Subcutaneous QHS  .  ipratropium-albuterol  3 mL Nebulization TID  . mometasone-formoterol  2 puff Inhalation BID  . nebivolol  2.5 mg Oral Daily  . PARoxetine  10 mg Oral Daily  . predniSONE  30 mg Oral Q breakfast   Continuous Infusions:  PRN Meds:.albuterol, ALPRAZolam, guaiFENesin-dextromethorphan  Antibiotics  :    Anti-infectives    Start     Dose/Rate Route Frequency Ordered Stop   12/01/16 1300  azithromycin (ZITHROMAX) tablet 500 mg     500 mg Oral  Once 12/01/16 1206     11/27/16 0800  cefTRIAXone (ROCEPHIN) 1 g in dextrose 5 % 50 mL IVPB  Status:  Discontinued     1 g 100 mL/hr over 30 Minutes Intravenous  Once 11/27/16 0157 11/27/16 0201   11/27/16 0800  azithromycin (ZITHROMAX) 500 mg in dextrose 5 % 250 mL IVPB  Status:  Discontinued     500 mg 250 mL/hr over 60 Minutes Intravenous  Once 11/27/16 0157 11/27/16 0201   11/27/16 0800  cefTRIAXone (ROCEPHIN) 1 g in dextrose 5 % 50 mL IVPB  Status:  Discontinued     1 g 100 mL/hr over 30 Minutes Intravenous Every 24 hours 11/27/16 0201 12/01/16 1206   11/27/16 0800  azithromycin (ZITHROMAX) 500 mg in dextrose 5 % 250 mL IVPB  Status:  Discontinued     500 mg 250 mL/hr over 60 Minutes Intravenous Every 24 hours 11/27/16 0201 11/30/16 1218   11/26/16 2345  vancomycin (VANCOCIN) IVPB 1000 mg/200 mL premix     1,000 mg 200 mL/hr over 60 Minutes Intravenous  Once 11/26/16 2332 11/27/16 0152   11/26/16 2345  piperacillin-tazobactam (ZOSYN) IVPB 3.375 g     3.375 g 100 mL/hr over 30 Minutes Intravenous  Once 11/26/16 2332 11/27/16 0047         Objective:   Vitals:   12/01/16 0917 12/01/16 0950 12/01/16 0952 12/01/16 0954  BP: 122/60 140/64 130/63 130/61  Pulse: 78 92 91 92  Resp: 18     Temp:      TempSrc:      SpO2:      Weight:      Height:        Wt Readings from Last 3 Encounters:  11/27/16 53.3 kg (117 lb 6.4 oz)  09/25/16 53.3 kg (117 lb 6.4 oz)  05/05/16 53.8 kg (118 lb 9.6 oz)     Intake/Output  Summary (Last 24 hours) at 12/01/16 1206 Last data filed at 12/01/16 0919  Gross per 24 hour  Intake              360 ml  Output             1900 ml  Net            -1540 ml     Physical Exam  Awake Alert, Oriented X 3, No new F.N deficits, anxious affect [place].AT,PERRAL Supple Neck,No JVD, No cervical lymphadenopathy appriciated.  Symmetrical Chest wall movement, Good air movement bilaterally, Does have expiratory wheezes  RRR,No Gallops,Rubs or new Murmurs, No Parasternal Heave +ve B.Sounds, Abd Soft, No tenderness, No organomegaly appriciated, No rebound - guarding or rigidity. No Cyanosis, Clubbing or edema, No new Rash or bruise      Data Review:    CBC  Recent Labs Lab 11/26/16 2307  11/27/16 0948 11/28/16 0505 11/29/16 1049 11/30/16 0602 12/01/16 0358  WBC 33.0*  --  35.1* 31.1* 22.0* 17.2* 17.9*  HGB 11.1*  < > 9.7* 9.1* 10.0*  10.1* 9.3*  HCT 34.1*  < > 29.8* 27.7* 31.4* 31.5* 28.8*  PLT 281  --  230 216 265 291 303  MCV 87.2  --  86.1 86.8 86.7 86.3 86.5  MCH 28.4  --  28.0 28.5 27.6 27.7 27.9  MCHC 32.6  --  32.6 32.9 31.8 32.1 32.3  RDW 13.3  --  13.1 13.6 13.3 13.2 13.2  LYMPHSABS 0.7  --  0.7  --   --   --   --   MONOABS 2.6*  --  2.1*  --   --   --   --   EOSABS 0.0  --  0.0  --   --   --   --   BASOSABS 0.0  --  0.0  --   --   --   --   < > = values in this interval not displayed.  Chemistries   Recent Labs Lab 11/26/16 2307 11/26/16 2316 11/27/16 0948 11/28/16 0505 11/29/16 1049 11/30/16 0602  NA 128* 128* 131* 130* 133* 132*  K 4.2 4.1 3.9 4.2 3.3* 5.0  CL 89* 87* 93* 94* 88* 92*  CO2 30  --  29 27 32 32  GLUCOSE 179* 186* 169* 147* 268* 142*  BUN 11 14 10 11 19 20   CREATININE 0.73 0.60 0.58 0.57 0.74 0.63  CALCIUM 9.2  --  8.5* 8.5* 9.0 9.1   ------------------------------------------------------------------------------------------------------------------ No results for input(s): CHOL, HDL, LDLCALC, TRIG, CHOLHDL, LDLDIRECT in the last  72 hours.  Lab Results  Component Value Date   HGBA1C 5.5 11/27/2016   ------------------------------------------------------------------------------------------------------------------ No results for input(s): TSH, T4TOTAL, T3FREE, THYROIDAB in the last 72 hours.  Invalid input(s): FREET3 ------------------------------------------------------------------------------------------------------------------ No results for input(s): VITAMINB12, FOLATE, FERRITIN, TIBC, IRON, RETICCTPCT in the last 72 hours.  Coagulation profile  Recent Labs Lab 11/27/16 0245 11/27/16 0948  INR 0.99 1.10    No results for input(s): DDIMER in the last 72 hours.  Cardiac Enzymes  Recent Labs Lab 11/27/16 0245 11/27/16 0948 11/27/16 1305  TROPONINI 1.19* 1.13* 1.28*   ------------------------------------------------------------------------------------------------------------------    Component Value Date/Time   BNP 55.7 11/26/2016 2307    Micro Results Recent Results (from the past 240 hour(s))  Blood culture (routine x 2)     Status: None (Preliminary result)   Collection Time: 11/26/16 11:15 PM  Result Value Ref Range Status   Specimen Description BLOOD RIGHT ARM  Final   Special Requests IN PEDIATRIC BOTTLE  Final   Culture NO GROWTH 3 DAYS  Final   Report Status PENDING  Incomplete  Blood culture (routine x 2)     Status: None (Preliminary result)   Collection Time: 11/26/16 11:50 PM  Result Value Ref Range Status   Specimen Description BLOOD RIGHT HAND  Final   Special Requests IN PEDIATRIC BOTTLE  Final   Culture NO GROWTH 3 DAYS  Final   Report Status PENDING  Incomplete  Respiratory Panel by PCR     Status: None   Collection Time: 11/27/16  2:46 AM  Result Value Ref Range Status   Adenovirus NOT DETECTED NOT DETECTED Final   Coronavirus 229E NOT DETECTED NOT DETECTED Final   Coronavirus HKU1 NOT DETECTED NOT DETECTED Final   Coronavirus NL63 NOT DETECTED NOT DETECTED  Final   Coronavirus OC43 NOT DETECTED NOT DETECTED Final   Metapneumovirus NOT DETECTED NOT DETECTED Final   Rhinovirus / Enterovirus NOT DETECTED NOT DETECTED Final   Influenza A NOT DETECTED NOT  DETECTED Final   Influenza B NOT DETECTED NOT DETECTED Final   Parainfluenza Virus 1 NOT DETECTED NOT DETECTED Final   Parainfluenza Virus 2 NOT DETECTED NOT DETECTED Final   Parainfluenza Virus 3 NOT DETECTED NOT DETECTED Final   Parainfluenza Virus 4 NOT DETECTED NOT DETECTED Final   Respiratory Syncytial Virus NOT DETECTED NOT DETECTED Final   Bordetella pertussis NOT DETECTED NOT DETECTED Final   Chlamydophila pneumoniae NOT DETECTED NOT DETECTED Final   Mycoplasma pneumoniae NOT DETECTED NOT DETECTED Final    Radiology Reports Dg Chest 2 View  Result Date: 11/30/2016 CLINICAL DATA:  PNA; SOB today worsening after exertion; hx HTN, ex-smoker EXAM: CHEST  2 VIEW COMPARISON:  CT and plain films of 11/28/2015. FINDINGS: Hyperinflation. Midline trachea. Mild cardiomegaly. Atherosclerosis in the transverse aorta. Small bilateral pleural effusions. No pneumothorax. Minimal improvement in right greater than left base airspace disease. No new pulmonary opacity. IMPRESSION: Slight improvement in right greater than left base airspace disease. Cardiomegaly without congestive failure. Small bilateral pleural effusions. Electronically Signed   By: Jeronimo Greaves M.D.   On: 11/30/2016 09:39   Ct Angio Chest Pe W Or Wo Contrast  Result Date: 11/27/2016 CLINICAL DATA:  Shortness of Breath EXAM: CT ANGIOGRAPHY CHEST WITH CONTRAST TECHNIQUE: Multidetector CT imaging of the chest was performed using the standard protocol during bolus administration of intravenous contrast. Multiplanar CT image reconstructions and MIPs were obtained to evaluate the vascular anatomy. CONTRAST:  65 mL Isovue 370. COMPARISON:  11/27/2015 FINDINGS: Cardiovascular: Thoracic aorta demonstrates some calcifications without aneurysmal  dilatation or dissection. No significant cardiac enlargement is noted. Mild coronary calcifications are seen. The pulmonary artery shows a normal branching pattern. No filling defect to suggest pulmonary embolism is seen. Mediastinum/Nodes: The thoracic inlet is within normal limits. Scattered small hilar lymph nodes are seen. No significant mediastinal adenopathy is noted. The esophagus as visualized is within normal limits. Lungs/Pleura: Diffuse emphysematous changes are noted. In the left upper lobe there is an 8 mm somewhat lobular nodular density with surrounding increased infiltrate. Bilateral lower lobe infiltrates are noted right greater than left. Some associated retained secretions are noted within the bronchial tree bilaterally. No other focal nodular density is seen. Upper Abdomen: Visualized upper abdomen is within normal limits. Musculoskeletal: No acute bony abnormality is noted. Review of the MIP images confirms the above findings. IMPRESSION: No evidence of pulmonary embolism. Bilateral lower lobe infiltrates right greater than left without sizable effusion. Lobular nodular density in the left upper lobe with some surrounding infiltrate. This may all be postinflammatory in nature. Non-contrast chest CT at 6-12 months is recommended. If the nodule is stable at time of repeat CT, then future CT at 18-24 months (from today's scan) is considered optional for low-risk patients, but is recommended for high-risk patients. This recommendation follows the consensus statement: Guidelines for Management of Incidental Pulmonary Nodules Detected on CT Images: From the Fleischner Society 2017; Radiology 2017; 284:228-243. Electronically Signed   By: Alcide Clever M.D.   On: 11/27/2016 11:07   Dg Chest Port 1 View  Result Date: 11/27/2016 CLINICAL DATA:  Shortness of breath. EXAM: PORTABLE CHEST 1 VIEW COMPARISON:  11/26/2016 FINDINGS: Lungs are hyperinflated. Heart size is normal. There is patchy infiltrate  involving the medial right lower lobe. Developing infiltrate also noted in the medial left lower lobe. No pulmonary edema. Remote left rib fracture. IMPRESSION: Developing bilateral lower lobe infiltrates. Electronically Signed   By: Norva Pavlov M.D.   On: 11/27/2016 07:32  Dg Chest Portable 1 View  Result Date: 11/26/2016 CLINICAL DATA:  Shortness of breath, history of COPD EXAM: PORTABLE CHEST 1 VIEW COMPARISON:  09/11/2016 FINDINGS: Hyperinflation. Focal opacity at the right lung base with possible tiny right effusion. Normal cardiomediastinal silhouette with atherosclerosis. No pneumothorax. IMPRESSION: Hyperinflation with right basilar opacity suspicious for an infiltrate. Possible tiny right pleural effusion. Electronically Signed   By: Jasmine PangKim  Fujinaga M.D.   On: 11/26/2016 22:40    Time Spent in minutes  30   Tashiana Lamarca K M.D on 12/01/2016 at 12:06 PM  Between 7am to 7pm - Pager - [phone]  After 7pm go to [email or web address] - password [email or web address]  [facility] -  Office  [phone]

## 2016-12-01 NOTE — Progress Notes (Signed)
Benefits check in process for Eliquis 5 mg twice a day. CM to f/u with results. Gae GallopAngela Anurag Scarfo [facility] At Northridge LLCBSN,CM

## 2016-12-01 NOTE — Progress Notes (Signed)
Patient presented for Lexiscan. Had nebulizer treatment and Xanax early this morning. Tolerated procedure well. Pending final stress imaging result.

## 2016-12-01 NOTE — Progress Notes (Addendum)
RT spoke with patient in great length regarding her Dulera inhaler & others as well (Symbicort)  Immediately after patient was given a nebulizer treatment (refused Dulera inhaler) patient called for Respiratory stating she couldn't breathe.  Patient was 98% on 3 lpm [place]. Patient states she is anxious. RN at bedside. Medication given. Explained indications for the use of Dulera. Patient agreed to take at this time. States she will talk with her Dr about it today. She feels her Symbicort inhaler gave her the best results.

## 2016-12-01 NOTE — Progress Notes (Signed)
RT came to do neb tx and MDI. Pt absent from floor.

## 2016-12-01 NOTE — Progress Notes (Signed)
Stress test pending - will follow-up later today and leave additional recommendations. She apparently tolerated it well.  Chrystie NoseKenneth C. Arleatha Philipps, MD, [facility] HospitalFACC Attending Cardiologist [facility]

## 2016-12-01 NOTE — Progress Notes (Signed)
Benefits check: Eliquis      Per rep at [employer]:   Eliquis: Covered, no auth required, patient can use [facility] or [facility]  $242 for 30 day retail   Previous Messages    ----- Message -----  From: Jacinta ShoeAngela H Nickholas [place]ston, RN  Sent: 12/01/2016  9:20 AM  To: [facility] Ccm Case Mgr Assistant  Subject: benefits check                  Please check eliquis 5mg  twice a day, copay prior authorization if needed. Thanks     Gae Gallopngela Eliya Bubar RN,BSN,CM [phone]

## 2016-12-01 NOTE — Care Management Important Message (Signed)
Important Message  Patient Details  Name: [name] MRN: [id number] Date of Birth: [birth year]   Medicare Important Message Given:  Yes    Kyla BalzarineShealy, Chesni Vos Abena 12/01/2016, 12:39 PM

## 2016-12-01 NOTE — Progress Notes (Signed)
RT notified RN about pt requesting saline spray for nose. Saline spray is pts home regimen per pt. RN aware

## 2016-12-01 NOTE — Progress Notes (Signed)
MV was low risk, EF normal. Significant delay in results, technical problems.  Leanna BattlesBarrett, Miloh Alcocer, PA-C 12/01/2016 7:45 PM Beeper [phone]

## 2016-12-02 LAB — GLUCOSE, CAPILLARY
Glucose-Capillary: 80 mg/dL (ref 65–99)
Glucose-Capillary: 185 mg/dL — ABNORMAL HIGH (ref 65–99)
Glucose-Capillary: 143 mg/dL — ABNORMAL HIGH (ref 65–99)
Glucose-Capillary: 142 mg/dL — ABNORMAL HIGH (ref 65–99)

## 2016-12-02 LAB — CBC
HCT: 30.5 % — ABNORMAL LOW (ref 36.0–46.0)
Hemoglobin: 9.8 g/dL — ABNORMAL LOW (ref 12.0–15.0)
MCH: 28.3 pg (ref 26.0–34.0)
MCHC: 32.1 g/dL (ref 30.0–36.0)
MCV: 88.2 fL (ref 78.0–100.0)
PLATELETS: 287 10*3/uL (ref 150–400)
RBC: 3.46 MIL/uL — ABNORMAL LOW (ref 3.87–5.11)
RDW: 13.5 % (ref 11.5–15.5)
WBC: 15.3 10*3/uL — AB (ref 4.0–10.5)

## 2016-12-02 LAB — CULTURE, BLOOD (ROUTINE X 2)
CULTURE: NO GROWTH
Culture: NO GROWTH

## 2016-12-02 LAB — BASIC METABOLIC PANEL
ANION GAP: 10 (ref 5–15)
BUN: 18 mg/dL (ref 6–20)
CALCIUM: 8.8 mg/dL — AB (ref 8.9–10.3)
CO2: 33 mmol/L — ABNORMAL HIGH (ref 22–32)
Chloride: 92 mmol/L — ABNORMAL LOW (ref 101–111)
Creatinine, Ser: 0.57 mg/dL (ref 0.44–1.00)
GFR calc Af Amer: >60 mL/min (ref >60)
GLUCOSE: 80 mg/dL (ref 65–99)
Potassium: 4.2 mmol/L (ref 3.5–5.1)
Sodium: 135 mmol/L (ref 135–145)

## 2016-12-02 MED ORDER — METHYLPREDNISOLONE SODIUM SUCC 40 MG IJ SOLR
40.0000 mg | Freq: Every day | INTRAMUSCULAR | Status: DC
  Administered 2016-12-02 – 2016-12-03 (×2): 40 mg via INTRAVENOUS
  Filled 2016-12-02 (×2): qty 1

## 2016-12-02 NOTE — Progress Notes (Signed)
PROGRESS NOTE                                                                                                                                                                                                             Patient Demographics:    [name], is a 71 y.o. female, DOB - Oct 27, 1945, OZH:[id number]  Admit date - 11/26/2016   Admitting Physician Jonah BlueJennifer Yates, MD  Outpatient Primary MD for the patient is Eartha InchBADGER,MICHAEL C, MD  LOS - 5  Chief Complaint  Patient presents with  . COPD      Brief Narrative 71 y.o. female with medical history significant of glaucoma, macular degeneration, COPD on 2.5 to 3.0 L home O2, h/o 2 prior intubations, and HTN presenting Because of acute on chronic hypoxic respiratory failure after doing some cleaning and being exposed to certain fumes at home. She also had a temp of 101 at home. CT scan suggestive pneumonia. She also had some demand ischemia with elevation of troponin.  She is being treated for pneumonia, COPD exacerbation, A. fib, pulmonary critical care cardiology has seen the patient. Stress test with low risk and normal EF.   Subjective:   No chest pain this AM but still with exertional dyspnea and more wheezing this AM.   Assessment  & Plan :   Sepsis & Acute on chronic hypoxic respiratory failure secondary to pneumonia with COPD exacerbation in a patient with advanced COPD onto a half to 3 L nasal Oxygen at Home.  - initially required BiPAP, Treated with empiric antibiotics, last dose of antibiotics on 12/01/2016 - was transitioned to oral Prednisone but more wheezing this AM and significant discomfort with even minimal exertion despite oxygen use  - will place back on Solumedrol and if pt improves, can likely be discharged home in AM  - continue BD's scheduled and as needed   COPD exacerbation - placed on solumedrol today with plan to change to PO in AM if pt  better  - Follow with primary pulmonologist Dr. Lovell SheehanJenkins after discharge.  Mild troponin elevation due to demand ischemia from #1 and 2 above - Stable echocardiogram with preserved EF of 55% and no wall motion abnormality - MV low risk with stable ED - continue aspirin and low-dose beta blocker along with Eliquis  Paroxysmal atrial fibrillation  - rate controlled  -  Per cardiology continue beta blocker and anticoagulation, Eliquis   Hypertension, essential - reasonable inpatient control   Anemia of chronic disease.  - No acute issues.  Acute on chronic leukocytosis - with steroids making this issue worse - overall WBC is trending down  - CBC In AM  ? Lung nodule versus infection - Follow with primary pulmonologist Dr. Lovell Sheehan after discharge.  Severe anxiety and pressure of speech - stable this AM   Diet : Diet Carb Modified Fluid consistency: Thin; Room service appropriate? Yes   Family Communication  :  No family at bedside   Code Status : Full  Disposition Plan  :  Likely DC in am with HHPT   Consults  :  Cards, Pulmonary  Procedures  :    Lexiscan - low risk with normal EF TTE - Left ventricle: EF 55% to 60%. Wall motion was normal; there were no regional wall motion abnormalities. Systolic function was normal. CTA - no PE, pneumonia, ? lung nodule.   DVT Prophylaxis  :   Heparin    Inpatient Medications  Scheduled Meds: . apixaban  5 mg Oral BID  . aspirin  81 mg Oral Daily  . feeding supplement (ENSURE ENLIVE)  237 mL Oral BID BM  . insulin aspart  0-15 Units Subcutaneous TID WC  . insulin aspart  0-5 Units Subcutaneous QHS  . ipratropium-albuterol  3 mL Nebulization TID  . methylPREDNISolone (SOLU-MEDROL) injection  40 mg Intravenous Daily  . mometasone-formoterol  2 puff Inhalation BID  . nebivolol  2.5 mg Oral Daily  . PARoxetine  10 mg Oral Daily   Continuous Infusions:  PRN Meds:.albuterol, ALPRAZolam,  guaiFENesin-dextromethorphan  Antibiotics  :    Anti-infectives    Start     Dose/Rate Route Frequency Ordered Stop   12/01/16 1300  azithromycin (ZITHROMAX) tablet 500 mg     500 mg Oral  Once 12/01/16 1206 12/01/16 1435   11/27/16 0800  cefTRIAXone (ROCEPHIN) 1 g in dextrose 5 % 50 mL IVPB  Status:  Discontinued     1 g 100 mL/hr over 30 Minutes Intravenous  Once 11/27/16 0157 11/27/16 0201   11/27/16 0800  azithromycin (ZITHROMAX) 500 mg in dextrose 5 % 250 mL IVPB  Status:  Discontinued     500 mg 250 mL/hr over 60 Minutes Intravenous  Once 11/27/16 0157 11/27/16 0201   11/27/16 0800  cefTRIAXone (ROCEPHIN) 1 g in dextrose 5 % 50 mL IVPB  Status:  Discontinued     1 g 100 mL/hr over 30 Minutes Intravenous Every 24 hours 11/27/16 0201 12/01/16 1206   11/27/16 0800  azithromycin (ZITHROMAX) 500 mg in dextrose 5 % 250 mL IVPB  Status:  Discontinued     500 mg 250 mL/hr over 60 Minutes Intravenous Every 24 hours 11/27/16 0201 11/30/16 1218   11/26/16 2345  vancomycin (VANCOCIN) IVPB 1000 mg/200 mL premix     1,000 mg 200 mL/hr over 60 Minutes Intravenous  Once 11/26/16 2332 11/27/16 0152   11/26/16 2345  piperacillin-tazobactam (ZOSYN) IVPB 3.375 g     3.375 g 100 mL/hr over 30 Minutes Intravenous  Once 11/26/16 2332 11/27/16 0047         Objective:   Vitals:   12/01/16 2204 12/02/16 0612 12/02/16 0832 12/02/16 0904  BP: (!) 139/51 (!) 128/49  129/60  Pulse: 69 64  76  Resp: 18 18    Temp: 97.5 F (36.4 C) 97.5 F (36.4 C)    TempSrc: Oral  Oral    SpO2: 99% 100% 99%   Weight:      Height:        Wt Readings from Last 3 Encounters:  11/27/16 53.3 kg (117 lb 6.4 oz)  09/25/16 53.3 kg (117 lb 6.4 oz)  05/05/16 53.8 kg (118 lb 9.6 oz)     Intake/Output Summary (Last 24 hours) at 12/02/16 0914 Last data filed at 12/01/16 1856  Gross per 24 hour  Intake               50 ml  Output             1000 ml  Net             -950 ml     Physical Exam  Awake Alert,  Oriented X 3, No new F.N deficits, anxious affect [place].AT,PERRAL Supple Neck,No JVD, No cervical lymphadenopathy appriciated.  Symmetrical Chest wall movement, expiratory wheezing in both left and right lower lobes, dyspnea with RR in mid 20's with minimal exertion such as getting out from to chair  RRR,No Gallops,Rubs or new Murmurs, No Parasternal Heave +ve B.Sounds, Abd Soft, No tenderness, No organomegaly appriciated, No rebound - guarding or rigidity. No Cyanosis, Clubbing or edema, No new Rash or bruise     Data Review:   CBC  Recent Labs Lab 11/26/16 2307  11/27/16 0948 11/28/16 0505 11/29/16 1049 11/30/16 0602 12/01/16 0358 12/02/16 0540  WBC 33.0*  --  35.1* 31.1* 22.0* 17.2* 17.9* 15.3*  HGB 11.1*  < > 9.7* 9.1* 10.0* 10.1* 9.3* 9.8*  HCT 34.1*  < > 29.8* 27.7* 31.4* 31.5* 28.8* 30.5*  PLT 281  --  230 216 265 291 303 287  MCV 87.2  --  86.1 86.8 86.7 86.3 86.5 88.2  MCH 28.4  --  28.0 28.5 27.6 27.7 27.9 28.3  MCHC 32.6  --  32.6 32.9 31.8 32.1 32.3 32.1  RDW 13.3  --  13.1 13.6 13.3 13.2 13.2 13.5  LYMPHSABS 0.7  --  0.7  --   --   --   --   --   MONOABS 2.6*  --  2.1*  --   --   --   --   --   EOSABS 0.0  --  0.0  --   --   --   --   --   BASOSABS 0.0  --  0.0  --   --   --   --   --   < > = values in this interval not displayed.  Chemistries   Recent Labs Lab 11/27/16 0948 11/28/16 0505 11/29/16 1049 11/30/16 0602 12/02/16 0540  NA 131* 130* 133* 132* 135  K 3.9 4.2 3.3* 5.0 4.2  CL 93* 94* 88* 92* 92*  CO2 29 27 32 32 33*  GLUCOSE 169* 147* 268* 142* 80  BUN 10 11 19 20 18   CREATININE 0.58 0.57 0.74 0.63 0.57  CALCIUM 8.5* 8.5* 9.0 9.1 8.8*   Lab Results  Component Value Date   HGBA1C 5.5 11/27/2016   Coagulation profile  Recent Labs Lab 11/27/16 0245 11/27/16 0948  INR 0.99 1.10   Cardiac Enzymes  Recent Labs Lab 11/27/16 0245 11/27/16 0948 11/27/16 1305  TROPONINI 1.19* 1.13* 1.28*    ------------------------------------------------------------------------------------------------------------------    Component Value Date/Time   BNP 55.7 11/26/2016 2307    Micro Results Recent Results (from the past 240 hour(s))  Blood culture (routine x 2)     Status:  None (Preliminary result)   Collection Time: 11/26/16 11:15 PM  Result Value Ref Range Status   Specimen Description BLOOD RIGHT ARM  Final   Special Requests IN PEDIATRIC BOTTLE  Final   Culture NO GROWTH 4 DAYS  Final   Report Status PENDING  Incomplete  Blood culture (routine x 2)     Status: None (Preliminary result)   Collection Time: 11/26/16 11:50 PM  Result Value Ref Range Status   Specimen Description BLOOD RIGHT HAND  Final   Special Requests IN PEDIATRIC BOTTLE  Final   Culture NO GROWTH 4 DAYS  Final   Report Status PENDING  Incomplete  Respiratory Panel by PCR     Status: None   Collection Time: 11/27/16  2:46 AM  Result Value Ref Range Status   Adenovirus NOT DETECTED NOT DETECTED Final   Coronavirus 229E NOT DETECTED NOT DETECTED Final   Coronavirus HKU1 NOT DETECTED NOT DETECTED Final   Coronavirus NL63 NOT DETECTED NOT DETECTED Final   Coronavirus OC43 NOT DETECTED NOT DETECTED Final   Metapneumovirus NOT DETECTED NOT DETECTED Final   Rhinovirus / Enterovirus NOT DETECTED NOT DETECTED Final   Influenza A NOT DETECTED NOT DETECTED Final   Influenza B NOT DETECTED NOT DETECTED Final   Parainfluenza Virus 1 NOT DETECTED NOT DETECTED Final   Parainfluenza Virus 2 NOT DETECTED NOT DETECTED Final   Parainfluenza Virus 3 NOT DETECTED NOT DETECTED Final   Parainfluenza Virus 4 NOT DETECTED NOT DETECTED Final   Respiratory Syncytial Virus NOT DETECTED NOT DETECTED Final   Bordetella pertussis NOT DETECTED NOT DETECTED Final   Chlamydophila pneumoniae NOT DETECTED NOT DETECTED Final   Mycoplasma pneumoniae NOT DETECTED NOT DETECTED Final    Radiology Reports Dg Chest 2 View  Result  Date: 11/30/2016 CLINICAL DATA:  PNA; SOB today worsening after exertion; hx HTN, ex-smoker EXAM: CHEST  2 VIEW COMPARISON:  CT and plain films of 11/28/2015. FINDINGS: Hyperinflation. Midline trachea. Mild cardiomegaly. Atherosclerosis in the transverse aorta. Small bilateral pleural effusions. No pneumothorax. Minimal improvement in right greater than left base airspace disease. No new pulmonary opacity. IMPRESSION: Slight improvement in right greater than left base airspace disease. Cardiomegaly without congestive failure. Small bilateral pleural effusions. Electronically Signed   By: Jeronimo Greaves M.D.   On: 11/30/2016 09:39   Ct Angio Chest Pe W Or Wo Contrast  Result Date: 11/27/2016 CLINICAL DATA:  Shortness of Breath EXAM: CT ANGIOGRAPHY CHEST WITH CONTRAST TECHNIQUE: Multidetector CT imaging of the chest was performed using the standard protocol during bolus administration of intravenous contrast. Multiplanar CT image reconstructions and MIPs were obtained to evaluate the vascular anatomy. CONTRAST:  65 mL Isovue 370. COMPARISON:  11/27/2015 FINDINGS: Cardiovascular: Thoracic aorta demonstrates some calcifications without aneurysmal dilatation or dissection. No significant cardiac enlargement is noted. Mild coronary calcifications are seen. The pulmonary artery shows a normal branching pattern. No filling defect to suggest pulmonary embolism is seen. Mediastinum/Nodes: The thoracic inlet is within normal limits. Scattered small hilar lymph nodes are seen. No significant mediastinal adenopathy is noted. The esophagus as visualized is within normal limits. Lungs/Pleura: Diffuse emphysematous changes are noted. In the left upper lobe there is an 8 mm somewhat lobular nodular density with surrounding increased infiltrate. Bilateral lower lobe infiltrates are noted right greater than left. Some associated retained secretions are noted within the bronchial tree bilaterally. No other focal nodular density is  seen. Upper Abdomen: Visualized upper abdomen is within normal limits. Musculoskeletal: No acute bony  abnormality is noted. Review of the MIP images confirms the above findings. IMPRESSION: No evidence of pulmonary embolism. Bilateral lower lobe infiltrates right greater than left without sizable effusion. Lobular nodular density in the left upper lobe with some surrounding infiltrate. This may all be postinflammatory in nature. Non-contrast chest CT at 6-12 months is recommended. If the nodule is stable at time of repeat CT, then future CT at 18-24 months (from today's scan) is considered optional for low-risk patients, but is recommended for high-risk patients. This recommendation follows the consensus statement: Guidelines for Management of Incidental Pulmonary Nodules Detected on CT Images: From the Fleischner Society 2017; Radiology 2017; 284:228-243. Electronically Signed   By: Alcide Clever M.D.   On: 11/27/2016 11:07   Nm Myocar Multi W/spect W/wall Motion / Ef  Result Date: 12/01/2016  There was no ST segment deviation noted during stress.  No T wave inversion was noted during stress.  The study is normal.  This is a low risk study.  Nuclear stress EF: 65%.  Low risk stress nuclear study with normal perfusion and normal left ventricular regional and global systolic function.   Dg Chest Port 1 View  Result Date: 11/27/2016 CLINICAL DATA:  Shortness of breath. EXAM: PORTABLE CHEST 1 VIEW COMPARISON:  11/26/2016 FINDINGS: Lungs are hyperinflated. Heart size is normal. There is patchy infiltrate involving the medial right lower lobe. Developing infiltrate also noted in the medial left lower lobe. No pulmonary edema. Remote left rib fracture. IMPRESSION: Developing bilateral lower lobe infiltrates. Electronically Signed   By: Norva Pavlov M.D.   On: 11/27/2016 07:32   Dg Chest Portable 1 View  Result Date: 11/26/2016 CLINICAL DATA:  Shortness of breath, history of COPD EXAM: PORTABLE CHEST 1  VIEW COMPARISON:  09/11/2016 FINDINGS: Hyperinflation. Focal opacity at the right lung base with possible tiny right effusion. Normal cardiomediastinal silhouette with atherosclerosis. No pneumothorax. IMPRESSION: Hyperinflation with right basilar opacity suspicious for an infiltrate. Possible tiny right pleural effusion. Electronically Signed   By: Jasmine Pang M.D.   On: 11/26/2016 22:40   Time Spent in minutes  30  Debbora Presto M.D on 12/02/2016 at 9:14 AM  Between 7am to 7pm - Pager - [phone]  After 7pm go to [email or web address] - password [facility]  [facility] -  Office  [phone]

## 2016-12-02 NOTE — Progress Notes (Signed)
ANTICOAGULATION CONSULT NOTE  Pharmacy Consult: apixaban Indication: new onset Afib, nonvalvular No Known Allergies  Patient Measurements: Height: 5' 1.2" (155.4 cm) Weight: 117 lb 6.4 oz (53.3 kg) IBW/kg (Calculated) : 48.26  Vital Signs: Temp: 97.5 F (36.4 C) (03/14 0612) Temp Source: Oral (03/14 0612) BP: 129/60 (03/14 0904) Pulse Rate: 76 (03/14 0904)  Labs:  Recent Labs  11/29/16 1049 11/30/16 0602 12/01/16 0358 12/02/16 0540  HGB 10.0* 10.1* 9.3* 9.8*  HCT 31.4* 31.5* 28.8* 30.5*  PLT 265 291 303 287  CREATININE 0.74 0.63  --  0.57    Estimated Creatinine Clearance: 49.2 mL/min (by C-G formula based on SCr of 0.57 mg/dL).   Assessment: 70 YOF presented 11/26/16 with SOB. She was transitioned from IV heparin to apixaban for new onset Afib. No bleeding noted, Hgb stable in 9's, platelets are normal.  CM reports copay for 30 day supply apixaban is $242.   Goal of Therapy:  Monitor platelets by anticoagulation protocol: Yes   Plan:  - Apixaban 5 mg PO bid appropriate for age and renal function - Education complete - Pharmacy signing off but will continue to follow peripherally  - Will patient be able to afford apixaban upon d/c??   Loura BackJennifer Lepanto, PharmD, BCPS Clinical Pharmacist Phone for today [phone] Main pharmacy - [phone] 12/02/2016 9:15 AM

## 2016-12-02 NOTE — Progress Notes (Signed)
Physical Therapy Treatment Patient Details Name: [name] MRN: [id number] DOB: 12-19-1945 Today's Date: 12/02/2016    History of Present Illness 71 y.o. female with medical history significant of glaucoma, macular degeneration, COPD on 2.5 to 3.0 L home O2, h/o 2 prior intubations, and HTN presenting Because of acute on chronic hypoxic respiratory failure. chest x-ray and CT scan suggestive pneumonia. She also had some demand ischemia with elevation of troponin.    PT Comments    Pt performed increased activity and progressed gait with emphasis on maintaining O2 sats.  Pt performed stair training in prep for d/c home.  Will plan for high level balance activities and standing there ex next session.    Follow Up Recommendations  Home health PT     Equipment Recommendations  None recommended by PT    Recommendations for Other Services       Precautions / Restrictions Precautions Precautions: Fall Restrictions Weight Bearing Restrictions: No    Mobility  Bed Mobility               General bed mobility comments: Sitting in recliner on arrival.  Transfers Overall transfer level: Modified independent Equipment used: None Transfers: Sit to/from Stand Sit to Stand: Modified independent (Device/Increase time) Stand pivot transfers: Modified independent (Device/Increase time)       General transfer comment: Good technique observed.    Ambulation/Gait Ambulation/Gait assistance: Supervision Ambulation Distance (Feet): 110 Feet (x2 trials  + 46 ft.  ) Assistive device: None Gait Pattern/deviations: Decreased stride length;Step-through pattern Gait velocity: decreased Gait velocity interpretation: Below normal speed for age/gender General Gait Details: Supervision for safety. SpO2 down to 89%-96% on 3L supplemental O2. Mild-Mod dyspnea. Required 2 seated restbreaks. No significant instability noted with gait.  No buckling.   Stairs Stairs: Yes   Stair  Management: One rail Left;Forwards;Alternating pattern Number of Stairs: 4 General stair comments: Performed with cues for sequencing and hand placement on railings.    Wheelchair Mobility    Modified Rankin (Stroke Patients Only)       Balance Overall balance assessment: Needs assistance Sitting-balance support: No upper extremity supported;Feet supported Sitting balance-Leahy Scale: Normal       Standing balance-Leahy Scale: Good                      Cognition Arousal/Alertness: Awake/alert Behavior During Therapy: Anxious;WFL for tasks assessed/performed Overall Cognitive Status: Within Functional Limits for tasks assessed                      Exercises      General Comments        Pertinent Vitals/Pain Pain Assessment: No/denies pain Pain Score: 0-No pain Faces Pain Scale: No hurt    Home Living                      Prior Function            PT Goals (current goals can now be found in the care plan section) Acute Rehab PT Goals Patient Stated Goal: figure out if she will go to SNF or home Potential to Achieve Goals: Good Progress towards PT goals: Progressing toward goals    Frequency    Min 3X/week      PT Plan Current plan remains appropriate    Co-evaluation             End of Session Equipment Utilized During Treatment: Gait belt;Oxygen Activity Tolerance: Patient  tolerated treatment well Patient left: in chair;with call bell/phone within reach Nurse Communication: Mobility status PT Visit Diagnosis: Muscle weakness (generalized) (M62.81);Difficulty in walking, not elsewhere classified (R26.2)     Time: 4098-1191 PT Time Calculation (min) (ACUTE ONLY): 25 min  Charges:  $Gait Training: 8-22 mins $Therapeutic Activity: 8-22 mins                    G Codes:       [name] 12/10/2016, 1:40 PM Joycelyn Rua, PTA pager [phone]

## 2016-12-02 NOTE — Progress Notes (Signed)
Myoview results reviewed - no evidence for ischemia or infarct. Echo is reassuring. I would attribute elevated troponin to pneumonia.  No further inpatient cardiac work-up. Would continue Eliquis for PAF - follow-up with Dr. Mayford Knifeurner as an outpatient.  Chrystie NoseKenneth C. Kehlani Vancamp, MD, [facility] Veterans HospitalFACC Attending Cardiologist [facility]

## 2016-12-02 NOTE — Progress Notes (Signed)
RN notified X 2 pts request for some nasal saline spray - pt states she uses it as a home regimen that her nose is dry. Pt states she uses it at home every day.

## 2016-12-02 NOTE — Progress Notes (Signed)
F/u scheduled [facility] (cardiology) - [address] office - 12/17/16 at 8am, appt placed in Epic F/u section. Dayna Dunn PA-C

## 2016-12-03 ENCOUNTER — Inpatient Hospital Stay (HOSPITAL_COMMUNITY): Payer: Medicare HMO

## 2016-12-03 LAB — CBC
HCT: 31.2 % — ABNORMAL LOW (ref 36.0–46.0)
HEMOGLOBIN: 10 g/dL — AB (ref 12.0–15.0)
MCH: 28.4 pg (ref 26.0–34.0)
MCHC: 32.1 g/dL (ref 30.0–36.0)
MCV: 88.6 fL (ref 78.0–100.0)
PLATELETS: 290 10*3/uL (ref 150–400)
RBC: 3.52 MIL/uL — AB (ref 3.87–5.11)
RDW: 13.4 % (ref 11.5–15.5)
WBC: 17 10*3/uL — ABNORMAL HIGH (ref 4.0–10.5)

## 2016-12-03 LAB — BASIC METABOLIC PANEL
Anion gap: 5 (ref 5–15)
BUN: 17 mg/dL (ref 6–20)
CHLORIDE: 92 mmol/L — AB (ref 101–111)
CO2: 36 mmol/L — ABNORMAL HIGH (ref 22–32)
CREATININE: 0.61 mg/dL (ref 0.44–1.00)
Calcium: 8.7 mg/dL — ABNORMAL LOW (ref 8.9–10.3)
GFR calc Af Amer: >60 mL/min (ref >60)
GFR calc non Af Amer: >60 mL/min (ref >60)
GLUCOSE: 89 mg/dL (ref 65–99)
Potassium: 4.1 mmol/L (ref 3.5–5.1)
SODIUM: 133 mmol/L — AB (ref 135–145)

## 2016-12-03 LAB — GLUCOSE, CAPILLARY
GLUCOSE-CAPILLARY: 77 mg/dL (ref 65–99)
GLUCOSE-CAPILLARY: 122 mg/dL — AB (ref 65–99)

## 2016-12-03 MED ORDER — PREDNISONE 5 MG PO TABS: ORAL_TABLET | ORAL | 0 refills | Status: DC

## 2016-12-03 MED ORDER — DOXYCYCLINE HYCLATE 100 MG PO TABS: 100.0000 mg | ORAL_TABLET | Freq: Two times a day (BID) | ORAL | 0 refills | Status: DC

## 2016-12-03 MED ORDER — APIXABAN 5 MG PO TABS: 5.0000 mg | ORAL_TABLET | Freq: Two times a day (BID) | ORAL | 0 refills | Status: DC

## 2016-12-03 MED ORDER — PAROXETINE HCL 10 MG PO TABS: 10.0000 mg | ORAL_TABLET | Freq: Every day | ORAL | 0 refills | Status: DC

## 2016-12-03 MED ORDER — GUAIFENESIN-DM 100-10 MG/5ML PO SYRP: 10.0000 mL | ORAL_SOLUTION | ORAL | 0 refills | Status: DC | PRN

## 2016-12-03 MED ORDER — NEBIVOLOL HCL 2.5 MG PO TABS: 2.5000 mg | ORAL_TABLET | Freq: Every day | ORAL | 0 refills | Status: DC

## 2016-12-03 NOTE — Discharge Summary (Signed)
Physician Discharge Summary  [name] WJX:[id number] DOB: [birth year] DOA: 11/26/2016  PCP: [name], MD  Admit date: 11/26/2016 Discharge date: 12/03/2016  Recommendations for Outpatient Follow-up:  1. Pt will need to follow up with PCP in 1-2 weeks post discharge 2. Please also check CBC to evaluate WBC  3. Please note that pt was advised to complete therapy with doxycycline and prednisone taper as outlined below 4. Pt also started on Eliquis per cardiology team recommendations  5. Please note that CXR was repeated on 3/14 and was notable for evidence of known COPD and Persistent right basilar pneumonia with small bilateral pleural effusions. No pneumothorax. 6. In addition, there was some concern of pulmonary nodules on CT chest but not clear if related to PNA ---> Diffuse emphysematous changes are noted. In the left upper lobe there is an 8 mm somewhat lobular nodular density with surrounding increased infiltrate. Please repeat CT chest without contrast would be fine in 4-6 weeks to ensure resolution 7. Please also note that pt was worried about not being able to afford Eliquis but was told apparently that it will be covered with her insurance. Please ask pt if that has been an issue for her or we can continue Eliquis.   Discharge Diagnoses:  Principal Problem:   Acute on chronic respiratory failure with hypoxia (HCC) Active Problems:   Essential hypertension   Anxiety   Tachycardia   Pneumonia   Elevated troponin   Hyperglycemia   Anemia   Sepsis (HCC)   Chest pain   PAF (paroxysmal atrial fibrillation) (HCC)  Discharge Condition: Stable  Diet recommendation: Heart healthy diet discussed in details   Brief Narrative 71 y.o.femalewith medical history significant of glaucoma, macular degeneration, COPD on 2.5 to 3.0 L home O2, h/o 2 prior intubations, and HTN presenting Because of acute on chronic hypoxic respiratory failure after doing some cleaning and being  exposed to certain fumes at home. She also had a temp of 101 at home. CT scan suggestive pneumonia. She also had some demand ischemia with elevation of troponin.  She is being treated for pneumonia, COPD exacerbation, A. fib, pulmonary critical care cardiology has seen the patient. Stress test with low risk and normal EF.   Subjective:   No chest pain this AM but still with exertional dyspnea and more wheezing this AM.   Assessment  & Plan :   Sepsis & Acute on chronic hypoxic respiratory failure secondary to RLL and LLL pneumonia with COPD exacerbation in a patient with advanced COPD onto a half to 3 L nasal Oxygen at Home.  - initially required BiPAP, Treated with empiric antibiotics, last dose of antibiotics on 12/01/2016 but  ABXcourse was prolonged on discharged to persistent PNA - was transitioned to oral Prednisone and advised to complete taper along with doxycycline  - pt can use bronchodilators as needed   COPD exacerbation - Follow with primary pulmonologist Dr. Lovell Sheehan after discharge.  Mild troponin elevation due to demand ischemia from #1 and 2 above - Stable echocardiogram with preserved EF of 55% and no wall motion abnormality - MV low risk with stable ED - continue aspirin and low-dose beta blocker along with Eliquis  Paroxysmal atrial fibrillation  - rate controlled  - Per cardiology continue beta blocker and anticoagulation, Eliquis   Hypertension, essential - reasonable inpatient control   Anemia of chronic disease.  - No acute issues.  Acute on chronic leukocytosis - with steroids making this issue worse  ? Lung  nodule versus infection - Follow with primary pulmonologist Dr. Lovell Sheehan after discharge.  Severe anxiety and pressure of speech - stable this AM   Diet : Diet Carb Modified Fluid consistency: Thin; Room service appropriate? Yes   Family Communication  :  No family at bedside   Code Status : Full  Disposition Plan  :  home    Consults  :  Cards, Pulmonary  Procedures  :    Lexiscan - low risk with normal EF TTE - Left ventricle: EF 55%to 60%. Wall motion was normal; there were no regional wallmotion abnormalities. Systolic function was normal. CTA - no PE, pneumonia, ? lung nodule.  Procedures/Studies: Dg Chest 2 View  Result Date: 11/30/2016 CLINICAL DATA:  PNA; SOB today worsening after exertion; hx HTN, ex-smoker EXAM: CHEST  2 VIEW COMPARISON:  CT and plain films of 11/28/2015. FINDINGS: Hyperinflation. Midline trachea. Mild cardiomegaly. Atherosclerosis in the transverse aorta. Small bilateral pleural effusions. No pneumothorax. Minimal improvement in right greater than left base airspace disease. No new pulmonary opacity. IMPRESSION: Slight improvement in right greater than left base airspace disease. Cardiomegaly without congestive failure. Small bilateral pleural effusions. Electronically Signed   By: Jeronimo Greaves M.D.   On: 11/30/2016 09:39   Ct Angio Chest Pe W Or Wo Contrast  Result Date: 11/27/2016 CLINICAL DATA:  Shortness of Breath EXAM: CT ANGIOGRAPHY CHEST WITH CONTRAST TECHNIQUE: Multidetector CT imaging of the chest was performed using the standard protocol during bolus administration of intravenous contrast. Multiplanar CT image reconstructions and MIPs were obtained to evaluate the vascular anatomy. CONTRAST:  65 mL Isovue 370. COMPARISON:  11/27/2015 FINDINGS: Cardiovascular: Thoracic aorta demonstrates some calcifications without aneurysmal dilatation or dissection. No significant cardiac enlargement is noted. Mild coronary calcifications are seen. The pulmonary artery shows a normal branching pattern. No filling defect to suggest pulmonary embolism is seen. Mediastinum/Nodes: The thoracic inlet is within normal limits. Scattered small hilar lymph nodes are seen. No significant mediastinal adenopathy is noted. The esophagus as visualized is within normal limits. Lungs/Pleura: Diffuse  emphysematous changes are noted. In the left upper lobe there is an 8 mm somewhat lobular nodular density with surrounding increased infiltrate. Bilateral lower lobe infiltrates are noted right greater than left. Some associated retained secretions are noted within the bronchial tree bilaterally. No other focal nodular density is seen. Upper Abdomen: Visualized upper abdomen is within normal limits. Musculoskeletal: No acute bony abnormality is noted. Review of the MIP images confirms the above findings. IMPRESSION: No evidence of pulmonary embolism. Bilateral lower lobe infiltrates right greater than left without sizable effusion. Lobular nodular density in the left upper lobe with some surrounding infiltrate. This may all be postinflammatory in nature. Non-contrast chest CT at 6-12 months is recommended. If the nodule is stable at time of repeat CT, then future CT at 18-24 months (from today's scan) is considered optional for low-risk patients, but is recommended for high-risk patients. This recommendation follows the consensus statement: Guidelines for Management of Incidental Pulmonary Nodules Detected on CT Images: From the Fleischner Society 2017; Radiology 2017; 284:228-243. Electronically Signed   By: Alcide Clever M.D.   On: 11/27/2016 11:07   Nm Myocar Multi W/spect W/wall Motion / Ef  Result Date: 12/01/2016  There was no ST segment deviation noted during stress.  No T wave inversion was noted during stress.  The study is normal.  This is a low risk study.  Nuclear stress EF: 65%.  Low risk stress nuclear study with normal perfusion and  normal left ventricular regional and global systolic function.   Dg Chest Port 1 View  Result Date: 11/27/2016 CLINICAL DATA:  Shortness of breath. EXAM: PORTABLE CHEST 1 VIEW COMPARISON:  11/26/2016 FINDINGS: Lungs are hyperinflated. Heart size is normal. There is patchy infiltrate involving the medial right lower lobe. Developing infiltrate also noted in the  medial left lower lobe. No pulmonary edema. Remote left rib fracture. IMPRESSION: Developing bilateral lower lobe infiltrates. Electronically Signed   By: Norva PavlovElizabeth  Brown M.D.   On: 11/27/2016 07:32   Dg Chest Portable 1 View  Result Date: 11/26/2016 CLINICAL DATA:  Shortness of breath, history of COPD EXAM: PORTABLE CHEST 1 VIEW COMPARISON:  09/11/2016 FINDINGS: Hyperinflation. Focal opacity at the right lung base with possible tiny right effusion. Normal cardiomediastinal silhouette with atherosclerosis. No pneumothorax. IMPRESSION: Hyperinflation with right basilar opacity suspicious for an infiltrate. Possible tiny right pleural effusion. Electronically Signed   By: Jasmine PangKim  Fujinaga M.D.   On: 11/26/2016 22:40      Discharge Exam: Vitals:   12/02/16 2156 12/03/16 0608  BP: (!) 121/55 (!) 143/55  Pulse: 68 66  Resp: 18 18  Temp: 98.1 F (36.7 C) 97.7 F (36.5 C)   Vitals:   12/02/16 2022 12/02/16 2156 12/03/16 0608 12/03/16 0832  BP:  (!) 121/55 (!) 143/55   Pulse:  68 66   Resp:  18 18   Temp:  98.1 F (36.7 C) 97.7 F (36.5 C)   TempSrc:      SpO2: 99% 99% 100% 97%  Weight:   54 kg (119 lb 0.8 oz)   Height:        General: Pt is alert, follows commands appropriately, not in acute distress Cardiovascular: Regular rate and rhythm, S1/S2 +, no murmurs, no rubs, no gallops Respiratory: diminished breath sounds at bases but no wheezing  Abdominal: Soft, non tender, non distended, bowel sounds +, no guarding   Discharge Instructions  Discharge Instructions    Diet - low sodium heart healthy    Complete by:  As directed    Increase activity slowly    Complete by:  As directed      Allergies as of 12/03/2016   No Known Allergies     Medication List    TAKE these medications   albuterol 108 (90 Base) MCG/ACT inhaler Commonly known as:  PROVENTIL HFA;VENTOLIN HFA Inhale 2 puffs into the lungs every 6 (six) hours as needed for wheezing or shortness of breath. Please  dispense generic.   ALPRAZolam 0.5 MG tablet Commonly known as:  XANAX TAKE 1 TABLET THREE TIMES DAILY AS NEEDED FOR ANXIETY   apixaban 5 MG Tabs tablet Commonly known as:  ELIQUIS Take 1 tablet (5 mg total) by mouth 2 (two) times daily.   CALCIUM PO Take 1 tablet by mouth daily.   doxycycline 100 MG tablet Commonly known as:  VIBRA-TABS Take 1 tablet (100 mg total) by mouth 2 (two) times daily.   Fluticasone-Umeclidin-Vilant 100-62.5-25 MCG/INH Aepb Commonly known as:  TRELEGY ELLIPTA Inhale 1 puff into the lungs daily.   guaiFENesin-dextromethorphan 100-10 MG/5ML syrup Commonly known as:  ROBITUSSIN DM Take 10 mLs by mouth every 4 (four) hours as needed for cough.   ipratropium-albuterol 0.5-2.5 (3) MG/3ML Soln Commonly known as:  DUONEB USE ONE VIAL VIA NEBULIZER 2 TIMES DAILY   lisinopril 20 MG tablet Commonly known as:  PRINIVIL,ZESTRIL Take 20 mg by mouth daily.   nebivolol 2.5 MG tablet Commonly known as:  BYSTOLIC Take 1  tablet (2.5 mg total) by mouth daily.   PARoxetine 10 MG tablet Commonly known as:  PAXIL Take 1 tablet (10 mg total) by mouth daily.   predniSONE 5 MG tablet Commonly known as:  DELTASONE Take 50 mg tablet on 12/04/2016 and taper down by 10 mg daily until completed   PRESERVISION AREDS 2 PO Take 2 capsules by mouth daily. Reported on 11/22/2015       Follow-up Information    Brittainy Sharol Harness, PA-C Follow up.   Specialties:  Cardiology, Radiology Why:  [facility] (cardiology) - [address] office - 12/17/16 at 8am. Arrive at 7:45am to check in. Brittainy is one of the PAs that works with Dr. Mayford Knife. Contact information: [address] [place] [address] [phone]        [name], MD Follow up.   Specialty:  Family Medicine Contact information: [address] [place] [address] [phone]            The results of significant diagnostics from this hospitalization (including imaging,  microbiology, ancillary and laboratory) are listed below for reference.     Microbiology: Recent Results (from the past 240 hour(s))  Blood culture (routine x 2)     Status: None   Collection Time: 11/26/16 11:15 PM  Result Value Ref Range Status   Specimen Description BLOOD RIGHT ARM  Final   Special Requests IN PEDIATRIC BOTTLE  Final   Culture NO GROWTH 5 DAYS  Final   Report Status 12/02/2016 FINAL  Final  Blood culture (routine x 2)     Status: None   Collection Time: 11/26/16 11:50 PM  Result Value Ref Range Status   Specimen Description BLOOD RIGHT HAND  Final   Special Requests IN PEDIATRIC BOTTLE  Final   Culture NO GROWTH 5 DAYS  Final   Report Status 12/02/2016 FINAL  Final  Respiratory Panel by PCR     Status: None   Collection Time: 11/27/16  2:46 AM  Result Value Ref Range Status   Adenovirus NOT DETECTED NOT DETECTED Final   Coronavirus 229E NOT DETECTED NOT DETECTED Final   Coronavirus HKU1 NOT DETECTED NOT DETECTED Final   Coronavirus NL63 NOT DETECTED NOT DETECTED Final   Coronavirus OC43 NOT DETECTED NOT DETECTED Final   Metapneumovirus NOT DETECTED NOT DETECTED Final   Rhinovirus / Enterovirus NOT DETECTED NOT DETECTED Final   Influenza A NOT DETECTED NOT DETECTED Final   Influenza B NOT DETECTED NOT DETECTED Final   Parainfluenza Virus 1 NOT DETECTED NOT DETECTED Final   Parainfluenza Virus 2 NOT DETECTED NOT DETECTED Final   Parainfluenza Virus 3 NOT DETECTED NOT DETECTED Final   Parainfluenza Virus 4 NOT DETECTED NOT DETECTED Final   Respiratory Syncytial Virus NOT DETECTED NOT DETECTED Final   Bordetella pertussis NOT DETECTED NOT DETECTED Final   Chlamydophila pneumoniae NOT DETECTED NOT DETECTED Final   Mycoplasma pneumoniae NOT DETECTED NOT DETECTED Final     Labs: Basic Metabolic Panel:  Recent Labs Lab 11/28/16 0505 11/29/16 1049 11/30/16 0602 12/02/16 0540 12/03/16 0412  NA 130* 133* 132* 135 133*  K 4.2 3.3* 5.0 4.2 4.1  CL  94* 88* 92* 92* 92*  CO2 27 32 32 33* 36*  GLUCOSE 147* 268* 142* 80 89  BUN 11 19 20 18 17   CREATININE 0.57 0.74 0.63 0.57 0.61  CALCIUM 8.5* 9.0 9.1 8.8* 8.7*   CBC:  Recent Labs Lab 11/26/16 2307  11/27/16 0948  11/29/16  1049 11/30/16 0602 12/01/16 0358 12/02/16 0540 12/03/16 0412  WBC 33.0*  --  35.1*  < > 22.0* 17.2* 17.9* 15.3* 17.0*  NEUTROABS 29.7*  --  32.3*  --   --   --   --   --   --   HGB 11.1*  < > 9.7*  < > 10.0* 10.1* 9.3* 9.8* 10.0*  HCT 34.1*  < > 29.8*  < > 31.4* 31.5* 28.8* 30.5* 31.2*  MCV 87.2  --  86.1  < > 86.7 86.3 86.5 88.2 88.6  PLT 281  --  230  < > 265 291 303 287 290  < > = values in this interval not displayed. Cardiac Enzymes:  Recent Labs Lab 11/27/16 0245 11/27/16 0948 11/27/16 1305  TROPONINI 1.19* 1.13* 1.28*   BNP: BNP (last 3 results)  Recent Labs  11/26/16 2307  BNP 55.7    CBG:  Recent Labs Lab 12/02/16 0748 12/02/16 1231 12/02/16 1737 12/02/16 2155 12/03/16 0803  GLUCAP 80 185* 143* 142* 77     SIGNED: Time coordinating discharge: 30 minutes  MAGICK-Teresia Myint, MD  [facility] 12/03/2016, 10:26 AM Pager [phone]  If 7PM-7AM, please contact night-coverage [email or web address] Password [email or web address]

## 2016-12-03 NOTE — Progress Notes (Signed)
OT Note  Contacted Case Manager regarding need for HHOT. Anticipate DC home. [facility] Thanks  [facility] Shearon Clonch, OT/L  [phone] 12/03/2016

## 2016-12-03 NOTE — Care Management Note (Addendum)
Case Management Note  Patient Details  Name: [name] MRN: [id number] Date of Birth: [birth year]  Subjective/Objective:        Admitted with acute on chronic respiratory failure with hypoxia.            Action/Plan: Plan is to d/c to home with home health services(PT).  [employer] rep.will delivered portable oxygen tank to bedside for transportation to home. Daughter is to provide transportation to home.  Expected Discharge Date:  12/03/16               Expected Discharge Plan:  Home w Home Health Services (Lives alone)  In-House Referral:  Clinical Social Work  Discharge planning Services  CM Consult  Post Acute Care Choice:  Resumption of Svcs/PTA Provider Choice offered to:  Patient  DME Arranged:  Oxygen DME Agency:  [facility], pt active with [employer]. Christoper Allegra[employer] rep., Chalmers CaterJames [phone]- [phone]  HH Arranged:   PT HH Agency:   [facility], referral made with Lupita LeashDonna @ [phone]  Status of Service:  In process, will continue to follow  If discussed at Long Length of Stay Meetings, dates discussed:    Additional Comments:  Epifanio LeschesCole, Navah Grondin Hudson, RN 12/03/2016, 12:02 PM

## 2016-12-03 NOTE — Progress Notes (Signed)
Physical Therapy Treatment Patient Details Name: Kaitlyn MoralesFrances L Escue MRN: [id number] DOB: 12/05/1945 Today's Date: 12/03/2016    History of Present Illness 71 y.o. female with medical history significant of glaucoma, macular degeneration, COPD on 2.5 to 3.0 L home O2, h/o 2 prior intubations, and HTN presenting Because of acute on chronic hypoxic respiratory failure. chest x-ray and CT scan suggestive pneumonia. She also had some demand ischemia with elevation of troponin.    PT Comments    Pt performed increased gait.  Pt remains to require cues for recognizing signs of too much.  PTA educated patient on HEP, including frequency and technique.  Pt issued HEP for use at home and verbalized and demonstrated understanding.  Pt with plans to d/c home today.  Will recommend a cane for energy conservation and inform supervising PT of patient recommendation.  CM informed of need for cane at this time.     Follow Up Recommendations  Home health PT     Equipment Recommendations  Cane    Recommendations for Other Services       Precautions / Restrictions Precautions Precautions: Fall Restrictions Weight Bearing Restrictions: No    Mobility  Bed Mobility Overal bed mobility: Modified Independent Bed Mobility: Supine to Sit;Sit to Supine     Supine to sit: Modified independent (Device/Increase time) Sit to supine: Modified independent (Device/Increase time)   General bed mobility comments: Good technique no assist needed.    Transfers Overall transfer level: Modified independent Equipment used: None Transfers: Sit to/from Stand Sit to Stand: Modified independent (Device/Increase time) Stand pivot transfers: Modified independent (Device/Increase time)       General transfer comment: Good technique observed.    Ambulation/Gait Ambulation/Gait assistance: Supervision Ambulation Distance (Feet): 200 Feet (x1 seated rest break half way through trial.  ) Assistive device:  None Gait Pattern/deviations: Decreased stride length;Step-through pattern Gait velocity: decreased   General Gait Details: Supervision for safety. SpO2 down to 90%-96% on 3L supplemental O2. Mild-Mod dyspnea. Required 1 seated restbreaks. No significant instability noted with gait.  No buckling.  Pt would benefit from a cane for energy conservation with community ambulation.     Stairs            Wheelchair Mobility    Modified Rankin (Stroke Patients Only)       Balance     Sitting balance-Leahy Scale: Normal       Standing balance-Leahy Scale: Fair                      Cognition Arousal/Alertness: Awake/alert Behavior During Therapy: Anxious;WFL for tasks assessed/performed Overall Cognitive Status: Within Functional Limits for tasks assessed                      Exercises General Exercises - Lower Extremity Ankle Circles/Pumps: AROM;Both;10 reps;Supine Quad Sets: AROM;Both;10 reps;Supine Heel Slides: AROM;Both;10 reps;Supine Hip ABduction/ADduction: AROM;Both;20 reps;Supine;Standing (1x10 in standing and 1x10 in supine.  ) Straight Leg Raises: AROM;Both;10 reps;Supine Heel Raises: AROM;Both;10 reps;Standing Mini-Sqauts: AROM;Both;10 reps;Standing    General Comments        Pertinent Vitals/Pain Pain Assessment: No/denies pain Faces Pain Scale: No hurt    Home Living                      Prior Function            PT Goals (current goals can now be found in the care plan section) Acute Rehab PT Goals  Patient Stated Goal: To go home and continue exercising Potential to Achieve Goals: Good Progress towards PT goals: Progressing toward goals    Frequency    Min 3X/week      PT Plan Current plan remains appropriate    Co-evaluation             End of Session Equipment Utilized During Treatment: Oxygen;Gait belt Activity Tolerance: Patient tolerated treatment well Patient left: in chair;with call bell/phone  within reach Nurse Communication: Mobility status PT Visit Diagnosis: Muscle weakness (generalized) (M62.81);Difficulty in walking, not elsewhere classified (R26.2)     Time: 0981-1914 PT Time Calculation (min) (ACUTE ONLY): 40 min  Charges:  $Gait Training: 8-22 mins $Therapeutic Exercise: 23-37 mins                    G Codes:       Florestine Avers 12-25-2016, 12:48 PM Joycelyn Rua, PTA pager [phone]

## 2016-12-03 NOTE — Progress Notes (Signed)
Pt given discharge instructions, prescriptions, and care notes. Pt verbalized understanding AEB no further questions or concerns at this time. IV was discontinued, no redness, pain, or swelling noted at this time. Telemetry discontinued and Centralized Telemetry was notified. Pt left the floor via wheelchair with staff in stable condition. 

## 2016-12-07 ENCOUNTER — Encounter: Payer: Self-pay | Admitting: *Deleted

## 2016-12-17 ENCOUNTER — Encounter: Payer: Self-pay | Admitting: Cardiology

## 2016-12-17 ENCOUNTER — Ambulatory Visit (INDEPENDENT_AMBULATORY_CARE_PROVIDER_SITE_OTHER): Payer: Medicare HMO | Admitting: Cardiology

## 2016-12-17 VITALS — BP 124/64 | HR 70 | Ht 61.0 in | Wt 117.0 lb

## 2016-12-17 DIAGNOSIS — R000 Tachycardia, unspecified: Secondary | ICD-10-CM

## 2016-12-17 MED ORDER — NEBIVOLOL HCL 2.5 MG PO TABS: 2.5000 mg | ORAL_TABLET | Freq: Every day | ORAL | 3 refills | Status: DC

## 2016-12-17 NOTE — Progress Notes (Signed)
12/17/2016 [name]   02/11/1946  [id number]  Primary Physician [name], MD Primary Cardiologist: Dr. Mayford Knife   Reason for Visit/CC: [facility] F/u for Elevated Troponin in the Setting of PNA  HPI:  [name] is a 71 y.o. female with a history of previous heavy tobacco abuse (quit 2016), COPD on 2.5L home O2, HTN, and macular degeneration who presented to [facility] on 11/27/16 with SOB and fevers. She was found to have sepsis 2/2 PNA and AECOPD. Cardiology consulted for elevated troponin.  Her troponin peaked at 1.19. This was felt higher than expected for demand ischemia, thus cardiac evaluation was recommended. Once she recovered from her sepsis/ acute pulmonary illnesses's, she underwent a nuclear stress test. This was a low risk study with normal perfusion and normal LVEF. Her systolic function was also normal by echo. EF was 55-60%. No significant valvular abnormalities. Based on w/u, her elevated troponin was felt related to her PNA. There was no indication for further ischemic w/u.   In addition, patient was noted to have brief runs of PAF on telemetry during her hospitalization. She converted back to NSR prior to discharge. Her CHA2DS2 VASc score was calculated at 4 (HTN, age, vasc dz (noted on CT scan), Fsex). She was subsequently placed on Eliquis for anticoagulation, 5 mg BID. She was continued on Bystolic for rate control.   She presents back to clinic for post hospital f/u. EKG today shows NSR. HR is well controlled at 70 bpm. BP is stable at 124/64.   She has done fairly well since discharge. She has had some occasional fatigue however her breathing is improved and she denies any recurrent chest discomfort. She reports full compliance with BB and Eliquis. She has concerns regarding cost of Eliquis.    Current Meds  Medication Sig  . albuterol (PROVENTIL HFA;VENTOLIN HFA) 108 (90 Base) MCG/ACT inhaler Inhale 2 puffs into the lungs every 6 (six) hours as needed  for wheezing or shortness of breath. Please dispense generic.  Marland Kitchen ALPRAZolam (XANAX) 0.5 MG tablet TAKE 1 TABLET THREE TIMES DAILY AS NEEDED FOR ANXIETY  . apixaban (ELIQUIS) 5 MG TABS tablet Take 1 tablet (5 mg total) by mouth 2 (two) times daily.  Marland Kitchen CALCIUM PO Take 1 tablet by mouth daily.  Marland Kitchen guaiFENesin-dextromethorphan (ROBITUSSIN DM) 100-10 MG/5ML syrup Take 10 mLs by mouth every 4 (four) hours as needed for cough.  Marland Kitchen ipratropium-albuterol (DUONEB) 0.5-2.5 (3) MG/3ML SOLN Take 3 mLs by nebulization as directed. Use one vial via nebulizer 2 times daily  . lisinopril (PRINIVIL,ZESTRIL) 20 MG tablet Take 20 mg by mouth daily.  . nebivolol (BYSTOLIC) 2.5 MG tablet Take 1 tablet (2.5 mg total) by mouth daily.  Marland Kitchen PARoxetine (PAXIL) 10 MG tablet Take 1 tablet (10 mg total) by mouth daily.  . [DISCONTINUED] nebivolol (BYSTOLIC) 2.5 MG tablet Take 1 tablet (2.5 mg total) by mouth daily.   No Known Allergies Past Medical History:  Diagnosis Date  . Chronic airway obstruction, not elsewhere classified   . Glaucoma   . H/O chronic bronchitis   . Hypertension   . Macular degeneration   . Pneumonia   . Shortness of breath 10/08/11   "all the time last 1 1/2 wk"  . Ventilator dependent [facility]) 2004; 2008   "for mucous plugs"   Family History  Problem Relation Age of Onset  . Liver cancer Mother   . Emphysema Father    Past Surgical History:  Procedure Laterality Date  .  DILATION AND CURETTAGE OF UTERUS  1970's  . OOPHORECTOMY  1986  . VAGINAL HYSTERECTOMY  1978   partial   Social History   Social History  . Marital status: Married    Spouse name: N/A  . Number of children: N/A  . Years of education: N/A   Occupational History  . Retired    Social History Main Topics  . Smoking status: Former Smoker    Packs/day: 0.50    Years: 40.00    Types: Cigarettes    Quit date: 06/22/2015  . Smokeless tobacco: Former NeurosurgeonUser    Quit date: 02/26/2015     Comment: 4-5 cigs a day - QUIT Oct  2016  . Alcohol use 4.2 oz/week    7 Cans of beer per week  . Drug use: No  . Sexual activity: No   Other Topics Concern  . Not on file   Social History Narrative  . No narrative on file     Review of Systems: General: negative for chills, fever, night sweats or weight changes.  Cardiovascular: negative for chest pain, dyspnea on exertion, edema, orthopnea, palpitations, paroxysmal nocturnal dyspnea or shortness of breath Dermatological: negative for rash Respiratory: negative for cough or wheezing Urologic: negative for hematuria Abdominal: negative for nausea, vomiting, diarrhea, bright red blood per rectum, melena, or hematemesis Neurologic: negative for visual changes, syncope, or dizziness All other systems reviewed and are otherwise negative except as noted above.   Physical Exam:  Blood pressure 124/64, pulse 70, height 5\' 1"  (1.549 m), weight 117 lb (53.1 kg).  General appearance: alert, cooperative and no distress Neck: no carotid bruit and no JVD Lungs: clear to auscultation bilaterally Heart: regular rate and rhythm, S1, S2 normal, no murmur, click, rub or gallop Extremities: extremities normal, atraumatic, no cyanosis or edema Pulses: 2+ and symmetric Skin: Skin color, texture, turgor normal. No rashes or lesions Neurologic: Grossly normal  EKG NSR. 70 bpm   ASSESSMENT AND PLAN:   1. Elevated Troponin: felt related to demand ischemia from her PNA + COPD exacerbation. NST was nonischemic. 2D echo with normal LVED. No recurrent CP. No further ischemic w/u.  2. PAF: noted during recent hospitalization. EKG today shows NSR. HR is well controlled with Bystolic in the 70s. BP is stable. CHA2DS2 VASc score is 4. She reports full compliance with Eliquis but has concerns regarding cost. We discussed the alternative of coumadin. Pt likes the convenience of Eliquis and wishes to continue with this for now but will notify our office if she decides to change to coumadin.  Patient has medication assistance application. Samples were also given today. She denies any abnormal bleeding with Eliquis.   3. COPD: acute exacerbation resolved. She is on chronic home O2.   4. PNA: resolved.   F/U: Follow-up with Dr. Mayford Knifeurner in 3 months   Robbie LisBrittainy Xavi Tomasik [facility] 12/17/2016 9:21 AM

## 2016-12-17 NOTE — Patient Instructions (Addendum)
Medication Instructions:   Your physician recommends that you continue on your current medications as directed. Please refer to the Current Medication list given to you today.   If you need a refill on your cardiac medications before your next appointment, please call your pharmacy.  Labwork: NONE ORDERED  TODAY    Testing/Procedures: NONE ORDERED  TODAY    Follow-Up:  IN 3 MONTHS WITH DR TURNER    Any Other Special Instructions Will Be Listed Below (If Applicable).  PLEASE CONTACT CLINIC IF YOU WOULD LIKE TO CHANGE FROM ELIQUIS  TO  COUMADIN

## 2016-12-21 ENCOUNTER — Telehealth: Payer: Self-pay | Admitting: Cardiology

## 2016-12-21 NOTE — Telephone Encounter (Signed)
LMTCB

## 2016-12-21 NOTE — Telephone Encounter (Signed)
New Message  Pt c/o medication issue:  1. Name of Medication: nebivolol 2.5 mg tablet once daily  2. How are you currently taking this medication (dosage and times per day)? See above  3. Are you having a reaction (difficulty breathing--STAT)? N/A  4. What is your medication issue? Pt voiced wanting to know as to why she has to take bystolic medication  Pt voiced watning to know if there's a generic for bystolic medication

## 2016-12-22 ENCOUNTER — Telehealth: Payer: Self-pay | Admitting: Cardiology

## 2016-12-22 NOTE — Telephone Encounter (Signed)
Follow up ° °Pt returning call, please f/u °

## 2016-12-22 NOTE — Telephone Encounter (Signed)
PT CALLED  AND  HAS  CONCERNS  RE BYSTOLIC  COST  IS  GOING TO  RUN $318.00 FOR  3 MONTHS   MAY BE LESS LATER IN YEAR  BUT  ALSO WILL BE  IN [place]  AS WELL AND  HAS   OTHER   HIGH COST  MEDS PT   WILL CHECK WITH  INSURANCE  CO  AND  FIND OUT  WHAT  MEDS   COULD TAKE  THAT  WOULD BE CHEAPER .

## 2016-12-22 NOTE — Telephone Encounter (Signed)
Would not recommend atenolol or metoprolol with her history of COPD.  Could use Toprol XL  daily

## 2016-12-22 NOTE — Telephone Encounter (Signed)
New Message    Wants to know if she can get prescriptions for either of these medications Metoprolol, atenolol so she won't have to pay a copay. Requesting call back

## 2016-12-22 NOTE — Telephone Encounter (Signed)
SENT TO DR TURNER FOR REVIEW .[name]

## 2016-12-22 NOTE — Telephone Encounter (Signed)
LMTCB ./CY 

## 2016-12-23 MED ORDER — METOPROLOL SUCCINATE ER 25 MG PO TB24: 25.0000 mg | ORAL_TABLET | Freq: Every day | ORAL | 2 refills | Status: DC

## 2016-12-23 NOTE — Telephone Encounter (Signed)
Reviewed with Dr. Mayford Knife and OK to use Metoprolol Succinate.  I spoke with pt and gave her information from Dr. Mayford Knife.  Pt wants to check with her insurance before making change.  She will call us back after checking with insurance

## 2016-12-23 NOTE — Telephone Encounter (Signed)
Pt called back and states Metoprolol Succinate is covered by insurance and she would like to change to this.  Will send to [facility] mail order.

## 2016-12-24 ENCOUNTER — Other Ambulatory Visit: Payer: Self-pay

## 2016-12-24 NOTE — Telephone Encounter (Signed)
[facility] requesting refill on Bystolic. Medication was d/c for patient. Refill is not appropriate at this time

## 2016-12-25 ENCOUNTER — Telehealth: Payer: Self-pay | Admitting: *Deleted

## 2016-12-25 NOTE — Telephone Encounter (Signed)
Patient called and requested eliquis samples. She will be applying for patient assistance but she has not yet met her required spend down amount. She is aware that I can place some samples at the front desk but that we cannot do this on a monthly basis. Patient verbalized understanding and was appreciative for samples.

## 2016-12-30 ENCOUNTER — Telehealth: Payer: Self-pay | Admitting: Cardiology

## 2016-12-30 NOTE — Telephone Encounter (Signed)
Spoke with patient who called to ask if recent itching and rash could be coming from new medications. I asked about the appearance of the rash and patient states she has some blisters on her fingers and arm and has red bumps on her chest between her breasts. She complains of itching in all areas. She has not started metoprolol yet, states she wanted to finish bystolic that she was taking. She started bystolic and eliquis approximately 1 month ago. I advised that I will discuss with our pharmacist and call her back. She verbalized understanding and agreement.

## 2016-12-30 NOTE — Telephone Encounter (Signed)
I reviewed patient complaint with Prudence Davidson, RPh. She states this type of skin reaction is more common with bystolic than with eliquis. She advised patient should stop bystolic and wait 48-72 hours before starting Toprol. I called patient back to review advice with her and she states after we hung up, she remembered that she had also started the Brio inhaler just prior to the rash and itching starting. She states she read the package insert and has concerns about the warnings. I advised her that she will need to call PCP for advice regarding changing inhalers and that also she should not stop more than 1 medication at the time or it will be difficult to assess which medication was causing the problem. After several more minutes of discussion, she states she will not take any more Bystolic and will start Toprol on Saturday. I advised her to call back with additional questions or concerns. She thanked me for the call.

## 2016-12-30 NOTE — Telephone Encounter (Signed)
Pt has a rashand been itching.Today she has a blister on her arm.s She is  veryconcerned and wonder if it might be her most recent medicine causing it. She just recently started taking Eliquis and Bystolic.

## 2017-01-01 ENCOUNTER — Telehealth: Payer: Self-pay | Admitting: Cardiology

## 2017-01-01 NOTE — Telephone Encounter (Signed)
I spoke with pt, pt states symptoms are as noted -pt denies difficulty breathing at night, productive cough.  Pt states she has been off Bystolic since 12/30/16 and is due to start Toprol XL 58m mg daily tomorrow. Pt states her rash/itchy skin is unchanged since stopping Bystolic.  I reviewed with Dr Kathi Ludwig Dr Thurman Coyer new recommendations, continue to monitor symptoms and vital signs, start Toprol XL tomorrow as planned. Pt aware of recommendations, she knows to contact us if change in symptoms.

## 2017-01-01 NOTE — Telephone Encounter (Signed)
I spoke with [name], nurse [facility]. Pt seems to be more short of breath for the last 2 days, especially with exertion. Beth states pt has trace- 1+ bilateral edema, no chest pain, weight is within a pound of usual weight, HR is 80 and regular, BP 140/70.

## 2017-01-01 NOTE — Telephone Encounter (Signed)
New Message     Pt c/o Shortness Of Breath: STAT if SOB developed within the last 24 hours or pt is noticeably SOB on the phone  1. Are you currently SOB (can you hear that pt is SOB on the phone)? Talked to nurse   2. How long have you been experiencing SOB? Since stopping bystolic   3. Are you SOB when sitting or when up moving around? When moving around  4. Are you currently experiencing any other symptoms? Lower extremity adema

## 2017-01-18 ENCOUNTER — Telehealth: Payer: Self-pay | Admitting: Cardiology

## 2017-01-18 ENCOUNTER — Other Ambulatory Visit: Payer: Self-pay

## 2017-01-18 MED ORDER — METOPROLOL SUCCINATE ER 50 MG PO TB24: 50.0000 mg | ORAL_TABLET | Freq: Every day | ORAL | 3 refills | Status: DC

## 2017-01-18 NOTE — Telephone Encounter (Signed)
Called patient's home health nurse, Waynetta Sandy, RN at [phone] and told her that Dr. Mayford Knife wanted to increase patient's Toprol XL to 50 mg daily. Instructed nurse for patient to check patient's blood pressure for a whole week and to call us back with the results. Beth verbalized understanding. Rx sent to patient's preferred pharmacy at [facility].

## 2017-01-18 NOTE — Telephone Encounter (Signed)
I called and spoke with Waynetta Sandy, RN (home health).  She states the patient was taken off bystolic about 3 weeks ago due to a possible rash. She was put on metoprolol succ 25 mg once daily. Per Beth, the patient's SBP was running in the mid-upper 130's on bystolic, but on metoprolol she has been running 160/ 60's last week and 162/60's this week- her HR is ok around 78-82 bpm. Per Beth, the patient's rash was on her chest area, and this did not resolve immediately after stopping bystolic.  She still a few spots last week, but this week she is clear- she questions if bystolic was even the cause of the rash.  Confirmed with Beth that the patient's BP readings were after her metoprolol was taken. I advised I will forward a message to Dr. Mayford Knife to review and we will call back with further recommendations.  She denies any other symptoms at this time aside from occasional dizziness, but this is more positional.    The patient is due to follow up with Dr. Cyndia Bent next week.

## 2017-01-18 NOTE — Telephone Encounter (Signed)
Increase Toprol XL to  daily and check BP daily for a week and call with results

## 2017-01-18 NOTE — Telephone Encounter (Signed)
pls call Beth with [facility] regarding pt's BP running in the 160's in the last 3 weeks -this has been sinc she was changed from [facility] to rash to Metoprolol-Beth wondered if dosage needs to be increased-pls clal [phone]

## 2017-01-20 ENCOUNTER — Telehealth: Payer: Self-pay | Admitting: Cardiology

## 2017-01-20 ENCOUNTER — Other Ambulatory Visit: Payer: Self-pay | Admitting: *Deleted

## 2017-01-20 MED ORDER — APIXABAN 5 MG PO TABS: 5.0000 mg | ORAL_TABLET | Freq: Two times a day (BID) | ORAL | 3 refills | Status: DC

## 2017-01-20 NOTE — Telephone Encounter (Signed)
New message    Pt is calling.   Patient calling the office for samples of medication:   1.  What medication and dosage are you requesting samples for? Eliquis 5 mg  2.  Are you currently out of this medication? No, pt states she has a week left.

## 2017-01-25 ENCOUNTER — Encounter: Payer: Self-pay | Admitting: Internal Medicine

## 2017-01-25 ENCOUNTER — Other Ambulatory Visit (INDEPENDENT_AMBULATORY_CARE_PROVIDER_SITE_OTHER): Payer: Medicare HMO

## 2017-01-25 ENCOUNTER — Ambulatory Visit (INDEPENDENT_AMBULATORY_CARE_PROVIDER_SITE_OTHER): Payer: Medicare HMO | Admitting: Internal Medicine

## 2017-01-25 ENCOUNTER — Ambulatory Visit (INDEPENDENT_AMBULATORY_CARE_PROVIDER_SITE_OTHER)
Admission: RE | Admit: 2017-01-25 | Discharge: 2017-01-25 | Disposition: A | Discharge status: Home / Self Care | Payer: Medicare HMO | Source: Ambulatory Visit | Attending: Internal Medicine | Admitting: Internal Medicine

## 2017-01-25 ENCOUNTER — Other Ambulatory Visit: Payer: Medicare HMO

## 2017-01-25 VITALS — BP 118/70 | HR 88 | Ht 61.0 in | Wt 115.0 lb

## 2017-01-25 DIAGNOSIS — R942 Abnormal results of pulmonary function studies: Secondary | ICD-10-CM

## 2017-01-25 DIAGNOSIS — J189 Pneumonia, unspecified organism: Secondary | ICD-10-CM

## 2017-01-25 DIAGNOSIS — D509 Iron deficiency anemia, unspecified: Secondary | ICD-10-CM | POA: Diagnosis not present

## 2017-01-25 DIAGNOSIS — R911 Solitary pulmonary nodule: Secondary | ICD-10-CM

## 2017-01-25 DIAGNOSIS — J181 Lobar pneumonia, unspecified organism: Secondary | ICD-10-CM | POA: Diagnosis not present

## 2017-01-25 DIAGNOSIS — J9611 Chronic respiratory failure with hypoxia: Secondary | ICD-10-CM

## 2017-01-25 DIAGNOSIS — J441 Chronic obstructive pulmonary disease with (acute) exacerbation: Secondary | ICD-10-CM

## 2017-01-25 LAB — CBC WITH DIFFERENTIAL/PLATELET
BASOS ABS: 0.2 10*3/uL — AB (ref 0.0–0.1)
Basophils Relative: 2 % (ref 0.0–3.0)
EOS PCT: 9.7 % — AB (ref 0.0–5.0)
Eosinophils Absolute: 1.2 10*3/uL — ABNORMAL HIGH (ref 0.0–0.7)
HEMATOCRIT: 39.1 % (ref 36.0–46.0)
Hemoglobin: 12.7 g/dL (ref 12.0–15.0)
LYMPHS ABS: 2.8 10*3/uL (ref 0.7–4.0)
LYMPHS PCT: 23.3 % (ref 12.0–46.0)
MCHC: 32.4 g/dL (ref 30.0–36.0)
MCV: 87.8 fl (ref 78.0–100.0)
MONOS PCT: 10.5 % (ref 3.0–12.0)
Monocytes Absolute: 1.3 10*3/uL — ABNORMAL HIGH (ref 0.1–1.0)
NEUTROS ABS: 6.6 10*3/uL (ref 1.4–7.7)
NEUTROS PCT: 54.5 % (ref 43.0–77.0)
PLATELETS: 309 10*3/uL (ref 150.0–400.0)
RBC: 4.45 Mil/uL (ref 3.87–5.11)
RDW: 12.7 % (ref 11.5–15.5)
WBC: 12.1 10*3/uL — ABNORMAL HIGH (ref 4.0–10.5)

## 2017-01-25 MED ORDER — ALPRAZOLAM 0.5 MG PO TABS: ORAL_TABLET | ORAL | 1 refills | Status: DC

## 2017-01-25 MED ORDER — BUDESONIDE-FORMOTEROL FUMARATE 160-4.5 MCG/ACT IN AERO: 2.0000 | INHALATION_SPRAY | Freq: Two times a day (BID) | RESPIRATORY_TRACT | 0 refills | Status: DC

## 2017-01-25 MED ORDER — BUDESONIDE-FORMOTEROL FUMARATE 160-4.5 MCG/ACT IN AERO: 2.0000 | INHALATION_SPRAY | Freq: Two times a day (BID) | RESPIRATORY_TRACT | 3 refills | Status: DC

## 2017-01-25 NOTE — Progress Notes (Signed)
Patient ID: [name], female    DOB: [birth year], 71 y.o.   MRN: [id number]  HPI female former smoker followed for COPD mixed type/Gold D, chronic hypoxic respiratory failure, complicated by anxiety, hyponatremia, HBP, aortic atherosclerosis O2 2-4 liters/[facility] PFT 07/07/11-severe obstructive airways disease, FVC 0.37, with response to BD -----------------------------------------------------------------------------  09/25/2016-71 year old female former smoker followed for COPD mixed type/Gold D, chronic hypoxic respiratory failure, complicated by anxiety, hyponatremia, HBP, aortic atherosclerosis, glaucoma O2-4 L/[facility] LOV 12/22- educated med techniques. Acute visit-shortness of breath. Symbicort, albuterol HFA, pulmonary/DuoNeb used twice daily Here with her daughter today. She expresses concern that she is more short of breath without change in cough, chest pain, palpitation. Scant sputum is white. No acute events. Mostly dyspnea on exertion. She worries about recurrence of small airway mucous plugs diagnosed by chest CT several years ago. Using nebulizer every 4-6 hours with no increase in secretion clearance. Taking Robitussin-DM. Understands instruction to adjust oxygen between 2 and 4 L, but she believes it mostly at 2 or 3. CXR 09/11/2016 IMPRESSION: COPD.  No acute pulmonary disease   01/25/17-71 year old female former smoker followed for COPD mixed type/Gold D, chronic hypoxic respiratory failure, complicated by anxiety, hyponatremia, HBP, aortic atherosclerosis, PAF, glaucoma,  O2 2.5 L/up to 4 L exertion/[facility] Pneumonia/sepsis treated hospital in March. Cardiology workup for elevated troponin showed normal perfusion normal EF. PAF treated with Eliquis and Toprol. She requalified today for home O2, requiring 2.5 L. FOLLOWS FOR dme [facility]. Wears O2 24-7. increase sob  She remains more short of breath she expects, although she is always short of breath. Feels tired. PCP gave  prednisone taper in April but had no real effect, finished last week. Breo no help and went back to Symbicort (covered by insurance) but thought she liked Dulera better in hosp. She had been anemic- hgb 10. CXR 12/03/16 IMPRESSION: COPD. Persistent right basilar pneumonia with small bilateral pleural effusions. No pneumothorax. Thoracic aortic atherosclerosis. CT chest 11/27/16 No evidence of pulmonary embolism. Bilateral lower lobe infiltrates right greater than left without sizable effusion. Lobular nodular density in the left upper lobe with some surrounding infiltrate. This may all be postinflammatory in nature. Non-contrast chest CT at 6-12 months is recommended. If the nodule is stable at time of repeat CT, then future CT at 18-24 months (from today's scan) is considered optional for low-risk patients, but is recommended for high-risk patients   Review of Systems-see HPI   + = pos Constitutional:   No-   weight loss, night sweats, fevers, chills, fatigue, lassitude. HEENT:   No-  headaches, difficulty swallowing, tooth/dental problems, sore throat,       No- sneezing, itching, no-ear ache,  nasal congestion, post nasal drip,  CV:  No- chest pain, orthopnea, PND, +swelling in lower extremities, anasarca, dizziness, palpitations.  Resp:    +shortness of breath with exertion or at rest.               + productive cough,  productive cough,  No-  coughing up of blood.              No-  change in color of mucus.   wheezing.   Skin: No-   rash or lesions. GI:  No-   heartburn, indigestion, abdominal pain, nausea, vomiting,  GU: MS:   neck pain w/o radicular symptoms Neuro-  Psych:  No- change in mood or affect. depression or anxiety.  No memory loss.   Objective:   Physical Exam  General- Alert,  Oriented, Affect+ anxious, Distress- none acute, conversational on O2,  Skin- + steroid ecchymoses on forearms Lymphadenopathy- none Head- atraumatic            Eyes- Gross vision  intact, PERRLA, conjunctivae clear secretions            Ears- Hearing, canals-normal            Nose-  no-Septal dev,  polyps, erosion, perforation. .             Throat- Mallampati II , mucosa clear , drainage- none, tonsils- atrophic.   Neck-  trachea midline, no stridor , thyroid nl, carotid no bruit.+ cervical kyphosis Chest - symmetrical excursion , unlabored           Heart/CV- RRR , no murmur , no gallop  , no rub, nl s1 s2                           - JVD- none , edema+trace, stasis changes- none, varices- none           Lung- distant , wheeze -none, cough-none , dullness-none, rub- none           Chest wall- + kyphosis Abd- Br/ Gen/ Rectal- Not done, not indicated Extrem- no cyanosis, edema or clubbing Neuro- grossly intact to observation

## 2017-01-25 NOTE — Patient Instructions (Addendum)
Sample x 2 and script sent for Symbicort 160     Inhale 2 puffs then rinse mouth, twice daily  [facility]  Order- lab- CBC w diff      Dx anemia  Order- CXR     Dx pneumonia RLL  Order- future CT chest no contrast in 5 months (Oct) LUL nodule

## 2017-01-26 ENCOUNTER — Telehealth: Payer: Self-pay | Admitting: Internal Medicine

## 2017-01-26 DIAGNOSIS — J441 Chronic obstructive pulmonary disease with (acute) exacerbation: Secondary | ICD-10-CM

## 2017-01-26 NOTE — Assessment & Plan Note (Signed)
CT chest 11/27/16-lobular density left upper lobe? Inflammatory. Plan-follow-up CT in 4-6 months.

## 2017-01-26 NOTE — Telephone Encounter (Signed)
Notes recorded by Waymon BudgeYoung, Clinton D, MD on 01/26/2017 at 10:39 AM EDT CXR- stable COPD. The pneumonia has completely resolved  Lab- WBC still elevated after prednisone and pneumonia, but coming down. Hemoglobin is now up to normal range.   Called and spoke with pt and she is aware of results per CY.  She stated that she is still having SOB and this has not resolved.  She is very concerned about this. She stated that she has been using her neb medication TID and she is needing a new rx sent in to [facility].   CY please advise. Thanks  No Known Allergies

## 2017-01-26 NOTE — Assessment & Plan Note (Signed)
Requalified today for oxygen 2.5 L continuous and portable

## 2017-01-26 NOTE — Telephone Encounter (Signed)
I called and spoke with the pt and notified of recs per CDY  She verbalized understanding  She was asking about her neb med and then I could not hear her  I had to hang up and call back and when I did there was NA and nop VM  [facility]

## 2017-01-26 NOTE — Assessment & Plan Note (Signed)
Anemic at previous check. Hemoglobin 10. We need to make sure it is coming up. She recognizes no blood loss.

## 2017-01-26 NOTE — Telephone Encounter (Signed)
I understand she is short of breath, and I am sorry. I don't have anything additional to do now. The xray and blood counts are improving and I believe she will gradually feel better. It can take a month sometimes to fully recover from the weakness and problems after a pneumonia. I don't know if the metoprolol given by cardiology to control heart rhythm is contributing to the weakness she feels.  If she gets worse, she may need to go to the ER.

## 2017-01-26 NOTE — Assessment & Plan Note (Signed)
Known severe COPD. We expect weakness and slow recovery after pneumonia. We will follow up CXR and CBC were watching to make sure that anemia resolves, x-ray clears. Recognize potential additional slowing/fatigue from beta blocker

## 2017-01-27 NOTE — Telephone Encounter (Signed)
Called pt back, states that she has been taking duoneb TID instead of the prescribed BID.  Pt gets this medication through [facility].  Requesting that CY change rx to TID so she will not run out of medications early.  CY please advise.  Thanks.

## 2017-01-28 ENCOUNTER — Telehealth: Payer: Self-pay | Admitting: Internal Medicine

## 2017-01-28 MED ORDER — IPRATROPIUM-ALBUTEROL 0.5-2.5 (3) MG/3ML IN SOLN: 3.0000 mL | Freq: Four times a day (QID) | RESPIRATORY_TRACT | 12 refills | Status: DC

## 2017-01-28 NOTE — Telephone Encounter (Signed)
Ok to write it for QID, = 120 ampules/ month, refill x 12

## 2017-01-28 NOTE — Telephone Encounter (Signed)
Spoke with pt and made her aware of CY's recommendation. Pt agreed and wanted her medication to got to [facility]. RX was printed and placed on CY's cart to sign. Will await his sig

## 2017-01-28 NOTE — Telephone Encounter (Signed)
Spoke with Jenel LucksKylie and Shanda BumpsJessica who need clarification as to how pt is instructed to take medication. Verified what Cy's instructions were from previous message. They understood and had no further questions. Nothing further is needed

## 2017-01-28 NOTE — Telephone Encounter (Signed)
RX was signed and given to [place]. Nothing further is needed

## 2017-01-29 ENCOUNTER — Telehealth: Payer: Self-pay | Admitting: Cardiology

## 2017-01-29 NOTE — Telephone Encounter (Signed)
Patient reports HTN (see below) and SOB on exertion. She reports her SOB has worsened since she was last seen in the office. She also reports mild swelling in her feet. Instructed patient to take medications as directed since BP is controlled today. Scheduled patient for evaluation Monday, 5/14 with L. Ingold to address swelling, HTN and SOB. Patient understands to seek medical attention if SOB worsens over the weekend. She was grateful for assistance.

## 2017-01-29 NOTE — Telephone Encounter (Signed)
New message     Pt c/o BP issue: STAT if pt c/o blurred vision, one-sided weakness or slurred speech  1. What are your last 5 BP readings?    4/30 189/76 5/1 161/65  5/2 183/74 5/3 157/67 5/4 136/59  5/5 166/77  5/6 132/91 5/7 118/70 5/8 158/80 5/9 198/79 5/10 155/75 5/11 144/58

## 2017-02-01 ENCOUNTER — Encounter: Payer: Self-pay | Admitting: Cardiology

## 2017-02-01 ENCOUNTER — Ambulatory Visit (INDEPENDENT_AMBULATORY_CARE_PROVIDER_SITE_OTHER): Payer: Medicare HMO | Admitting: Cardiology

## 2017-02-01 VITALS — BP 152/78 | HR 74 | Ht 61.0 in | Wt 116.8 lb

## 2017-02-01 DIAGNOSIS — R0602 Shortness of breath: Secondary | ICD-10-CM | POA: Diagnosis not present

## 2017-02-01 DIAGNOSIS — R0603 Acute respiratory distress: Secondary | ICD-10-CM | POA: Diagnosis not present

## 2017-02-01 DIAGNOSIS — J449 Chronic obstructive pulmonary disease, unspecified: Secondary | ICD-10-CM | POA: Diagnosis not present

## 2017-02-01 DIAGNOSIS — I10 Essential (primary) hypertension: Secondary | ICD-10-CM

## 2017-02-01 DIAGNOSIS — I480 Paroxysmal atrial fibrillation: Secondary | ICD-10-CM

## 2017-02-01 DIAGNOSIS — J441 Chronic obstructive pulmonary disease with (acute) exacerbation: Secondary | ICD-10-CM

## 2017-02-01 MED ORDER — METOPROLOL SUCCINATE ER 25 MG PO TB24: 75.0000 mg | ORAL_TABLET | Freq: Every day | ORAL | 2 refills | Status: AC

## 2017-02-01 NOTE — Progress Notes (Signed)
Cardiology Office Note   Date:  02/01/2017   ID:  [name], DOB April 16, 1946, MRN [id number]  PCP:  Eartha InchBadger, Michael C, MD  Cardiologist:  Dr. Mayford Knifeurner     Chief Complaint  Patient presents with  . Hypertension  . Leg Swelling      History of Present Illness: [name] is a 71 y.o. female who presents for HTN and DOE and some swelling in feet.    She has a history of previous heavy tobacco abuse (quit 2016), COPD on 2.5L home O2, HTN, and macular degeneration.  In March of this year whe was admitted with sepsis PNA, AECOPD, troponin pk at 1.19.  Once improved she had a nuc study which was a low risk study.  Normal perfusion and and normal LVEF>  EF by echo 55-60%.  It was decided this was demand ischemia.   She did have runs of PAF though she did convert to SR prior to discharge. Her CHA2DS2 VASc score was calculated at 4 (HTN, age, vasc dz (noted on CT scan), Fsex).  Placed on Eliqis 5 mg BID.  She was on Bystolic for rate control.  Post hospital she remained in SR.    In April she was seen by PCP for AECOPD she was prescribed steroids.  It also appears bystolic was changed to metoprolol.  She is followed by Dr. Maple HudsonYoung for lung nodule.  FU CXR 01/25/17 with resolution of RLL infiltrate.  +COPD.   Today.she continues with SOB worse in AM when she begins moving around, so SOB she can hardly do anything.  No chest pain except for sharp fleeting pain.  She is maintaining SR and no awareness of rapid HR.  Her BP is still elevated.    Past Medical History:  Diagnosis Date  . Chronic airway obstruction, not elsewhere classified   . Glaucoma   . H/O chronic bronchitis   . Hypertension   . Macular degeneration   . Pneumonia   . Shortness of breath 10/08/11   "all the time last 1 1/2 wk"  . Ventilator dependent [facility](HCC) 2004; 2008   "for mucous plugs"    Past Surgical History:  Procedure Laterality Date  . DILATION AND CURETTAGE OF UTERUS  1970's  . OOPHORECTOMY  1986  .  VAGINAL HYSTERECTOMY  1978   partial     Current Outpatient Prescriptions  Medication Sig Dispense Refill  . albuterol (PROVENTIL HFA;VENTOLIN HFA) 108 (90 Base) MCG/ACT inhaler Inhale 2 puffs into the lungs every 6 (six) hours as needed for wheezing or shortness of breath. Please dispense generic. 1 Inhaler 2  . ALPRAZolam (XANAX) 0.5 MG tablet TAKE 1 TABLET THREE TIMES DAILY AS NEEDED FOR ANXIETY 270 tablet 1  . apixaban (ELIQUIS) 5 MG TABS tablet Take 1 tablet (5 mg total) by mouth 2 (two) times daily. 180 tablet 3  . budesonide-formoterol (SYMBICORT) 160-4.5 MCG/ACT inhaler Inhale 2 puffs into the lungs 2 (two) times daily. 3 Inhaler 3  . CALCIUM PO Take 1 tablet by mouth daily.    Marland Kitchen. guaiFENesin-dextromethorphan (ROBITUSSIN DM) 100-10 MG/5ML syrup Take 10 mLs by mouth every 4 (four) hours as needed for cough. 118 mL 0  . ipratropium-albuterol (DUONEB) 0.5-2.5 (3) MG/3ML SOLN Take 3 mLs by nebulization 4 (four) times daily. Use one vial via nebulizer 2 times daily 360 mL 12  . ipratropium-albuterol (DUONEB) 0.5-2.5 (3) MG/3ML SOLN Take 3 mLs by nebulization 3 (three) times daily.    Marland Kitchen. lisinopril (PRINIVIL,ZESTRIL) 20  MG tablet Take 20 mg by mouth daily.    . metoprolol succinate (TOPROL-XL) 50 MG 24 hr tablet Take 1 tablet (50 mg total) by mouth daily. Take with or immediately following a meal. 90 tablet 3  . PARoxetine (PAXIL) 10 MG tablet Take 1 tablet (10 mg total) by mouth daily. 30 tablet 0  . predniSONE (DELTASONE) 10 MG tablet Take 10 mg by mouth daily with breakfast.     No current facility-administered medications for this visit.     Allergies:   Patient has no known allergies.    Social History:  The patient  reports that she quit smoking about 19 months ago. Her smoking use included Cigarettes. She has a 20.00 pack-year smoking history. She quit smokeless tobacco use about 23 months ago. She reports that she drinks about 4.2 oz of alcohol per week . She reports that she does  not use drugs.   Family History:  The patient's family history includes Emphysema in her father; Liver cancer in her mother.    ROS:  General:no colds or fevers, no weight changes Skin:no rashes or ulcers, + bruise on Lt shin laterally  HEENT:no blurred vision, no congestion CV:see HPI PUL:see HPI GI:no diarrhea constipation or melena, no indigestion GU:no hematuria, no dysuria MS:no joint pain, no claudication Neuro:no syncope, no lightheadedness Endo:no diabetes, no thyroid disease  Wt Readings from Last 3 Encounters:  02/01/17 116 lb 12.8 oz (53 kg)  01/25/17 115 lb (52.2 kg)  12/17/16 117 lb (53.1 kg)     PHYSICAL EXAM: VS:  BP (!) 152/78   Pulse 74   Ht 5\' 1"  (1.549 m)   Wt 116 lb 12.8 oz (53 kg)   BMI 22.07 kg/m  , BMI Body mass index is 22.07 kg/m. General:Pleasant affect, NAD Skin:Warm and dry, brisk capillary refill HEENT:normocephalic, sclera clear, mucus membranes moist Neck:supple, no JVD, no bruits  Heart:S1S2 RRR without murmur, gallup, rub or click Lungs:clear without rales, rhonchi, or wheezes ONG:EXBM, non tender, + BS, do not palpate liver spleen or masses Ext:no to trace lower ext edema, 2+ pedal pulses, 2+ radial pulses Neuro:alert and oriented X 3, MAE, follows commands, + facial symmetry  On oxygen 24/7  EKG:  EKG is ordered today. The ekg ordered today demonstrates SR normal EKG.     Recent Labs: 11/26/2016: B Natriuretic Peptide 55.7 12/03/2016: BUN 17; Creatinine, Ser 0.61; Potassium 4.1; Sodium 133 01/25/2017: Hemoglobin 12.7; Platelets 309.0    Lipid Panel    Component Value Date/Time   CHOL (H) 04/07/2007 0430    222        ATP III CLASSIFICATION:  <200     mg/dL   Desirable  841-324  mg/dL   Borderline High  >=401    mg/dL   High   TRIG 027 (H) 25/36/6440 0430   HDL 87 04/07/2007 0430   CHOLHDL 2.6 04/07/2007 0430   VLDL 39 04/07/2007 0430   LDLCALC  04/07/2007 0430    96        Total Cholesterol/HDL:CHD Risk Coronary Heart  Disease Risk Table                     Men   Women  1/2 Average Risk   3.4   3.3       Other studies Reviewed: Additional studies/ records that were reviewed today include: . Echo 11/27/16 Study Conclusions  - Left ventricle: The cavity size was normal. Systolic function was   normal.  The estimated ejection fraction was in the range of 55%   to 60%. Wall motion was normal; there were no regional wall   motion abnormalities. Left ventricular diastolic function   parameters were normal. - Aortic valve: Trileaflet; mildly thickened, mildly calcified   leaflets. Valve mobility was restricted. There was very mild   stenosis. There was moderate regurgitation. - Mitral valve: Transvalvular velocity was within the normal range.   There was no evidence for stenosis. There was no regurgitation. - Right ventricle: The cavity size was normal. Wall thickness was   normal. Systolic function was normal. - Atrial septum: No defect or patent foramen ovale was identified   by color flow Doppler. - Tricuspid valve: There was no regurgitation.   ASSESSMENT AND PLAN:  1.  HTN this may be contributing to her SOB, discussed with Dr. Mayford Knife and will increase toprol XL to 75 mg daily.  If SOB continues she will call us.  She has appt in June to see Dr. Mayford Knife.  Continue lisinopril  Will check BMP and BNP the SOB may be more of the COPE  2. COPD on home oxygen 24/7 is usuing albuterol nebs three times per day, at times it does not seem to help.    3. PAF maintaining SR   4. Anticoagulation on Eliquis without bleeding    Current medicines are reviewed with the patient today.  The patient Has no concerns regarding medicines.  The following changes have been made:  See above Labs/ tests ordered today include:see above  Disposition:   FU:  see above  Signed, Nada Boozer, NP  02/01/2017 2:22 PM    [facility] [address] [place], [place], [place]  [address]/ [address] [place], [place] Phone: [phone]; Fax: [phone]  [phone]

## 2017-02-01 NOTE — Patient Instructions (Signed)
Medication Instructions:  Your physician has recommended you make the following change in your medication:  1. Increase Toprol (75 mg ) daily   Labwork: Your physician recommends that you have lab work today: bmet/bnp   Testing/Procedures: -None  Follow-Up: Your physician recommends that you keep your scheduled follow-up appointment with Dr. Mayford Knifeurner.   Any Other Special Instructions Will Be Listed Below (If Applicable). If your bp is up call our office but if your lab work is elevated and pressure not up stated it was pulmonary.      If you need a refill on your cardiac medications before your next appointment, please call your pharmacy.

## 2017-02-02 ENCOUNTER — Telehealth: Payer: Self-pay | Admitting: *Deleted

## 2017-02-02 DIAGNOSIS — Z79899 Other long term (current) drug therapy: Secondary | ICD-10-CM

## 2017-02-02 LAB — BASIC METABOLIC PANEL
BUN/Creatinine Ratio: 22 (ref 12–28)
BUN: 13 mg/dL (ref 8–27)
CO2: 32 mmol/L — AB (ref 18–29)
CREATININE: 0.6 mg/dL (ref 0.57–1.00)
Calcium: 10.1 mg/dL (ref 8.7–10.3)
Chloride: 92 mmol/L — ABNORMAL LOW (ref 96–106)
GFR calc non Af Amer: 92 mL/min/{1.73_m2} (ref >59)
GFR, EST AFRICAN AMERICAN: 106 mL/min/{1.73_m2} (ref >59)
Glucose: 123 mg/dL — ABNORMAL HIGH (ref 65–99)
Potassium: 5.4 mmol/L — ABNORMAL HIGH (ref 3.5–5.2)
SODIUM: 137 mmol/L (ref 134–144)

## 2017-02-02 LAB — PRO B NATRIURETIC PEPTIDE: NT-PRO BNP: 354 pg/mL — AB (ref 0–301)

## 2017-02-02 MED ORDER — FUROSEMIDE 20 MG PO TABS: ORAL_TABLET | ORAL | 0 refills | Status: DC

## 2017-02-02 MED ORDER — FUROSEMIDE 20 MG PO TABS: ORAL_TABLET | ORAL | 3 refills | Status: DC

## 2017-02-02 NOTE — Telephone Encounter (Signed)
-----   Message from Leone BrandLaura R Ingold, NP sent at 02/02/2017  8:16 AM EDT ----- Decrease potassium rich foods, add lasix 20 mg daily, though for first 2 days take 2 pills daily to equal 40 mg to see if this will help with SOB.  Recheck BMET on Monday.

## 2017-02-02 NOTE — Addendum Note (Signed)
Addended by: Burnetta SabinWITTY, Brihanna Devenport K on: 02/02/2017 09:02 AM   Modules accepted: Orders

## 2017-02-03 ENCOUNTER — Telehealth: Payer: Self-pay | Admitting: Cardiology

## 2017-02-03 NOTE — Telephone Encounter (Signed)
Received a call from BromleyBeth, RN @ [facility]. She was wanting clarification on the Lasix. She was advised that we started it due to SOB and elevated BNP and Potassium.  She verbalized appreciation for the call and for clarifying for her.

## 2017-02-03 NOTE — Telephone Encounter (Signed)
New Message:    Waynetta SandyBeth says she need some clarification on pt's medicine,pt was seen a few days ago.

## 2017-02-04 ENCOUNTER — Telehealth: Payer: Self-pay | Admitting: *Deleted

## 2017-02-04 NOTE — Telephone Encounter (Signed)
PA for METOPROLOL SUCCINATE ER done through covermymeds

## 2017-02-05 ENCOUNTER — Telehealth: Payer: Self-pay

## 2017-02-05 NOTE — Telephone Encounter (Signed)
[facility] Metoprolol succ ER 25mg  . Authorization is good through 02/04/2018.

## 2017-02-08 ENCOUNTER — Other Ambulatory Visit: Payer: Medicare HMO

## 2017-02-08 DIAGNOSIS — Z79899 Other long term (current) drug therapy: Secondary | ICD-10-CM

## 2017-02-08 LAB — BASIC METABOLIC PANEL
BUN/Creatinine Ratio: 29 — ABNORMAL HIGH (ref 12–28)
BUN: 20 mg/dL (ref 8–27)
CO2: 33 mmol/L — ABNORMAL HIGH (ref 18–29)
Calcium: 11.1 mg/dL — ABNORMAL HIGH (ref 8.7–10.3)
Chloride: 87 mmol/L — ABNORMAL LOW (ref 96–106)
Creatinine, Ser: 0.68 mg/dL (ref 0.57–1.00)
GFR, EST AFRICAN AMERICAN: 102 mL/min/{1.73_m2} (ref >59)
GFR, EST NON AFRICAN AMERICAN: 88 mL/min/{1.73_m2} (ref >59)
Glucose: 102 mg/dL — ABNORMAL HIGH (ref 65–99)
POTASSIUM: 4.3 mmol/L (ref 3.5–5.2)
SODIUM: 136 mmol/L (ref 134–144)

## 2017-02-09 ENCOUNTER — Telehealth: Payer: Self-pay | Admitting: *Deleted

## 2017-02-09 MED ORDER — FUROSEMIDE 20 MG PO TABS: 20.0000 mg | ORAL_TABLET | ORAL | 0 refills | Status: DC

## 2017-02-09 NOTE — Telephone Encounter (Signed)
-----   Message from Leone BrandLaura R Ingold, NP sent at 02/08/2017  6:23 PM EDT ----- Is she better with the lasix?  Let's decrease lasix to every other day 20 mg and keep appt with Dr. Mayford Knifeurner

## 2017-02-16 ENCOUNTER — Telehealth: Payer: Self-pay | Admitting: Internal Medicine

## 2017-02-16 MED ORDER — PREDNISONE 10 MG PO TABS: 10.0000 mg | ORAL_TABLET | Freq: Every day | ORAL | 0 refills | Status: DC

## 2017-02-16 NOTE — Telephone Encounter (Signed)
I think she is on 10 mg daily prednisone. Suggest she try increasing that to 20 mg daily for now, until feeling better.

## 2017-02-16 NOTE — Telephone Encounter (Signed)
Sp[facility]Beth[facility], states that pt c/o worsening sob with any exertion- states it takes pt 15 minutes to catch breath after walking to front door from living room.  States that lungs sound clear but lung sounds are very diminished.  Denies any fever, mucus production, chest pain.  Waynetta SandyBeth is requesting further recs on pt's behalf.  Last ov: 01/25/17 Next ov: 06/07/17  CY please advise on further recs.  Thanks.

## 2017-02-16 NOTE — Telephone Encounter (Signed)
[name] returning call - she can be reached at [phone] -pr

## 2017-02-16 NOTE — Telephone Encounter (Signed)
Spoke with CY and he stated that he wants to start the pt back on the prednisone 10 mg daily.  #30.  Call and update on how she is doing in a week or so. Called and spoke with pt and she is aware. Nothing further is needed.

## 2017-02-16 NOTE — Telephone Encounter (Signed)
lmtcb x2  [facility]

## 2017-02-16 NOTE — Telephone Encounter (Signed)
lmtcb x3 for [name]

## 2017-02-16 NOTE — Telephone Encounter (Signed)
Beth returning call and I let her know that CY wanted pt to increase predinsone from 10 mg to 20 mg, beth seem to think that she was on some kiind of dose pack, which she thinks came from another dr, she wasn't really sure, she was going to call pt and wait on a call form us to be sure, please advise.Caren GriffinsStanley A Dalton

## 2017-02-16 NOTE — Telephone Encounter (Signed)
LM for Beth/[facility]

## 2017-02-16 NOTE — Telephone Encounter (Signed)
Beth called back and she stated that the pt was on the dosepak from may 11-18.  So the pt has not been on any daily prednisone since the 18 th when she finished the dosepak.  Pt is not currently taking the prednisone 10 mg daily.  CY please advise if you would like her to start on this med.  Thanks  Allergies  Allergen Reactions  . Bystolic [Nebivolol Hcl]     Was with ++ itching, improved after stopping UnitedHealthBystolic

## 2017-03-01 ENCOUNTER — Telehealth: Payer: Self-pay | Admitting: Internal Medicine

## 2017-03-01 NOTE — Telephone Encounter (Signed)
Spoke with the pt  She states that she has not noticed any improvement in her breathing since starting on pred  She states no worse, but no better  She wants to taper off of med (she is on 10 mg daily now) Wants a call back today  Please advise thanks!

## 2017-03-01 NOTE — Telephone Encounter (Signed)
Called and spoke with pt and she is aware of CY recs.  Nothing further is needed.  

## 2017-03-01 NOTE — Telephone Encounter (Signed)
Drop down to 1/2 tab = 5 mg daily x 7 days, then stop.

## 2017-03-12 ENCOUNTER — Encounter: Payer: Self-pay | Admitting: Cardiology

## 2017-03-12 ENCOUNTER — Ambulatory Visit (INDEPENDENT_AMBULATORY_CARE_PROVIDER_SITE_OTHER): Payer: Medicare HMO | Admitting: Cardiology

## 2017-03-12 VITALS — BP 106/68 | HR 88 | Ht 61.0 in | Wt 118.1 lb

## 2017-03-12 DIAGNOSIS — J449 Chronic obstructive pulmonary disease, unspecified: Secondary | ICD-10-CM

## 2017-03-12 DIAGNOSIS — I481 Persistent atrial fibrillation: Secondary | ICD-10-CM

## 2017-03-12 DIAGNOSIS — I10 Essential (primary) hypertension: Secondary | ICD-10-CM | POA: Diagnosis not present

## 2017-03-12 DIAGNOSIS — I4819 Other persistent atrial fibrillation: Secondary | ICD-10-CM

## 2017-03-12 NOTE — Patient Instructions (Signed)

## 2017-03-12 NOTE — Progress Notes (Signed)
Cardiology Office Note    Date:  03/12/2017   ID:  [name], DOB [birth year], MRN [id number]  PCP:  Eartha InchBadger, Michael C, MD  Cardiologist:  Armanda Magicraci Turner, MD   No chief complaint on file.   History of Present Illness:  [name] is a 71 y.o. female history of previous heavy tobacco abuse (quit 2016), COPD on 2.5L home O2, HTN, sepsis PNA with AECOPD with troponin pk at 1.19 with nuc study low risk and felt secondary to demand ischemia.   She has persistent atrial fibrillation. HerCHA2DS2 VASc score was calculated at 4 (HTN, age, vasc dz (noted on CT scan), Fsex) and is onon Eliquis 5 mg BID.    She is here today for followup and is doing well.  She says that her SOB is much worse today and she felt more wiped out today than usual. She says that she has good and bad days. She says that the heat really gets to her. She occasionally has some SOB when laying in the bed and gettting up to go to the bathroom.   She denies any chest pain or pressure, LE edema,  palpitations or syncope. She does have some balance problems with moving around but no falling.    Past Medical History:  Diagnosis Date  . Chronic airway obstruction, not elsewhere classified   . Glaucoma   . H/O chronic bronchitis   . Hypertension   . Macular degeneration   . Persistent atrial fibrillation (HCC)    CHADS2VSC score 4 on Eliquis  . Pneumonia   . Shortness of breath 10/08/11   "all the time last 1 1/2 wk"  . Ventilator dependent [facility](HCC) 2004; 2008   "for mucous plugs"    Past Surgical History:  Procedure Laterality Date  . DILATION AND CURETTAGE OF UTERUS  1970's  . OOPHORECTOMY  1986  . VAGINAL HYSTERECTOMY  1978   partial    Current Medications: Current Meds  Medication Sig  . ALPRAZolam (XANAX) 0.5 MG tablet TAKE 1 TABLET THREE TIMES DAILY AS NEEDED FOR ANXIETY  . apixaban (ELIQUIS) 5 MG TABS tablet Take 1 tablet (5 mg total) by mouth 2 (two) times daily.  . budesonide-formoterol  (SYMBICORT) 160-4.5 MCG/ACT inhaler Inhale 2 puffs into the lungs 2 (two) times daily.  Marland Kitchen. CALCIUM PO Take 1 tablet by mouth daily.  . furosemide (LASIX) 20 MG tablet Take 1 tablet (20 mg total) by mouth every other day.  . ipratropium-albuterol (DUONEB) 0.5-2.5 (3) MG/3ML SOLN Take 3 mLs by nebulization 4 (four) times daily. Use one vial via nebulizer 2 times daily  . lisinopril (PRINIVIL,ZESTRIL) 20 MG tablet Take 20 mg by mouth daily.  . metoprolol succinate (TOPROL-XL) 25 MG 24 hr tablet Take 3 tablets (75 mg total) by mouth daily. Take with or immediately following a meal.  . PARoxetine (PAXIL) 10 MG tablet Take 1 tablet (10 mg total) by mouth daily.  . predniSONE (DELTASONE) 10 MG tablet Take 1 tablet (10 mg total) by mouth daily with breakfast.    Allergies:   Bystolic [nebivolol hcl]   Social History   Social History  . Marital status: Married    Spouse name: N/A  . Number of children: N/A  . Years of education: N/A   Occupational History  . Retired    Social History Main Topics  . Smoking status: Former Smoker    Packs/day: 0.50    Years: 40.00    Types: Cigarettes    Quit  date: 06/22/2015  . Smokeless tobacco: Former Neurosurgeon    Quit date: 02/26/2015     Comment: 4-5 cigs a day - QUIT Oct 2016  . Alcohol use 4.2 oz/week    7 Cans of beer per week  . Drug use: No  . Sexual activity: No   Other Topics Concern  . None   Social History Narrative  . None     Family History:  The patient's family history includes Emphysema in her father; Liver cancer in her mother.   ROS:   Please see the history of present illness.    ROS All other systems reviewed and are negative.  No flowsheet data found.     PHYSICAL EXAM:   VS:  BP 106/68   Pulse 88   Ht 5\' 1"  (1.549 m)   Wt 118 lb 1.9 oz (53.6 kg)   SpO2 98%   BMI 22.32 kg/m    GEN: Well nourished, well developed, in no acute distress  HEENT: normal  Neck: no JVD, carotid bruits, or masses Cardiac: RRR; no murmurs,  rubs, or gallops,no edema.  Intact distal pulses bilaterally.  Respiratory:  clear to auscultation bilaterally, normal work of breathing GI: soft, nontender, nondistended, + BS MS: no deformity or atrophy  Skin: warm and dry, no rash Neuro:  Alert and Oriented x 3, Strength and sensation are intact Psych: euthymic mood, full affect  Wt Readings from Last 3 Encounters:  03/12/17 118 lb 1.9 oz (53.6 kg)  02/01/17 116 lb 12.8 oz (53 kg)  01/25/17 115 lb (52.2 kg)      Studies/Labs Reviewed:   EKG:  EKG is not ordered today.    Recent Labs: 11/26/2016: B Natriuretic Peptide 55.7 01/25/2017: Hemoglobin 12.7; Platelets 309.0 02/01/2017: NT-Pro BNP 354 02/08/2017: BUN 20; Creatinine, Ser 0.68; Potassium 4.3; Sodium 136   Lipid Panel    Component Value Date/Time   CHOL (H) 04/07/2007 0430    222        ATP III CLASSIFICATION:  <200     mg/dL   Desirable  161-096  mg/dL   Borderline High  >=045    mg/dL   High   TRIG 409 (H) 81/19/1478 0430   HDL 87 04/07/2007 0430   CHOLHDL 2.6 04/07/2007 0430   VLDL 39 04/07/2007 0430   LDLCALC  04/07/2007 0430    96        Total Cholesterol/HDL:CHD Risk Coronary Heart Disease Risk Table                     Men   Women  1/2 Average Risk   3.4   3.3    Additional studies/ records that were reviewed today include:  none    ASSESSMENT:    1. Persistent atrial fibrillation (HCC)   2. Essential hypertension   3. COPD GOLD III / 02 dep       PLAN:  In order of problems listed above:  1.  Persistent atrial fibrillation - she is maintaining NSR.  She will continue on Toprol XL 75mg  daily and Eliquis 5mg  BID.  CBC and creatinine are stable.  She has not had any bleeding problems.   2 . HTN - her BP is well controlled on exam today. She will continue on Lisinopril 20mg  daily and Toprol 75mg  daily.  3.  COPD on home oxygen 24/7 is usuing albuterol nebs three times per day.  She is followed by pulmonary     Medication Adjustments/Labs  and Tests Ordered: Current medicines are reviewed at length with the patient today.  Concerns regarding medicines are outlined above.  Medication changes, Labs and Tests ordered today are listed in the Patient Instructions below.  There are no Patient Instructions on file for this visit.   Signed, Armanda Magic, MD  03/12/2017 10:47 AM    [facility] [address] [place], [place], [place]  [address] Phone: [phone]; Fax: [phone]

## 2017-03-17 ENCOUNTER — Ambulatory Visit (INDEPENDENT_AMBULATORY_CARE_PROVIDER_SITE_OTHER)
Admission: RE | Admit: 2017-03-17 | Discharge: 2017-03-17 | Disposition: A | Discharge status: Home / Self Care | Payer: Medicare HMO | Source: Ambulatory Visit | Attending: Internal Medicine | Admitting: Internal Medicine

## 2017-03-17 ENCOUNTER — Telehealth: Payer: Self-pay | Admitting: Internal Medicine

## 2017-03-17 ENCOUNTER — Encounter: Payer: Self-pay | Admitting: Internal Medicine

## 2017-03-17 ENCOUNTER — Ambulatory Visit (INDEPENDENT_AMBULATORY_CARE_PROVIDER_SITE_OTHER): Payer: Medicare HMO | Admitting: Internal Medicine

## 2017-03-17 VITALS — BP 120/78 | HR 80 | Ht 61.0 in | Wt 116.8 lb

## 2017-03-17 DIAGNOSIS — J441 Chronic obstructive pulmonary disease with (acute) exacerbation: Secondary | ICD-10-CM

## 2017-03-17 MED ORDER — METHYLPREDNISOLONE ACETATE 80 MG/ML IJ SUSP
80.0000 mg | Freq: Once | INTRAMUSCULAR | Status: AC
  Administered 2017-03-17: 80 mg via INTRAMUSCULAR

## 2017-03-17 MED ORDER — LEVALBUTEROL HCL 0.63 MG/3ML IN NEBU
0.6300 mg | INHALATION_SOLUTION | Freq: Once | RESPIRATORY_TRACT | Status: AC
  Administered 2017-03-17: 0.63 mg via RESPIRATORY_TRACT

## 2017-03-17 MED ORDER — PREDNISONE 10 MG PO TABS: ORAL_TABLET | ORAL | 0 refills | Status: DC

## 2017-03-17 MED ORDER — CEPHALEXIN 500 MG PO CAPS: 500.0000 mg | ORAL_CAPSULE | Freq: Three times a day (TID) | ORAL | 0 refills | Status: DC

## 2017-03-17 NOTE — Telephone Encounter (Signed)
Pt called in wanting an appt. Pt is aware that CY is unavailable until Monday and she agreed to see another physician. Pt called in concerned because she has been having increase sob over the past serveral days. Pt states she gave herself a breathing treatment at 430am and then again at 930 am and she used her Symbicort but still is having trouble breathing She has been coughing but nothing is coming up, denies wheezing, chest tightness.   Pt was made an appt with MR today. She had no further questions at this time.

## 2017-03-17 NOTE — Addendum Note (Signed)
Addended by: Maxwell MarionBLANKENSHIP, MARGIE A on: 03/17/2017 05:32 PM   Modules accepted: Orders

## 2017-03-17 NOTE — Progress Notes (Signed)
Subjective:     Patient ID: [name], female   DOB: [birth year], 71 y.o.   MRN: [id number]  HPI    OV 03/17/2017  Chief Complaint  Patient presents with  . Acute Visit    Pt cannot breathe well at all on o2 at 2.5 liters cont'. Uses rescue inhalers 3-4 times daily more freq in last week. Pt has no fever no chills, but uses the neb treatments twice daily. Pt is having more SOB with O2, not sleeping at all at night due to gasping for air just to breath.     71 year old female with Gold D COPD patient with-4 L of oxygen at baseline. She has previous hypercarbia on ABG. Followed by Dr. Jetty Duhamel. She attends pulmonary rehabilitation. She is on Symbicort and DuoNeb. However she is not very clear with a history as to how frequently she does have DuoNeb. She tells me that she is significantly deteriorated in the last week with worsening shortness of breath and wheezing and lowered oxygen levels. There is no necessarily increased cough or sputum production but she does feel symptoms overall consistent with COPD exacerbation. She is frustrated by her quality of life both at baseline and current deterioration. She lives alone. Overall she feels she's never improved from a pneumonia admission in March 2018. She admits she might be deconditioned. She has had recurrent exacerbations  CT scan of the chest March 2018 with a left lower lobe 8mm lobulated nodule with a follow-up CT recommended in 6 months. Since then she's had a chest x-ray May 2018 that shows resolution of a right-sided infiltrate compared to March 2018  Arterial blood gas March 2018 with hypercapnia the exam 735/PCO2 57/156  Normal creatinine May 2018   Echocardiogram March 2018 with ejection fraction 65% and moderate aortic regurgitation    CAT COPD Symptom & Quality of Life Score (GSK trademark) 0 is no burden. 5 is highest burden 03/17/2017   Never Cough -> Cough all the time 2  No phlegm in chest -> Chest is full of  phlegm 1  No chest tightness -> Chest feels very tight 1  No dyspnea for 1 flight stairs/hill -> Very dyspneic for 1 flight of stairs 5  No limitations for ADL at home -> Very limited with ADL at home 4  Confident leaving home -> Not at all confident leaving home 2  Sleep soundly -> Do not sleep soundly because of lung condition 5  Lots of Energy -> No energy at all 4  TOTAL Score (max 40)  24        has a past medical history of Chronic airway obstruction, not elsewhere classified; Glaucoma; H/O chronic bronchitis; Hypertension; Macular degeneration; Persistent atrial fibrillation (HCC); Pneumonia; Shortness of breath (10/08/11); and Ventilator dependent (HCC) (2004; 2008).   reports that she quit smoking about 20 months ago. Her smoking use included Cigarettes. She has a 20.00 pack-year smoking history. She quit smokeless tobacco use about 2 years ago.  Past Surgical History:  Procedure Laterality Date  . DILATION AND CURETTAGE OF UTERUS  1970's  . OOPHORECTOMY  1986  . VAGINAL HYSTERECTOMY  1978   partial    Allergies  Allergen Reactions  . Bystolic [Nebivolol Hcl]     Was with ++ itching, improved after stopping Bystolic    Immunization History  Administered Date(s) Administered  . Influenza Split 06/23/2011, 07/01/2012, 07/03/2013, 06/26/2015  . Influenza Whole 06/10/2010  . Influenza, Seasonal, Injecte, Preservative Fre 06/16/2016  . Influenza,inj,Quad  PF,36+ Mos 07/19/2014  . Pneumococcal Conjugate-13 11/20/2014  . Pneumococcal Polysaccharide-23 11/27/2011    Family History  Problem Relation Age of Onset  . Liver cancer Mother   . Emphysema Father      Current Outpatient Prescriptions:  .  albuterol (PROVENTIL HFA;VENTOLIN HFA) 108 (90 Base) MCG/ACT inhaler, Inhale 2 puffs into the lungs every 6 (six) hours as needed for wheezing or shortness of breath. Please dispense generic., Disp: 1 Inhaler, Rfl: 2 .  ALPRAZolam (XANAX) 0.5 MG tablet, TAKE 1 TABLET THREE  TIMES DAILY AS NEEDED FOR ANXIETY, Disp: 270 tablet, Rfl: 1 .  apixaban (ELIQUIS) 5 MG TABS tablet, Take 1 tablet (5 mg total) by mouth 2 (two) times daily., Disp: 180 tablet, Rfl: 3 .  budesonide-formoterol (SYMBICORT) 160-4.5 MCG/ACT inhaler, Inhale 2 puffs into the lungs 2 (two) times daily., Disp: 3 Inhaler, Rfl: 3 .  CALCIUM PO, Take 1 tablet by mouth daily., Disp: , Rfl:  .  furosemide (LASIX) 20 MG tablet, Take 1 tablet (20 mg total) by mouth every other day., Disp: 32 tablet, Rfl: 0 .  guaiFENesin-dextromethorphan (ROBITUSSIN DM) 100-10 MG/5ML syrup, Take 10 mLs by mouth every 4 (four) hours as needed for cough., Disp: 118 mL, Rfl: 0 .  ipratropium-albuterol (DUONEB) 0.5-2.5 (3) MG/3ML SOLN, Take 3 mLs by nebulization 4 (four) times daily. Use one vial via nebulizer 2 times daily, Disp: 360 mL, Rfl: 12 .  lisinopril (PRINIVIL,ZESTRIL) 20 MG tablet, Take 20 mg by mouth daily., Disp: , Rfl:  .  metoprolol succinate (TOPROL-XL) 25 MG 24 hr tablet, Take 3 tablets (75 mg total) by mouth daily. Take with or immediately following a meal., Disp: 270 tablet, Rfl: 2 .  PARoxetine (PAXIL) 10 MG tablet, Take 1 tablet (10 mg total) by mouth daily., Disp: 30 tablet, Rfl: 0 .  predniSONE (DELTASONE) 10 MG tablet, Take 1 tablet (10 mg total) by mouth daily with breakfast., Disp: 30 tablet, Rfl: 0   Review of Systems     Objective:   Physical Exam  Constitutional: She is oriented to person, place, and time. She appears well-developed and well-nourished. No distress.  Deconditioned looking  HENT:  Head: Normocephalic and atraumatic.  Right Ear: External ear normal.  Left Ear: External ear normal.  Mouth/Throat: Oropharynx is clear and moist. No oropharyngeal exudate.  Eyes: Conjunctivae and EOM are normal. Pupils are equal, round, and reactive to light. Right eye exhibits no discharge. Left eye exhibits no discharge. No scleral icterus.  Neck: Normal range of motion. Neck supple. No JVD present. No  tracheal deviation present. No thyromegaly present.  Cardiovascular: Normal rate, regular rhythm, normal heart sounds and intact distal pulses.  Exam reveals no gallop and no friction rub.   No murmur heard. Pulmonary/Chest: Effort normal and breath sounds normal. No respiratory distress. She has no wheezes. She has no rales. She exhibits no tenderness.  o2 + barrell chest Purse lip breathing + Full sentences +  Abdominal: Soft. Bowel sounds are normal. She exhibits no distension and no mass. There is no tenderness. There is no rebound and no guarding.  Musculoskeletal: Normal range of motion. She exhibits no edema or tenderness.  Lymphadenopathy:    She has no cervical adenopathy.  Neurological: She is alert and oriented to person, place, and time. She has normal reflexes. No cranial nerve deficit. She exhibits normal muscle tone. Coordination normal.  Skin: Skin is warm and dry. No rash noted. She is not diaphoretic. No erythema. No pallor.  Psychiatric:  She has a normal mood and affect. Her behavior is normal. Judgment and thought content normal.  Vitals reviewed.   Vitals:   03/17/17 1558  BP: 120/78  Pulse: 80  SpO2: 94%  Weight: 116 lb 12.8 oz (53 kg)  Height: 5\' 1"  (1.549 m)    Estimated body mass index is 22.07 kg/m as calculated from the following:   Height as of this encounter: 5\' 1"  (1.549 m).   Weight as of this encounter: 116 lb 12.8 oz (53 kg).      Assessment:       ICD-10-CM   1. COPD with exacerbation (HCC) J44.1        Plan:      Severe   PLAN xopenex in office Depot medrol 80mg  IM in office Get cxr 2 view  Please take Take prednisone 40mg  once daily x 3 days, then 30mg  once daily x 3 days, then 20mg  once daily x 3 days, then prednisone 10mg  once daily  x 3 days and stop   Start cephalexin 500mg  three times daily x  5 days   Continue symbicort and duoneb as before  Talk to Dr Maple HudsonYoung about coming off lisinopril ; can make airways more  reactive  Followup Dr Maple HudsonYoung next few weeks or an APP    Dr. Kalman ShanMurali Daylani Deblois, M.D., [name]P Pulmonary and Critical Care Medicine Staff Physician [facility] Pager: [phone], If no answer or between  15:00h - 7:00h: call [phone]  03/17/2017 4:57 PM

## 2017-03-17 NOTE — Addendum Note (Signed)
Addended by: Maxwell MarionBLANKENSHIP, MARGIE A on: 03/17/2017 05:25 PM   Modules accepted: Orders

## 2017-03-17 NOTE — Patient Instructions (Addendum)
ICD-10-CM   1. COPD with exacerbation (HCC) J44.1    Severe   PLAN xopenex in office Depot medrol 80mg  IM in office Get cxr 2 view  Please take Take prednisone 40mg  once daily x 3 days, then 30mg  once daily x 3 days, then 20mg  once daily x 3 days, then prednisone 10mg  once daily  x 3 days and stop   Start cephalexin 500mg  three times daily x  5 days   Continue symbicort and duoneb as before  Talk to Dr Maple HudsonYoung about coming off lisinopril ; can make airways more reactive  Followup Dr Maple HudsonYoung next few weeks or an APP

## 2017-03-19 ENCOUNTER — Telehealth: Payer: Self-pay | Admitting: Internal Medicine

## 2017-03-19 NOTE — Telephone Encounter (Signed)
Spoke with patient-per her AVS-MR put note on it to change Lisinopril med since this can cause COPD to be more active.   Plan:      Severe   PLAN xopenex in office Depot medrol 80mg  IM in office Get cxr 2 view  Please take Take prednisone 40mg  once daily x 3 days, then 30mg  once daily x 3 days, then 20mg  once daily x 3 days, then prednisone 10mg  once daily  x 3 days and stop   Start cephalexin 500mg  three times daily x  5 days   Continue symbicort and duoneb as before  Talk to Dr Maple HudsonYoung about coming off lisinopril ; can make airways more reactive  Followup Dr Maple HudsonYoung next few weeks or an APP    Pt also states MW had taken her off of Lisinopril in 08-2016 and change to Diovan-pt could not tell any difference with being off Lisinopril and this med is cheaper.   Aware that CY is back on Monday 03-29-17 and will advise.   Dr. Kalman ShanMurali Ramaswamy, M.D., [facility].P Pulmonary and Critical Care Medicine Staff Physician [facility] Pager: [phone], If no answer or between  15:00h - 7:00h: call [phone]  03/17/2017 4:57 PM

## 2017-03-19 NOTE — Telephone Encounter (Signed)
lmomtcb x 1 for the pt 

## 2017-03-19 NOTE — Telephone Encounter (Signed)
Patient returned call, CB is [phone]

## 2017-03-21 ENCOUNTER — Emergency Department (HOSPITAL_COMMUNITY): Payer: Medicare HMO

## 2017-03-21 ENCOUNTER — Inpatient Hospital Stay (HOSPITAL_COMMUNITY)
Admission: EM | Admit: 2017-03-21 | Discharge: 2017-03-28 | DRG: 190 | Disposition: A | Discharge status: Home / Self Care | Payer: Medicare HMO | Attending: Internal Medicine | Admitting: Internal Medicine

## 2017-03-21 ENCOUNTER — Encounter (HOSPITAL_COMMUNITY): Payer: Self-pay | Admitting: Emergency Medicine

## 2017-03-21 DIAGNOSIS — I10 Essential (primary) hypertension: Secondary | ICD-10-CM | POA: Diagnosis present

## 2017-03-21 DIAGNOSIS — Z90710 Acquired absence of both cervix and uterus: Secondary | ICD-10-CM

## 2017-03-21 DIAGNOSIS — E871 Hypo-osmolality and hyponatremia: Secondary | ICD-10-CM | POA: Diagnosis present

## 2017-03-21 DIAGNOSIS — Z7901 Long term (current) use of anticoagulants: Secondary | ICD-10-CM

## 2017-03-21 DIAGNOSIS — H3530 Unspecified macular degeneration: Secondary | ICD-10-CM | POA: Diagnosis present

## 2017-03-21 DIAGNOSIS — R0602 Shortness of breath: Secondary | ICD-10-CM

## 2017-03-21 DIAGNOSIS — J439 Emphysema, unspecified: Secondary | ICD-10-CM | POA: Diagnosis not present

## 2017-03-21 DIAGNOSIS — Z9981 Dependence on supplemental oxygen: Secondary | ICD-10-CM

## 2017-03-21 DIAGNOSIS — Z66 Do not resuscitate: Secondary | ICD-10-CM | POA: Diagnosis not present

## 2017-03-21 DIAGNOSIS — Z7952 Long term (current) use of systemic steroids: Secondary | ICD-10-CM

## 2017-03-21 DIAGNOSIS — Z7189 Other specified counseling: Secondary | ICD-10-CM

## 2017-03-21 DIAGNOSIS — Z888 Allergy status to other drugs, medicaments and biological substances status: Secondary | ICD-10-CM

## 2017-03-21 DIAGNOSIS — D72829 Elevated white blood cell count, unspecified: Secondary | ICD-10-CM

## 2017-03-21 DIAGNOSIS — F419 Anxiety disorder, unspecified: Secondary | ICD-10-CM | POA: Diagnosis present

## 2017-03-21 DIAGNOSIS — J9621 Acute and chronic respiratory failure with hypoxia: Secondary | ICD-10-CM | POA: Diagnosis present

## 2017-03-21 DIAGNOSIS — I481 Persistent atrial fibrillation: Secondary | ICD-10-CM | POA: Diagnosis present

## 2017-03-21 DIAGNOSIS — J441 Chronic obstructive pulmonary disease with (acute) exacerbation: Secondary | ICD-10-CM | POA: Diagnosis present

## 2017-03-21 DIAGNOSIS — Z825 Family history of asthma and other chronic lower respiratory diseases: Secondary | ICD-10-CM

## 2017-03-21 DIAGNOSIS — Z87891 Personal history of nicotine dependence: Secondary | ICD-10-CM

## 2017-03-21 DIAGNOSIS — Z515 Encounter for palliative care: Secondary | ICD-10-CM

## 2017-03-21 DIAGNOSIS — Z800 Family history of malignant neoplasm of digestive organs: Secondary | ICD-10-CM

## 2017-03-21 DIAGNOSIS — D692 Other nonthrombocytopenic purpura: Secondary | ICD-10-CM | POA: Diagnosis present

## 2017-03-21 LAB — COMPREHENSIVE METABOLIC PANEL
ALBUMIN: 4.2 g/dL (ref 3.5–5.0)
ALT: 16 U/L (ref 14–54)
AST: 25 U/L (ref 15–41)
Alkaline Phosphatase: 79 U/L (ref 38–126)
Anion gap: 10 (ref 5–15)
BUN: 7 mg/dL (ref 6–20)
CHLORIDE: 86 mmol/L — AB (ref 101–111)
CO2: 34 mmol/L — AB (ref 22–32)
CREATININE: 0.53 mg/dL (ref 0.44–1.00)
Calcium: 9.8 mg/dL (ref 8.9–10.3)
GFR calc non Af Amer: >60 mL/min (ref >60)
GLUCOSE: 122 mg/dL — AB (ref 65–99)
Potassium: 3.9 mmol/L (ref 3.5–5.1)
SODIUM: 130 mmol/L — AB (ref 135–145)
Total Bilirubin: 0.6 mg/dL (ref 0.3–1.2)
Total Protein: 7.9 g/dL (ref 6.5–8.1)

## 2017-03-21 LAB — CBC WITH DIFFERENTIAL/PLATELET
BASOS ABS: 0 10*3/uL (ref 0.0–0.1)
BASOS PCT: 0 %
EOS ABS: 0.1 10*3/uL (ref 0.0–0.7)
EOS PCT: 0 %
HCT: 39.5 % (ref 36.0–46.0)
Hemoglobin: 12.9 g/dL (ref 12.0–15.0)
LYMPHS ABS: 3 10*3/uL (ref 0.7–4.0)
Lymphocytes Relative: 13 %
MCH: 28.5 pg (ref 26.0–34.0)
MCHC: 32.7 g/dL (ref 30.0–36.0)
MCV: 87.4 fL (ref 78.0–100.0)
Monocytes Absolute: 2.7 10*3/uL — ABNORMAL HIGH (ref 0.1–1.0)
Monocytes Relative: 12 %
NEUTROS PCT: 75 %
Neutro Abs: 17.7 10*3/uL — ABNORMAL HIGH (ref 1.7–7.7)
PLATELETS: 329 10*3/uL (ref 150–400)
RBC: 4.52 MIL/uL (ref 3.87–5.11)
RDW: 12.4 % (ref 11.5–15.5)
WBC: 23.5 10*3/uL — AB (ref 4.0–10.5)

## 2017-03-21 MED ORDER — METOPROLOL SUCCINATE ER 50 MG PO TB24
75.0000 mg | ORAL_TABLET | Freq: Every day | ORAL | Status: DC
  Administered 2017-03-21 – 2017-03-28 (×8): 75 mg via ORAL
  Filled 2017-03-21 (×8): qty 1

## 2017-03-21 MED ORDER — MOMETASONE FURO-FORMOTEROL FUM 200-5 MCG/ACT IN AERO
2.0000 | INHALATION_SPRAY | Freq: Two times a day (BID) | RESPIRATORY_TRACT | Status: DC
  Administered 2017-03-21 – 2017-03-27 (×12): 2 via RESPIRATORY_TRACT
  Filled 2017-03-21 (×2): qty 8.8

## 2017-03-21 MED ORDER — METHYLPREDNISOLONE SODIUM SUCC 40 MG IJ SOLR
40.0000 mg | Freq: Two times a day (BID) | INTRAMUSCULAR | Status: DC
  Administered 2017-03-21 – 2017-03-25 (×8): 40 mg via INTRAVENOUS
  Filled 2017-03-21 (×8): qty 1

## 2017-03-21 MED ORDER — METHYLPREDNISOLONE SODIUM SUCC 125 MG IJ SOLR
125.0000 mg | Freq: Once | INTRAMUSCULAR | Status: AC
  Administered 2017-03-21: 125 mg via INTRAVENOUS
  Filled 2017-03-21: qty 2

## 2017-03-21 MED ORDER — APIXABAN 5 MG PO TABS
5.0000 mg | ORAL_TABLET | Freq: Two times a day (BID) | ORAL | Status: DC
  Administered 2017-03-21 – 2017-03-27 (×13): 5 mg via ORAL
  Filled 2017-03-21 (×13): qty 1

## 2017-03-21 MED ORDER — AZITHROMYCIN 500 MG PO TABS
250.0000 mg | ORAL_TABLET | Freq: Every day | ORAL | Status: AC
  Administered 2017-03-22 – 2017-03-25 (×4): 250 mg via ORAL
  Filled 2017-03-21 (×4): qty 1

## 2017-03-21 MED ORDER — ALPRAZOLAM 0.5 MG PO TABS
0.5000 mg | ORAL_TABLET | Freq: Three times a day (TID) | ORAL | Status: DC | PRN
  Administered 2017-03-21 – 2017-03-23 (×3): 0.5 mg via ORAL
  Filled 2017-03-21 (×3): qty 1

## 2017-03-21 MED ORDER — PAROXETINE HCL 20 MG PO TABS
10.0000 mg | ORAL_TABLET | Freq: Every day | ORAL | Status: DC
  Administered 2017-03-21 – 2017-03-23 (×3): 10 mg via ORAL
  Filled 2017-03-21 (×3): qty 1

## 2017-03-21 MED ORDER — IPRATROPIUM-ALBUTEROL 0.5-2.5 (3) MG/3ML IN SOLN
3.0000 mL | Freq: Four times a day (QID) | RESPIRATORY_TRACT | Status: DC
  Administered 2017-03-21 – 2017-03-26 (×20): 3 mL via RESPIRATORY_TRACT
  Filled 2017-03-21 (×20): qty 3

## 2017-03-21 MED ORDER — ORAL CARE MOUTH RINSE
15.0000 mL | Freq: Two times a day (BID) | OROMUCOSAL | Status: DC
  Administered 2017-03-21 – 2017-03-28 (×15): 15 mL via OROMUCOSAL

## 2017-03-21 MED ORDER — AZITHROMYCIN 500 MG PO TABS
500.0000 mg | ORAL_TABLET | Freq: Once | ORAL | Status: AC
  Administered 2017-03-21: 500 mg via ORAL
  Filled 2017-03-21: qty 1

## 2017-03-21 MED ORDER — IPRATROPIUM-ALBUTEROL 0.5-2.5 (3) MG/3ML IN SOLN
3.0000 mL | Freq: Once | RESPIRATORY_TRACT | Status: AC
  Administered 2017-03-21: 3 mL via RESPIRATORY_TRACT
  Filled 2017-03-21: qty 3

## 2017-03-21 MED ORDER — ALBUTEROL SULFATE (2.5 MG/3ML) 0.083% IN NEBU
2.5000 mg | INHALATION_SOLUTION | RESPIRATORY_TRACT | Status: DC | PRN
  Administered 2017-03-21 – 2017-03-26 (×6): 2.5 mg via RESPIRATORY_TRACT
  Filled 2017-03-21 (×7): qty 3

## 2017-03-21 MED ORDER — GUAIFENESIN-DM 100-10 MG/5ML PO SYRP
10.0000 mL | ORAL_SOLUTION | ORAL | Status: DC | PRN
  Administered 2017-03-21: 10 mL via ORAL
  Filled 2017-03-21: qty 10

## 2017-03-21 NOTE — H&P (Signed)
Date: 03/21/2017               Patient Name:  Kaitlyn MoralesFrances L Ibarra MRN: [id number]  DOB: [birth year] Age / Sex: 71 y.o., female   PCP: Eartha InchBadger, Michael C, MD         Medical Service: Internal Medicine Teaching Service         Attending Physician: Dr. Burns SpainButcher, Elizabeth A, MD    First Contact: Dr. Alinda MoneyMelvin Pager: [phone]  Second Contact: Dr. Dimple Caseyice Pager: [phone]       After Hours (After 5p/  First Contact Pager: [phone]  weekends / holidays): Second Contact Pager: [phone]   Chief Complaint: Worsening Shortness of Breath  History of Present Illness: 71 year old female with a history of COPD, Atrial Fibrillation and Hypertension presents with 1 week of increasing shortness of breath and cough with sputum production. She saw her pulmonologist on 6/27 and was diagnosed with a COPD exacerbation and prescribed a prednisone taper and Keflex, in addition to he home Symbicort and Duoneb. she continued to worsen while on this treatment, which led her to come to the Emergency Department. She describes coughing up thick white sputum. She states that she has had to increase the frequency of her nebulizations from twice a day to 4-6 times per day.  In the ED her oxygenation was 100% on her home oxygen level of 3L. CBC showed elevated WBC in the setting of recent prednisone taper. He EKG showed Sinus Tachycardia and her CXR showed   Meds:  Current Meds  Medication Sig   albuterol (PROVENTIL HFA;VENTOLIN HFA) 108 (90 Base) MCG/ACT inhaler Inhale 2 puffs into the lungs every 6 (six) hours as needed for wheezing or shortness of breath. Please dispense generic.   ALPRAZolam (XANAX) 0.5 MG tablet TAKE 1 TABLET THREE TIMES DAILY AS NEEDED FOR ANXIETY   apixaban (ELIQUIS) 5 MG TABS tablet Take 1 tablet (5 mg total) by mouth 2 (two) times daily.   budesonide-formoterol (SYMBICORT) 160-4.5 MCG/ACT inhaler Inhale 2 puffs into the lungs 2 (two) times daily.   CALCIUM PO Take 1 tablet by mouth daily.    furosemide (LASIX) 20 MG tablet Take 1 tablet (20 mg total) by mouth every other day.   guaiFENesin-dextromethorphan (ROBITUSSIN DM) 100-10 MG/5ML syrup Take 10 mLs by mouth every 4 (four) hours as needed for cough.   ipratropium-albuterol (DUONEB) 0.5-2.5 (3) MG/3ML SOLN Take 3 mLs by nebulization 4 (four) times daily. Use one vial via nebulizer 2 times daily   lisinopril (PRINIVIL,ZESTRIL) 20 MG tablet Take 20 mg by mouth daily.   metoprolol succinate (TOPROL-XL) 25 MG 24 hr tablet Take 3 tablets (75 mg total) by mouth daily. Take with or immediately following a meal.   PARoxetine (PAXIL) 10 MG tablet Take 1 tablet (10 mg total) by mouth daily.   predniSONE (DELTASONE) 10 MG tablet Take 1 tablet (10 mg total) by mouth daily with breakfast.   [DISCONTINUED] cephALEXin (KEFLEX) 500 MG capsule Take 1 capsule (500 mg total) by mouth 3 (three) times daily.   [DISCONTINUED] predniSONE (DELTASONE) 10 MG tablet 4 tabs x 3 days, 3 tabs x 3 days, 2 tabs x 3 days, 1 tab x 3 days then stop     Allergies: Allergies as of 03/21/2017 - Review Complete 03/21/2017  Allergen Reaction Noted   Bystolic [nebivolol hcl]  02/01/2017   Past Medical History:  Diagnosis Date   Chronic airway obstruction, not elsewhere classified    Glaucoma  H/O chronic bronchitis    Hypertension    Macular degeneration    Persistent atrial fibrillation (HCC)    CHADS2VSC score 4 on Eliquis   Pneumonia    Shortness of breath 10/08/11   "all the time last 1 1/2 wk"   Ventilator dependent (HCC) 2004; 2008   "for mucous plugs"    Family History: Family History  Problem Relation Age of Onset   Liver cancer Mother    Emphysema Father     Social History: Tobacco: Quit Smoking in 2016, 40-60 pack year history Alcohol: 2-3 beers/ week Ilicit Drugs: Denies ADLs: Able to care for herself but becomes tired quickly, has home care following an admission in March.  Review of Systems: A complete ROS was  negative except as per HPI.  Physical Exam: Blood pressure (!) 160/62, pulse 82, temperature 97.4 F (36.3 C), temperature source Oral, resp. rate (!) 22, weight 116 lb 13.5 oz (53 kg), SpO2 98 %. Physical Exam  Constitutional: She is oriented to person, place, and time. She appears well-developed and well-nourished.  HENT:  Head: Normocephalic.  Mouth/Throat: Oropharynx is clear and moist.  Eyes: Conjunctivae and EOM are normal.  Cardiovascular: Normal rate, regular rhythm and normal heart sounds.   Pulmonary/Chest: Breath sounds normal.  Mild distress with increased respiration rate  Abdominal: Soft. Bowel sounds are normal. She exhibits no distension. There is no tenderness.  Musculoskeletal: She exhibits no edema or deformity.  Neurological: She is alert and oriented to person, place, and time.  Skin: Skin is warm and dry.   EKG:  Sinus tachycardia Probable left atrial enlargement Baseline wander Artifact When compared with ECG of 11/27/2016, No significant change was found   CXR: IMPRESSION: Stable hyperinflation and chronic interstitial coarsening. No consolidation or effusion. 03/21/2017 04:05  Most Recent PFT in EMR   Assessment & Plan by Problem: Active Problems:   COPD exacerbation (HCC)  71 year old female with a history of Severe COPD, Atrial Fibrillation and Hypertension presents with 1 week of increasing shortness of breath and cough with sputum production.  Increased SOB and Sputum Production in the setting of Servere COPD and recent failed outpatient treatment for COPD exacerbation, likely 2/2 COPD exacerbation. Differential includes pneumonia (unlikely due to negative CXR). Patient does have an elevated WBC, but this is expected following her recentl prednisone taper. - Azithromycin - Duonebs q6H - Methylprednisolone 40mg  IV q12H - Dulera (mometasone-formoterol) - Albuterol neb q4H PRN - Robitussin q4H PRN  A-fib - Continue home Metoprolol succinate 75mg  -  Continue eliquis 5mg   Anxiety  - Continue home xanax and paroxitine  F/E/N: Regular Diet  DVT Prophylaxis: On Eliquis   Code Status: Full Code  Dispo: Admit patient to Observation with expected length of stay less than 2 midnights.  Signed: Ginette Otto, DO IM PGY-1 Pager: [phone]

## 2017-03-21 NOTE — ED Notes (Signed)
Report called to 6E 

## 2017-03-21 NOTE — ED Notes (Signed)
Pt helped onto bedpan.

## 2017-03-21 NOTE — Progress Notes (Signed)
Patient is a admit from ED to floor [address] 03, report given by Dennie MaizesJessica Easley RN. Patient on oxygen at 3 liters, vital signs stable. Will continue to monitor patient.

## 2017-03-21 NOTE — Progress Notes (Signed)
IV bleeding removed, Receiving IV solumedrol every 12 hours.

## 2017-03-21 NOTE — ED Provider Notes (Signed)
[facility]-EMERGENCY DEPT Provider Note   CSN: [id number] Arrival date & time: 03/21/17  4098  By signing my name below, I, Phillips Climes, attest that this documentation has been prepared under the direction and in the presence of Dione Booze, MD . Electronically Signed: Phillips Climes, Scribe. 03/21/2017. 4:02 AM.  History   Chief Complaint Chief Complaint  Patient presents with  . Shortness of Breath    COPD   HPI Comments [name] is a 71 y.o. female with a PMHx significant for severe COPD on 4L O2 at baseline, HTN and  persistent atrial fibrillation, who presents to the Emergency Department with complaints of gradually worsening dyspnea x1 wk.  Pt visited her pulmonologist on 03/17/2017, x4 days ago, dx with COPD exacerbation and placed her on a prednisone taper and cephalexin, in addition to her home medication of Symbicort and duoneb.  She reports increased coughing from her baseline, with white sputum production.  Ne fevers or chills.  No improvement in sx after breathing treatments at home.  Pt denies experiencing any other acute sx.  The history is provided by the patient and medical records. No language interpreter was used.    Past Medical History:  Diagnosis Date  . Chronic airway obstruction, not elsewhere classified   . Glaucoma   . H/O chronic bronchitis   . Hypertension   . Macular degeneration   . Persistent atrial fibrillation (HCC)    CHADS2VSC score 4 on Eliquis  . Pneumonia   . Shortness of breath 10/08/11   "all the time last 1 1/2 wk"  . Ventilator dependent [facility]) 2004; 2008   "for mucous plugs"    Patient Active Problem List   Diagnosis Date Noted  . Pneumonia 11/27/2016  . Hyperglycemia 11/27/2016  . Anemia 11/27/2016  . Persistent atrial fibrillation (HCC)   . Chronic respiratory failure with hypoxia and hypercapnia (HCC) 09/11/2016  . Acute on chronic respiratory failure with hypoxia (HCC) 11/25/2015  . Anxiety 11/25/2015  . Dependent edema  11/25/2015  . Tachycardia 11/25/2015  . Hyponatremia   . COPD with respiratory distress, acute (HCC)   . COPD with acute exacerbation (HCC)   . CAP (community acquired pneumonia) 03/13/2015  . Respiratory distress 03/13/2015  . Pain of left calf 03/16/2014  . Neck pain 02/12/2014  . Allergic conjunctivitis and rhinitis 10/04/2012  . Essential hypertension 03/21/2009  . Chronic respiratory failure with hypoxia (HCC) 10/14/2007  . CT, CHEST, ABNORMAL 10/14/2007  . Tobacco use disorder, severe, in sustained remission 09/12/2007  . COPD GOLD III / 02 dep  09/12/2007    Past Surgical History:  Procedure Laterality Date  . DILATION AND CURETTAGE OF UTERUS  1970's  . OOPHORECTOMY  1986  . VAGINAL HYSTERECTOMY  1978   partial    OB History    No data available       Home Medications    Prior to Admission medications   Medication Sig Start Date End Date Taking? Authorizing Provider  albuterol (PROVENTIL HFA;VENTOLIN HFA) 108 (90 Base) MCG/ACT inhaler Inhale 2 puffs into the lungs every 6 (six) hours as needed for wheezing or shortness of breath. Please dispense generic. 12/02/15   Leatha Gilding, MD  ALPRAZolam Prudy Feeler) 0.5 MG tablet TAKE 1 TABLET THREE TIMES DAILY AS NEEDED FOR ANXIETY 01/25/17   Young, Rennis Chris, MD  apixaban (ELIQUIS) 5 MG TABS tablet Take 1 tablet (5 mg total) by mouth 2 (two) times daily. 01/20/17   Quintella Reichert, MD  budesonide-formoterol (SYMBICORT) 160-4.5 MCG/ACT inhaler Inhale 2 puffs into the lungs 2 (two) times daily. 01/25/17   Jetty Duhamel D, MD  CALCIUM PO Take 1 tablet by mouth daily.    [provider]  cephALEXin (KEFLEX) 500 MG capsule Take 1 capsule (500 mg total) by mouth 3 (three) times daily. 03/17/17   Kalman Shan, MD  furosemide (LASIX) 20 MG tablet Take 1 tablet (20 mg total) by mouth every other day. 02/09/17   Leone Brand, NP  guaiFENesin-dextromethorphan (ROBITUSSIN DM) 100-10 MG/5ML syrup Take 10 mLs by mouth every 4  (four) hours as needed for cough. 12/03/16   Dorothea Ogle, MD  ipratropium-albuterol (DUONEB) 0.5-2.5 (3) MG/3ML SOLN Take 3 mLs by nebulization 4 (four) times daily. Use one vial via nebulizer 2 times daily 01/28/17   Jetty Duhamel D, MD  lisinopril (PRINIVIL,ZESTRIL) 20 MG tablet Take 20 mg by mouth daily.    [provider]  metoprolol succinate (TOPROL-XL) 25 MG 24 hr tablet Take 3 tablets (75 mg total) by mouth daily. Take with or immediately following a meal. 02/01/17 05/02/17  Leone Brand, NP  PARoxetine (PAXIL) 10 MG tablet Take 1 tablet (10 mg total) by mouth daily. 12/04/16   Dorothea Ogle, MD  predniSONE (DELTASONE) 10 MG tablet Take 1 tablet (10 mg total) by mouth daily with breakfast. 02/16/17   Waymon Budge, MD  predniSONE (DELTASONE) 10 MG tablet 4 tabs x 3 days, 3 tabs x 3 days, 2 tabs x 3 days, 1 tab x 3 days then stop 03/17/17   Kalman Shan, MD    Family History Family History  Problem Relation Age of Onset  . Liver cancer Mother   . Emphysema Father     Social History Social History  Substance Use Topics  . Smoking status: Former Smoker    Packs/day: 0.00    Types: Cigarettes    Quit date: 06/22/2015  . Smokeless tobacco: Former Neurosurgeon    Quit date: 02/26/2015     Comment: 4-5 cigs a day - QUIT Oct 2016  . Alcohol use Yes     Allergies   Bystolic [nebivolol hcl]   Review of Systems Review of Systems  Constitutional: Negative for chills and fever.  Respiratory: Positive for shortness of breath.   Cardiovascular: Negative for chest pain.  Gastrointestinal: Negative for abdominal pain, nausea and vomiting.  Musculoskeletal: Negative for arthralgias and myalgias.  Neurological: Negative for weakness and numbness.  All other systems reviewed and are negative.  Physical Exam Updated Vital Signs BP (!) 160/89 (BP Location: Right Arm)   Pulse (!) 107   Temp 98.9 F (37.2 C) (Axillary)   Resp (!) 26   SpO2 100%   Physical Exam    Constitutional: She is oriented to person, place, and time. She appears well-developed and well-nourished.  In mild respiratory distress using accessory muscles for respiration  HENT:  Head: Normocephalic and atraumatic.  Eyes: EOM are normal. Pupils are equal, round, and reactive to light.  Neck: Normal range of motion. Neck supple. No JVD present.  Cardiovascular: Normal rate, regular rhythm and normal heart sounds.   No murmur heard. Pulmonary/Chest: She exhibits no tenderness.  Decreased air flow diffusely.  Prolonged exhalation phase with slight wheezing.   Abdominal: Soft. Bowel sounds are normal. She exhibits no distension and no mass. There is no tenderness.  Musculoskeletal: Normal range of motion. She exhibits no edema.  Lymphadenopathy:    She has no cervical adenopathy.  Neurological: She is alert and oriented to person, place, and time. No cranial nerve deficit. She exhibits normal muscle tone. Coordination normal.  Skin: Skin is warm and dry. No rash noted.  Psychiatric: She has a normal mood and affect. Her behavior is normal. Judgment and thought content normal.  Nursing note and vitals reviewed.  ED Treatments / Results  DIAGNOSTIC STUDIES: Oxygen Saturation is 100% using oxygen mask, pt's baseline.   COORDINATION OF CARE: 3:53 AM Discussed treatment plan with pt at bedside and pt agreed to plan.  Labs (all labs ordered are listed, but only abnormal results are displayed) Labs Reviewed  CBC WITH DIFFERENTIAL/PLATELET - Abnormal; Notable for the following:       Result Value   WBC 23.5 (*)    Neutro Abs 17.7 (*)    Monocytes Absolute 2.7 (*)    All other components within normal limits  COMPREHENSIVE METABOLIC PANEL - Abnormal; Notable for the following:    Sodium 130 (*)    Chloride 86 (*)    CO2 34 (*)    Glucose, Bld 122 (*)    All other components within normal limits    EKG  EKG Interpretation  Date/Time:  Sunday March 21 2017 03:48:04  EDT Ventricular Rate:  108 PR Interval:    QRS Duration: 99 QT Interval:  295 QTC Calculation: 396 R Axis:   79 Text Interpretation:  Sinus tachycardia Probable left atrial enlargement Baseline wander Artifact When compared with ECG of 11/27/2016, No significant change was found Confirmed by Dione Booze ([id number]) on 03/21/2017 3:56:32 AM      Radiology Dg Chest Portable 1 View  Result Date: 03/21/2017 CLINICAL DATA:  Cough for 2 hours EXAM: PORTABLE CHEST 1 VIEW COMPARISON:  03/17/2017 FINDINGS: Unchanged hyperinflation. Chronic interstitial coarsening. No consolidation. No effusion. No pneumothorax. IMPRESSION: Stable hyperinflation and chronic interstitial coarsening. No consolidation or effusion. Electronically Signed   By: Ellery Plunk M.D.   On: 03/21/2017 04:05    Procedures Procedures (including critical care time)  Medications Ordered in ED Medications  ipratropium-albuterol (DUONEB) 0.5-2.5 (3) MG/3ML nebulizer solution 3 mL (not administered)  methylPREDNISolone sodium succinate (SOLU-MEDROL) 125 mg/2 mL injection 125 mg (not administered)     Initial Impression / Assessment and Plan / ED Course  I have reviewed the triage vital signs and the nursing notes.  Pertinent labs & imaging results that were available during my care of the patient were reviewed by me and considered in my medical decision making (see chart for details).  COPD exacerbation. This is worrisome because she started several days and to a steroid burst with taper. Old records are reviewed, and she does have several hospital admissions for COPD, and-does have history of having to be intubated. She was given initial nebulizer treatment with minimal improvement. She is given dose of methylprednisolone. Second nebulizer treatment is given with further improvement. After third nebulizer treatment, no wheezing was detected but she was still tachypneic and dyspneic and using accessory muscles of respiration. It was  felt that she had failed outpatient management and decision was made to admit her. Laboratory workup was significant for mild hyponatremia, elevated CO2 which probably represents chronic respiratory acidosis with compensatory metabolic alkalosis. Leukocytosis is partly secondary to steroids, partly due to underlying health condition. Case is discussed with Dr. Dimple Casey of internal medicine teaching service who agrees to admit the patient.  Final Clinical Impressions(s) / ED Diagnoses   Final diagnoses:  COPD exacerbation (HCC)  Hyponatremia  Leukocytosis, unspecified  type    New Prescriptions New Prescriptions   No medications on file   I personally performed the services described in this documentation, which was scribed in my presence. The recorded information has been reviewed and is accurate.     Dione BoozeGlick, Keimya Briddell, MD 03/21/17 [phone]

## 2017-03-21 NOTE — ED Notes (Signed)
Breakfast tray ordered 

## 2017-03-21 NOTE — Discharge Instructions (Signed)

## 2017-03-21 NOTE — ED Notes (Signed)
Report attempted.  Nurse to call back.

## 2017-03-21 NOTE — ED Notes (Signed)
Nebulizer treatment in progress , breathing easier , O2 sat= 100% , IV site intact .

## 2017-03-21 NOTE — ED Triage Notes (Signed)
Patient arrived with EMS from home reports worsening SOB onset last week , currently taking prescription Prednisone with no relief , she received Duoneb treatment prior to arrival by EMS .

## 2017-03-21 NOTE — Plan of Care (Signed)
Problem: Activity: Goal: Risk for activity intolerance will decrease Outcome: Not Progressing SOB at Rest, BSC and [place] in place for activity intolerance.

## 2017-03-22 ENCOUNTER — Encounter (HOSPITAL_COMMUNITY): Payer: Self-pay

## 2017-03-22 ENCOUNTER — Inpatient Hospital Stay (HOSPITAL_COMMUNITY): Payer: Medicare HMO

## 2017-03-22 DIAGNOSIS — D692 Other nonthrombocytopenic purpura: Secondary | ICD-10-CM | POA: Diagnosis present

## 2017-03-22 DIAGNOSIS — I10 Essential (primary) hypertension: Secondary | ICD-10-CM | POA: Diagnosis present

## 2017-03-22 DIAGNOSIS — Z7189 Other specified counseling: Secondary | ICD-10-CM | POA: Diagnosis not present

## 2017-03-22 DIAGNOSIS — R0602 Shortness of breath: Secondary | ICD-10-CM | POA: Diagnosis not present

## 2017-03-22 DIAGNOSIS — I481 Persistent atrial fibrillation: Secondary | ICD-10-CM | POA: Diagnosis present

## 2017-03-22 DIAGNOSIS — Z7901 Long term (current) use of anticoagulants: Secondary | ICD-10-CM | POA: Diagnosis not present

## 2017-03-22 DIAGNOSIS — Z888 Allergy status to other drugs, medicaments and biological substances status: Secondary | ICD-10-CM | POA: Diagnosis not present

## 2017-03-22 DIAGNOSIS — Z515 Encounter for palliative care: Secondary | ICD-10-CM | POA: Diagnosis not present

## 2017-03-22 DIAGNOSIS — Z66 Do not resuscitate: Secondary | ICD-10-CM | POA: Diagnosis not present

## 2017-03-22 DIAGNOSIS — Z87891 Personal history of nicotine dependence: Secondary | ICD-10-CM | POA: Diagnosis not present

## 2017-03-22 DIAGNOSIS — D72829 Elevated white blood cell count, unspecified: Secondary | ICD-10-CM | POA: Diagnosis not present

## 2017-03-22 DIAGNOSIS — Z800 Family history of malignant neoplasm of digestive organs: Secondary | ICD-10-CM | POA: Diagnosis not present

## 2017-03-22 DIAGNOSIS — J9621 Acute and chronic respiratory failure with hypoxia: Secondary | ICD-10-CM | POA: Diagnosis present

## 2017-03-22 DIAGNOSIS — Z825 Family history of asthma and other chronic lower respiratory diseases: Secondary | ICD-10-CM | POA: Diagnosis not present

## 2017-03-22 DIAGNOSIS — J439 Emphysema, unspecified: Secondary | ICD-10-CM | POA: Diagnosis present

## 2017-03-22 DIAGNOSIS — Z9981 Dependence on supplemental oxygen: Secondary | ICD-10-CM | POA: Diagnosis not present

## 2017-03-22 DIAGNOSIS — Z90710 Acquired absence of both cervix and uterus: Secondary | ICD-10-CM | POA: Diagnosis not present

## 2017-03-22 DIAGNOSIS — J441 Chronic obstructive pulmonary disease with (acute) exacerbation: Secondary | ICD-10-CM | POA: Diagnosis not present

## 2017-03-22 DIAGNOSIS — F419 Anxiety disorder, unspecified: Secondary | ICD-10-CM | POA: Diagnosis present

## 2017-03-22 DIAGNOSIS — Z7952 Long term (current) use of systemic steroids: Secondary | ICD-10-CM | POA: Diagnosis not present

## 2017-03-22 DIAGNOSIS — E871 Hypo-osmolality and hyponatremia: Secondary | ICD-10-CM | POA: Diagnosis present

## 2017-03-22 DIAGNOSIS — H3530 Unspecified macular degeneration: Secondary | ICD-10-CM | POA: Diagnosis present

## 2017-03-22 LAB — CBC
HEMATOCRIT: 35.9 % — AB (ref 36.0–46.0)
Hemoglobin: 11.4 g/dL — ABNORMAL LOW (ref 12.0–15.0)
MCH: 27.8 pg (ref 26.0–34.0)
MCHC: 31.8 g/dL (ref 30.0–36.0)
MCV: 87.6 fL (ref 78.0–100.0)
Platelets: 340 10*3/uL (ref 150–400)
RBC: 4.1 MIL/uL (ref 3.87–5.11)
RDW: 12.4 % (ref 11.5–15.5)
WBC: 23.5 10*3/uL — ABNORMAL HIGH (ref 4.0–10.5)

## 2017-03-22 LAB — BASIC METABOLIC PANEL
Anion gap: 7 (ref 5–15)
BUN: 11 mg/dL (ref 6–20)
CALCIUM: 10 mg/dL (ref 8.9–10.3)
CO2: 38 mmol/L — AB (ref 22–32)
Chloride: 86 mmol/L — ABNORMAL LOW (ref 101–111)
Creatinine, Ser: 0.57 mg/dL (ref 0.44–1.00)
Glucose, Bld: 134 mg/dL — ABNORMAL HIGH (ref 65–99)
Potassium: 5.1 mmol/L (ref 3.5–5.1)
Sodium: 131 mmol/L — ABNORMAL LOW (ref 135–145)

## 2017-03-22 MED ORDER — MORPHINE SULFATE 10 MG/5ML PO SOLN: 5.0000 mg | ORAL | Status: DC | PRN

## 2017-03-22 MED ORDER — MORPHINE SULFATE 10 MG/5ML PO SOLN
5.0000 mg | ORAL | Status: DC | PRN
  Administered 2017-03-23 – 2017-03-26 (×8): 5 mg via ORAL
  Filled 2017-03-22 (×9): qty 4

## 2017-03-22 NOTE — Progress Notes (Signed)
Date: 03/22/2017  Patient name: [name]  Medical record number: [id number]  Date of birth: Oct 13, 1945   I have seen and evaluated [name] and discussed their care with the Residency Team. [name] is a 71 year old female who presented with chief complaint of worsening dyspnea. She has known COPD GOLD D and is on 3 L of oxygen at home. She saw her pulmonologist on June 27 and was diagnosed with a COPD exacerbation. She was prescribed Keflex and prednisone but did not have any improvement. Her symptoms in include increased cough, increased sputum production, increased dyspnea, and increased home nebulizer usage.  In the ED, her chest x-ray was not felt to the representative of a community-acquired pneumonia. Since she had failed outpatient treatment, she was admitted and continued on IV steroids, oral antibiotics, supplemental oxygen, and nebulizer treatment. This morning, she states that her dyspnea is severe and she does not feel like she can breathe in or out. She has not had much coughing overnight but did take Robitussin last night.  PMHx, Fam Hx, and/or Soc Hx : COPD GOLD D, O2 dependent. Fam hx + mother passed away with hospice from lung cancer. Soc : lives alone, drives, has medicare aid which will expire this week.  Vitals:   03/22/17 0426 03/22/17 0900  BP: (!) 157/56 (!) 133/48  Pulse: 76 65  Resp: 18 18  Temp: 97.6 F (36.4 C) 97.4 F (36.3 C)  high 90's - 100% O2 sat on home 3L O2 Thin woman, pale, sitting at about 80 degree angle Pul : Tachypnea, using accessory muscles, able to speak in full sentences, doesn't appear to be dyspneic, cannot appreciate hardly any air movement on lung examination therefore could not appreciate wheezing HRRR no MRG ABD + BS, S/NT Ext no edema, +2 DP pulses Skin : Senile purpura  Bicarb 38, steady increase since 2016 HgB 11.4  I personally viewed her CXR images and confirmed by reading with the official read. 2 view today :  Good quality, chronic scarring in the right base. No infiltrate. No thoracic compression fracture.  I personally viewed her EKG and confirmed by reading with the official read. Sinus rhythm, normal axis, poor baseline but no ischemic changes  Assessment and Plan: I have seen and evaluated the patient as outlined above. I agree with the formulated Assessment and Plan as detailed in the residents' note, with the following changes: [name] is a 70 year old woman with Gold D oxygen dependent COPD. This is her second hospitalization for a COPD exacerbation and less than 4 months. She failed outpatient treatment with steroids and oral antibiotics. The team today is quite concerned that this is a worsening of her chronic COPD without much of an acute reversible component. Her chest x-ray does not show any infiltrate and there is no wheezing on her examination although it would be hard to discern any wheezing due to the very poor air flow. She is oxygenating very well. Her symptoms are primarily the sensation of air hunger. We started discussing that her symptoms may not have a large reversible component and that we need to start focusing on symptom management and quality of life. She is understandably concerned that we are inferring that she is at the end of her life. I am concerned that her life expectancy is less than 6 months. We will continue to workup and treat any reversible component and therefore are continuing IV steroids and oral antibiotics along with her nebulizers and supplemental O2.  She was in agreement to a palliative care consult and trying low-dose oral morphine to see if that will help the air hunger. She is concerned about her ability to live home independently especially if she takes morphine. We started to broach this and I mentioned that Medicare only provides home health services for a very brief time but that hospice may be able to provide an aide. This discussion will need to continue  throughout her hospitalization and as an outpatient.  Dx :  1. Acute on chronic hypoxic respiratory failure 2. Atrial fibrillation 3. Senile purpura  PLAN : 1. Continue IV steroids, oral antibiotics, nebulizers, supplemental oxygen 2. Palliative care consult 3. Low-dose oral morphine for air hunger trial 4. PT/OT consult  Burns SpainButcher, Neil Errickson A, MD 7/2/201812:23 PM

## 2017-03-22 NOTE — Telephone Encounter (Signed)
Spoke with pt. She is aware of CY's recommendation. Nothing further was needed. 

## 2017-03-22 NOTE — Telephone Encounter (Signed)
CY please advise on pt's Lisinopril.  Thanks!  (please note that per pt's chart [facility])

## 2017-03-22 NOTE — Evaluation (Signed)
Physical Therapy Evaluation Patient Details Name: [name] MRN: [id number] DOB: Jun 04, 1946 Today's Date: 03/22/2017   History of Present Illness  Ms. Heuberger is a 71 year old female who presented with chief complaint of worsening dyspnea.  has a past medical history of Chronic airway obstruction, not elsewhere classified; Glaucoma; H/O chronic bronchitis; Hypertension; Macular degeneration; Persistent atrial fibrillation (HCC); Pneumonia; Shortness of breath (10/08/11); and Ventilator dependent (HCC) (2004; 2008).  Clinical Impression   Pt admitted with above diagnosis. Pt currently with functional limitations due to the deficits listed below (see PT Problem List).  Pt will benefit from skilled PT to increase their independence and safety with mobility to allow discharge to the venue listed below.    Overall moving and walking well, just limited by decr functional capacity; Session conducted on 3L O2 via [place]; O2 sats beginning of walk 96%, decr to 89% with walking; took approx 5 minutes of seated rest on 3 L for O2 sats to incr back to 95-96%   Follow Up Recommendations Other (comment) (Pulmonary Rehab)    Equipment Recommendations  None recommended by PT    Recommendations for Other Services       Precautions / Restrictions Precautions Precautions: Other (comment) Precaution Comments: Watch O2 sats      Mobility  Bed Mobility Overal bed mobility: Modified Independent             General bed mobility comments: Incr time  Transfers Overall transfer level: Needs assistance Equipment used: None Transfers: Sit to/from Stand Sit to Stand: Supervision         General transfer comment: Cues to self-monitor for actvity tolerance  Ambulation/Gait Ambulation/Gait assistance: Supervision Ambulation Distance (Feet): 100 Feet Assistive device: None Gait Pattern/deviations: Step-through pattern;Decreased stride length     General Gait Details: Cues to self-monitor for  activity tolerance  Stairs            Wheelchair Mobility    Modified Rankin (Stroke Patients Only)       Balance     Sitting balance-Leahy Scale: Normal       Standing balance-Leahy Scale: Good                               Pertinent Vitals/Pain Pain Assessment: No/denies pain    Home Living Family/patient expects to be discharged to:: Private residence Living Arrangements: Alone Available Help at Discharge: Friend(s);Family (minimal) Type of Home: House Home Access: Stairs to enter Entrance Stairs-Rails: Doctor, general practice of Steps: 3 Home Layout: One level Home Equipment: Environmental consultant - 2 wheels Additional Comments: some PRN help from adult children    Prior Function Level of Independence: Independent         Comments: "I take my time with things." Still driving     Hand Dominance        Extremity/Trunk Assessment   Upper Extremity Assessment Upper Extremity Assessment: Overall WFL for tasks assessed    Lower Extremity Assessment Lower Extremity Assessment: Overall WFL for tasks assessed       Communication   Communication: No difficulties  Cognition Arousal/Alertness: Awake/alert Behavior During Therapy: WFL for tasks assessed/performed;Anxious (due to difficulty breathing) Overall Cognitive Status: Within Functional Limits for tasks assessed                                        General Comments  Exercises     Assessment/Plan    PT Assessment Patient needs continued PT services  PT Problem List Decreased activity tolerance;Cardiopulmonary status limiting activity       PT Treatment Interventions DME instruction;Gait training;Stair training;Functional mobility training;Therapeutic activities;Therapeutic exercise;Patient/family education;Balance training    PT Goals (Current goals can be found in the Care Plan section)  Acute Rehab PT Goals Patient Stated Goal: did not state PT  Goal Formulation: With patient Time For Goal Achievement: 04/05/17 Potential to Achieve Goals: Good    Frequency Min 3X/week   Barriers to discharge Decreased caregiver support Minimal help from adult children    Co-evaluation               AM-PAC PT "6 Clicks" Daily Activity  Outcome Measure Difficulty turning over in bed (including adjusting bedclothes, sheets and blankets)?: None Difficulty moving from lying on back to sitting on the side of the bed? : None Difficulty sitting down on and standing up from a chair with arms (e.g., wheelchair, bedside commode, etc,.)?: None Help needed moving to and from a bed to chair (including a wheelchair)?: None Help needed walking in hospital room?: None Help needed climbing 3-5 steps with a railing? : A Little 6 Click Score: 23    End of Session Equipment Utilized During Treatment: Oxygen Activity Tolerance: Patient tolerated treatment well Patient left: in bed;with call bell/phone within reach Nurse Communication: Mobility status PT Visit Diagnosis: Other (comment) (Decr Functional Capacity)    Time: [id number] PT Time Calculation (min) (ACUTE ONLY): 27 min   Charges:   PT Evaluation $PT Eval Low Complexity: 1 Procedure PT Treatments $Gait Training: 8-22 mins   PT G Codes:        Van ClinesHolly Shalandra Leu, PT  Acute Rehabilitation Services Pager [phone] Office [phone]   Levi AlandHolly H Zavion Sleight 03/22/2017, 3:28 PM

## 2017-03-22 NOTE — Progress Notes (Signed)
° °  Subjective: [name] remained in mild distress this morning and complains of feeling as though she cannot get enough air despite adequate oxygen saturation. She states that she does not feel any better when compared to yesterday. The patient was concerned about her respiratory status and whether she would fully return to her previous baseline before her previous hospitalization in March. The team discussed with her the severity of her COPD and that it was unlikely she would return to the previous respiratory status. A paliative care consult was discussed with and explained to the patient to see if she would be willing to talk with palliative care about controlling the symptoms of her severe COPD. The patient agreed to speak with palliative care. She also agreed to a trial of low dose morphine for her air hunger.   Objective:  Vital signs in last 24 hours: Vitals:   03/22/17 0426 03/22/17 0836 03/22/17 0900 03/22/17 1206  BP: (!) 157/56  (!) 133/48   Pulse: 76  65   Resp: 18  18   Temp: 97.6 F (36.4 C)  97.4 F (36.3 C)   TempSrc: Oral  Oral   SpO2: 96% 96% 100% 98%  Weight: 114 lb 1.6 oz (51.8 kg)     Height:       Physical Exam  Constitutional: She appears well-developed and well-nourished.   - WBC remains elevated 2/2 steroids - Na 131 (130 on 7/1), Cl 86 (86 on 7/1) - CXR: COPD. Mild interstitial prominence likely reflects the patient's smoking history and there may be superimposed acute bronchitis. There is no alveolar pneumonia nor CHF. Thoracic aortic atherosclerosis.  Assessment/Plan:  Active Problems:   COPD exacerbation (HCC)  71 year old female with a history of Severe COPD, Atrial Fibrillation and Hypertension presents with 1 week of increasing shortness of breath and cough with sputum production.  Increased SOB and Sputum Production in the setting of Servere COPD and recent failed outpatient treatment for COPD exacerbation, likely 2/2 COPD  exacerbation. Differential includes pneumonia (unlikely due to negative CXR). Elevated WBC on admission expected following her recent prednisone taper. - No change in status from yesterday, Probable new - Palliative care consult to discuss options including symptom management - 5mg  Morphine Oral PRN q4H for SOB - Repeat CXR negative for PNA - PT/OT consult to evaluate   - Azithromycin - Duonebs q6H - Methylprednisolone 40mg  IV q12H - Dulera (mometasone-formoterol) - Albuterol neb q4H PRN - Robitussin q4H PRN  A-fib - Continue home Metoprolol succinate 75mg  - Continue eliquis 5mg   Anxiety  - Continue home xanax and paroxitine  F/E/N: Regular Diet  DVT Prophylaxis: On Eliquis   Code Status: Full Code  Dispo: Anticipated discharge in approximately 2-3 days.   Ginette OttoAlec Melvin, DO IM PGY-1 Pager: [phone]

## 2017-03-22 NOTE — Telephone Encounter (Signed)
I suggest change to diovan 40 mg, # 30, 1 daily instead of lisinopril. Diovan is less likely to aggravate her cough. She needs to clear this with the doctor who prescribes her lisinopril.

## 2017-03-23 DIAGNOSIS — Z7189 Other specified counseling: Secondary | ICD-10-CM

## 2017-03-23 DIAGNOSIS — Z515 Encounter for palliative care: Secondary | ICD-10-CM

## 2017-03-23 DIAGNOSIS — R0602 Shortness of breath: Secondary | ICD-10-CM

## 2017-03-23 MED ORDER — ESCITALOPRAM OXALATE 10 MG PO TABS
10.0000 mg | ORAL_TABLET | Freq: Every day | ORAL | Status: DC
  Administered 2017-03-24 – 2017-03-28 (×5): 10 mg via ORAL
  Filled 2017-03-23 (×5): qty 1

## 2017-03-23 NOTE — Consult Note (Signed)
Consultation Note Date: 03/23/2017   Patient Name: [name]  DOB: October 01, 1945  MRN: [id number]  Age / Sex: 71 y.o., female  PCP: [name], MD Referring Physician: Burns Spain, MD  Reason for Consultation: Establishing goals of care and Psychosocial/spiritual support  HPI/Patient Profile: 71 y.o. female  admitted on 03/21/2017 with a past medical history of COPD, Atrial Fibrillation and Hypertension presents with 1 week of increasing shortness of breath and cough with sputum production.  She saw her pulmonologist on 6/27 and was diagnosed with a COPD exacerbation and prescribed a prednisone taper and Keflex, in addition to her home meds including  Symbicort and Duoneb, she continued to worsen and presented at Emergency Department.   In the ED her oxygenation was 100% on her home oxygen level of 3L. CBC showed elevated WBC in the setting of recent prednisone taper.   Patient reports she was hospitalized in March with pneumonia and had not rebounded since.  Prior to March she was going to the gym and living well independently.   Patient faces the physical, emotional and financial struggles of living with chronic life limiting illness.     Clinical Assessment and Goals of Care:  This NP Lorinda Creed reviewed medical records, received report from team, assessed the patient and then meet at the patient's bedside   to discuss diagnosis, prognosis, GOC,  and options.  A discussion was had today regarding advanced directives.  Concepts specific to code status was had.    Values and goals of care important to patient and family were attempted to be elicited.  Concept of Hospice and Palliative Care were discussed  Natural trajectory and expectations of the diagnosis of COPD was  discussed.  Questions and concerns addressed.   PMT will continue to support holistically.  No documented HPOA     SUMMARY OF RECOMMENDATIONS    Code Status/Advance Care Planning:  Full code- encouraged Ms Amsler to consider all elements of her advance directives   Symptom Management:   Dyspnea: Roxanol 5 mg po every 4 hrs prn--discuss with patitn the utilization of opioids for dyspnea  Discussed incorporation of exercise/ pulmonary rehab, relaxation breathing, meditation/prayer into her treatment plan.  Palliative Prophylaxis:   Aspiration, Bowel Regimen and Oral Care  Additional Recommendations (Limitations, Scope, Preferences):  Full Scope Treatment  Psycho-social/Spiritual:   She has limited social support.  Her daughter is "in worse condition than me with COPD", her son works a vigorous schedule.  She has limited resources to augment her home needs.  I did [facility] but was disappointed in limited viable resources.    Desire for further Chaplaincy support:  no  Additional Recommendations: Education on Hospice  Prognosis:   Unable to determine  Discharge Planning: Home with Home Health  Recommend Palliative Home Service and [facility] visiting support.    Primary Diagnoses: Present on Admission: . COPD exacerbation (HCC)   I have reviewed the medical record, interviewed the patient and family, and examined the patient. The  following aspects are pertinent.  Past Medical History:  Diagnosis Date  . Chronic airway obstruction, not elsewhere classified   . Glaucoma   . H/O chronic bronchitis   . Hypertension   . Macular degeneration   . Persistent atrial fibrillation (HCC)    CHADS2VSC score 4 on Eliquis  . Pneumonia   . Shortness of breath 10/08/11   "all the time last 1 1/2 wk"  . 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 Marital status: Married    Spouse name: N/A  . Number of children: N/A  . Years of education: N/A   Occupational History  . Retired    Social History Main  Topics  . Smoking status: Former Smoker    Packs/day: 0.00    Types: Cigarettes    Quit date: 06/22/2015  . Smokeless tobacco: Former Neurosurgeon    Quit date: 02/26/2015     Comment: 4-5 cigs a day - QUIT Oct 2016  . Alcohol use Yes  . Drug use: No  . Sexual activity: No   Other Topics Concern  . None   Social History Narrative  . None   Family History  Problem Relation Age of Onset  . Liver cancer Mother   . Emphysema Father    Scheduled Meds: . apixaban  5 mg Oral BID  . azithromycin  250 mg Oral Daily  . ipratropium-albuterol  3 mL Nebulization QID  . mouth rinse  15 mL Mouth Rinse BID  . methylPREDNISolone (SOLU-MEDROL) injection  40 mg Intravenous Q12H  . metoprolol succinate  75 mg Oral Daily  . mometasone-formoterol  2 puff Inhalation BID  . PARoxetine  10 mg Oral Daily   Continuous Infusions: PRN Meds:.albuterol, ALPRAZolam, guaiFENesin-dextromethorphan, morphine Medications Prior to Admission:  Prior to Admission medications   Medication Sig Start Date End Date Taking? Authorizing Provider  albuterol (PROVENTIL HFA;VENTOLIN HFA) 108 (90 Base) MCG/ACT inhaler Inhale 2 puffs into the lungs every 6 (six) hours as needed for wheezing or shortness of breath. Please dispense generic. 12/02/15  Yes Gherghe, Daylene Katayama, MD  ALPRAZolam Prudy Feeler) 0.5 MG tablet TAKE 1 TABLET THREE TIMES DAILY AS NEEDED FOR ANXIETY 01/25/17  Yes Young, Rennis Chris, MD  apixaban (ELIQUIS) 5 MG TABS tablet Take 1 tablet (5 mg total) by mouth 2 (two) times daily. 01/20/17  Yes Turner, Cornelious Bryant, MD  budesonide-formoterol (SYMBICORT) 160-4.5 MCG/ACT inhaler Inhale 2 puffs into the lungs 2 (two) times daily. 01/25/17  Yes Young, Joni Fears D, MD  CALCIUM PO Take 1 tablet by mouth daily.   Yes [provider]  furosemide (LASIX) 20 MG tablet Take 1 tablet (20 mg total) by mouth every other day. 02/09/17  Yes Leone Brand, NP  guaiFENesin-dextromethorphan (ROBITUSSIN DM) 100-10 MG/5ML syrup Take 10 mLs by mouth  every 4 (four) hours as needed for cough. 12/03/16  Yes Dorothea Ogle, MD  ipratropium-albuterol (DUONEB) 0.5-2.5 (3) MG/3ML SOLN Take 3 mLs by nebulization 4 (four) times daily. Use one vial via nebulizer 2 times daily 01/28/17  Yes Young, Clinton D, MD  lisinopril (PRINIVIL,ZESTRIL) 20 MG tablet Take 20 mg by mouth daily.   Yes [provider]  metoprolol succinate (TOPROL-XL) 25 MG 24 hr tablet Take 3 tablets (75 mg total) by mouth daily. Take with or immediately following a meal. 02/01/17 05/02/17 Yes Leone Brand, NP  PARoxetine (PAXIL) 10 MG tablet Take 1 tablet (10 mg total)  by mouth daily. 12/04/16  Yes Dorothea OgleMyers, Iskra M, MD  predniSONE (DELTASONE) 10 MG tablet Take 1 tablet (10 mg total) by mouth daily with breakfast. 02/16/17  Yes Waymon BudgeYoung, Clinton D, MD   Allergies  Allergen Reactions  . Bystolic [Nebivolol Hcl]     Was with ++ itching, improved after stopping Bystolic   Review of Systems  Constitutional: Positive for fatigue.  Respiratory: Positive for shortness of breath.   Neurological: Positive for weakness.    Physical Exam  Constitutional: She is oriented to person, place, and time. She appears well-developed. She appears ill. Nasal cannula in place.  HENT:  Mouth/Throat: Oropharynx is clear and moist.  Cardiovascular: Normal rate, regular rhythm and normal heart sounds.   Pulmonary/Chest: She has decreased breath sounds in the right lower field and the left lower field.  Neurological: She is alert and oriented to person, place, and time.  Skin: Skin is warm and dry.    Vital Signs: BP (!) 157/65 (BP Location: Right Arm)   Pulse 74   Temp 97.7 F (36.5 C) (Oral)   Resp 18   Ht 5\' 1"  (1.549 m)   Wt 52.3 kg (115 lb 4.8 oz)   SpO2 98%   BMI 21.79 kg/m  Pain Assessment: 0-10   Pain Score: 3    SpO2: SpO2: 98 % O2 Device:SpO2: 98 % O2 Flow Rate: .O2 Flow Rate (L/min): 3 L/min  IO: Intake/output summary:  Intake/Output Summary (Last 24 hours) at 03/23/17  1013 Last data filed at 03/23/17 [id number]  Gross per 24 hour  Intake              720 ml  Output              675 ml  Net               45 ml    LBM: Last BM Date: 03/19/17 Baseline Weight: Weight: 53 kg (116 lb 13.5 oz) Most recent weight: Weight: 52.3 kg (115 lb 4.8 oz)     Palliative Assessment/Data: 70 % at best   Flowsheet Rows     Most Recent Value  Intake Tab  Referral Department  -- [internal medicine]  Unit at Time of Referral  Med/Surg Unit  Palliative Care Primary Diagnosis  Pulmonary  Date Notified  03/22/17  Palliative Care Type  New Palliative care  Reason for referral  Clarify Goals of Care, Non-pain Symptom  Date of Admission  03/21/17  # of days IP prior to Palliative referral  1  Clinical Assessment  Psychosocial & Spiritual Assessment  Palliative Care Outcomes     Discussed with Dr Rogelia BogaButcher and team  Time In: 0830 Time Out: 0945 Time Total: 75 min Greater than 50%  of this time was spent counseling and coordinating care related to the above assessment and plan.  Signed by: Lorinda CreedLARACH, Tehillah Cipriani, NP   Please contact Palliative Medicine Team phone at [phone] for questions and concerns.  For individual provider: See Loretha StaplerAmion

## 2017-03-23 NOTE — Progress Notes (Signed)
Subjective: [name] seen this morning along with palliative care. She remains in mild distress with continued concernsabout feeling as though she cannot get enough air despite adequate oxygen saturation. She states that she about the same as yesterday. She decided to try the prescribed PRN Morphine once she was informed that it was available to her. She expressed some concern to the team about the potential side effects of the morphine and was assured that although there are some side effects including drowsiness that the hospital setting would be a safe time for her to see if the medication works well for her.  The patient expressed anxiety over the prospect of returning home in her current condition. The team spoke to the patient at length about several options available to her concerning rehabilitation and optimizing her independence. Options discussed included [facility] (in which she had previously participated in [facility]), [facility], and [facility] [place] [place].   Objective:  Vital signs in last 24 hours: Vitals:   03/23/17 0406 03/23/17 0838 03/23/17 0911 03/23/17 1212  BP: (!) 152/73  (!) 157/65   Pulse: 73  74   Resp:   18   Temp: 98.7 F (37.1 C)  97.7 F (36.5 C)   TempSrc: Oral  Oral   SpO2: 97% 96% 98% 98%  Weight:      Height:       Physical Exam  Constitutional: She is oriented to person, place, and time. She appears well-developed and well-nourished.  HENT:  Head: Normocephalic.  Mouth/Throat: Oropharynx is clear and moist.  Eyes: EOM are normal. No scleral icterus.  Cardiovascular: Normal rate and regular rhythm.  Exam reveals no gallop and no friction rub.   No murmur heard. Pulmonary/Chest: She has no wheezes. She has no rales.  Mild distress, Distant Breath Sounds  Abdominal: Soft. Bowel sounds are normal. She exhibits no distension. There is no tenderness.  Musculoskeletal: She exhibits no edema or deformity.    Neurological: She is alert and oriented to person, place, and time.  Skin: Skin is warm and dry.   - WBC remained elevated on 9/2 secondary to steroids - Na 131 on 7/2 (130 on 7/1), Cl 86 on 7/2 (86 on 7/1) - CXR 7/2: COPD. Mild interstitial prominence likely reflects the patient's smoking history and there may be superimposed acute bronchitis. There is no alveolar pneumonia nor CHF. Thoracic aortic atherosclerosis.  Assessment/Plan:  Active Problems:   COPD exacerbation (HCC)  71 year old female with a history of Severe COPD, Atrial Fibrillation and Hypertension presents with 1 week of increasing shortness of breath and cough with sputum production.  Increased SOB and Sputum Production in the setting of Servere COPD and recent failed outpatient treatment for COPD exacerbation, likely 2/2 COPD exacerbation. Differential includes pneumonia (unlikely due to negative CXR). Elevated WBC on admission expected following her recent prednisone taper. - No change in status from yesterday - Palliative care consult:  -   - 5mg  Morphine Oral PRN q4H for SOB - Repeat CXR negative for PNA - PT/OT consult to evaluate   - Azithromycin - Duonebs q6H - Methylprednisolone 40mg  IV q12H  - Converted to Oral - Dulera (mometasone-formoterol) - Albuterol neb q4H PRN - Robitussin q4H PRN  A-fib - Continue home Metoprolol succinate 75mg  - Continue eliquis 5mg   Anxiety  - Continue home xanax and paroxitine  F/E/N: Regular Diet  DVT Prophylaxis: On Eliquis   Code Status: Full Code  Dispo:  Anticipated discharge in approximately 1 day.   Ginette Otto, DO IM PGY-1 Pager: [phone]

## 2017-03-23 NOTE — Evaluation (Signed)
Occupational Therapy Evaluation Patient Details Name: [name] MRN: [id number] DOB: 04-08-1946 Today's Date: 03/23/2017    History of Present Illness [name] is a 71 year old female who presented with chief complaint of worsening dyspnea.  has a past medical history of Chronic airway obstruction, not elsewhere classified; Glaucoma; H/O chronic bronchitis; Hypertension; Macular degeneration; Persistent atrial fibrillation (HCC); Pneumonia; Shortness of breath (10/08/11); and Ventilator dependent (HCC) (2004; 2008).   Clinical Impression   Pt was admitted for the above. At baseline, she is independent with adls and adjusts her activities as needed due to COPD.  Pt has increased dyspnea this time and requires a lot of rest breaks within activities. She will benefit from continued OT to increase activity tolerance for safe d/c.    Follow Up Recommendations  Supervision - Intermittent    Equipment Recommendations   (to be further assessed)    Recommendations for Other Services       Precautions / Restrictions Precautions Precaution Comments: Watch O2 sats Restrictions Weight Bearing Restrictions: No      Mobility Bed Mobility Overal bed mobility: Modified Independent             General bed mobility comments: HOB raised  Transfers   Equipment used: None   Sit to Stand: Supervision         General transfer comment: assist for 02 line    Balance                                           ADL either performed or assessed with clinical judgement   ADL Overall ADL's : Needs assistance/impaired Eating/Feeding: Independent   Grooming: Oral care;Set up;Bed level   Upper Body Bathing: Set up;Sitting   Lower Body Bathing: Minimal assistance;Sit to/from stand   Upper Body Dressing : Minimal assistance;Sitting (for lines)   Lower Body Dressing: Minimal assistance;Sit to/from stand   Toilet Transfer: Supervision/safety;Ambulation;BSC    Toileting- ArchitectClothing Manipulation and Hygiene: Supervision/safety;Sit to/from stand         General ADL Comments: [facility]  Completed bathing from there.  Returned to bed to rest.  She is able to reach to feet, but she needs extensive rest to complete.  Dropped off energy conservation sheets; pt was receiving a breathing tx. Will try to stop by and review later or on next visit.     Vision         Perception     Praxis      Pertinent Vitals/Pain Pain Assessment: No/denies pain     Hand Dominance     Extremity/Trunk Assessment Upper Extremity Assessment Upper Extremity Assessment: Overall WFL for tasks assessed           Communication Communication Communication: No difficulties (limited by dyspnea)   Cognition Arousal/Alertness: Awake/alert Behavior During Therapy: WFL for tasks assessed/performed;Anxious Overall Cognitive Status: Within Functional Limits for tasks assessed                                     General Comments       Exercises     Shoulder Instructions      Home Living Family/patient expects to be discharged to:: Private residence Living Arrangements: Alone Available Help at Discharge: Friend(s);Family  Bathroom Shower/Tub: Producer, television/film/video: Standard     Home Equipment: Environmental consultant - 2 wheels          Prior Functioning/Environment Level of Independence: Independent        Comments: "I take my time with things." Still driving        OT Problem List: Decreased strength;Decreased activity tolerance;Pain;Cardiopulmonary status limiting activity      OT Treatment/Interventions: Self-care/ADL training;Energy conservation;DME and/or AE instruction;Patient/family education    OT Goals(Current goals can be found in the care plan section) Acute Rehab OT Goals Patient Stated Goal: return to independence OT Goal Formulation: With patient Time For Goal Achievement:  03/30/17 Potential to Achieve Goals: Fair ADL Goals Pt Will Transfer to Toilet: with modified independence;ambulating;regular height toilet Additional ADL Goal #1: pt will gather adl supplies and perform adl at mod I level and initiate rest breaks as needed without cues Additional ADL Goal #2: pt will verbalize 3 energy conservation strategies  OT Frequency: Min 2X/week   Barriers to D/C:            Co-evaluation              AM-PAC PT "6 Clicks" Daily Activity     Outcome Measure Help from another person eating meals?: None Help from another person taking care of personal grooming?: A Little Help from another person toileting, which includes using toliet, bedpan, or urinal?: A Little Help from another person bathing (including washing, rinsing, drying)?: A Little Help from another person to put on and taking off regular upper body clothing?: A Little Help from another person to put on and taking off regular lower body clothing?: A Little 6 Click Score: 19   End of Session    Activity Tolerance:  (limited by breathing) Patient left: in bed;with call bell/phone within reach  OT Visit Diagnosis: Muscle weakness (generalized) (M62.81)                Time: 1610-9604 OT Time Calculation (min): 17 min Charges:  OT General Charges $OT Visit: 1 Procedure OT Evaluation $OT Eval Low Complexity: 1 Procedure G-Codes:     [name], OTR/L [phone] 03/23/2017  [name] 03/23/2017, 1:23 PM

## 2017-03-23 NOTE — Progress Notes (Signed)
  Date: 03/23/2017  Patient name: [name]  Medical record number: [id number]  Date of birth: Apr 10, 1946   I have seen and evaluated this patient and I have discussed the plan of Ibarra with the house staff. Please see their note for complete details. I concur with their findings with the following additions/corrections: [name] was seen on morning rounds. [name] had already spoken to her but also participated in all discussion with the patient. Today, her breathing is slightly better and she had taken a dose of morphine earlier today. She is still feeling weak and concerned about going home and succeeding living independently at home. She told [name] that she had not been taking Paxil because her insurance did not cover it. Her PCP had changed her Lexapro and she never took it because she was taking so many pills already. She completed a pulmonary rehabilitation in 2016 and does not remember being strongly encouraged to return back to pulmonary rehabilitation by her PCP in June of this year. [name] does not feel that she is hospice eligible as it is not certain her life expectancy is less than 6 months. We will attempt to arrange as many home services as possible including home health, [facility], and palliative Ibarra. [name] will also look into [facility].  On exam, she still using accessory muscles to breathe but not as significantly as yesterday. She also appears less dyspneic. She has better air movement especially on the left but it is still overall poor. Her problem is both oxygenation as she requires 3 L of O2 continuously and desaturated on ambulation to 89% yesterday and ventilation as she is a chronic CO2 retainer and has a hunger.  The goal is possible DC'd to home tomorrow with [facility], and a palliative [facility]barra consult. Possible [facility] referral. Morphine as needed. Change paroxetine to Lexapro. Complete a med rec to limit  the number of medications she has to take.  Burns SpainButcher, Elizabeth A, MD 03/23/2017, 3:37 PM

## 2017-03-23 NOTE — Progress Notes (Signed)
Patient seen in room to review prior to admission medications. Patient remains in mild distress but is looking and feeling a little better on trial of Morphine for her air hunger. Patient reports some drowsiness with medication, but states that she needed some rest.  While with the patient, her son [name] [phone]) arrived and [name] and I discussed her condition and disposition plans with him. He stated he would be able to be at the hospital for morning rounds tomorrow between 8 and 10am for further information. He also stated that while he travels a lot for work, he will be in town tomorrow and would be able to stay with his mother for the day and following night if she is discharged tomorrow (7/4).  Kaitlyn OttoAlec Melvin, DO IM PGY-1 Pager: [phone]

## 2017-03-23 NOTE — Progress Notes (Signed)
OT Cancellation Note  Patient Details Name: Kaitlyn MoralesFrances L Zirkelbach MRN: [id number] DOB: 20-Jan-1946   Cancelled Treatment:    Reason Eval/Treat Not Completed: Other (comment). Pt has dyspnea at rest; doesn't feel she can participate in ADLs right now. Will try to check back later.  Latrel Szymczak 03/23/2017, 7:49 AM  Marica OtterMaryellen Doha Boling, OTR/L [phone] 03/23/2017

## 2017-03-24 NOTE — Progress Notes (Signed)
Physical Therapy Treatment Patient Details Name: Kaitlyn MoralesFrances L Ibarra MRN: [id number] DOB: Apr 04, 1946 Today's Date: 03/24/2017    History of Present Illness [name] is a 71 year old female who presented with chief complaint of worsening dyspnea.  has a past medical history of Chronic airway obstruction, not elsewhere classified; Glaucoma; H/O chronic bronchitis; Hypertension; Macular degeneration; Persistent atrial fibrillation (HCC); Pneumonia; Shortness of breath (10/08/11); and Ventilator dependent (HCC) (2004; 2008).    PT Comments    Pt able to walk yesterday for 100', unable to go more than 20' today before needing to sit and taking >5 mins before being able to speak or change positions. At this point, pt would be unable to get her groceries or even prepare food if someone could bring it to her. She is unable to speak when in distress so question her ability to call for help. Discussed possibility of life alert. Pt does not feel that she can go home, though hesitant to discuss SNF. At this point, PT feels that she will not be safe and able to care for self at home, changing recommendation to SNF. PT will continue to follow.     Follow Up Recommendations  SNF;Supervision/Assistance - 24 hour     Equipment Recommendations  None recommended by PT    Recommendations for Other Services       Precautions / Restrictions Precautions Precautions: Other (comment) Precaution Comments: Watch O2 sats Restrictions Weight Bearing Restrictions: No    Mobility  Bed Mobility Overal bed mobility: Needs Assistance Bed Mobility: Supine to Sit;Sit to Supine     Supine to sit: Modified independent (Device/Increase time) Sit to supine: Min assist   General bed mobility comments: after returning to bed from bathroom, was unable to get legs into bed, min A to get back into bed  Transfers Overall transfer level: Needs assistance Equipment used: None Transfers: Sit to/from Stand Sit to Stand:  Min guard         General transfer comment: min-guard for safety  Ambulation/Gait Ambulation/Gait assistance: Min guard Ambulation Distance (Feet): 20 Feet Assistive device: None Gait Pattern/deviations: Step-through pattern;Decreased stride length Gait velocity: decreased Gait velocity interpretation: Below normal speed for age/gender General Gait Details: pt could not tolerate distance today that she was able to go yesterday. After 20' was in distress and took >5 mins sitting EOB before being able to change position again. 97% O2 sats on 3 L O2   Stairs            Wheelchair Mobility    Modified Rankin (Stroke Patients Only)       Balance Overall balance assessment: Needs assistance Sitting-balance support: Single extremity supported Sitting balance-Leahy Scale: Fair Sitting balance - Comments: could not tolerate unsupported sitting EOB due to being unable to breathe. Was mildly unsteady EOB after ambulation   Standing balance support: No upper extremity supported Standing balance-Leahy Scale: Fair Standing balance comment: fair when in distress                            Cognition Arousal/Alertness: Awake/alert Behavior During Therapy: WFL for tasks assessed/performed;Anxious Overall Cognitive Status: Within Functional Limits for tasks assessed                                        Exercises      General Comments General comments (skin integrity, edema,  etc.): pt saying, "help me" after ambulating. Could not get any other words out for at least 5 mins.       Pertinent Vitals/Pain Pain Assessment: No/denies pain    Home Living                      Prior Function            PT Goals (current goals can now be found in the care plan section) Acute Rehab PT Goals Patient Stated Goal: return to independence PT Goal Formulation: With patient Time For Goal Achievement: 04/05/17 Potential to Achieve Goals:  Good Progress towards PT goals: Not progressing toward goals - comment (more breathing difficulties)    Frequency    Min 3X/week      PT Plan Discharge plan needs to be updated    Ibarra-evaluation              AM-PAC PT "6 Clicks" Daily Activity  Outcome Measure  Difficulty turning over in bed (including adjusting bedclothes, sheets and blankets)?: None Difficulty moving from lying on back to sitting on the side of the bed? : A Little Difficulty sitting down on and standing up from a chair with arms (e.g., wheelchair, bedside commode, etc,.)?: None Help needed moving to and from a bed to chair (including a wheelchair)?: A Little Help needed walking in hospital room?: A Little Help needed climbing 3-5 steps with a railing? : Total 6 Click Score: 18    End of Session Equipment Utilized During Treatment: Oxygen Activity Tolerance: Patient limited by fatigue;Other (comment) (limited by breathing issues) Patient left: in bed;with call bell/phone within reach Nurse Communication: Mobility status PT Visit Diagnosis: Muscle weakness (generalized) (M62.81);Other (comment);Difficulty in walking, not elsewhere classified (R26.2) (decreased functional capacity)     Time: 4098-1191 PT Time Calculation (min) (ACUTE ONLY): 28 min  Charges:  $Gait Training: 8-22 mins $Therapeutic Activity: 8-22 mins                    G Codes:       [name], PT  Acute Rehab Services  [phone]    [name] 03/24/2017, 11:37 AM

## 2017-03-24 NOTE — Progress Notes (Signed)
Patient ID: Lissa MoralesFrances L Tippins, female   DOB: [birth year], 71 y.o.   MRN: [id number]  This NP visited patient at the bedside as a follow up to  yesterday's GOCs meeting.  patient's son was present  Continued conversation regarding  diagnosis, prognosis, GOC,  disposition and options.  Discussed the natural trajectory and expectations  of COPD   I shared my concern that patient is high risk for decompensation   A  discussion was had today regarding advanced directives.  The difference between a aggressive medical intervention path  and a palliative comfort care path for this patient at this time was had.  Hospice benefit was discussed  Values and goals of care important to patient and family were attempted to be elicited.  Discussed with patient the importance of continued conversation with family and their  medical providers regarding overall plan of care and treatment options,  ensuring decisions are within the context of the patients values and GOCs.  Patient is anticipating transition home today, son tells me family will continue to support Ms [name] and work on home plan.  Questions and concerns addressed     PMT will continue to support holistically.  Time in   0800        Time out    0845   Total time spent   Greater than 50% of the time was spent in counseling and coordination of care  Lorinda CreedMary Domique Clapper NP  Palliative Medicine Team Team Phone # [phone] Pager [phone]

## 2017-03-24 NOTE — Progress Notes (Signed)
   Subjective: [name] seen in her room this morning with her son present. He was also able to be present when palliative care came and saw her today. She was looking well today and has been taking her PRN morphine, which has provided her with some relief from air hunger. She was advised that she would continue to receive morphine at home, but it would be in a pill form. She was also encouraged to start taking her Lexipro, which she had been previously prescribed but was not taking.   Her son will be at home with her today and tonight if she is discharged today. Resources for rehab and follow-up care were again discussed (and discussed with palliative care as well.) Patient was asked if she would like to switch to [facility] for her primary care and she said she would.   Objective:  Vital signs in last 24 hours: Vitals:   03/24/17 0446 03/24/17 0735 03/24/17 0956 03/24/17 1126  BP: (!) 147/62  (!) 150/54   Pulse: 73  82   Resp: 20  18   Temp: 98.2 F (36.8 C)  98.7 F (37.1 C)   TempSrc: Oral  Oral   SpO2: 98% 92% 96% 97%  Weight:      Height:       Physical Exam  Constitutional: She is oriented to person, place, and time. She appears well-developed and well-nourished.  HENT:  Head: Normocephalic.  Mouth/Throat: Oropharynx is clear and moist.  Eyes: EOM are normal. No scleral icterus.  Cardiovascular: Normal rate and regular rhythm.  Exam reveals no gallop and no friction rub.   No murmur heard. Pulmonary/Chest: She has no wheezes. She has no rales.  Mild distress, Distant Breath Sounds (improved from yesterday)  Abdominal: Soft. Bowel sounds are normal. She exhibits no distension. There is no tenderness.  Musculoskeletal: She exhibits no edema or deformity.  Neurological: She is alert and oriented to person, place, and time.  Skin: Skin is warm and dry.   Assessment/Plan:  Active Problems:   COPD exacerbation (HCC)   Shortness of breath   Palliative  care by specialist   DNR (do not resuscitate) discussion  71 year old female with a history of Severe COPD, Atrial Fibrillation and Hypertension presented with 1 week of increasing shortness of breath and cough with sputum production.  Increased SOB and Sputum Production in the setting of Servere COPD and recent failed outpatient treatment for COPD exacerbation, likely 2/2 COPD exacerbation. - Acute exacerbation resolved - Palliative care consult: Completed  - Provided patient with information on resources - 5mg  Morphine Oral PRN q4H for SOB - PT/OT consult: Pending - Azithromycin - Duonebs q6H - Methylprednisolone   - Converted to Oral 40mg  - Dulera (mometasone-formoterol) - Albuterol neb q4H PRN - Robitussin q4H PRN  A-fib - Continue home Metoprolol succinate 75mg  - Continue eliquis 5mg   Anxiety  - Continue home xanax  - Paroxitine discontinued  - Home Lexipro started  F/E/N: Regular Diet  DVT Prophylaxis: On Eliquis   Code Status: Full Code  Dispo: Anticipated discharge Today pending PT evaluation.   Ginette OttoAlec Melvin, DO IM PGY-1 Pager: [phone]

## 2017-03-24 NOTE — Progress Notes (Signed)
  Date: 03/24/2017  Patient name: [name]  Medical record number: [id number]  Date of birth: 12-Nov-1945   I have seen and evaluated this patient and I have discussed the plan of care with the house staff. Please see their note for complete details. I concur with their findings with the following additions/corrections: [name] was seen on morning rounds. She continues to complain of dyspnea which is slightly helped by oral morphine. She also complains of fatigue and dreads the thought of returning home due to the fatigue and dyspnea. She is moving much more air on examination today as I could hear expiratory wheezes throughout entire expiration. PT reevaluated the patient today and she was only able to ambulate 20 feet before having to sit. At this point she and her son are agreeable to SNF which we will work on. She is agreeable to see us in the outpatient clinic and we will schedule her an appointment when she is ready to leave the nursing facility. She can go to a nursing facility with outpatient palliative care.   Burns SpainButcher, Elizabeth A, MD 03/24/2017, 4:35 PM

## 2017-03-24 NOTE — Progress Notes (Signed)
Mrs. [name] was seen in her room with he son present to address PT new recommendation upon their reevaluation that she be discharged to a SNF. Both were agreeable to this arrangements. Her son expressed concern over Mrs. [name]'s increased drowsiness.  She did seem a more tired this evening compared to when medications were reviewed with her yesterday, howeverhe was easily roused and answered questions appropriately. She also stated that she had been getting tired most afternoons while in the hospital.

## 2017-03-25 LAB — CBC
HCT: 37.8 % (ref 36.0–46.0)
Hemoglobin: 11.9 g/dL — ABNORMAL LOW (ref 12.0–15.0)
MCH: 27.9 pg (ref 26.0–34.0)
MCHC: 31.5 g/dL (ref 30.0–36.0)
MCV: 88.7 fL (ref 78.0–100.0)
PLATELETS: 484 10*3/uL — AB (ref 150–400)
RBC: 4.26 MIL/uL (ref 3.87–5.11)
RDW: 12.3 % (ref 11.5–15.5)
WBC: 30.7 10*3/uL — AB (ref 4.0–10.5)

## 2017-03-25 LAB — BASIC METABOLIC PANEL
ANION GAP: 7 (ref 5–15)
BUN: 18 mg/dL (ref 6–20)
CO2: 39 mmol/L — ABNORMAL HIGH (ref 22–32)
Calcium: 9.3 mg/dL (ref 8.9–10.3)
Chloride: 79 mmol/L — ABNORMAL LOW (ref 101–111)
Creatinine, Ser: 0.45 mg/dL (ref 0.44–1.00)
Glucose, Bld: 189 mg/dL — ABNORMAL HIGH (ref 65–99)
Potassium: 4.4 mmol/L (ref 3.5–5.1)
SODIUM: 125 mmol/L — AB (ref 135–145)

## 2017-03-25 MED ORDER — PREDNISONE 20 MG PO TABS
40.0000 mg | ORAL_TABLET | Freq: Every day | ORAL | Status: DC
  Administered 2017-03-26: 40 mg via ORAL
  Filled 2017-03-25: qty 2

## 2017-03-25 MED ORDER — SODIUM CHLORIDE 0.9 % IV BOLUS (SEPSIS)
1000.0000 mL | Freq: Once | INTRAVENOUS | Status: AC
  Administered 2017-03-25: 1000 mL via INTRAVENOUS

## 2017-03-25 MED ORDER — ALPRAZOLAM 0.25 MG PO TABS
0.2500 mg | ORAL_TABLET | Freq: Three times a day (TID) | ORAL | Status: DC | PRN
  Administered 2017-03-26: 0.25 mg via ORAL
  Filled 2017-03-25: qty 1

## 2017-03-25 NOTE — Progress Notes (Signed)
   Subjective:  PT evaluation yesterday found Kaitlyn. [name] had become deconditioned and recommended discharge to a SNF.  [name] seen in her room this morning with her son present. She continues to feel very short of breath but has felt some relief taking morphine. She took 1 morphine last night and one early this morning. The morphine is however making her a little drowsy.   She has agreed to go to a SNF following discharge once she is able to review her options with social work. We also talked to her about decreasing her xanax; she did not use any yesterday and does not endorse any increased anxiety with it. She was agreeable to tapering the xanax and continuing to take the lexipro when she goes to the SNF.  Objective:  Vital signs in last 24 hours: Vitals:   03/24/17 1528 03/24/17 1939 03/24/17 2019 03/25/17 0513  BP:   128/62 (!) 157/61  Pulse:   75 79  Resp:   (!) 22 (!) 21  Temp:   98.9 F (37.2 C) 97.8 F (36.6 C)  TempSrc:   Oral Oral  SpO2: 97% 97% 95% 100%  Weight:      Height:       Physical Exam  Constitutional: She is oriented to person, place, and time. She appears well-developed and well-nourished.  HENT:  Head: Normocephalic.  Mouth/Throat: Oropharynx is clear and moist.  Eyes: EOM are normal. No scleral icterus.  Cardiovascular: Normal rate and regular rhythm.  Exam reveals no gallop and no friction rub.   No murmur heard. Pulmonary/Chest: She has no wheezes. She has no rales.  Mild distress, Distant Breath Sounds, some wheezing, but improved air flow.  Abdominal: Soft. Bowel sounds are normal. She exhibits no distension. There is no tenderness.  Musculoskeletal: She exhibits no edema or deformity.  Neurological: She is alert and oriented to person, place, and time.  Skin: Skin is warm and dry.   Assessment/Plan:  Active Problems:   COPD exacerbation (HCC)   Shortness of breath   Palliative care by specialist   DNR (do not resuscitate) discussion  71  year old female with a history of Severe COPD, Atrial Fibrillation and Hypertension presented with 1 week of increasing shortness of breath and cough with sputum production.  Increased SOB and Sputum Production in the setting of Servere COPD and recent failed outpatient treatment for COPD exacerbation, likely 2/2 COPD exacerbation. - Acute exacerbation resolved - Palliative care consult: Completed  - Provided patient with information on resources - 5mg  Morphine Oral PRN q4H for SOB - PT/OT consult: Recommend discharge to SNF - Azithromycin - Duonebs q6H - Methylprednisolone   - Convert to Oral 40mg  - Dulera (mometasone-formoterol) - Albuterol neb q4H PRN - Robitussin q4H PRN  A-fib - Continue home Metoprolol succinate 75mg  - Continue eliquis 5mg   Anxiety  - Reduce home xanax  - Paroxitine discontinued  - Home Lexipro started  F/E/N: Regular Diet  DVT Prophylaxis: On Eliquis   Code Status: Full Code  Dispo: Anticipated discharge to SNF Tomorrow.   Ginette OttoAlec Marielle Mantione, DO IM PGY-1 Pager: [phone]

## 2017-03-25 NOTE — Progress Notes (Signed)
  Date: 03/25/2017  Patient name: [name]  Medical record number: [id number]  Date of birth: [birth year]   I have seen and evaluated this patient and I have discussed the plan of care with the house staff. Please see their note for complete details. I concur with their findings with the following additions/corrections: [name] was seen on morning rounds. Her son was at the bedside. Yesterday, she felt somnolent despite not receiving any doses of morphine (received 1 dose at 2158) or Xanax. Today she got morphine at 5 AM and still feels tired and drained. She was able to work with physical therapy and standing at the sink to brush her teeth and ambulated 20 feet. She and her son are similar agreement for her to go to SNF at discharge. We discussed morphine usage and how the frequency would be based on her symptoms and also her goals. We also discussed that her goal weight change over time and therefore the morphine usage might also change. We reassured her that both palliative care and internal medicine would be able to help her adjust her dose of morphine as needed. She said she had been using Xanax 2-3 times at home. In the hospital, she has only been getting 0.5 mg once a day. She did not get any doses at all yesterday. We discussed titrating down the Xanax since we have resumed the Lexapro and she does not seem to be getting much benefit from the Xanax. Additionally this will decrease the risk of combining a benzodiazepine and opioid. Neither she nor her son had any concerns about titrating down the Xanax. Once a SNF bed is found, she will be able to be transferred. [facility]  Burns SpainButcher, Mathews Stuhr A, MD 03/25/2017, 1:31 PM

## 2017-03-25 NOTE — NC FL2 (Signed)
[place] MEDICAID FL2 LEVEL OF CARE SCREENING TOOL     IDENTIFICATION  Patient Name: [name] Birthdate: [birth year] Sex: female Admission Date (Current Location): 03/21/2017  [facility] and [place] Number:  Producer, television/film/video and Address:  The . [facility], [address] [place], [place] [address]      Provider Number: [id number]  Attending Physician Name and Address:  Burns Spain, MD  Relative Name and Phone Number:  Avayah Raffety - son - [phone] and Trinda Pascal - daughter - [phone]    Current Level of Care: Hospital Recommended Level of Care: Skilled Nursing Facility Prior Approval Number:    Date Approved/Denied:   PASRR Number: [id number] (Eff. 03/25/17)  Discharge Plan: SNF    Current Diagnoses: Patient Active Problem List   Diagnosis Date Noted  . Shortness of breath   . Palliative care by specialist   . DNR (do not resuscitate) discussion   . COPD exacerbation (HCC) 03/21/2017  . Pneumonia 11/27/2016  . Hyperglycemia 11/27/2016  . Anemia 11/27/2016  . Persistent atrial fibrillation (HCC)   . Chronic respiratory failure with hypoxia and hypercapnia (HCC) 09/11/2016  . Acute on chronic respiratory failure with hypoxia (HCC) 11/25/2015  . Anxiety 11/25/2015  . Dependent edema 11/25/2015  . Tachycardia 11/25/2015  . Hyponatremia   . COPD with respiratory distress, acute (HCC)   . COPD with acute exacerbation (HCC)   . CAP (community acquired pneumonia) 03/13/2015  . Respiratory distress 03/13/2015  . Pain of left calf 03/16/2014  . Neck pain 02/12/2014  . Allergic conjunctivitis and rhinitis 10/04/2012  . Essential hypertension 03/21/2009  . Chronic respiratory failure with hypoxia (HCC) 10/14/2007  . CT, CHEST, ABNORMAL 10/14/2007  . Tobacco use disorder, severe, in sustained remission 09/12/2007  . COPD GOLD III / 02 dep  09/12/2007    Orientation RESPIRATION BLADDER Height & Weight     Self, Time,  Place, Situation  O2 (3 Liters Oxygen) Continent Weight: 115 lb 14.4 oz (52.6 kg) Height:  5\' 1"  (154.9 cm)  BEHAVIORAL SYMPTOMS/MOOD NEUROLOGICAL BOWEL NUTRITION STATUS      Continent Diet (Regular)  AMBULATORY STATUS COMMUNICATION OF NEEDS Skin   Limited Assist (Patient ambulated 20 feet min guard) Verbally Normal                       Personal Care Assistance Level of Assistance  Bathing, Feeding, Dressing Bathing Assistance: Limited assistance Feeding assistance: Independent Dressing Assistance: Limited assistance     Functional Limitations Info  Sight, Hearing, Speech Sight Info: Impaired Hearing Info: Adequate Speech Info: Adequate    SPECIAL CARE FACTORS FREQUENCY  PT (By licensed PT), OT (By licensed OT)     PT Frequency: Evaluated 7/2 and a minimum of 3X per week therapy recommended OT Frequency: Evaluated 7/3 and a miniimum of 2X per week therapy recommended            Contractures Contractures Info: Not present    Additional Factors Info  Code Status, Allergies, Psychotropic, Insulin Sliding Scale Code Status Info: Full Allergies Info: Bystolic           Current Medications (03/25/2017):  This is the current hospital active medication list Current Facility-Administered Medications  Medication Dose Route Frequency Provider Last Rate Last Dose  . albuterol (PROVENTIL) (2.5 MG/3ML) 0.083% nebulizer solution 2.5 mg  2.5 mg Nebulization Q4H PRN Fuller Plan, MD   2.5 mg at 03/25/17 0504  . ALPRAZolam Prudy Feeler) tablet  0.25 mg  0.25 mg Oral TID PRN Fuller Planice, Christopher W, MD      . apixaban Everlene Balls(ELIQUIS) tablet 5 mg  5 mg Oral BID Fuller Planice, Christopher W, MD   5 mg at 03/25/17 1000  . escitalopram (LEXAPRO) tablet 10 mg  10 mg Oral Daily Fuller Planice, Christopher W, MD   10 mg at 03/25/17 0959  . guaiFENesin-dextromethorphan (ROBITUSSIN DM) 100-10 MG/5ML syrup 10 mL  10 mL Oral Q4H PRN Fuller Planice, Christopher W, MD   10 mL at 03/21/17 2031  . ipratropium-albuterol (DUONEB)  0.5-2.5 (3) MG/3ML nebulizer solution 3 mL  3 mL Nebulization QID Fuller Planice, Christopher W, MD   3 mL at 03/25/17 0757  . MEDLINE mouth rinse  15 mL Mouth Rinse BID Burns SpainButcher, Elizabeth A, MD   15 mL at 03/25/17 0959  . metoprolol succinate (TOPROL-XL) 24 hr tablet 75 mg  75 mg Oral Daily Fuller Planice, Christopher W, MD   75 mg at 03/25/17 1000  . mometasone-formoterol (DULERA) 200-5 MCG/ACT inhaler 2 puff  2 puff Inhalation BID Fuller Planice, Christopher W, MD   2 puff at 03/25/17 0757  . morphine 10 MG/5ML solution 5 mg  5 mg Oral Q4H PRN Fuller Planice, Christopher W, MD   5 mg at 03/25/17 0532  . [START ON 03/26/2017] predniSONE (DELTASONE) tablet 40 mg  40 mg Oral Q breakfast Rice, Jamesetta Orleanshristopher W, MD         Discharge Medications: Please see discharge summary for a list of discharge medications.  Relevant Imaging Results:  Relevant Lab Results:   Additional Information ss#[id number]  Cristobal GoldmannCrawford, Dyane Broberg Bradley, LCSW

## 2017-03-25 NOTE — Progress Notes (Addendum)
Physical Therapy Treatment Patient Details Name: [name] MRN: [id number] DOB: 04/15/1946 Today's Date: 03/25/2017    History of Present Illness [name] is a 71 year old female who presented with chief complaint of worsening dyspnea.  has a past medical history of Chronic airway obstruction, not elsewhere classified; Glaucoma; H/O chronic bronchitis; Hypertension; Macular degeneration; Persistent atrial fibrillation (HCC); Pneumonia; Shortness of breath (10/08/11); and Ventilator dependent (HCC) (2004; 2008).    PT Comments    Pt continues to be significantly limited with mobility this session. Pt has increased dyspnea upon exertion which she never fully recovers from during session. Performed teeth brushing seated at EOB x 15 min to complete due to increased rest breaks required for fatigue. O2 sats remain between 95-98% throughout session with 3L O2 in place. Pt continues to require SNF at discharge in order to improve strength and endurance prior to returning home.     Follow Up Recommendations  SNF;Supervision/Assistance - 24 hour     Equipment Recommendations  None recommended by PT    Recommendations for Other Services       Precautions / Restrictions Precautions Precautions: Other (comment) Precaution Comments: Watch O2 sats Restrictions Weight Bearing Restrictions: No    Mobility  Bed Mobility Overal bed mobility: Needs Assistance Bed Mobility: Supine to Sit;Sit to Supine     Supine to sit: Supervision Sit to supine: Supervision   General bed mobility comments: Pt requires supervision for safety with increased time required  Transfers Overall transfer level: Needs assistance Equipment used: None Transfers: Sit to/from Stand Sit to Stand: Min guard         General transfer comment: min-guard for safety  Ambulation/Gait Ambulation/Gait assistance: Min guard Ambulation Distance (Feet): 20 Feet Assistive device: None Gait Pattern/deviations:  Step-through pattern;Decreased stride length Gait velocity: decreased Gait velocity interpretation: Below normal speed for age/gender General Gait Details: Pt continues to be limited with distance today with 4/5 DOE with all gait and functional activities.    Stairs            Wheelchair Mobility    Modified Rankin (Stroke Patients Only)       Balance Overall balance assessment: Needs assistance Sitting-balance support: Single extremity supported Sitting balance-Leahy Scale: Fair Sitting balance - Comments: could not tolerate unsupported sitting EOB due to being unable to breathe. Was mildly unsteady EOB after ambulation   Standing balance support: No upper extremity supported Standing balance-Leahy Scale: Fair Standing balance comment: fair when in distress                            Cognition Arousal/Alertness: Awake/alert Behavior During Therapy: WFL for tasks assessed/performed;Anxious Overall Cognitive Status: Within Functional Limits for tasks assessed                                        Exercises      General Comments General comments (skin integrity, edema, etc.): pt sat EOB to perform tooth brushing and took 15 minutes to complete with rest breaks and increased fatigue noted.       Pertinent Vitals/Pain Pain Assessment: No/denies pain    Home Living                      Prior Function            PT Goals (current goals can  now be found in the care plan section) Acute Rehab PT Goals Patient Stated Goal: return to independence Progress towards PT goals: Progressing toward goals    Frequency    Min 3X/week      PT Plan Current plan remains appropriate    Co-evaluation              AM-PAC PT "6 Clicks" Daily Activity  Outcome Measure  Difficulty turning over in bed (including adjusting bedclothes, sheets and blankets)?: None Difficulty moving from lying on back to sitting on the side of the  bed? : A Little Difficulty sitting down on and standing up from a chair with arms (e.g., wheelchair, bedside commode, etc,.)?: A Little Help needed moving to and from a bed to chair (including a wheelchair)?: A Little Help needed walking in hospital room?: A Little Help needed climbing 3-5 steps with a railing? : Total 6 Click Score: 17    End of Session Equipment Utilized During Treatment: Gait belt;Oxygen Activity Tolerance: Patient limited by fatigue Patient left: in bed;with call bell/phone within reach;with family/visitor present Nurse Communication: Mobility status PT Visit Diagnosis: Muscle weakness (generalized) (M62.81);Other (comment) (decreased functional capacity)     Time: [id number] PT Time Calculation (min) (ACUTE ONLY): 32 min  Charges:  $Gait Training: 8-22 mins $Therapeutic Activity: 8-22 mins                    G Codes:       Colin BroachSabra M. Gabriell Daigneault PT, DPT  [phone]    Ruel FavorsSabra Aletha HalimMarie Lakia Gritton 03/25/2017, 9:30 AM

## 2017-03-25 NOTE — Clinical Social Work Note (Signed)
Clinical Social Work Assessment  Patient Details  Name: [name] MRN: [id number] Date of Birth: 1946-09-21  Date of referral:  03/24/17               Reason for consult:  Facility Placement                Permission sought to share information with:  Family Supports Permission granted to share information::  No (Verbal permission not given, however son was at bedside and patient allowed him to lead conversation with CSW)  Name::     [name]  Agency::     Relationship::  Son  Contact Information:  [phone]  Housing/Transportation Living arrangements for the past 2 months:  Single Family Home Source of Information:  Adult Children, Patient Patient Interpreter Needed:  None Criminal Activity/Legal Involvement Pertinent to Current Situation/Hospitalization:  No - Comment as needed Significant Relationships:  Adult Children Lives with:  Self Do you feel safe going back to the place where you live?  No (Patient and son in agreement with ST rehab prior to returning home) Need for family participation in patient care:  Yes (Comment)  Care giving concerns:  Son concerned about patient discharging home as she is very weak and short of breath.  Social Worker assessment / plan:  CSW talked with patient's son Dwayne and patient at the bedside regarding discharge disposition and recommendation of ST rehab. Patient was awake and alert, sitting up in bed, [name] is hard of hearing and CSW got near bed and spoke up so that patient could hear what was being said. Mr. [name] took the lead in discussing facility placement but would also assure his mother knew what he was saying. CSW visited room several times to discuss discharge disposition and visited once after patient got her lunch, which she did not eat. When asked about not eating her lunch, patient replied that she couldn't eat as she can't breathe.   Initial preference was [facility], however they do not have private room, so  another facility was chosen - [facility], and they can accept patient.    Employment status:  Retired Office managernsurance information:  Managed Medicare ([employer] HMO) PT Recommendations:  Skilled Nursing Facility Information / Referral to community resources:  Skilled Nursing Facility (Son provided with SNF list)  Patient/Family's Response to care: No concerns expressed by patient/son regarding care during hospitalization.  Patient/Family's Understanding of and Emotional Response to Diagnosis, Current Treatment, and Prognosis:  Not discussed.  Emotional Assessment Appearance:  Appears stated age Attitude/Demeanor/Rapport:  Other (Appropriate) Affect (typically observed):  Pleasant, Appropriate Orientation:  Oriented to Self, Oriented to Place, Oriented to  Time, Oriented to Situation Alcohol / Substance use:  Tobacco Use, Alcohol Use, Illicit Drugs (Patient reported that she quit smoking in 2016, drinks 2/3 beer per week and does not use illicit drugs) Psych involvement (Current and /or in the community):  No (Comment)  Discharge Needs  Concerns to be addressed:  Discharge Planning Concerns Readmission within the last 30 days:  No Current discharge risk:  None Barriers to Discharge:  Insurance Authorization   Cristobal GoldmannCrawford, Alexiya Franqui Bradley, LCSW 03/25/2017, 3:50 PM

## 2017-03-25 NOTE — Clinical Social Work Placement (Signed)
   CLINICAL SOCIAL WORK PLACEMENT  NOTE  Date:  03/25/2017  Patient Details  Name: [name] MRN: [id number] Date of Birth: 06-Feb-1946  Clinical Social Work is seeking post-discharge placement for this patient at the Skilled  Nursing Facility level of care (*CSW will initial, date and re-position this form in  chart as items are completed):  Yes   Patient/family provided with [facility] Clinical Social Work Department's list of facilities offering this level of care within the geographic area requested by the patient (or if unable, by the patient's family).  Yes   Patient/family informed of their freedom to choose among providers that offer the needed level of care, that participate in Medicare, Medicaid or managed care program needed by the patient, have an available bed and are willing to accept the patient.  Yes   Patient/family informed of [facility]'s ownership interest in [facility] and [facility], as well as of the fact that they are under no obligation to receive care at these facilities.  PASRR submitted to EDS on 03/25/17     PASRR number received on 03/25/17     Existing PASRR number confirmed on       FL2 transmitted to all facilities in geographic area requested by pt/family on 03/25/17     FL2 transmitted to all facilities within larger geographic area on       Patient informed that his/her managed care company has contracts with or will negotiate with certain facilities, including the following:        Yes   Patient/family informed of bed offers received.  Patient chooses bed at [facility]     Physician recommends and patient chooses bed at      Patient to be transferred to [facility] on  .  Patient to be transferred to facility by       Patient family notified on   of transfer.  Name of family member notified:        PHYSICIAN      Additional Comment:  03/25/17 - Submitted for insurance authorization with [facility] -  Pending case number [id number]. CSW informed that a person from [employer] will call regarding authorization.  _______________________________________________ [name], Kaitlyn Patchell Bradley, LCSW 03/25/2017, 3:58 PM

## 2017-03-26 ENCOUNTER — Inpatient Hospital Stay (HOSPITAL_COMMUNITY): Payer: Medicare HMO

## 2017-03-26 DIAGNOSIS — Z7189 Other specified counseling: Secondary | ICD-10-CM

## 2017-03-26 LAB — BASIC METABOLIC PANEL
ANION GAP: 10 (ref 5–15)
BUN: 17 mg/dL (ref 6–20)
CHLORIDE: 81 mmol/L — AB (ref 101–111)
CO2: 34 mmol/L — ABNORMAL HIGH (ref 22–32)
Calcium: 8.8 mg/dL — ABNORMAL LOW (ref 8.9–10.3)
Creatinine, Ser: 0.37 mg/dL — ABNORMAL LOW (ref 0.44–1.00)
GFR calc Af Amer: >60 mL/min (ref >60)
GFR calc non Af Amer: >60 mL/min (ref >60)
Glucose, Bld: 126 mg/dL — ABNORMAL HIGH (ref 65–99)
Potassium: 4.3 mmol/L (ref 3.5–5.1)
Sodium: 125 mmol/L — ABNORMAL LOW (ref 135–145)

## 2017-03-26 MED ORDER — MORPHINE SULFATE (PF) 2 MG/ML IV SOLN
0.5000 mg | Freq: Once | INTRAVENOUS | Status: AC
  Administered 2017-03-26: 0.5 mg via INTRAVENOUS
  Filled 2017-03-26: qty 1

## 2017-03-26 MED ORDER — IPRATROPIUM-ALBUTEROL 0.5-2.5 (3) MG/3ML IN SOLN
3.0000 mL | RESPIRATORY_TRACT | Status: DC
  Administered 2017-03-26 (×3): 3 mL via RESPIRATORY_TRACT
  Filled 2017-03-26 (×3): qty 3

## 2017-03-26 MED ORDER — SODIUM CHLORIDE 0.9 % IV SOLN
1.0000 mg/h | INTRAVENOUS | Status: DC
  Administered 2017-03-26: 1 mg/h via INTRAVENOUS
  Filled 2017-03-26: qty 10

## 2017-03-26 MED ORDER — MORPHINE BOLUS VIA INFUSION: 1.0000 mg | INTRAVENOUS | Status: DC | PRN

## 2017-03-26 MED ORDER — ENSURE ENLIVE PO LIQD
237.0000 mL | Freq: Three times a day (TID) | ORAL | Status: DC
  Administered 2017-03-26 – 2017-03-27 (×2): 237 mL via ORAL

## 2017-03-26 MED ORDER — ALBUTEROL SULFATE (2.5 MG/3ML) 0.083% IN NEBU: 2.5000 mg | INHALATION_SOLUTION | RESPIRATORY_TRACT | Status: DC | PRN

## 2017-03-26 MED ORDER — METHYLPREDNISOLONE SODIUM SUCC 40 MG IJ SOLR
40.0000 mg | Freq: Two times a day (BID) | INTRAMUSCULAR | Status: DC
  Administered 2017-03-26 – 2017-03-27 (×2): 40 mg via INTRAVENOUS
  Filled 2017-03-26 (×2): qty 1

## 2017-03-26 MED ORDER — IPRATROPIUM-ALBUTEROL 0.5-2.5 (3) MG/3ML IN SOLN
3.0000 mL | Freq: Four times a day (QID) | RESPIRATORY_TRACT | Status: DC
  Administered 2017-03-27: 3 mL via RESPIRATORY_TRACT
  Filled 2017-03-26: qty 3

## 2017-03-26 MED ORDER — MORPHINE BOLUS VIA INFUSION
0.5000 mg | INTRAVENOUS | Status: DC | PRN
Start: 2017-03-26 — End: 2017-03-27
  Administered 2017-03-26: 0.5 mg via INTRAVENOUS
  Filled 2017-03-26: qty 1

## 2017-03-26 NOTE — Progress Notes (Signed)
Patient's oxygen saturation dropped to 70s. Pt was asleep and on 1L [place]. Family present. [facility] called. MD aware. O2 increased to 4L [place] and oxygen increased to 95% after about 2 minutes. RR 12. Palliative team contacted. Requested palliative and med service to talk about changing code status with family.

## 2017-03-26 NOTE — Progress Notes (Signed)
  Date: 03/26/2017  Patient name: [name]  Medical record number: [id number]  Date of birth: [birth year]   I have seen and evaluated this patient and I have discussed the plan of care with the house staff. Please see their note for complete details. I concur with their findings with the following additions/corrections: [name] was seen on morning rounds. Her son and daughter-in-law were at bedside. She had received 5 mg of morphine yesterday at 1456 and 5 mg at 0238. The patient had acute respiratory distress with respiratory rate in the 30s, diaphoresis, and dyspnea. She was treated with 5 mg morphine at 0743. When they saw her, she was lying with her eyes closed with slight tachypnea and accessory muscle usage. She was able to open her eyes and answer questions. She required assistance to sit up in bed. Her back was diaphoretic. She had decent airflow with wheezing expiratory throughout. She was alert and oriented. Her lower extremities were cold to the touch that she had +2 DP pulses bilaterally. She had no lower extremity edema. She overall appeared fatigued. She was on 3 L and was satting 100%. We turned this down so that we could maintain an O2 sat of 88% to 95% as she has a known CO2 retainer. A repeat chest x-ray was unchanged from admission and in particular did not show any pulmonary edema. She had gotten IV fluids yesterday due to hyponatremia with a sodium of 125. We spoke with her family and assured them that if there is anything reversible that we would reverse it but we doubted that she had any reversible processes going on. It appears that palliative care has also spoken with the family since her visit and that she is now DNR. We will need to speak with them regarding any further decisions regarding aggressive care and SNF placement versus focusing on symptom management. Her overall prognosis is poor and they symptom management focus would be beneficial.     Burns SpainButcher, Elmo Shumard  A, MD 03/26/2017, 2:40 PM

## 2017-03-26 NOTE — Progress Notes (Signed)
Daily Progress Note   Patient Name: [name]       Date: 03/26/2017 DOB: 02/20/1946  Age: 71 y.o. MRN#: [id number] Attending Physician: Bartholomew Crews, MD Primary Care Physician: Axel Filler, MD Admit Date: 03/21/2017  Reason for Consultation/Follow-up: Establishing goals of care, Non pain symptom management and Psychosocial/spiritual support  Subjective: [name] has suffered a progressive decline in her respiratory status over the past day and a half. She has significant tachypnea, fatigue, and anxiety. PRN medication has provided some relief, but it has caused her to be more drowsy and not alleviated enough of her dyspnea. Her family is at the bedside--brother, son, and son's wife--and they are extremely upset about her obvious suffering.    Length of Stay: 4  Current Medications: Scheduled Meds:  . apixaban  5 mg Oral BID  . escitalopram  10 mg Oral Daily  . feeding supplement (ENSURE ENLIVE)  237 mL Oral TID BM  . ipratropium-albuterol  3 mL Nebulization Q4H  . mouth rinse  15 mL Mouth Rinse BID  . methylPREDNISolone (SOLU-MEDROL) injection  40 mg Intravenous Q12H  . metoprolol succinate  75 mg Oral Daily  . mometasone-formoterol  2 puff Inhalation BID  .  morphine injection  0.5 mg Intravenous Once    Continuous Infusions: . morphine      PRN Meds: albuterol, ALPRAZolam, guaiFENesin-dextromethorphan, morphine  Physical Exam  Constitutional: She appears lethargic. She has a sickly appearance. Nasal cannula in place.  Thin and fail woman in obvious respiratory distress in bed  HENT:  Head: Normocephalic and atraumatic.  Mouth/Throat: Mucous membranes are dry. Abnormal dentition.  Eyes: EOM are normal.  Neck: Normal range of motion. Neck supple.  Cardiovascular: Normal rate and regular rhythm.     Pulmonary/Chest:  Significant work of breathing with RR 32. Poor airflow with rapid shallow breaths. Expiratory wheezing present.   Abdominal: Soft. Bowel sounds are normal.  Musculoskeletal: Normal range of motion. She exhibits no edema.  Neurological: She appears lethargic.  Mild confusion and lethargy.   Skin: Skin is warm and dry. There is pallor.  Psychiatric: Thought content normal. Her mood appears anxious. Her speech is delayed. She is withdrawn. Cognition and memory are normal. She expresses impulsivity and inappropriate judgment.           Vital Signs: BP (!) 150/52 (BP Location: Left Arm)   Pulse 81   Temp 98.6 F (37 C) (Axillary)   Resp (!) 32   Ht '5\' 1"'$  (1.549 m)   Wt 53.8 kg (118 lb 9.7 oz)   SpO2 97%   BMI 22.41 kg/m  SpO2: SpO2: 97 % O2 Device: O2 Device: Nasal Cannula O2 Flow Rate: O2 Flow Rate (L/min): 2 L/min  Intake/output summary:  Intake/Output Summary (Last 24 hours) at 03/26/17 1404 Last data filed at 03/26/17 1000  Gross per 24 hour  Intake              150 ml  Output              525 ml  Net             -375 ml   LBM: Last BM Date: 03/24/17  Baseline Weight: Weight: 53 kg (116 lb 13.5 oz) Most recent weight: Weight: 53.8 kg (118 lb 9.7 oz)  Palliative Assessment/Data: PPS 40%, although approaching lower with increased respiratory burden.   Flowsheet Rows     Most Recent Value  Intake Tab  Referral Department  -- [internal medicine]  Unit at Time of Referral  Med/Surg Unit  Palliative Care Primary Diagnosis  Pulmonary  Date Notified  03/22/17  Palliative Care Type  New Palliative care  Reason for referral  Clarify Goals of Care, Non-pain Symptom  Date of Admission  03/21/17  # of days IP prior to Palliative referral  1  Clinical Assessment  Psychosocial & Spiritual Assessment  Palliative Care Outcomes      Patient Active Problem List   Diagnosis Date Noted  . Shortness of breath   . Palliative care by specialist   . DNR (do not  resuscitate) discussion   . COPD exacerbation ([place]) 03/21/2017  . Pneumonia 11/27/2016  . Hyperglycemia 11/27/2016  . Anemia 11/27/2016  . Persistent atrial fibrillation ([place])   . Chronic respiratory failure with hypoxia and hypercapnia ([place]) 09/11/2016  . Acute on chronic respiratory failure with hypoxia ([place]) 11/25/2015  . Anxiety 11/25/2015  . Dependent edema 11/25/2015  . Tachycardia 11/25/2015  . Hyponatremia   . COPD with respiratory distress, acute ([place])   . COPD with acute exacerbation ([place])   . CAP (community acquired pneumonia) 03/13/2015  . Respiratory distress 03/13/2015  . Pain of left calf 03/16/2014  . Neck pain 02/12/2014  . Allergic conjunctivitis and rhinitis 10/04/2012  . Essential hypertension 03/21/2009  . Chronic respiratory failure with hypoxia ([place]) 10/14/2007  . CT, CHEST, ABNORMAL 10/14/2007  . Tobacco use disorder, severe, in sustained remission 09/12/2007  . 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 Fib, and HTN. She saw Dr. Chase Caller on 6/27 and was diagnosed with a COPD exacerbation and started on a steroid taper and antibiotics. Unfortunately, her symptoms persisted and she presented to the ED. She was admitted on 7/1. Since admission she had a modest improvement in symptoms, with the plan for discharge to SNF. Then on 7/5 had acute worsening of symptoms again.   Assessment: [name] and her family had met with NP Davis Gourd from my team earlier in her admission. They had good conversations about the natural trajectory of COPD and advanced directives were discussed.  They had last met with her on 7/4 with the plan for Full code and transition home.  I was called to the bedside today to assist in symptom management and clarifying goals of care in light of [name] decline over the prior day and a half. On my arrival she had  marked tachypnea with obvious increased work of breathing. She was also moaning, restless in bed, and visibly anxious. She vocalized that she couldn't breath and asked me to make it better. Her family at the bedside were tearful and very upset about her deterioration.  I sat down with them and did a health review. Importantly, they relate that she had pneumonia in March that she has never fully recovered from. She has been getting weaker and her breathing seems to be worsening despite medication and rehab. They feel like she has not improved at all since admission and she has continued to deteriorate. She and  they fell like she is suffering.   I reviewed the results of the CXR with them and reinforced their understanding of COPD and the risk for exacerbations and decline. I also emphasized her weakened state and the trajectory of decline since March. When asking her what is most important at this point, she is clear that she wants to be comfortable. I discussed options for comfort, which could included a continuous drip of medication (morphine) to help ease her symptoms. I also reviewed the potential side effects of the drip. She did want to try it. Her family was also very clear that comfort was the priority at this point as she was so clearly suffering.   Finally, I did address code status. After extensive conversation and input from her family, she was clear on not wanting aggressive interventions to prolong her life in the event of cardiac or respiratory failure. Her family was tearful but fully agreed with this decision. I did explain this would mean allowing her to die a natural death, with no CPR, defibrillation, or intubation.   Recommendations/Plan:  DNR  Comfort focused care (NOT COMFORT CARE ONLY). I have ordered a continuous morphine infusion with PRN boluses and spoken with the primary team about the need for IV steroids (which I see they have ordered). I would leave her other medications in  place and give as tolerated. While the goal is ensuring comfort, they would like to continue to treat the treatable but not escalate care. The patient and her family understand this may be the end and don't want her to suffer as they treat the treatable.   Palliative to continue to follow, support, and aid in symptom management  Code Status:  DNR  Prognosis:   < 2 weeks. Pt has end stage COPD with an acute exacerbation poorly responding to steroids. I believe she is tiring and her time is likely limited.   Discharge Planning:  To Be Determined.   Care plan was discussed with pt, pt's son, daughter-in-law, brother, care nurse, RT, and primary team.  Thank you for allowing the Palliative Medicine Team to assist in the care of this patient.  Time in: 1300 Time out: 1400  Total time: 120 minutes    Greater than 50%  of this time was spent counseling and coordinating care related to the above assessment and plan.  Charlynn Court, NP Palliative Medicine Team [phone] pager (7a-5p) Team Phone # [phone]

## 2017-03-26 NOTE — Care Management Important Message (Signed)
Important Message  Patient Details  Name: [name] MRN: [id number] Date of Birth: Jun 05, 1946   Medicare Important Message Given:  Yes    Dorena BodoIris Hailea Eaglin 03/26/2017, 12:22 PM

## 2017-03-26 NOTE — Progress Notes (Signed)
   Subjective:  Mrs [name] status deteriorated earlier this morning. A page was received from nursing regarding an episode of increased air hunger, respirations in to the 30s, and increased BP. Nursing stated that she improved with Morphine and Breathing treatments, but was still looking worse than she was previously.  The patient was seen in her room with family present. She was more somnolent than on previous visit. She did wake and answer questions. Her oxygen was adjust until her saturation read between 88 and 95 in order to ensure she was not retaining CO2. Because of the deterioration in her status a repeat CXR was obtained to rule out possible concomitant processes.  The nurse later contacted us with continued concerned for the patients status. When revisiting the patient to inform her of her negative CXR I encountered palliative care who had just had a discussion with the patient and her family. Mrs. [name] has decided to change her Code Status to DNR and pursue comfort care only going forward.  Objective:  Vital signs in last 24 hours: Vitals:   03/26/17 0800 03/26/17 1000 03/26/17 1100 03/26/17 1251  BP: (!) 129/38 (!) 150/52    Pulse: 85 81    Resp:      Temp:      TempSrc:      SpO2: 94% (!) 88% 99% 97%  Weight:      Height:       Physical Exam  Constitutional: She is oriented to person, place, and time. She appears well-developed.  Moderately Distressed  HENT:  Head: Normocephalic.  Mouth/Throat: Oropharynx is clear and moist.  Eyes: EOM are normal. No scleral icterus.  Cardiovascular: Normal rate and regular rhythm.  Exam reveals no gallop and no friction rub.   No murmur heard. Pulmonary/Chest: She has wheezes. She has no rales.  Moderate Respiratory Distress, Distant Breath Sounds  Abdominal: Soft. Bowel sounds are normal. She exhibits no distension. There is no tenderness.  Musculoskeletal: She exhibits no edema or deformity.  Neurological: She is alert and oriented  to person, place, and time.  Skin: Skin is dry. No rash noted. No erythema.  Cool L and R distal lower extremities   Assessment/Plan:  Active Problems:   COPD exacerbation (HCC)   Shortness of breath   Palliative care by specialist   DNR (do not resuscitate) discussion  71 year old female with a history of Severe COPD, Atrial Fibrillation and Hypertension presented with 1 week of increasing shortness of breath and cough with sputum production.  Palliative Care - Per Palliative Care Team Recommendations  Servere COPD with recent failed outpatient treatment for COPD exacerbation - Acute exacerbation resolved - Baseline COPD very severe - Palliative care consult:  - Discuss patients goals again today and the patient has opted for comfort care only. - 5mg  Morphine Oral PRN q4H for SOB - Azithromycin - Duonebs q6H - Methylprednisolone 40q12 - Dulera (mometasone-formoterol) - Albuterol neb q4H PRN - Robitussin q4H PRN  A-fib - Continue home Metoprolol succinate 75mg  - Continue eliquis 5mg   Anxiety  - Xanax PRN  - Home Lexipro started  F/E/N: Regular Diet, Feeding Supplement  DVT Prophylaxis: On Eliquis   Code Status: Full Code  Dispo: Anticipated discharge to Hospice pending Palliative care recommendations.   Ginette OttoAlec Melvin, DO IM PGY-1 Pager: [phone]

## 2017-03-26 NOTE — Progress Notes (Signed)
Asked by staff to place patient on radar list with increasing WOB and some periods of O2 desaturations.  Saw patient on rounds.  Moderate distress post medication for pain and anxiety - pursed lip breathing - O2 sats now at 94% on baseline O2 at 3 liters.  Family at bedside.  Placed on continuous O2 sat monitor by International Business Machinesbigail RN.  Consulted with ComptrollerAbigail RN after chart review - she states patient is different today - suggested updating palliative care team and medical team with changes and concerns regarding code status and goals of care.   Will follow as needed.

## 2017-03-26 NOTE — Clinical Social Work Note (Addendum)
CSW has been updated regarding patient medical status and change to DNR. CSW advised that patient will not discharge today and per MD, "We will need to speak with them (family) regarding any further decisions regarding aggressive care and SNF placement versus focusing on symptom management. Her overall prognosis is poor and they symptom management focus would be beneficial. changes will be made to some medications."  Judeth CornfieldStephanie, admissions staff person with [facility] [phone]) contacted and informed that patient is not discharging today.  Authorization was received from [employer] [id number](#[id number] eff. For 3 days) earlier today. CSW talked at 4:26 pm with Angelic with [employer] regarding change in patient's medial status and no discharge today. Angelic informed that d/c date from hospital is unknown at this time, and it was decided for insurance purposes that the authorization date will be made effective Saturday, 03/27/17. If patient does not discharge over the weekend, CSW will call Angelic [phone], [phone]. [phone]) on Monday to provide update.  Genelle BalVanessa Mann Skaggs, MSW, LCSW Licensed Clinical Social Worker Clinical Social Work Department [facility] [phone]

## 2017-03-26 NOTE — Progress Notes (Signed)
Internal Medicine resident contacted and made aware of patient's distress this am. Respirations in 30s, diaphoretic, states that she can't breathe and asking about a ventilator. Lung sounds diminished and expiratory wheezes. Given oral morphine and xanax and duo neb. Pt respirations now in mid 20s and appears more relaxed. Family at bedside. Concerned about placement to SNF. Nurse requesting team to discuss more about hospice with patient and family.

## 2017-03-26 NOTE — Progress Notes (Signed)
OT Cancellation Note  Patient Details Name: Kaitlyn MoralesFrances L Ibarra MRN: [id number] DOB: [birth year]   Cancelled Treatment:    Reason Eval/Treat Not Completed: Medical issues which prohibited therapy; spoke with RN, RN reporting Pt not doing well today, starting new meds, requesting OT hold treatment session for today. Will follow.   Marcy SirenBreanna Miria Cappelli, OT Pager [phone] 03/26/2017   [name] 03/26/2017, 2:33 PM

## 2017-03-27 DIAGNOSIS — E871 Hypo-osmolality and hyponatremia: Secondary | ICD-10-CM

## 2017-03-27 DIAGNOSIS — Z515 Encounter for palliative care: Secondary | ICD-10-CM

## 2017-03-27 DIAGNOSIS — D72829 Elevated white blood cell count, unspecified: Secondary | ICD-10-CM

## 2017-03-27 DIAGNOSIS — J441 Chronic obstructive pulmonary disease with (acute) exacerbation: Secondary | ICD-10-CM

## 2017-03-27 LAB — GLUCOSE, CAPILLARY
Glucose-Capillary: 158 mg/dL — ABNORMAL HIGH (ref 65–99)
Glucose-Capillary: 155 mg/dL — ABNORMAL HIGH (ref 65–99)

## 2017-03-27 MED ORDER — LORAZEPAM 2 MG/ML IJ SOLN
0.5000 mg | INTRAMUSCULAR | Status: DC | PRN
  Administered 2017-03-27: 0.5 mg via INTRAVENOUS
  Filled 2017-03-27: qty 1

## 2017-03-27 MED ORDER — MORPHINE BOLUS VIA INFUSION
1.0000 mg | INTRAVENOUS | Status: DC | PRN
  Administered 2017-03-27 – 2017-03-28 (×4): 1 mg via INTRAVENOUS
  Filled 2017-03-27: qty 1

## 2017-03-27 MED ORDER — DEXAMETHASONE SODIUM PHOSPHATE 10 MG/ML IJ SOLN
6.0000 mg | Freq: Every day | INTRAMUSCULAR | Status: DC
  Administered 2017-03-27 – 2017-03-28 (×2): 6 mg via INTRAVENOUS
  Filled 2017-03-27 (×2): qty 0.6

## 2017-03-27 NOTE — Progress Notes (Signed)
Daily Progress Note   Patient Name: [name]       Date: 03/27/2017 DOB: Jan 28, 1946  Age: 71 y.o. MRN#: [id number] Attending Physician: Bartholomew Crews, MD Primary Care Physician: Axel Filler, MD Admit Date: 03/21/2017  Reason for Consultation/Follow-up: Establishing goals of care, Inpatient hospice referral, Non pain symptom management, Psychosocial/spiritual support and Terminal Care  Subjective: Patient is less tachypneic since starting morphine continuous infusion. Per chart review, she has only received one bolus in the previous 24 hours. She appears quite ill, and states she feels so badly that "I just want to die". Met with her son Karma Greaser, and his wife, and discussed residential hospice which they were agreeable to. Discussed with patient and she was agreeable as well Length of Stay: 5  Current Medications: Scheduled Meds:  . dexamethasone  6 mg Intravenous Daily  . escitalopram  10 mg Oral Daily  . feeding supplement (ENSURE ENLIVE)  237 mL Oral TID BM  . mouth rinse  15 mL Mouth Rinse BID  . metoprolol succinate  75 mg Oral Daily    Continuous Infusions: . morphine 1 mg/hr (03/26/17 1503)    PRN Meds: albuterol, ALPRAZolam, guaiFENesin-dextromethorphan, LORazepam, morphine  Physical Exam  Constitutional:  Older female who appears acutely ill, pale  Neck: Normal range of motion.  Cardiovascular:  Tachycardic  Pulmonary/Chest:  Mild increased work of breathing at rest  Musculoskeletal: Normal range of motion.  Neurological:  Somnolent but easily awakens to voice and can answer simple questions yes or no  Skin:  Cool; faint mottling to knees and feet  Psychiatric:  Withdrawn; states "I just want to die"  Nursing note and vitals reviewed.    Vital Signs: BP (!) 175/65 (BP Location: Left Arm) Comment: notified [place]  Pulse 72   Temp 98.6 F (37 C) (Oral)   Resp 20   Ht '5\' 1"'$  (1.549 m)   Wt 52.1 kg (114 lb 14.4 oz)   SpO2 (!) 89%   BMI 21.71 kg/m  SpO2: SpO2: (!) 89 % O2 Device: O2 Device: Nasal Cannula O2 Flow Rate: O2 Flow Rate (L/min): 2 L/min  Intake/output summary:  Intake/Output Summary (Last 24 hours) at 03/27/17 1243 Last data filed at 03/27/17 1038  Gross per 24 hour  Intake  554.95 ml  Output              825 ml  Net          -270.05 ml   LBM: Last BM Date: 03/24/17 Baseline Weight: Weight: 53 kg (116 lb 13.5 oz) Most recent weight: Weight: 52.1 kg (114 lb 14.4 oz)       Palliative Assessment/Data:    Flowsheet Rows     Most Recent Value  Intake Tab  Referral Department  -- [internal medicine]  Unit at Time of Referral  Med/Surg Unit  Palliative Care Primary Diagnosis  Pulmonary  Date Notified  03/22/17  Palliative Care Type  New Palliative care  Reason for referral  Clarify Goals of Care, Non-pain Symptom  Date of Admission  03/21/17  # of days IP prior to Palliative referral  1  Clinical Assessment  Psychosocial & Spiritual Assessment  Palliative Care Outcomes      Patient Active Problem List   Diagnosis Date Noted  . Goals of care, counseling/discussion   . Shortness of breath   . Palliative care by specialist   . DNR (do not resuscitate) discussion   . COPD exacerbation ([place]) 03/21/2017  . Pneumonia 11/27/2016  . Hyperglycemia 11/27/2016  . Anemia 11/27/2016  . Persistent atrial fibrillation ([place])   . Chronic respiratory failure with hypoxia and hypercapnia ([place]) 09/11/2016  . Acute on chronic respiratory failure with hypoxia ([place]) 11/25/2015  . Anxiety 11/25/2015  . Dependent edema 11/25/2015  . Tachycardia 11/25/2015  . Hyponatremia   . COPD with respiratory distress, acute ([place])   . COPD with acute exacerbation ([place])   . CAP (community acquired pneumonia)  03/13/2015  . Respiratory distress 03/13/2015  . Pain of left calf 03/16/2014  . Neck pain 02/12/2014  . Allergic conjunctivitis and rhinitis 10/04/2012  . Essential hypertension 03/21/2009  . Chronic respiratory failure with hypoxia ([place]) 10/14/2007  . CT, CHEST, ABNORMAL 10/14/2007  . Tobacco use disorder, severe, in sustained remission 09/12/2007  . COPD GOLD III / 02 dep  09/12/2007    Palliative Care Assessment & Plan   Patient Profile: Pt is a 71yo female with a past medical history of COPD Gold D on 3L O2 at baseline, A. Fib, and HTN. She saw Dr. Chase Caller on 6/27 and was diagnosed with a COPD exacerbation and started on a steroid taper and antibiotics. Unfortunately, her symptoms persisted and she presented to the ED. She was admitted on 7/1. Since admission she had a modest improvement in symptoms, with the plan for discharge to SNF. Then on 7/5 had acute worsening of symptoms again.  She has since been started on a morphine continuous infusion at 1 mg an hour and 0.5 mg every 30 minutes as needed for shortness of breath. She still feels uncomfortable and is exhibiting mild increased work of breathing at rest but is not tachypneic    Recommendations/Plan:  Dyspnea: We'll continue morphine continuous infusion at 1 mg an hour and will increase bolus frequency to 1 mg every 20 minutes as needed for shortness of breath and pain. We'll DC Solu-Medrol and changed to Decadron 6 mg IV daily (residential hospices have Decadron available)  Transfer to residential hospice. Offered choice of hospice as per Medicare guidelines. Family has opted for hospice of the [place], specificl[facility] hospice home of [facility]. Called hosp[facility] liaison to initiate referral.  Goals of Care and Additional Recommendations:  Limitations on Scope of Treatment: Avoid Hospitalization, Minimize Medications, Initiate  Comfort Feeding, No Artificial Feeding, No Blood Transfusions, No  Chemotherapy, No Diagnostics, No Glucose Monitoring, No Hemodialysis, No IV Antibiotics, No IV Fluids, No Lab Draws, No Radiation, No Surgical Procedures and No Tracheostomy  Code Status:    Code Status Orders        Start     Ordered   03/26/17 1356  Do not attempt resuscitation (DNR)  Continuous    Question Answer Comment  In the event of cardiac or respiratory ARREST Do not call a "code blue"   In the event of cardiac or respiratory ARREST Do not perform Intubation, CPR, defibrillation or ACLS   In the event of cardiac or respiratory ARREST Use medication by any route, position, wound care, and other measures to relive pain and suffering. May use oxygen, suction and manual treatment of airway obstruction as needed for comfort.      03/26/17 1356    Code Status History    Date Active Date Inactive Code Status Order ID Comments User Context   03/21/2017 10:47 AM 03/26/2017  1:56 PM Full Code [id number]  Collier Salina, MD Inpatient   11/27/2016  1:57 AM 12/03/2016  7:37 PM Full Code [id number]  Karmen Bongo, MD Inpatient   11/25/2015  2:43 PM 12/02/2015  4:59 PM Full Code [id number]  Waldemar Dickens, MD ED   03/13/2015 12:39 PM 03/16/2015  3:28 PM Full Code [id number]  Jones Bales, MD Inpatient   10/08/2011  8:10 PM 10/12/2011  8:30 PM Full Code [id number]  Roberto Scales, RN ED       Prognosis:   < 2 weeks in the setting of end-stage COPD, atrial fibrillation, hypertension, with acute decompensation over the past 7 day.. Now eating only bites and sips and symptomatically short of breath on morphine continuous infusion  Discharge Planning:  [place] was discussed with internal medicine resident  Thank you for allowing the Palliative Medicine Team to assist in the care of this patient.   Time In: 1000 Time Out: 1045 Total Time 45 min Prolonged Time Billed  no       Greater than 50%  of this time was spent counseling and coordinating care related to the  above assessment and plan.  Dory Horn, NP  Please contact Palliative Medicine Team phone at [phone] for questions and concerns.

## 2017-03-27 NOTE — Clinical Social Work Note (Signed)
Pt family confirmed [facility] the [facility] [place] for choice. CSW spoke with Nelson ChimesDiana Stevens [phone] who stated she would meet with the family later on today. [facility] stated cannot accept pt today, but possibly Sunday. CSW will continue to follow for family support and discharge needs.   Corlis HoveJeneya Damione Robideau, LCSWA, LCASA Clinical Social Work [phone]

## 2017-03-27 NOTE — Progress Notes (Signed)
   Subjective: [name] continues to struggle this morning with ongoing respiratory distress and high work of breathing. She remains unable to eat meals besides a few bites or maybe a shake with her severe dyspnea. There has been slight relief with continuous morphine infusion although she still obviously uncomfortable. Her symptoms are not well relieved with as needed nebulizer treatments and she even wonders if she wants to continue receiving these.  Objective:  Vital signs in last 24 hours: Vitals:   03/26/17 2015 03/27/17 0628 03/27/17 0729 03/27/17 0731  BP: (!) 169/66 (!) 175/65    Pulse: 96 72    Resp: (!) 22 20    Temp: (!) 97.3 F (36.3 C) 98.6 F (37 C)    TempSrc: Oral Oral    SpO2: (!) 88% 91% (!) 89% (!) 89%  Weight: 114 lb 14.4 oz (52.1 kg)     Height:       Physical Exam  Constitutional: She is oriented to person, place, and time. She appears well-developed.  Moderately Distressed  HENT:  Head: Normocephalic.  Mouth/Throat: Oropharynx is clear and moist.  Eyes: EOM are normal. No scleral icterus.  Cardiovascular: Normal rate and regular rhythm.  Exam reveals no gallop and no friction rub.   No murmur heard. Pulmonary/Chest: She has wheezes. She has no rales.  Moderate Respiratory Distress, Distant Breath Sounds  Abdominal: Soft. Bowel sounds are normal. She exhibits no distension. There is no tenderness.  Musculoskeletal: She exhibits no edema or deformity.  Neurological: She is alert and oriented to person, place, and time.  Skin: Skin is dry. No rash noted. No erythema.  Cool L and R distal lower extremities   Assessment/Plan:  71 year old female with a history of Severe COPD, Atrial Fibrillation and Hypertension presented with 1 week of increasing shortness of breath and cough with sputum production.  End stage COPD with recent failed outpatient treatment Hypoxic respiratory failure End of life goals of care discussion I discussed with [name] and  her son at the bedside today that her clinical course was worsening despite all of our interventions particularly form 7/5 onward and it seemed decreasingly likely she would be able to tolerate rehabilitation or that she has the desire to do so. They are amenable to switching our focus to symptom management with the understanding this is a life limiting illness. Her disposition is now for comfort care here with an anticipation of residential hospice placement and the palliative care service's assistance is much appreciated in this. We are increasing her available morphine for continued distress at the current dosing.  A-fib I think we ought to continue rate control with metoprolol since decompensation could precipate acute worsening of dyspnea. I do not see any reason to continue anticoagulation at this time.  Anxiety Continue lexapro and xanax PRN  F/E/N: Regular food and nutritional shake supplements as requested DVT Prophylaxis: D/C as she is now comfort care anticipating residential hospice DNR/DNI  Dispo: Anticipated discharge to Hospice sometime in the next few days  Fuller Planhristopher W Rice, MD [facility] Internal Medicine Resident Pager# [phone] 03/27/2017, 12:27 PM

## 2017-03-28 MED ORDER — MORPHINE SULFATE (PF) 2 MG/ML IV SOLN: 2.0000 mg | INTRAVENOUS | 0 refills | Status: AC | PRN

## 2017-03-28 MED ORDER — LORAZEPAM 2 MG/ML IJ SOLN: 0.5000 mg | INTRAMUSCULAR | 0 refills | Status: AC | PRN

## 2017-03-28 MED ORDER — SODIUM CHLORIDE 0.9 % IV SOLN: 1.0000 mg/h | INTRAVENOUS | Status: AC

## 2017-03-28 MED ORDER — DEXAMETHASONE SODIUM PHOSPHATE 10 MG/ML IJ SOLN: 6.0000 mg | Freq: Every day | INTRAMUSCULAR | 0 refills | Status: AC

## 2017-03-28 MED ORDER — MORPHINE SULFATE (PF) 2 MG/ML IV SOLN
2.0000 mg | INTRAVENOUS | Status: DC | PRN
  Administered 2017-03-28: 2 mg via INTRAVENOUS
  Filled 2017-03-28: qty 1

## 2017-03-28 NOTE — Consult Note (Signed)
[facility] Referral received for Hospice Home at [facility] on 03/27/17 from [place], [place]. Met with the patient's son, Jillann Charette, who confirmed desire for end of life care at [facility]. Records and current clinical status reviewed by hospice provider and the patient was approved for Hospice Home. A bed is available today. Please prepare an out of facility DNR for transport. Thank you for this referral.  Yetta Glassman RN [place] [facility] [place] [phone]

## 2017-03-28 NOTE — Clinical Social Work Note (Signed)
Pt is ready for discharge today and going to [facility]. Family aware and in agreement with discharge plan. [name] met with family to complete paperwork and confirmed bed available. D/C summary seen by [name]. CSW contacted [facility] to arrange transportation. Signed DNR on chart. CSW signing off as no further Social Work needs identified.   [name], [place], [place] Work [name] coverage) [phone]

## 2017-03-28 NOTE — Discharge Summary (Signed)
Name: [name] MRN: [id number] DOB: 11/06/1945 71 y.o. PCP: [name], Kaitlyn Thomas, MD  Date of Admission: 03/21/2017  3:39 AM Date of Discharge: 03/28/2017 Attending Physician: Kaitlyn PoissonKlima, Lawrence, MD  Discharge Diagnosis: Active Problems:   COPD exacerbation (HCC)   Shortness of breath   Palliative care by specialist   DNR (do not resuscitate) discussion   Goals of care, counseling/discussion   Leukocytosis   Discharge Medications: Allergies as of 03/28/2017      Reactions   Bystolic [nebivolol Hcl]    Was with ++ itching, improved after stopping Bystolic      Medication List    STOP taking these medications   ALPRAZolam 0.5 MG tablet Commonly known as:  XANAX   apixaban 5 MG Tabs tablet Commonly known as:  ELIQUIS   budesonide-formoterol 160-4.5 MCG/ACT inhaler Commonly known as:  SYMBICORT   CALCIUM PO   furosemide 20 MG tablet Commonly known as:  LASIX   guaiFENesin-dextromethorphan 100-10 MG/5ML syrup Commonly known as:  ROBITUSSIN DM   ipratropium-albuterol 0.5-2.5 (3) MG/3ML Soln Commonly known as:  DUONEB   lisinopril 20 MG tablet Commonly known as:  PRINIVIL,ZESTRIL   PARoxetine 10 MG tablet Commonly known as:  PAXIL   predniSONE 10 MG tablet Commonly known as:  DELTASONE     TAKE these medications   albuterol 108 (90 Base) MCG/ACT inhaler Commonly known as:  PROVENTIL HFA;VENTOLIN HFA Inhale 2 puffs into the lungs every 6 (six) hours as needed for wheezing or shortness of breath. Please dispense generic.   dexamethasone 10 MG/ML injection Commonly known as:  DECADRON Inject 0.6 mLs (6 mg total) into the vein daily. Start taking on:  03/29/2017   LORazepam 2 MG/ML injection Commonly known as:  ATIVAN Inject 0.25 mLs (0.5 mg total) into the vein every 4 (four) hours as needed for anxiety.   metoprolol succinate 25 MG 24 hr tablet Commonly known as:  TOPROL-XL Take 3 tablets (75 mg total) by mouth daily. Take with or immediately  following a meal.   morphine 2 MG/ML injection Inject 1-2 mLs (2-4 mg total) into the vein every hour as needed (dyspnea).   morphine 250 mg in sodium chloride 0.9 % 250 mL Inject 1 mg/hr into the vein continuous.       Disposition and follow-up:   [name] in End of life condition.  At the hospital follow up visit please address:  Comfort care for end stage COPD with life limiting exacerbation. Please continue treating her symptoms as able.  Follow-up Appointments:   Hospital Course by problem list:  COPD exacerbation [name] was initially admitted without hypoxia but significantly increased work of breathing that had not improved after 5 days of outpatient treatment with prednisone and azithromycin. Chest xray did not show any focal infiltrate suggesting a pneumonia. There was minimal response to IV therapies here and she continued to decline, particularly worsening on Thursday, July 5 was unable to tolerate any significant exertion or make progress towards rehabilitation. Palliative care was consulted to discuss the goals of care with [name] and her son, considering this appeared to be all worsening of her chronic respiratory disease with no new reversible process. We began treatment with morphine for symptomatic relief of her dyspnea but were unable to improve any exertional tolerance or ability to tolerate meals. She was transitioned to fully comfort care with plans for inpatient hospice setting for control of her severe symptoms particular dyspnea  and anxiety.  Hyponatremia Skin had hyponatremia due to severely diminished oral intake before and during this hospitalization. We were initially managing this with IV fluids discontinued this as our goal was change more towards symptom management.  Anxiety She was initially switched to Lexapro and Xanax when necessary for anxiety. This is now changed to IV lorazepam as  needed.  Atrial fibrillation She was well controlled on metoprolol 75 mg daily and anticoagulated on eloquent. She has extensive bruising exacerbated by her eloquent is plus multiple steroid treatments. This was discontinued after the decision made to transfer to comfort care.  Discharge Vitals:   BP (!) 160/51 (BP Location: Right Arm)   Pulse 75   Temp 97.6 F (36.4 C) (Oral)   Resp 18   Ht 5\' 1"  (1.549 m)   Wt 117 lb (53.1 kg)   SpO2 98%   BMI 22.11 kg/m   Pertinent Labs, Studies, and Procedures:  CXR 7/2 IMPRESSION: COPD. Mild interstitial prominence likely reflects the patient's smoking history and there may be superimposed acute bronchitis. There is no alveolar pneumonia nor CHF.  Discharge Instructions: Discharge Instructions    Diet general    Complete by:  As directed       Signed: Fuller Plan, MD 03/28/2017, 1:44 PM   Pager: @MYPAGER @

## 2017-03-28 NOTE — Progress Notes (Addendum)
   Subjective: [name] appears uncomfortable this morning with high work of breathing although she is awake and happy to have her family members present. She remains unable to eat any significant amount of food but is sipping water and coffee.  Objective:  Vital signs in last 24 hours: Vitals:   03/27/17 1700 03/27/17 2036 03/28/17 0438 03/28/17 0840  BP: (!) 161/72 (!) 171/71 (!) 184/52 (!) 160/51  Pulse:  82 80 75  Resp: 20 18 18 18   Temp: 98.4 F (36.9 C) 98.3 F (36.8 C) 98.3 F (36.8 C) 97.6 F (36.4 C)  TempSrc: Oral Oral Oral Oral  SpO2: 95% (!) 85% 97% 98%  Weight:  117 lb (53.1 kg)    Height:       Physical Exam  Constitutional:  Ill appearing woman, sleepy, uncomfortable appearing  Eyes:  Constricted pupils b/l  Pulmonary/Chest: She has wheezes. She has rales.  High work of breathing, accessory muscle use, loud wheezing  Musculoskeletal: She exhibits no edema.  Neurological:  Sleepy but arousable to voice  Skin:  Cool L and R distal lower extremities, large bruising on forearm IV sites and some scattered bruises on legs   Assessment/Plan:  71 year old female with a history of Severe COPD, Atrial Fibrillation and Hypertension presented with 1 week of increasing shortness of breath and cough with sputum production.  End stage COPD with recent failed outpatient treatment Hypoxic respiratory failure End of life care [name] appears to be declining quickly and is obviously in discomfort despite continuous morphine infusion with increased bolus dosing. Her brother, sister in law, and son are all here with grandson anticipated to visit later. Our plan is continue treatment fully focused on comfort and symptom relief with discharge to a residential hospice facility this afternoon. The palliative care service and hospice staff assistance are greatly appreciated for help in this.  A-fib We can continue rate control with toprol if tolerating pills since decompensate  afib could worsen anxiety.  Anxiety She will continue lorazepam IV PRN.  F/E/N: She can have regular food or liquids as desired DVT Prophylaxis: none DNR/DNI  Dispo: Discharge to residential hospice later today.  Fuller Planhristopher W Rice, MD PGY-III Internal Medicine Resident Pager# [phone] 03/28/2017, 1:29 PM

## 2017-03-28 NOTE — Progress Notes (Signed)
Morphine IV 225 ml wasted into the sink as witnessed by [employer] Corporation[employer]isha Mabe R.N.

## 2017-03-28 NOTE — Progress Notes (Signed)
[facility]

## 2017-04-19 ENCOUNTER — Encounter: Payer: Medicare HMO | Admitting: Student in an Organized Health Care Education/Training Program

## 2017-04-21 DEATH — deceased

## 2017-06-07 ENCOUNTER — Ambulatory Visit: Payer: Medicare HMO | Admitting: Internal Medicine
